# Patient Record
Sex: Female | Born: 1965 | Race: Black or African American | Hispanic: No | Marital: Married | Smoking: Current every day smoker
Health system: Southern US, Community
[De-identification: ages and names within clinical notes are randomized; demographics above are authoritative.]

## PROBLEM LIST (undated history)

## (undated) DIAGNOSIS — D259 Leiomyoma of uterus, unspecified: Secondary | ICD-10-CM

## (undated) DIAGNOSIS — G4733 Obstructive sleep apnea (adult) (pediatric): Secondary | ICD-10-CM

## (undated) DIAGNOSIS — I5030 Unspecified diastolic (congestive) heart failure: Secondary | ICD-10-CM

## (undated) DIAGNOSIS — I10 Essential (primary) hypertension: Secondary | ICD-10-CM

## (undated) DIAGNOSIS — K861 Other chronic pancreatitis: Secondary | ICD-10-CM

## (undated) DIAGNOSIS — R634 Abnormal weight loss: Secondary | ICD-10-CM

## (undated) DIAGNOSIS — R51 Headache: Secondary | ICD-10-CM

## (undated) DIAGNOSIS — D869 Sarcoidosis, unspecified: Secondary | ICD-10-CM

## (undated) DIAGNOSIS — E669 Obesity, unspecified: Secondary | ICD-10-CM

## (undated) DIAGNOSIS — D860 Sarcoidosis of lung: Secondary | ICD-10-CM

## (undated) DIAGNOSIS — E7800 Pure hypercholesterolemia, unspecified: Secondary | ICD-10-CM

## (undated) DIAGNOSIS — I2510 Atherosclerotic heart disease of native coronary artery without angina pectoris: Secondary | ICD-10-CM

## (undated) DIAGNOSIS — R0602 Shortness of breath: Secondary | ICD-10-CM

## (undated) DIAGNOSIS — Z800 Family history of malignant neoplasm of digestive organs: Secondary | ICD-10-CM

## (undated) DIAGNOSIS — E785 Hyperlipidemia, unspecified: Secondary | ICD-10-CM

## (undated) DIAGNOSIS — E119 Type 2 diabetes mellitus without complications: Secondary | ICD-10-CM

## (undated) DIAGNOSIS — I509 Heart failure, unspecified: Secondary | ICD-10-CM

## (undated) HISTORY — DX: Other chronic pancreatitis: K86.1

## (undated) HISTORY — DX: Pure hypercholesterolemia, unspecified: E78.00

## (undated) HISTORY — PX: CORONARY ARTERY BYPASS GRAFT: SHX141

## (undated) HISTORY — DX: Family history of malignant neoplasm of digestive organs: Z80.0

## (undated) HISTORY — DX: Sarcoidosis, unspecified: D86.9

## (undated) HISTORY — DX: Hyperlipidemia, unspecified: E78.5

## (undated) HISTORY — DX: Obesity, unspecified: E66.9

## (undated) HISTORY — PX: BREAST EXCISIONAL BIOPSY: SUR124

## (undated) HISTORY — DX: Abnormal weight loss: R63.4

## (undated) HISTORY — DX: Atherosclerotic heart disease of native coronary artery without angina pectoris: I25.10

## (undated) HISTORY — DX: Leiomyoma of uterus, unspecified: D25.9

## (undated) HISTORY — DX: Sarcoidosis of lung: D86.0

## (undated) HISTORY — PX: BREAST CYST ASPIRATION: SHX578

## (undated) HISTORY — DX: Unspecified diastolic (congestive) heart failure: I50.30

---

## 1995-03-27 HISTORY — PX: TUBAL LIGATION: SHX77

## 1998-09-25 DIAGNOSIS — I2510 Atherosclerotic heart disease of native coronary artery without angina pectoris: Secondary | ICD-10-CM | POA: Insufficient documentation

## 2003-02-09 ENCOUNTER — Other Ambulatory Visit: Payer: Self-pay

## 2003-02-10 ENCOUNTER — Other Ambulatory Visit: Payer: Self-pay

## 2003-03-27 DIAGNOSIS — E118 Type 2 diabetes mellitus with unspecified complications: Secondary | ICD-10-CM | POA: Insufficient documentation

## 2003-08-10 ENCOUNTER — Other Ambulatory Visit: Payer: Self-pay

## 2004-09-29 ENCOUNTER — Inpatient Hospital Stay: Payer: Self-pay | Admitting: Internal Medicine

## 2004-09-29 ENCOUNTER — Other Ambulatory Visit: Payer: Self-pay

## 2004-11-10 ENCOUNTER — Ambulatory Visit: Payer: Self-pay | Admitting: Cardiovascular Disease

## 2005-08-31 ENCOUNTER — Ambulatory Visit: Payer: Self-pay | Admitting: Oncology

## 2005-09-10 LAB — COMPREHENSIVE METABOLIC PANEL
ALT: 38 U/L (ref 0–40)
AST: 35 U/L (ref 0–37)
Albumin: 4 g/dL (ref 3.5–5.2)
BUN: 7 mg/dL (ref 6–23)
CO2: 25 mEq/L (ref 19–32)
Calcium: 10 mg/dL (ref 8.4–10.5)
Chloride: 100 mEq/L (ref 96–112)
Potassium: 2.8 mEq/L — ABNORMAL LOW (ref 3.5–5.3)

## 2005-09-10 LAB — LACTATE DEHYDROGENASE: LDH: 159 U/L (ref 94–250)

## 2005-09-10 LAB — CBC WITH DIFFERENTIAL (CANCER CENTER ONLY)
Eosinophils Absolute: 0.1 10*3/uL (ref 0.0–0.5)
LYMPH#: 2.1 10*3/uL (ref 0.9–3.3)
MONO#: 0.7 10*3/uL (ref 0.1–0.9)
NEUT#: 4.4 10*3/uL (ref 1.5–6.5)
Platelets: 220 10*3/uL (ref 145–400)
RBC: 5.58 10*6/uL — ABNORMAL HIGH (ref 3.70–5.32)
WBC: 7.7 10*3/uL (ref 3.9–10.0)

## 2005-09-20 ENCOUNTER — Ambulatory Visit: Payer: Self-pay | Admitting: Internal Medicine

## 2005-10-11 ENCOUNTER — Ambulatory Visit (HOSPITAL_COMMUNITY): Admission: RE | Admit: 2005-10-11 | Discharge: 2005-10-11 | Payer: Self-pay | Admitting: Thoracic Surgery

## 2005-10-11 ENCOUNTER — Encounter (INDEPENDENT_AMBULATORY_CARE_PROVIDER_SITE_OTHER): Payer: Self-pay | Admitting: *Deleted

## 2005-10-11 ENCOUNTER — Encounter (INDEPENDENT_AMBULATORY_CARE_PROVIDER_SITE_OTHER): Payer: Self-pay | Admitting: Specialist

## 2005-10-19 ENCOUNTER — Ambulatory Visit: Payer: Self-pay | Admitting: Oncology

## 2005-10-22 LAB — CBC WITH DIFFERENTIAL (CANCER CENTER ONLY)
BASO#: 0.1 10e3/uL (ref 0.0–0.2)
BASO%: 0.8 % (ref 0.0–2.0)
EOS%: 1.7 % (ref 0.0–7.0)
Eosinophils Absolute: 0.1 10e3/uL (ref 0.0–0.5)
HCT: 47 % — ABNORMAL HIGH (ref 34.8–46.6)
HGB: 15.6 g/dL (ref 11.6–15.9)
LYMPH#: 2 10e3/uL (ref 0.9–3.3)
LYMPH%: 33.6 % (ref 14.0–48.0)
MCH: 29.4 pg (ref 26.0–34.0)
MCHC: 33.3 g/dL (ref 32.0–36.0)
MCV: 88 fL (ref 81–101)
MONO#: 0.5 10e3/uL (ref 0.1–0.9)
MONO%: 8.2 % (ref 0.0–13.0)
NEUT#: 3.3 10e3/uL (ref 1.5–6.5)
NEUT%: 55.7 % (ref 39.6–80.0)
Platelets: 219 10e3/uL (ref 145–400)
RBC: 5.31 10e6/uL (ref 3.70–5.32)
RDW: 13.2 % (ref 10.5–14.6)
WBC: 5.9 10e3/uL (ref 3.9–10.0)

## 2005-10-22 LAB — COMPREHENSIVE METABOLIC PANEL WITH GFR
ALT: 45 U/L — ABNORMAL HIGH (ref 0–40)
AST: 36 U/L (ref 0–37)
Albumin: 4.3 g/dL (ref 3.5–5.2)
Alkaline Phosphatase: 58 U/L (ref 39–117)
BUN: 12 mg/dL (ref 6–23)
CO2: 24 meq/L (ref 19–32)
Calcium: 10.1 mg/dL (ref 8.4–10.5)
Chloride: 101 meq/L (ref 96–112)
Creatinine, Ser: 0.94 mg/dL (ref 0.40–1.20)
Glucose, Bld: 182 mg/dL — ABNORMAL HIGH (ref 70–99)
Potassium: 3.3 meq/L — ABNORMAL LOW (ref 3.5–5.3)
Sodium: 137 meq/L (ref 135–145)
Total Bilirubin: 0.6 mg/dL (ref 0.3–1.2)
Total Protein: 7.3 g/dL (ref 6.0–8.3)

## 2005-12-20 ENCOUNTER — Other Ambulatory Visit: Payer: Self-pay

## 2005-12-20 ENCOUNTER — Inpatient Hospital Stay: Payer: Self-pay | Admitting: Internal Medicine

## 2005-12-21 ENCOUNTER — Other Ambulatory Visit: Payer: Self-pay

## 2006-03-22 ENCOUNTER — Other Ambulatory Visit: Payer: Self-pay

## 2006-03-22 ENCOUNTER — Inpatient Hospital Stay: Payer: Self-pay | Admitting: Internal Medicine

## 2006-04-14 ENCOUNTER — Inpatient Hospital Stay: Payer: Self-pay | Admitting: Internal Medicine

## 2006-04-14 ENCOUNTER — Other Ambulatory Visit: Payer: Self-pay

## 2006-04-18 ENCOUNTER — Ambulatory Visit: Payer: Self-pay | Admitting: Oncology

## 2006-11-20 ENCOUNTER — Ambulatory Visit: Payer: Self-pay | Admitting: Internal Medicine

## 2006-12-07 ENCOUNTER — Emergency Department: Payer: Self-pay | Admitting: Emergency Medicine

## 2008-11-18 ENCOUNTER — Encounter (INDEPENDENT_AMBULATORY_CARE_PROVIDER_SITE_OTHER): Payer: Self-pay | Admitting: Nurse Practitioner

## 2008-12-02 ENCOUNTER — Encounter (INDEPENDENT_AMBULATORY_CARE_PROVIDER_SITE_OTHER): Payer: Self-pay | Admitting: Nurse Practitioner

## 2008-12-02 DIAGNOSIS — G4733 Obstructive sleep apnea (adult) (pediatric): Secondary | ICD-10-CM | POA: Insufficient documentation

## 2008-12-02 DIAGNOSIS — I519 Heart disease, unspecified: Secondary | ICD-10-CM | POA: Insufficient documentation

## 2008-12-10 ENCOUNTER — Ambulatory Visit: Payer: Self-pay | Admitting: Nurse Practitioner

## 2008-12-10 DIAGNOSIS — F17200 Nicotine dependence, unspecified, uncomplicated: Secondary | ICD-10-CM | POA: Insufficient documentation

## 2008-12-10 DIAGNOSIS — Z720 Tobacco use: Secondary | ICD-10-CM | POA: Insufficient documentation

## 2008-12-10 DIAGNOSIS — E669 Obesity, unspecified: Secondary | ICD-10-CM | POA: Insufficient documentation

## 2008-12-10 DIAGNOSIS — K861 Other chronic pancreatitis: Secondary | ICD-10-CM | POA: Insufficient documentation

## 2008-12-14 ENCOUNTER — Encounter (INDEPENDENT_AMBULATORY_CARE_PROVIDER_SITE_OTHER): Payer: Self-pay | Admitting: Nurse Practitioner

## 2009-01-11 ENCOUNTER — Telehealth (INDEPENDENT_AMBULATORY_CARE_PROVIDER_SITE_OTHER): Payer: Self-pay | Admitting: Nurse Practitioner

## 2009-01-19 ENCOUNTER — Ambulatory Visit: Payer: Self-pay | Admitting: Nurse Practitioner

## 2009-01-19 LAB — CONVERTED CEMR LAB: Hgb A1c MFr Bld: 7.3 %

## 2009-01-27 ENCOUNTER — Ambulatory Visit (HOSPITAL_COMMUNITY): Admission: RE | Admit: 2009-01-27 | Discharge: 2009-01-27 | Payer: Self-pay | Admitting: Internal Medicine

## 2009-01-28 ENCOUNTER — Telehealth (INDEPENDENT_AMBULATORY_CARE_PROVIDER_SITE_OTHER): Payer: Self-pay | Admitting: Nurse Practitioner

## 2009-02-11 ENCOUNTER — Ambulatory Visit: Payer: Self-pay | Admitting: Nurse Practitioner

## 2009-02-11 LAB — CONVERTED CEMR LAB

## 2009-02-12 ENCOUNTER — Encounter (INDEPENDENT_AMBULATORY_CARE_PROVIDER_SITE_OTHER): Payer: Self-pay | Admitting: Nurse Practitioner

## 2009-02-15 ENCOUNTER — Encounter (INDEPENDENT_AMBULATORY_CARE_PROVIDER_SITE_OTHER): Payer: Self-pay | Admitting: Nurse Practitioner

## 2009-02-21 ENCOUNTER — Ambulatory Visit: Payer: Self-pay | Admitting: Nurse Practitioner

## 2009-02-22 ENCOUNTER — Telehealth (INDEPENDENT_AMBULATORY_CARE_PROVIDER_SITE_OTHER): Payer: Self-pay | Admitting: Nurse Practitioner

## 2009-02-22 ENCOUNTER — Encounter (INDEPENDENT_AMBULATORY_CARE_PROVIDER_SITE_OTHER): Payer: Self-pay | Admitting: Nurse Practitioner

## 2009-02-22 DIAGNOSIS — E7800 Pure hypercholesterolemia, unspecified: Secondary | ICD-10-CM | POA: Insufficient documentation

## 2009-02-22 LAB — CONVERTED CEMR LAB
ALT: 29 units/L (ref 0–35)
Alkaline Phosphatase: 49 units/L (ref 39–117)
Basophils Absolute: 0 10*3/uL (ref 0.0–0.1)
Basophils Relative: 0 % (ref 0–1)
CO2: 28 meq/L (ref 19–32)
Cholesterol: 226 mg/dL — ABNORMAL HIGH (ref 0–200)
Creatinine, Ser: 0.93 mg/dL (ref 0.40–1.20)
Eosinophils Absolute: 0.1 10*3/uL (ref 0.0–0.7)
MCHC: 32.6 g/dL (ref 30.0–36.0)
MCV: 88.6 fL (ref 78.0–100.0)
Microalb, Ur: 0.84 mg/dL (ref 0.00–1.89)
Monocytes Relative: 12 % (ref 3–12)
Neutrophils Relative %: 60 % (ref 43–77)
Platelets: 260 10*3/uL (ref 150–400)
RBC: 5.46 M/uL — ABNORMAL HIGH (ref 3.87–5.11)
RDW: 13.7 % (ref 11.5–15.5)
Total Bilirubin: 0.8 mg/dL (ref 0.3–1.2)
Total CHOL/HDL Ratio: 5.8
Triglycerides: 174 mg/dL — ABNORMAL HIGH (ref <150)
VLDL: 35 mg/dL (ref 0–40)

## 2009-04-22 ENCOUNTER — Ambulatory Visit: Payer: Self-pay | Admitting: Nurse Practitioner

## 2009-04-22 LAB — CONVERTED CEMR LAB
Blood in Urine, dipstick: NEGATIVE
Glucose, Urine, Semiquant: NEGATIVE
HDL goal, serum: 40 mg/dL
KOH Prep: NEGATIVE
Ketones, urine, test strip: NEGATIVE

## 2009-04-23 ENCOUNTER — Encounter (INDEPENDENT_AMBULATORY_CARE_PROVIDER_SITE_OTHER): Payer: Self-pay | Admitting: Nurse Practitioner

## 2009-08-02 ENCOUNTER — Telehealth (INDEPENDENT_AMBULATORY_CARE_PROVIDER_SITE_OTHER): Payer: Self-pay | Admitting: Nurse Practitioner

## 2009-08-03 ENCOUNTER — Ambulatory Visit: Payer: Self-pay | Admitting: Nurse Practitioner

## 2009-08-03 ENCOUNTER — Telehealth (INDEPENDENT_AMBULATORY_CARE_PROVIDER_SITE_OTHER): Payer: Self-pay | Admitting: Nurse Practitioner

## 2009-08-03 DIAGNOSIS — K219 Gastro-esophageal reflux disease without esophagitis: Secondary | ICD-10-CM | POA: Insufficient documentation

## 2009-08-12 ENCOUNTER — Ambulatory Visit: Payer: Self-pay | Admitting: Nurse Practitioner

## 2009-08-12 LAB — CONVERTED CEMR LAB
Blood Glucose, Fingerstick: 161
Hgb A1c MFr Bld: 6.5 %

## 2009-08-15 ENCOUNTER — Encounter (INDEPENDENT_AMBULATORY_CARE_PROVIDER_SITE_OTHER): Payer: Self-pay | Admitting: Nurse Practitioner

## 2009-08-16 ENCOUNTER — Ambulatory Visit (HOSPITAL_COMMUNITY): Admission: RE | Admit: 2009-08-16 | Discharge: 2009-08-16 | Payer: Self-pay | Admitting: Internal Medicine

## 2009-08-18 LAB — CONVERTED CEMR LAB
AST: 22 units/L (ref 0–37)
Amylase: 45 units/L (ref 0–105)
BUN: 12 mg/dL (ref 6–23)
Basophils Absolute: 0 10*3/uL (ref 0.0–0.1)
Calcium: 10.3 mg/dL (ref 8.4–10.5)
Chloride: 98 meq/L (ref 96–112)
Creatinine, Ser: 0.8 mg/dL (ref 0.40–1.20)
Eosinophils Absolute: 0.1 10*3/uL (ref 0.0–0.7)
Eosinophils Relative: 1 % (ref 0–5)
HCT: 46.9 % — ABNORMAL HIGH (ref 36.0–46.0)
HDL: 33 mg/dL — ABNORMAL LOW (ref >39)
Hep B Core Total Ab: NEGATIVE
Lymphs Abs: 2.2 10*3/uL (ref 0.7–4.0)
MCV: 87 fL (ref 78.0–100.0)
Platelets: 271 10*3/uL (ref 150–400)
RDW: 13.5 % (ref 11.5–15.5)
Total CHOL/HDL Ratio: 6.6

## 2009-08-19 ENCOUNTER — Telehealth (INDEPENDENT_AMBULATORY_CARE_PROVIDER_SITE_OTHER): Payer: Self-pay | Admitting: Nurse Practitioner

## 2009-08-24 DIAGNOSIS — D259 Leiomyoma of uterus, unspecified: Secondary | ICD-10-CM | POA: Insufficient documentation

## 2009-08-26 ENCOUNTER — Inpatient Hospital Stay (HOSPITAL_COMMUNITY): Admission: RE | Admit: 2009-08-26 | Discharge: 2009-08-27 | Payer: Self-pay | Admitting: Cardiology

## 2009-09-06 ENCOUNTER — Ambulatory Visit: Payer: Self-pay | Admitting: Nurse Practitioner

## 2009-09-06 LAB — CONVERTED CEMR LAB: Blood Glucose, Fingerstick: 131

## 2009-09-07 ENCOUNTER — Encounter (INDEPENDENT_AMBULATORY_CARE_PROVIDER_SITE_OTHER): Payer: Self-pay | Admitting: Nurse Practitioner

## 2009-09-12 ENCOUNTER — Ambulatory Visit (HOSPITAL_COMMUNITY): Admission: RE | Admit: 2009-09-12 | Discharge: 2009-09-12 | Payer: Self-pay | Admitting: Internal Medicine

## 2009-09-13 ENCOUNTER — Telehealth (INDEPENDENT_AMBULATORY_CARE_PROVIDER_SITE_OTHER): Payer: Self-pay | Admitting: Nurse Practitioner

## 2009-09-14 ENCOUNTER — Telehealth (INDEPENDENT_AMBULATORY_CARE_PROVIDER_SITE_OTHER): Payer: Self-pay | Admitting: Nurse Practitioner

## 2009-09-22 ENCOUNTER — Encounter (HOSPITAL_COMMUNITY): Admission: RE | Admit: 2009-09-22 | Discharge: 2009-12-21 | Payer: Self-pay | Admitting: Cardiology

## 2009-09-23 HISTORY — PX: CORONARY ANGIOPLASTY WITH STENT PLACEMENT: SHX49

## 2009-10-04 ENCOUNTER — Telehealth (INDEPENDENT_AMBULATORY_CARE_PROVIDER_SITE_OTHER): Payer: Self-pay | Admitting: Nurse Practitioner

## 2009-10-05 ENCOUNTER — Encounter (INDEPENDENT_AMBULATORY_CARE_PROVIDER_SITE_OTHER): Payer: Self-pay | Admitting: Nurse Practitioner

## 2009-11-11 ENCOUNTER — Ambulatory Visit: Payer: Self-pay | Admitting: Obstetrics & Gynecology

## 2009-12-01 ENCOUNTER — Ambulatory Visit: Payer: Self-pay | Admitting: Obstetrics & Gynecology

## 2009-12-01 ENCOUNTER — Other Ambulatory Visit: Admission: RE | Admit: 2009-12-01 | Discharge: 2009-12-01 | Payer: Self-pay | Admitting: Obstetrics & Gynecology

## 2009-12-19 ENCOUNTER — Telehealth (INDEPENDENT_AMBULATORY_CARE_PROVIDER_SITE_OTHER): Payer: Self-pay | Admitting: Nurse Practitioner

## 2010-01-12 ENCOUNTER — Ambulatory Visit: Payer: Self-pay | Admitting: Nurse Practitioner

## 2010-01-12 LAB — CONVERTED CEMR LAB
Blood Glucose, Fingerstick: 121
Hgb A1c MFr Bld: 6 %

## 2010-01-31 ENCOUNTER — Encounter (INDEPENDENT_AMBULATORY_CARE_PROVIDER_SITE_OTHER): Payer: Self-pay | Admitting: Nurse Practitioner

## 2010-01-31 ENCOUNTER — Ambulatory Visit (HOSPITAL_COMMUNITY)
Admission: RE | Admit: 2010-01-31 | Discharge: 2010-01-31 | Payer: Self-pay | Source: Home / Self Care | Admitting: Internal Medicine

## 2010-03-16 ENCOUNTER — Encounter (INDEPENDENT_AMBULATORY_CARE_PROVIDER_SITE_OTHER): Payer: Self-pay | Admitting: Nurse Practitioner

## 2010-03-16 ENCOUNTER — Ambulatory Visit: Payer: Self-pay | Admitting: Nurse Practitioner

## 2010-03-16 LAB — CONVERTED CEMR LAB
Blood Glucose, Fingerstick: 114
Ketones, urine, test strip: NEGATIVE
Nitrite: NEGATIVE
Specific Gravity, Urine: >=1.03
WBC Urine, dipstick: NEGATIVE

## 2010-03-17 ENCOUNTER — Encounter (INDEPENDENT_AMBULATORY_CARE_PROVIDER_SITE_OTHER): Payer: Self-pay | Admitting: Nurse Practitioner

## 2010-03-17 LAB — CONVERTED CEMR LAB
BUN: 12 mg/dL (ref 6–23)
CO2: 28 meq/L (ref 19–32)
Calcium: 9.9 mg/dL (ref 8.4–10.5)
Chloride: 98 meq/L (ref 96–112)
Creatinine, Ser: 0.72 mg/dL (ref 0.40–1.20)
Glucose, Bld: 87 mg/dL (ref 70–99)
Potassium: 3.4 meq/L — ABNORMAL LOW (ref 3.5–5.3)
Sodium: 140 meq/L (ref 135–145)

## 2010-03-23 ENCOUNTER — Telehealth (INDEPENDENT_AMBULATORY_CARE_PROVIDER_SITE_OTHER): Payer: Self-pay | Admitting: Nurse Practitioner

## 2010-03-23 ENCOUNTER — Ambulatory Visit (HOSPITAL_COMMUNITY)
Admission: RE | Admit: 2010-03-23 | Discharge: 2010-03-23 | Payer: Self-pay | Source: Home / Self Care | Attending: Internal Medicine | Admitting: Internal Medicine

## 2010-03-24 ENCOUNTER — Encounter (INDEPENDENT_AMBULATORY_CARE_PROVIDER_SITE_OTHER): Payer: Self-pay | Admitting: Nurse Practitioner

## 2010-03-24 ENCOUNTER — Ambulatory Visit
Admission: RE | Admit: 2010-03-24 | Discharge: 2010-03-24 | Payer: Self-pay | Source: Home / Self Care | Attending: Nurse Practitioner | Admitting: Nurse Practitioner

## 2010-03-24 ENCOUNTER — Telehealth (INDEPENDENT_AMBULATORY_CARE_PROVIDER_SITE_OTHER): Payer: Self-pay | Admitting: Nurse Practitioner

## 2010-03-24 DIAGNOSIS — R634 Abnormal weight loss: Secondary | ICD-10-CM | POA: Insufficient documentation

## 2010-03-24 DIAGNOSIS — D869 Sarcoidosis, unspecified: Secondary | ICD-10-CM

## 2010-03-24 DIAGNOSIS — D860 Sarcoidosis of lung: Secondary | ICD-10-CM | POA: Insufficient documentation

## 2010-03-24 LAB — CONVERTED CEMR LAB: Blood Glucose, Fingerstick: 236

## 2010-03-31 ENCOUNTER — Telehealth (INDEPENDENT_AMBULATORY_CARE_PROVIDER_SITE_OTHER): Payer: Self-pay | Admitting: Nurse Practitioner

## 2010-04-14 ENCOUNTER — Encounter: Payer: Self-pay | Admitting: Critical Care Medicine

## 2010-04-14 ENCOUNTER — Ambulatory Visit
Admission: RE | Admit: 2010-04-14 | Discharge: 2010-04-14 | Payer: Self-pay | Source: Home / Self Care | Attending: Critical Care Medicine | Admitting: Critical Care Medicine

## 2010-04-14 DIAGNOSIS — J449 Chronic obstructive pulmonary disease, unspecified: Secondary | ICD-10-CM | POA: Insufficient documentation

## 2010-04-15 ENCOUNTER — Encounter: Payer: Self-pay | Admitting: Thoracic Surgery

## 2010-04-16 ENCOUNTER — Encounter: Payer: Self-pay | Admitting: Oncology

## 2010-04-23 LAB — CONVERTED CEMR LAB
Blood Glucose, Fingerstick: 111
Chlamydia, DNA Probe: NEGATIVE
Glucose, Urine, Semiquant: NEGATIVE
KOH Prep: NEGATIVE
Ketones, urine, test strip: NEGATIVE
OCCULT 1: NEGATIVE
Specific Gravity, Urine: >=1.03
pH: 5

## 2010-04-25 NOTE — Progress Notes (Signed)
Summary: Ultrasound results  Phone Note Outgoing Call   Summary of Call: notify pt that ultrasound does show that she has at least 3 fibroids they are moderate in size which means she may be able to manage the cramping medically however would be willing to refer to GYN for evaluation as I know pt was interested in surgical intervention I can't say whether or not she would be a candidate Initial call taken by: Lehman Prom FNP,  September 13, 2009 3:09 PM  Follow-up for Phone Call        spoke with pt she is interested in being referred to GYN for evaluation because she said if she can get the fibroids removed she would like to do that but if they recommend managing medically then she will do that, but would like to be referred. Follow-up by: Levon Hedger,  September 13, 2009 5:04 PM  Additional Follow-up for Phone Call Additional follow up Details #1::        will order referral and send to [facility]. [facility] Additional Follow-up by: Lehman Prom FNP,  September 14, 2009 8:13 AM

## 2010-04-25 NOTE — Letter (Signed)
Summary: [facility]   Imported By: Arta Bruce 09/09/2009 12:15:13  _____________________________________________________________________  External Attachment:    Type:   Image     Comment:   External Document

## 2010-04-25 NOTE — Assessment & Plan Note (Signed)
Summary: Acute - Bacterial Vaginosis   Vital Signs:  Patient profile:   45 year old female Menstrual status:  regular LMP:     04/10/2009 Weight:      180.4 pounds BMI:     32.07 BSA:     1.85 Temp:     96.9 degrees F oral Pulse rate:   80 / minute Pulse rhythm:   regular Resp:     20 per minute BP sitting:   130 / 78  (left arm) Cuff size:   large  Vitals Entered By: Levon Hedger (April 22, 2009 8:52 AM) CC: vaginal discharge with bad odor....cold x 4 days bodyache, constant cough, phlem, irritated throat...needs refill on trutrack strips, Lipid Management Is Patient Diabetic? Yes Pain Assessment Patient in pain? yes     Location: bodyache CBG Result 102 CBG Device ID [id number]  Does patient need assistance? Functional Status Self care Ambulation Normal LMP (date): 04/10/2009 LMP - Character: normal     Menstrual Status regular Enter LMP: 04/10/2009 Last PAP Result  Specimen Adequacy: Satisfactory for evaluation.   Interpretation/Result:Negative for intraepithelial Lesion or Malignancy.      CC:  vaginal discharge with bad odor....cold x 4 days bodyache, constant cough, phlem, irritated throat...needs refill on trutrack strips, and Lipid Management.  History of Present Illness:  Pt into the office with complaints of discharge Started abougt 2 weeks ago +discharge +odorous +yellowish in color Mainly smell is her main concern Admits that she has been drinking plenty of water Menses monthly and discharge started after her last cycle  Cough - started 4 days ago. Cough present on last visit and she was given promethizine/codiene for which she has been taking during this episode and it is not improving. +sore throat +hoarseness -fever -headache still with tobacco use     Lipid Management History:      Positive NCEP/ATP III risk factors include diabetes, HDL cholesterol less than 40, current tobacco user, and ASHD (either angina/prior MI/prior CABG).   Negative NCEP/ATP III risk factors include female age less than 45 years old, no prior stroke/TIA, no peripheral vascular disease, and no history of aortic aneurysm.        The patient states that she does not know about the "Therapeutic Lifestyle Change" diet.  The patient does not know about adjunctive measures for cholesterol lowering.  Comments include: Pt did start the cholesterol meds but reports she would wake up in the morning with headache.  .     Habits & Providers  Alcohol-Tobacco-Diet     Alcohol drinks/day: 0     Alcohol Counseling: not indicated; use of alcohol is not excessive or problematic     Alcohol type: quit 02/2005     Tobacco Status: current     Tobacco Counseling: to quit use of tobacco products     Cigarette Packs/Day: 0.5     Year Started: age 61  Exercise-Depression-Behavior     Does Patient Exercise: no     Have you felt down or hopeless? no     Have you felt little pleasure in things? no     Depression Counseling: not indicated; screening negative for depression     Drug Use: never  Allergies (verified): No Known Drug Allergies  Review of Systems General:  Denies fever. ENT:  Complains of sore throat. CV:  Complains of chest pain or discomfort and fatigue. Resp:  Complains of cough. GU:  Complains of discharge.  Physical Exam  General:  alert.  Head:  normocephalic.   Lungs:  decreased bases but no wheezes Heart:  normal rate and regular rhythm.   Abdomen:  normal bowel sounds.   Genitalia:  self wet prep Msk:  normal ROM.   Neurologic:  alert & oriented X3.   Skin:  color normal.   Psych:  Oriented X3.     Impression & Recommendations:  Problem # 1:  BACTERIAL VAGINITIS (ICD-616.10) handout given advised pt of the dx The following medications were removed from the medication list:    Amoxicillin 500 Mg Caps (Amoxicillin) ..... One capsule by mouth three times a day for infection Her updated medication list for this problem  includes:    Metronidazole 500 Mg Tabs (Metronidazole) .Marland Kitchen... 1 tablet by mouth two times a day for infection  Orders: Wet Prep (14782NF) UA Dipstick w/o Micro (manual) (62130)  Problem # 2:  ACUTE BRONCHITIS (ICD-466.0) advsied pt that she needs to quit smoking advair diskus given advised pt to continue cough meds mucinex otc  The following medications were removed from the medication list:    Amoxicillin 500 Mg Caps (Amoxicillin) ..... One capsule by mouth three times a day for infection Her updated medication list for this problem includes:    Promethazine-codeine 6.25-10 Mg/8ml Syrp (Promethazine-codeine) ..... One teaspoon by mouth every 6 hours as needed for cough    Advair Diskus 250-50 Mcg/dose Aepb (Fluticasone-salmeterol) .Marland Kitchen... 1 inhalation two times a day    Metronidazole 500 Mg Tabs (Metronidazole) .Marland Kitchen... 1 tablet by mouth two times a day for infection  Problem # 3:  TOBACCO ABUSE (ICD-305.1) advised cessation  Problem # 4:  ACUTE CYSTITIS (ICD-595.0) will send urine for culture The following medications were removed from the medication list:    Amoxicillin 500 Mg Caps (Amoxicillin) ..... One capsule by mouth three times a day for infection Her updated medication list for this problem includes:    Metronidazole 500 Mg Tabs (Metronidazole) .Marland Kitchen... 1 tablet by mouth two times a day for infection  Orders: UA Dipstick w/o Micro (manual) (86578) T-Culture, Urine ([id number])  Complete Medication List: 1)  Aspirin 325 Mg Tabs (Aspirin) .... One tablet by mouth daily 2)  Metoprolol Tartrate 50 Mg Tabs (Metoprolol tartrate) .... One tablet by mouth two times a day 3)  Hydrochlorothiazide 25 Mg Tabs (Hydrochlorothiazide) .... One tablet by mouth daily 4)  Metformin Hcl 500 Mg Xr24h-tab (Metformin hcl) .... One tablet by mouth daily for blood sugar 5)  Tramadol Hcl 50 Mg Tabs (Tramadol hcl) .... One tablet by mouth daily as needed for pain 6)  Promethazine-codeine 6.25-10 Mg/55ml  Syrp (Promethazine-codeine) .... One teaspoon by mouth every 6 hours as needed for cough 7)  Pravastatin Sodium 10 Mg Tabs (Pravastatin sodium) .... One tablet by mouth nightly for cholesterol 8)  Advair Diskus 250-50 Mcg/dose Aepb (Fluticasone-salmeterol) .Marland Kitchen.. 1 inhalation two times a day 9)  Metronidazole 500 Mg Tabs (Metronidazole) .Marland Kitchen.. 1 tablet by mouth two times a day for infection 10)  Fluconazole 150 Mg Tabs (Fluconazole) .... One tablet by mouth x 1 dose 11)  Truetrack Test Strp (Glucose blood) .... Use to check blood sugar once daily  Other Orders: Capillary Blood Glucose/CBG (13244)  Lipid Assessment/Plan:      Based on NCEP/ATP III, the patient's risk factor category is "history of coronary disease, peripheral vascular disease, cerebrovascular disease, or aortic aneurysm along with either diabetes, current smoker, or LDL > 130 plus HDL < 40 plus triglycerides > 200".  The patient's lipid goals are  as follows: Total cholesterol goal is 200; LDL cholesterol goal is 70; HDL cholesterol goal is 40; Triglyceride goal is 150.    Patient Instructions: 1)  You do not have the flu. 2)  You have bronchitis due to chronic smoking.  3)  Please Please Please try to stop smoking. 4)  You are coughing because your bronchioles are irritated due to chronic irritation. 5)  Take tylenol as needed for the aches 6)  You can continue to take the cough meds - use 2 teaspoons if it does not make you sleepy. 7)  Mucinex (plain) NOT DM - twice daily would help with the cough. 8)  Use advair 250/50 - 1 inhalation two times a day (rinse mouth after use) 9)  Keep next scheduled appointment for diabetes. Prescriptions: TRUETRACK TEST  STRP (GLUCOSE BLOOD) Use to check blood sugar once daily  #50 x 5   Entered and Authorized by:   Lehman Prom FNP   Signed by:   Lehman Prom FNP on 04/22/2009   Method used:   Print then Give to Patient   RxID:   [id number] FLUCONAZOLE 150 MG TABS (FLUCONAZOLE)  One tablet by mouth x 1 dose  #1 x 0   Entered and Authorized by:   Lehman Prom FNP   Signed by:   Lehman Prom FNP on 04/22/2009   Method used:   Print then Give to Patient   RxID:   [id number] METRONIDAZOLE 500 MG TABS (METRONIDAZOLE) 1 tablet by mouth two times a day for infection  #14 x 0   Entered and Authorized by:   Lehman Prom FNP   Signed by:   Lehman Prom FNP on 04/22/2009   Method used:   Print then Give to Patient   RxID:   [id number] ADVAIR DISKUS 250-50 MCG/DOSE AEPB (FLUTICASONE-SALMETEROL) 1 inhalation two times a day  #1 x 0   Entered and Authorized by:   Lehman Prom FNP   Signed by:   Lehman Prom FNP on 04/22/2009   Method used:   Samples Given   RxID:   [id number]   Laboratory Results   Urine Tests  Date/Time Received: April 22, 2009 9:29 AM   Routine Urinalysis   Color: lt. yellow Glucose: negative   (Normal Range: Negative) Bilirubin: negative   (Normal Range: Negative) Ketone: negative   (Normal Range: Negative) Spec. Gravity: 1.010   (Normal Range: 1.003-1.035) Blood: negative   (Normal Range: Negative) pH: 5.0   (Normal Range: 5.0-8.0) Protein: negative   (Normal Range: Negative) Urobilinogen: 0.2   (Normal Range: 0-1) Nitrite: positive   (Normal Range: Negative) Leukocyte Esterace: trace   (Normal Range: Negative)     Blood Tests     CBG Random:: 102mg /dL  Date/Time Received: April 22, 2009 9:39 AM   [employer] [place] Source: vaginal WBC/hpf: 1-5 Bacteria/hpf: 1+ Clue cells/hpf: moderate Yeast/hpf: none Wet [place] KOH: Negative Trichomonas/hpf: none

## 2010-04-25 NOTE — Letter (Signed)
Summary: Handout Printed  Printed Handout:  - Bronchitis 

## 2010-04-25 NOTE — Progress Notes (Signed)
Summary: Cardiac referral  Phone Note Outgoing Call   Summary of Call: refer to cardiology for possible stress test Hx: CAD with bypass surgery 12 years ago, diabetic currently with chest pain  no changes on EKG   Initial call taken by: Lehman Prom FNP,  Aug 03, 2009 5:04 PM

## 2010-04-25 NOTE — Letter (Signed)
Summary: Handout Printed  Printed Handout:  - Pancreatitis, Acute

## 2010-04-25 NOTE — Progress Notes (Signed)
Summary: Tramadol refill  Phone Note Refill Request Message from:  Patient on October 04, 2009 3:25 PM  Refills Requested: Medication #1:  TRAMADOL HCL 50 MG TABS Two tablets by mouth two times a day as needed for pain   Supply Requested: 1 month   Last Refilled: 08/12/2009 Needs refill. Meds ran out 10/03/09.Marland [facility] [address] [phone])   Method Requested: Electronic Initial call taken by: Janace Aris,  October 04, 2009 3:26 PM  Follow-up for Phone Call        rx printed shiela to fax back to pharmacy notify pt Follow-up by: Lehman Prom FNP,  October 04, 2009 3:54 PM  Additional Follow-up for Phone Call Additional follow up Details #1::        pt notified. Additional Follow-up by: Levon Hedger,  October 04, 2009 5:14 PM    Prescriptions: TRAMADOL HCL 50 MG TABS (TRAMADOL HCL) Two tablets by mouth two times a day as needed for pain  #50 x 0   Entered and Authorized by:   Lehman Prom FNP   Signed by:   Lehman Prom FNP on 10/04/2009   Method used:   Printed then faxed to ...       [facility] [address] (retail)       [phone]       [place], [place]  [address]       Ph: [phone]       Fax: [phone]   RxID:   [id number]

## 2010-04-25 NOTE — Letter (Signed)
Summary: TEST ORDER FORM//CT ??APPT DATE & TIME  TEST ORDER FORM//CT ??APPT DATE & TIME   Imported By: Arta Bruce 08/15/2009 08:35:58  _____________________________________________________________________  External Attachment:    Type:   Image     Comment:   External Document

## 2010-04-25 NOTE — Progress Notes (Signed)
Summary: med refills  Phone Note Call from Patient   Reason for Call: Refill Medication Summary of Call: med refills on Tramadol.  Pt uses [facility] on [address] Initial call taken by: Oscar La,  December 19, 2009 4:39 PM  Follow-up for Phone Call        forward to N. Daphine Deutscher, fnp Follow-up by: Levon Hedger,  December 19, 2009 5:24 PM  Additional Follow-up for Phone Call Additional follow up Details #1::        Rx printed in your basket  fax to [facility] Additional Follow-up by: Lehman Prom FNP,  December 19, 2009 5:47 PM    Additional Follow-up for Phone Call Additional follow up Details #2::    Fax completed.  Dutch Quint RN  December 20, 2009 9:38 AM   Prescriptions: TRAMADOL HCL 50 MG TABS (TRAMADOL HCL) Two tablets by mouth two times a day as needed for pain  #50 x 0   Entered and Authorized by:   Lehman Prom FNP   Signed by:   Lehman Prom FNP on 12/19/2009   Method used:   Printed then faxed to ...       [facility] [address] (retail)       [phone]       [place], [place]  [address]       Ph: [phone]       Fax: [phone]   RxID:   [id number]

## 2010-04-25 NOTE — Letter (Signed)
Summary: [facility]  [facility]   Imported By: Arta Bruce 10/05/2009 10:30:09  _____________________________________________________________________  External Attachment:    Type:   Image     Comment:   External Document

## 2010-04-25 NOTE — Letter (Signed)
Summary: [facility]  [facility]   Imported By: Arta Bruce 08/17/2009 15:33:15  _____________________________________________________________________  External Attachment:    Type:   Image     Comment:   External Document

## 2010-04-25 NOTE — Assessment & Plan Note (Signed)
Summary: Abd Pain   Vital Signs:  Patient profile:   45 year old female Menstrual status:  regular Weight:      178.1 pounds BMI:     31.66 BSA:     1.84 Temp:     97.8 degrees F oral Pulse rate:   76 / minute Pulse rhythm:   regular BP sitting:   113 / 79  (left arm) Cuff size:   large  Vitals Entered By: Levon Hedger (Aug 12, 2009 11:21 AM) CC: follow-up visit 1 week for stomach...stomach medication did not work Is Patient Diabetic? Yes Pain Assessment Patient in pain? yes     Location: stomach CBG Result 161 CBG Device ID A  Does patient need assistance? Functional Status Self care Ambulation Normal   CC:  follow-up visit 1 week for stomach...stomach medication did not work.  History of Present Illness:  Pt into the office for follow up - Seen on last week for chest pain. Chest pain has continued.   Even this week - Pt had an episode this week when she was coming out of [employer].  She was able to make it her car and rested then pain resolved. No sweats No pain radiation "in the past 3 weeks, I have spent more time on my butt then off my butt" Even her day to day routine of walking to the mailbox and going to the car is making her tired. Pt has an appointment with cardiology on May 23rd, 2011 at Dr. Amil Amen.   Diarrhea - started 3 days ago.   She has been taking OTC peptobismol  unable to recall any foods or triggers that started the diarrhea Pt is on metformin and has been taking once daily even with diarrhea.  Reports very few missed doses. Blood sugars have actually been some elevated since recent diarrha bout started  Hx chronic pancreatitis - Right upper and lower quadrant pain, pt has been taking ibuprofen for the pain, multiple OTC meds.           Medications Prior to Update: 1)  Aspirin 325 Mg Tabs (Aspirin) .... One Tablet By Mouth Daily 2)  Metoprolol Tartrate 50 Mg Tabs (Metoprolol Tartrate) .... One Tablet By Mouth Two Times A Day 3)   Hydrochlorothiazide 25 Mg Tabs (Hydrochlorothiazide) .... One Tablet By Mouth Daily 4)  Metformin Hcl 500 Mg Xr24h-Tab (Metformin Hcl) .... One Tablet By Mouth Daily For Blood Sugar 5)  Tramadol Hcl 50 Mg Tabs (Tramadol Hcl) .... One Tablet By Mouth Daily As Needed For Pain 6)  Pravastatin Sodium 10 Mg Tabs (Pravastatin Sodium) .... One Tablet By Mouth Nightly For Cholesterol 7)  Advair Diskus 250-50 Mcg/dose Aepb (Fluticasone-Salmeterol) .Marland Kitchen.. 1 Inhalation Two Times A Day 8)  Truetrack Test  Strp (Glucose Blood) .... Use To Check Blood Sugar Once Daily 9)  Omeprazole 20 Mg Tbec (Omeprazole) .... One Tablet By Mouth Before Breakfast and Before Dinner  Allergies (verified): No Known Drug Allergies  Review of Systems CV:  Complains of chest pain or discomfort and fatigue. Resp:  Denies cough. GI:  Complains of abdominal pain and diarrhea; denies nausea and vomiting; ate oatmeal this morning and was able to keep down without diarrhea.  Physical Exam  General:  alert.   Head:  normocephalic.   Chest Wall:  midsternal healed incision Lungs:  normal breath sounds.   Heart:  normal rate and regular rhythm.   Abdomen:  tenderness with palpation of right upper and lower quadrant   Impression &  Recommendations:  Problem # 1:  CHEST PAIN (ICD-786.50) pt has an appt with cardiology next week  Problem # 2:  ABDOMINAL PAIN, GENERALIZED (ICD-789.07) will order Chest CT  Orders: CT with & without Contrast (CT w&w/o Contrast) T- * Misc. Laboratory test [id number])  Problem # 3:  DIABETES MELLITUS, TYPE II (ICD-250.00) controlled  will have pt hold metformin until after CT Her updated medication list for this problem includes:    Aspirin 325 Mg Tabs (Aspirin) ..... One tablet by mouth daily    Metformin Hcl 500 Mg Xr24h-tab (Metformin hcl) ..... One tablet by mouth daily for blood sugar  Orders: Capillary Blood Glucose/CBG (95621) Hemoglobin A1C (30865) T-Comprehensive Metabolic Panel  ([id number]) T-CBC w/Diff ([id number]) T-TSH (27253-66440)  Problem # 4:  PANCREATITIS, CHRONIC (ICD-577.1) will order chest CT if chronic then amylase and lipase most likely will not be elevated Orders: CT with & without Contrast (CT w&w/o Contrast) T-Amylase ([id number]) T-Lipase ([id number])  Complete Medication List: 1)  Aspirin 325 Mg Tabs (Aspirin) .... One tablet by mouth daily 2)  Metoprolol Tartrate 50 Mg Tabs (Metoprolol tartrate) .... One tablet by mouth two times a day 3)  Hydrochlorothiazide 25 Mg Tabs (Hydrochlorothiazide) .... One tablet by mouth daily 4)  Metformin Hcl 500 Mg Xr24h-tab (Metformin hcl) .... One tablet by mouth daily for blood sugar 5)  Tramadol Hcl 50 Mg Tabs (Tramadol hcl) .... Two tablets by mouth two times a day as needed for pain 6)  Pravastatin Sodium 10 Mg Tabs (Pravastatin sodium) .... One tablet by mouth nightly for cholesterol 7)  Advair Diskus 250-50 Mcg/dose Aepb (Fluticasone-salmeterol) .Marland Kitchen.. 1 inhalation two times a day 8)  Truetrack Test Strp (Glucose blood) .... Use to check blood sugar once daily 9)  Omeprazole 20 Mg Tbec (Omeprazole) .... One tablet by mouth before breakfast and before dinner  Patient Instructions: 1)  Keep CT appointment scheduled for Monday 2)  Go pick up contrast today 3)  This will check to see what your pancreas and liver is doing and if this is the source of discomfort. 4)  Will not give any medications for pain that would aggravate the problem until results are reviewed 5)  Take tramadol 50mg  - 2 tablets by mouth two times a day as needed for pain 6)  Also keep cardiology appointment for Monday 7)  Stop taking ibuprofen 8)  Stop Metformin - Start back on Wednesday May 25th 9)  Diarrhea - Avoid fried fatty foods over the weekend 10)  Eat soft bland foods - soup, baked chicken, rice, bananas 11)  Follow up with n.martin,fnp in 2 weeks for CT review Prescriptions: TRAMADOL HCL 50 MG TABS (TRAMADOL HCL) Two  tablets by mouth two times a day as needed for pain  #50 x 0   Entered and Authorized by:   Lehman Prom FNP   Signed by:   Lehman Prom FNP on 08/12/2009   Method used:   Print then Give to Patient   RxID:   [id number]   Laboratory Results   Blood Tests     HGBA1C: 6.5%   (Normal Range: Non-Diabetic - 3-6%   Control Diabetic - 6-8%) CBG Random:: 161mg /dL

## 2010-04-25 NOTE — Letter (Signed)
Summary: ULTRASOUND//APPT DATE & TIME  ULTRASOUND//APPT DATE & TIME   Imported By: Arta Bruce 09/06/2009 14:31:06  _____________________________________________________________________  External Attachment:    Type:   Image     Comment:   External Document

## 2010-04-25 NOTE — Assessment & Plan Note (Signed)
Summary: Diabetes/Weight loss   Vital Signs:  Patient profile:   45 year old female Menstrual status:  regular Weight:      168.1 pounds Temp:     97.1 degrees F oral Pulse rate:   72 / minute Pulse rhythm:   regular Resp:     16 per minute BP sitting:   125 / 80  (left arm) Cuff size:   regular  Vitals Entered By: Levon Hedger (January 12, 2010 10:57 AM) CC: concerned about weight loss and she is not changed any eating habits., Abdominal Pain Is Patient Diabetic? Yes Pain Assessment Patient in pain? no      CBG Result 121  Does patient need assistance? Functional Status Self care Ambulation Normal   CC:  concerned about weight loss and she is not changed any eating habits. and Abdominal Pain.  History of Present Illness:  Pt into the office for f/u.  IUD placed by [facility] in September.  She reports that she has cramping and discharge is frequent.  Admits that flow has lessened but is mostly daily.  Obesity - pt is concerned with weight loss.  She has lost 10 pounds since her visit here in June. Pt reports that eating habits have NOT changed. No meaningful exercise however since her Cardiac cath she is able to do more walking   Diabetes Management History:      The patient is a 45 years old female who comes in for evaluation of DM Type 2.  She is (or has been) enrolled in the "Diabetic Education Program".  She states lack of understanding of dietary principles and is not following her diet appropriately.  No sensory loss is reported.  Self foot exams are not being performed.  She is checking home blood sugars.  She says that she is not exercising regularly.        Hypoglycemic symptoms are not occurring.  No hyperglycemic symptoms are reported.  Other comments include: Pt is checking blood sugar at home and states that is usually less than 130.        There are no symptoms to suggest diabetic complications.  No changes have been made to her treatment plan since last  visit.    Dyspepsia History:      She has no alarm features of dyspepsia including no history of melena, hematochezia, dysphagia, persistent vomiting, or involuntary weight loss > 5%.  There is a prior history of GERD.  The patient does not have a prior history of documented ulcer disease.  The dominant symptom is not heartburn or acid reflux.  An H-2 blocker medication is not currently being taken.  She has no history of a positive H. Pylori serology.  No previous upper endoscopy has been done.     Habits & Providers  Alcohol-Tobacco-Diet     Alcohol drinks/day: 0     Alcohol Counseling: not indicated; use of alcohol is not excessive or problematic     Alcohol type: quit 02/2005     Tobacco Status: current     Tobacco Counseling: to quit use of tobacco products     Cigarette Packs/Day: 0.5     Year Started: age 36  Exercise-Depression-Behavior     Does Patient Exercise: no     Have you felt down or hopeless? no     Have you felt little pleasure in things? no     Depression Counseling: not indicated; screening negative for depression     Drug Use: never  Allergies (verified): No Known Drug Allergies  Review of Systems General:  Denies fever. CV:  Denies chest pain or discomfort. Resp:  Denies cough. GI:  Denies abdominal pain, nausea, and vomiting. MS:  Denies low back pain.  Physical Exam  General:  alert.   Head:  normocephalic.   Lungs:  normal breath sounds.   Heart:  normal rate and regular rhythm.   Abdomen:  normal bowel sounds.   Neurologic:  alert & oriented X3.     Impression & Recommendations:  Problem # 1:  DIABETES MELLITUS, TYPE II (ICD-250.00)   Her updated medication list for this problem includes:    Aspirin 325 Mg Tabs (Aspirin) ..... One tablet by mouth daily    Metformin Hcl 500 Mg Xr24h-tab (Metformin hcl) ..... One tablet by mouth daily for blood sugar  Orders: Capillary Blood Glucose/CBG (82948) Hemoglobin A1C (83036)  Problem # 2:  NEED  PROPHYLACTIC VACCINATION&INOCULATION FLU (ICD-V04.81) given in office today  Problem # 3:  HYPERCHOLESTEROLEMIA (ICD-272.0) advsied to continue meds Her updated medication list for this problem includes:    Pravastatin Sodium 20 Mg Tabs (Pravastatin sodium) ..... One tablet by mouth nightly for cholesterol  Problem # 4:  OBESITY (ICD-278.00) weight loss of 10 pounds in past 4 months advised pt that she needed to lose weight.  Complete Medication List: 1)  Aspirin 325 Mg Tabs (Aspirin) .... One tablet by mouth daily 2)  Metoprolol Tartrate 50 Mg Tabs (Metoprolol tartrate) .... One tablet by mouth two times a day 3)  Hydrochlorothiazide 25 Mg Tabs (Hydrochlorothiazide) .... One tablet by mouth daily 4)  Metformin Hcl 500 Mg Xr24h-tab (Metformin hcl) .... One tablet by mouth daily for blood sugar 5)  Tramadol Hcl 50 Mg Tabs (Tramadol hcl) .... Two tablets by mouth two times a day as needed for pain 6)  Pravastatin Sodium 20 Mg Tabs (Pravastatin sodium) .... One tablet by mouth nightly for cholesterol 7)  Advair Diskus 250-50 Mcg/dose Aepb (Fluticasone-salmeterol) .Marland Kitchen.. 1 inhalation two times a day 8)  Truetrack Test Strp (Glucose blood) .... Use to check blood sugar once daily 9)  Omeprazole 20 Mg Tbec (Omeprazole) .... One tablet by mouth before breakfast and before dinner 10)  Plavix 75 Mg Tabs (Clopidogrel bisulfate) .... One tablet by mouth daily  Other Orders: Flu Vaccine 63yrs + (16109) Admin 1st Vaccine (60454)  Diabetes Management Assessment/Plan:      The following lipid goals have been established for the patient: Total cholesterol goal of 200; LDL cholesterol goal of 70; HDL cholesterol goal of 40; Triglyceride goal of 150.  Her blood pressure goal is < 130/80.    Patient Instructions: 1)  You have received the flu vaccine today. 2)  You have lost 10 pounds since June 2010. 3)  Your ideal weight should be between 150-155. 4)  The concern should be if you lose 10 more pounds  between now and January. 5)  Remember your mammogram is due in November.  This office can schedule that appointment if you so desire. 6)  Follow up as needed   Orders Added: 1)  Flu Vaccine 61yrs + [90658] 2)  Admin 1st Vaccine [90471] 3)  Est. Patient Level III [09811] 4)  Capillary Blood Glucose/CBG [82948] 5)  Hemoglobin A1C [83036]   Immunizations Administered:  Influenza Vaccine # 1:    Vaccine Type: Fluvax 3+    Site: left deltoid    Mfr: GlaxoSmithKline    Dose: 0.5 ml    Route: IM  Given by: Levon Hedger    Exp. Date: 09/23/2010    Lot #: [id number]    VIS given: 10/18/09 version given January 12, 2010.  Flu Vaccine Consent Questions:    Do you have a history of severe allergic reactions to this vaccine? no    Any prior history of allergic reactions to egg and/or gelatin? no    Do you have a sensitivity to the preservative Thimersol? no    Do you have a past history of Guillan-Barre Syndrome? no    Do you currently have an acute febrile illness? no    Have you ever had a severe reaction to latex? no    Vaccine information given and explained to patient? yes    Are you currently pregnant? no    ndc [phone]  Immunizations Administered:  Influenza Vaccine # 1:    Vaccine Type: Fluvax 3+    Site: left deltoid    Mfr: GlaxoSmithKline    Dose: 0.5 ml    Route: IM    Given by: Levon Hedger    Exp. Date: 09/23/2010    Lot #: [id number]    VIS given: 10/18/09 version given January 12, 2010.   Prevention & Chronic Care Immunizations   Influenza vaccine: Fluvax 3+  (01/12/2010)    Tetanus booster: 03/26/2005: historical per pt    Pneumococcal vaccine: Pneumovax  (12/10/2008)  Other Screening   Pap smear:  Specimen Adequacy: Satisfactory for evaluation.   Interpretation/Result:Negative for intraepithelial Lesion or Malignancy.     (02/11/2009)   Pap smear action/deferral: Ordered  (02/11/2009)    Mammogram: No specific mammographic evidence of  malignancy.  Assessment: BIRADS 1.   (01/27/2009)   Mammogram action/deferral: Deferred  (02/11/2009)   Smoking status: current  (01/12/2010)   Smoking cessation counseling: yes  (02/11/2009)  Diabetes Mellitus   HgbA1C: 6.0  (01/12/2010)   HgbA1C action/deferral: Deferred  (02/11/2009)   Hemoglobin A1C due: 04/21/2009    Eye exam: Not documented   Diabetic eye exam action/deferral: Deferred  (02/11/2009)    Foot exam: yes  (02/11/2009)   Foot exam action/deferral: Do today   High risk foot: Not documented   Foot care education: Done  (12/10/2008)    Urine microalbumin/creatinine ratio: Not documented   Urine microalbumin action/deferral: Ordered  Lipids   Total Cholesterol: 217  (08/12/2009)   Lipid panel action/deferral: Lipid Panel ordered   LDL: 122  (08/12/2009)   LDL Direct: Not documented   HDL: 33  (08/12/2009)   Triglycerides: 312  (08/12/2009)    SGOT (AST): 22  (08/12/2009)   SGPT (ALT): 25  (08/12/2009)   Alkaline phosphatase: 47  (08/12/2009)   Total bilirubin: 0.5  (08/12/2009)  Self-Management Support :    Diabetes self-management support: Not documented    Lipid self-management support: Not documented    Nursing Instructions: Give Flu vaccine today    Laboratory Results   Blood Tests   Date/Time Received: January 12, 2010 11:50 AM   HGBA1C: 6.0%   (Normal Range: Non-Diabetic - 3-6%   Control Diabetic - 6-8%) CBG Random:: 121mg /dL      Appended Document: Mammo order Orders for Mammo faxed to [facility] per there request. Gaylyn Cheers RN  February 01, 2010 10:22 AM    Clinical Lists Changes  Orders: Added new Referral order of Radiology Referral (Radiology) - Signed

## 2010-04-25 NOTE — Letter (Signed)
Summary: REFERRAL//MAMMOGRAM  REFERRAL//MAMMOGRAM   Imported By: Arta Bruce 02/01/2010 11:08:59  _____________________________________________________________________  External Attachment:    Type:   Image     Comment:   External Document

## 2010-04-25 NOTE — Letter (Signed)
Summary: Handout Printed  Printed Handout:  - Bacterial Vaginosis (BV) 

## 2010-04-25 NOTE — Progress Notes (Signed)
Summary: (ACUTE) CHEST PAINS  Phone Note Call from Patient Call back at Home Phone [phone]   Reason for Call: Acute Illness Summary of Call: [facility] PT. MS TUCKER STOPPED BY TO MAKE APPT. BECAUSE SHE HAS BEEN HAVING CHEST PAINS, AND THEY STOP HER IN HER TRACKS. SHE SAYS THAT SHE HAD TRIPLE BYPASS 10 YEARS AND SHE IS THINKING SHE WILL NEED ANOTHER STRESS TEST AGAIN. SHE IS REALLY CONCERNED. Initial call taken by: Leodis Rains,  Aug 02, 2009 12:43 PM  Follow-up for Phone Call        offer pt an appt on tomorrow during afternoon acute slots Follow-up by: Lehman Prom FNP,  Aug 02, 2009 2:19 PM  Additional Follow-up for Phone Call Additional follow up Details #1::        PATIENT SCHEDULED FOR THIS [facility] 05/11. Additional Follow-up by: Leodis Rains,  Aug 02, 2009 3:58 PM

## 2010-04-25 NOTE — Progress Notes (Signed)
Summary: GYN referral  Phone Note Outgoing Call   Summary of Call: GYN referral - Abominal pain, heavy menses recent ultrasound shows fibroids (full report in EMR) Intramural uterine leiomyomas are seen measuring 2.6 x 2.1 x 2.5 cm, 1.3 x 1.0 x 1.3 cm, and 1.6 x 1.4 x 1.3 cm respectively. Initial call taken by: Lehman Prom FNP,  September 14, 2009 8:14 AM

## 2010-04-25 NOTE — Progress Notes (Signed)
Summary: CT results/ test strip refill  Phone Note Call from Patient   Summary of Call: pt needs refills on her true track test strips.  She says that she is out of her pain medication.  She has an appt to f/u on Friday 6/3 but will not be able to keep that appt because she will have her catherization done on that day and she wants to know her CT results Initial call taken by: Levon Hedger,  Aug 19, 2009 4:56 PM  Follow-up for Phone Call        spoke with pt via phone about the CT results she will have cath done on this week  Selena Batten, reschedule pt appointment scheduled on this Friday until next week. [email or web address]  August 24, 2009  12:24 PM   Follow-up by: Lehman Prom FNP,  August 24, 2009 12:19 PM  Additional Follow-up for Phone Call Additional follow up Details #1::        PATIENT IS AWARE OF TEST STRIPS BEING SENT TO PHARMACY AND SHE IS ALSO RESCHEDULED TO 06/14. Additional Follow-up by: Leodis Rains,  August 25, 2009 11:13 AM  New Problems: FIBROIDS, UTERUS (ICD-218.9)   New Problems: FIBROIDS, UTERUS (ICD-218.9) Prescriptions: TRUETRACK TEST  STRP (GLUCOSE BLOOD) Use to check blood sugar once daily  #50 x 5   Entered and Authorized by:   Lehman Prom FNP   Signed by:   Lehman Prom FNP on 08/24/2009   Method used:   Faxed to ...       [facility] (retail)       [address] [place], [place]  [address]       Ph: [phone]       Fax: [phone]   RxID:   [id number]

## 2010-04-25 NOTE — Assessment & Plan Note (Signed)
Summary: Chest pain   Vital Signs:  Patient profile:   45 year old female Menstrual status:  regular Weight:      178.11 pounds BMI:     31.66 BSA:     1.84 Temp:     98.1 degrees F oral Pulse rate:   73 / minute Pulse rhythm:   regular Resp:     20 per minute BP sitting:   114 / 78  (left arm) Cuff size:   large  Vitals Entered By: Levon Hedger (Aug 03, 2009 4:02 PM) CC: sharp chest pains 4-5 times a day x 1 week pt thinks she needs to see a cardiologist....pain in the middle of lower abdomen that moves from side to side x 1 month Is Patient Diabetic? Yes Pain Assessment Patient in pain? no       Does patient need assistance? Functional Status Self care Ambulation Normal   CC:  sharp chest pains 4-5 times a day x 1 week pt thinks she needs to see a cardiologist....pain in the middle of lower abdomen that moves from side to side x 1 month.  History of Present Illness:  Pt into the office with complaints of chest pain started 2 weeks ago increasing in frequency over the past 2 weeks sharp in nature may occur at any time, with exertion or with sitting. midsternal  once pain starts she starts panting (taking short burst of breaths) and it usually goes away in 3-4 minutes +fatigue all the time +SOB with exertion during daily routine Last stress test was 3 years ago with cardiology in [place] PMH: CAD with bypass surgery 12 years ago  Some C/o GI problems that started mid-abdomen and then radiates to right abdomen fried foods about once per week pizza last week  Tobacco abuse - still ongoing.  Habits & Providers  Alcohol-Tobacco-Diet     Alcohol drinks/day: 0     Alcohol Counseling: not indicated; use of alcohol is not excessive or problematic     Alcohol type: quit 02/2005     Tobacco Status: current     Tobacco Counseling: to quit use of tobacco products     Cigarette Packs/Day: 0.5     Year Started: age 89  Exercise-Depression-Behavior     Does  Patient Exercise: no     Depression Counseling: not indicated; screening negative for depression     Drug Use: never  Allergies (verified): No Known Drug Allergies  Review of Systems CV:  Complains of chest pain or discomfort, fatigue, and swelling of hands; denies swelling of feet. Resp:  Complains of shortness of breath. GI:  Complains of indigestion; denies abdominal pain, nausea, and vomiting.  Physical Exam  General:  alert.   Head:  normocephalic.   Lungs:  normal breath sounds.   Heart:  normal rate and regular rhythm.   Abdomen:  normal bowel sounds.   Msk:  up to the exam table Neurologic:  alert & oriented X3.   Skin:  midsternal  healed incision Psych:  Oriented X3.     Impression & Recommendations:  Problem # 1:  CHEST PAIN (ICD-786.50)  will refer to cardiology based on current symptoms and PMH  Orders: Cardiology Referral (Cardiology)  Problem # 2:  ACID REFLUX DISEASE (ICD-530.81) advised pt of foods to avoid  will start omeprazole Her updated medication list for this problem includes:    Omeprazole 20 Mg Tbec (Omeprazole) ..... One tablet by mouth before breakfast and before dinner  Complete Medication List:  1)  Aspirin 325 Mg Tabs (Aspirin) .... One tablet by mouth daily 2)  Metoprolol Tartrate 50 Mg Tabs (Metoprolol tartrate) .... One tablet by mouth two times a day 3)  Hydrochlorothiazide 25 Mg Tabs (Hydrochlorothiazide) .... One tablet by mouth daily 4)  Metformin Hcl 500 Mg Xr24h-tab (Metformin hcl) .... One tablet by mouth daily for blood sugar 5)  Tramadol Hcl 50 Mg Tabs (Tramadol hcl) .... One tablet by mouth daily as needed for pain 6)  Pravastatin Sodium 10 Mg Tabs (Pravastatin sodium) .... One tablet by mouth nightly for cholesterol 7)  Advair Diskus 250-50 Mcg/dose Aepb (Fluticasone-salmeterol) .Marland Kitchen.. 1 inhalation two times a day 8)  Truetrack Test Strp (Glucose blood) .... Use to check blood sugar once daily 9)  Omeprazole 20 Mg Tbec  (Omeprazole) .... One tablet by mouth before breakfast and before dinner  Patient Instructions: 1)  Acid Reflux - start omeprazole by mouth daily before breakfast and dinner for one week then take only before dinner. 2)  You will need referral to cardiology given your recent symptoms and based on your history. 3)  Follow up in one week for chest/abdominal pain Prescriptions: OMEPRAZOLE 20 MG TBEC (OMEPRAZOLE) One tablet by mouth before breakfast and before dinner  #60 x 0   Entered and Authorized by:   Lehman Prom FNP   Signed by:   Lehman Prom FNP on 08/03/2009   Method used:   Print then Give to Patient   RxID:   [id number]

## 2010-04-25 NOTE — Assessment & Plan Note (Signed)
Summary: F/u CT results   Vital Signs:  Patient profile:   45 year old female Menstrual status:  regular Weight:      178.5 pounds BMI:     31.73 Temp:     97.5 degrees F oral Pulse rate:   74 / minute Pulse rhythm:   regular Resp:     20 per minute BP sitting:   104 / 73  (left arm) Cuff size:   regular  Vitals Entered By: Levon Hedger (September 06, 2009 12:10 PM) CC: follow-up visit for test results...Marland Kitchenfibroid pain and cramps Is Patient Diabetic? Yes Pain Assessment Patient in pain? no      CBG Result 131 CBG Device ID B  Does patient need assistance? Functional Status Self care Ambulation Normal   CC:  follow-up visit for test results...Marland Kitchenfibroid pain and cramps.  History of Present Illness:  Pt into the office for follow up. Pt was referred to cardiology by this provider.  She was c/o shortness and breath and fatigue. Pt went as ordered and had a cardiac cath. percutaneous coronary intervention of the third marginal branch - Stent placed pt has a f/u appt with cardiology on tomorrow She has started plavix as ordered (obtained from [facility])   Ct of abd/pelvis done 08/16/2009: Pt was called by this office with the results.  ? Abd pain, cramping, bleeding ? probable uterine fibroid - pt is requesting additional follow up to see the size of fibriods.   She would like surgerical intervention if possible  1 child, age 51 Divorced, currently in relationship     Habits & Providers  Alcohol-Tobacco-Diet     Alcohol drinks/day: 0     Alcohol Counseling: not indicated; use of alcohol is not excessive or problematic     Alcohol type: quit 02/2005     Tobacco Status: current     Tobacco Counseling: to quit use of tobacco products     Cigarette Packs/Day: 0.5     Year Started: age 44  Exercise-Depression-Behavior     Does Patient Exercise: no     Depression Counseling: not indicated; screening negative for depression     Drug Use: never  Allergies  (verified): No Known Drug Allergies  Review of Systems General:  Denies fever. CV:  Denies chest pain or discomfort. Resp:  Denies cough. GI:  Complains of abdominal pain; denies vomiting and vomiting blood.  Physical Exam  General:  alert.   Head:  normocephalic.   Lungs:  normal breath sounds.   Heart:  normal rate and regular rhythm.     Impression & Recommendations:  Problem # 1:  FIBROIDS, UTERUS (ICD-218.9) will order vaginal u/s then decide if surgery is indicated or medical management Orders: Ultrasound (Ultrasound)  Problem # 2:  TOBACCO ABUSE (ICD-305.1) advised pt to quit smoking  Problem # 3:  CAD (ICD-414.00) f/u with cardiology as ordered Her updated medication list for this problem includes:    Aspirin 325 Mg Tabs (Aspirin) ..... One tablet by mouth daily    Metoprolol Tartrate 50 Mg Tabs (Metoprolol tartrate) ..... One tablet by mouth two times a day    Hydrochlorothiazide 25 Mg Tabs (Hydrochlorothiazide) ..... One tablet by mouth daily  Complete Medication List: 1)  Aspirin 325 Mg Tabs (Aspirin) .... One tablet by mouth daily 2)  Metoprolol Tartrate 50 Mg Tabs (Metoprolol tartrate) .... One tablet by mouth two times a day 3)  Hydrochlorothiazide 25 Mg Tabs (Hydrochlorothiazide) .... One tablet by mouth daily 4)  Metformin Hcl 500 Mg Xr24h-tab (Metformin hcl) .... One tablet by mouth daily for blood sugar 5)  Tramadol Hcl 50 Mg Tabs (Tramadol hcl) .... Two tablets by mouth two times a day as needed for pain 6)  Pravastatin Sodium 20 Mg Tabs (Pravastatin sodium) .... One tablet by mouth nightly for cholesterol 7)  Advair Diskus 250-50 Mcg/dose Aepb (Fluticasone-salmeterol) .Marland Kitchen.. 1 inhalation two times a day 8)  Truetrack Test Strp (Glucose blood) .... Use to check blood sugar once daily 9)  Omeprazole 20 Mg Tbec (Omeprazole) .... One tablet by mouth before breakfast and before dinner  Other Orders: Capillary Blood Glucose/CBG (16109)  Patient  Instructions: 1)  Keep your appointment for vaginal ultrasound 2)  Then we will decide if you need to go to GYN. 3)  Keep your appointment with cardiology as ordered

## 2010-04-27 ENCOUNTER — Ambulatory Visit: Admit: 2010-04-27 | Payer: Self-pay | Admitting: Critical Care Medicine

## 2010-04-27 NOTE — Progress Notes (Signed)
Summary: Need Office visit  Phone Note Outgoing Call   Summary of Call: I need to see this pt tomorrow regarding CT results Initial call taken by: Lehman Prom FNP,  March 23, 2010 6:10 PM  Follow-up for Phone Call        Appt. 03/24/10.  Dutch Quint RN  March 23, 2010 6:14 PM

## 2010-04-27 NOTE — Letter (Signed)
Summary: Handout Printed  Printed Handout:  - Sarcoidosis, Schaumann's Disease, Sarcoid of [place] 

## 2010-04-27 NOTE — Letter (Signed)
Summary: MED/SOLUTIONS//APPROVED/CT CHEST  MED/SOLUTIONS//APPROVED/CT CHEST   Imported By: Arta Bruce 03/23/2010 08:47:20  _____________________________________________________________________  External Attachment:    Type:   Image     Comment:   External Document

## 2010-04-27 NOTE — Letter (Signed)
Summary: MED/SOLUTIONS//DENIED  MED/SOLUTIONS//DENIED   Imported By: Arta Bruce 03/21/2010 10:07:36  _____________________________________________________________________  External Attachment:    Type:   Image     Comment:   External Document

## 2010-04-27 NOTE — Letter (Signed)
Summary: TEST ORDER FORM CT  TEST ORDER FORM CT   Imported By: Arta Bruce 03/28/2010 15:10:21  _____________________________________________________________________  External Attachment:    Type:   Image     Comment:   External Document

## 2010-04-27 NOTE — Assessment & Plan Note (Addendum)
Summary: Pulmonary Consultation   Copy to:  Lehman Prom, FNP Primary Provider/Referring Provider:  Shirlean Schlein, FNP  CC:  Pulmonary Consult - sarcoidosis.Marland Kitchen  History of Present Illness: Pulmonary Consultation 46 yo AAF ? sarcoidosis       The patient comes in today for an initial general pulmonary consultation.  The patient complains of cough and coughing up sputum, but denies coughing up blood, shortness of breath, shortness of breath with exertion, wheezing, chest congestion, chest pain at rest, chest pain with exertion, chest pain with inspiration/coughing, PND, orthopnea, pedal edema, sinus pressure, headache, nasal congestion, excessive phlegm production, sore throat, heartburn, hoarseness, trouble swallowing, fever, fatigue, excessive snoring, daytime hypersomnolence, gasping, choking, non-restorative sleep, and insomnia.  The patient describes their cough as non-productive and with clear sputum.  Evaluation to date has included CT scan of chest.    There is a ?sarcoidosis.   Pt notes a few years ago had abn CXR. Pt with mediastinal LAN but mediastinoscopy bx  12yrs ago.  Bx neg for sarcoid. since that time serial CXRs done:  abn.   Pt now on CT  reveals  mediastinal and hilar LAN and upper lobe scar  Pt now notes symptoms:  she is ot dyspneic.  min cough in am to get up phlegm and is clear and white  no chest pain.  no joint issues notes weight loss 2# per month since june/2011 no pfts yet done  pt is active smoker   Preventive Screening-Counseling & Management  Alcohol-Tobacco     Smoking Status: current     Smoking Cessation Counseling: yes     Smoke Cessation Stage: precontemplative     Packs/Day: 1.5  Current Medications (verified): 1)  Aspirin 325 Mg Tabs (Aspirin) .... One Tablet By Mouth Daily 2)  Metoprolol Tartrate 50 Mg Tabs (Metoprolol Tartrate) .... One Tablet By Mouth Two Times A Day 3)  Hydrochlorothiazide 25 Mg Tabs (Hydrochlorothiazide) .... One Tablet  By Mouth Daily 4)  Metformin Hcl 500 Mg Xr24h-Tab (Metformin Hcl) .... One Tablet By Mouth Daily For Blood Sugar 5)  Tramadol Hcl 50 Mg Tabs (Tramadol Hcl) .... Two Tablets By Mouth Two Times A Day As Needed For Pain 6)  Pravastatin Sodium 20 Mg Tabs (Pravastatin Sodium) .... One Tablet By Mouth Nightly For Cholesterol 7)  Advair Diskus 250-50 Mcg/dose Aepb (Fluticasone-Salmeterol) .Marland Kitchen.. 1 Inhalation Two Times A Day As Needed 8)  Truetrack Test  Strp (Glucose Blood) .... Use To Check Blood Sugar Once Daily 9)  Omeprazole 20 Mg Tbec (Omeprazole) .... One Tablet By Mouth Before Breakfast and Before Dinner 10)  Plavix 75 Mg Tabs (Clopidogrel Bisulfate) .... One Tablet By Mouth Daily  Allergies (verified): No Known Drug Allergies  Past History:  Past medical, surgical, family and social histories (including risk factors) reviewed, and no changes noted (except as noted below).  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 CAD - mother diabetes - mother Father - unsure  Social History: Reviewed history from 12/10/2008 and no changes required. Social - 3 children Tobacco - 1/2 ppd.  started at age 34 ETOH - quit in 02/2005 Drug - no AccountantPacks/Day:  1.5  Review  of Systems       The patient complains of weight change.  The patient denies shortness of breath with activity, shortness of breath at rest, productive cough, non-productive cough, coughing up blood, chest pain, irregular heartbeats,  acid heartburn, indigestion, loss of appetite, abdominal pain, difficulty swallowing, sore throat, tooth/dental problems, headaches, nasal congestion/difficulty breathing through nose, sneezing, itching, ear ache, anxiety, depression, hand/feet swelling, joint stiffness or pain, rash, change in color of mucus, and fever.    Vital Signs:  Patient profile:   45 year old female Menstrual status:  regular Height:      62 inches Weight:      163.50 pounds BMI:     30.01 O2 Sat:      92 % on Room air Temp:     98.7 degrees F oral Pulse rate:   69 / minute BP sitting:   112 / 84  (left arm) Cuff size:   regular  Vitals Entered By: Gweneth Dimitri RN (April 14, 2010 2:35 PM)  O2 Flow:  Room air CC: Pulmonary Consult - sarcoidosis. Comments Medications reviewed with patient Daytime contact number verified with patient. Gweneth Dimitri RN  April 14, 2010 2:35 PM    Physical Exam  Additional Exam:  Gen: Pleasant, well-nourished, in no distress,  normal affect ENT: No lesions,  mouth clear,  oropharynx clear, no postnasal drip Neck: No JVD, no TMG, no carotid bruits Lungs: No use of accessory muscles, no dullness to percussion, distant bs  Cardiovascular: RRR, heart sounds normal, no murmur or gallops, no peripheral edema Abdomen: soft and NT, no HSM,  BS normal Musculoskeletal: No deformities, no cyanosis or clubbing Neuro: alert, non focal Skin: Warm, no lesions or rashes   Angiotensin 1 CE          39 U/L                      8-52  CT of Chest  Procedure date:  03/23/2010  Findings:      bilateral mediastinal and hilar lymphadenopathy, mild reticular pattern in upper lobes.  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  neg bx in the past smoking induced airway disease is likely dx here plan smoking cessation cont  advair obtain full pfts note ACE level normal today Orders: New Patient Level V (78469) Pulmonary Referral (Pulmonary)  Medications Added to Medication List This Visit: 1)  Advair Diskus 250-50 Mcg/dose Aepb (Fluticasone-salmeterol) .Marland Kitchen.. 1 inhalation two times a day  Complete Medication List: 1)  Aspirin 325 Mg Tabs (Aspirin) .... One tablet by mouth daily 2)  Metoprolol Tartrate 50 Mg Tabs (Metoprolol tartrate) .... One tablet by mouth two times a day 3)  Hydrochlorothiazide 25 Mg Tabs (Hydrochlorothiazide) .... One tablet by mouth daily 4)  Metformin Hcl 500 Mg Xr24h-tab (Metformin hcl) .... One tablet by mouth daily for blood sugar 5)  Tramadol Hcl 50 Mg Tabs (Tramadol hcl) .... Two tablets by mouth two times a day as needed for pain 6)  Pravastatin Sodium 20 Mg Tabs (Pravastatin sodium) .... One tablet by mouth nightly for cholesterol 7)  Advair Diskus 250-50 Mcg/dose Aepb (Fluticasone-salmeterol) .Marland Kitchen.. 1 inhalation two times a day 8)  Truetrack Test Strp (Glucose blood) .... Use to check blood sugar once daily 9)  Omeprazole 20 Mg Tbec (Omeprazole) .... One tablet by mouth before breakfast and before dinner 10)  Plavix 75 Mg Tabs (Clopidogrel bisulfate) .... One tablet by mouth daily  Other Orders: T-Angiotensin i-Converting Enzyme ([id number])  Patient Instructions: 1)  Focus on smoking cessation 2)  A pulmonary function study will be obtained 3)  Lab work today 4)  Stay on advair one puff twice daily 5)  I will call with results 6)  Return 6 weeks Prescriptions: ADVAIR DISKUS 250-50 MCG/DOSE AEPB (FLUTICASONE-SALMETEROL) 1 inhalation two times a day  #1 x 6   Entered and Authorized by:   Storm Frisk MD   Signed by:   Storm Frisk MD on 04/14/2010   Method used:   Electronically to        [facility] [address] (retail)       [phone]       [place], [place]  [address]       Ph: [phone]       Fax: [phone]   RxID:    [id number]

## 2010-04-27 NOTE — Progress Notes (Signed)
Summary: Pulmonary Referral  Phone Note Outgoing Call   Summary of Call: Refer pt to Dr. Edwyna Shell (pulmonary) [phone] [facility] She has been there in the past. Recent Chest CT concerning for sarcodosis   Initial call taken by: Lehman Prom FNP,  March 24, 2010 6:11 PM  Follow-up for Phone Call        I SEND THE REFERRAL WITH OV WAITING FOR AN APPT .Marland KitchenCheryll Dessert  March 30, 2010 2:48 PM

## 2010-04-27 NOTE — Assessment & Plan Note (Signed)
Summary: Diabetes   Vital Signs:  Patient profile:   45 year old female Menstrual status:  regular Height:      63. inches Weight:      162.3 pounds Temp:     97.0 degrees F oral Pulse rate:   80 / minute Pulse rhythm:   regular Resp:     20 per minute BP sitting:   130 / 84  (left arm) Cuff size:   regular  Vitals Entered By: Levon Hedger (March 16, 2010 11:31 AM) CC: follow-up visit DM, Lipid Management, Abdominal Pain Is Patient Diabetic? Yes Pain Assessment Patient in pain? no      CBG Result 114 CBG Device ID A  Does patient need assistance? Functional Status Self care Ambulation Normal   CC:  follow-up visit DM, Lipid Management, and Abdominal Pain.  History of Present Illness: Pt into the office for f/u on diabetes.  Diabetes Management History:      The patient is a 45 years old female who comes in for evaluation of DM Type 2.  She is (or has been) enrolled in the "Diabetic Education Program".  She states understanding of dietary principles and is following her diet appropriately.  Sensory loss is noted.  Self foot exams are not being performed.  She is checking home blood sugars.  She says that she is not exercising regularly.        Hypoglycemic symptoms are not occurring.  No hyperglycemic symptoms are reported.    Dyspepsia History:      She has no alarm features of dyspepsia including no history of melena, hematochezia, dysphagia, persistent vomiting, or involuntary weight loss > 5%.  There is a prior history of GERD.  The patient does not have a prior history of documented ulcer disease.  The dominant symptom is not heartburn or acid reflux.  An H-2 blocker medication is not currently being taken.  She has no history of a positive H. Pylori serology.  No previous upper endoscopy has been done.    Lipid Management History:      Positive NCEP/ATP III risk factors include diabetes, HDL cholesterol less than 40, current tobacco user, and ASHD (either  angina/prior MI/prior CABG).  Negative NCEP/ATP III risk factors include female age less than 5 years old, no prior stroke/TIA, no peripheral vascular disease, and no history of aortic aneurysm.        The patient states that she does not know about the "Therapeutic Lifestyle Change" diet.  The patient does not know about adjunctive measures for cholesterol lowering.  She expresses no side effects from her lipid-lowering medication.  The patient denies any symptoms to suggest myopathy or liver disease.      Habits & Providers  Alcohol-Tobacco-Diet     Alcohol drinks/day: 0     Alcohol Counseling: not indicated; use of alcohol is not excessive or problematic     Alcohol type: quit 02/2005     Tobacco Status: current     Tobacco Counseling: to quit use of tobacco products     Cigarette Packs/Day: 0.5     Year Started: age 67  Exercise-Depression-Behavior     Does Patient Exercise: no     Depression Counseling: not indicated; screening negative for depression     Drug Use: never  Allergies (verified): No Known Drug Allergies  Review of Systems CV:  Denies chest pain or discomfort. Resp:  Denies cough. GI:  Denies abdominal pain, nausea, and vomiting.  Physical Exam  General:  alert.   Head:  normocephalic.   Lungs:  normal breath sounds.   Heart:  normal rate and regular rhythm.   Abdomen:  normal bowel sounds.   Neurologic:  alert & oriented X3.    Diabetes Management Exam:    Foot Exam (with socks and/or shoes not present):       Sensory-Monofilament:          Left foot: normal          Right foot: normal   Impression & Recommendations:  Problem # 1:  DIABETES MELLITUS, TYPE II (ICD-250.00)  Her updated medication list for this problem includes:    Aspirin 325 Mg Tabs (Aspirin) ..... One tablet by mouth daily    Metformin Hcl 500 Mg Xr24h-tab (Metformin hcl) ..... One tablet by mouth daily for blood sugar  Orders: Capillary Blood Glucose/CBG (16109)  Problem #  2:  OBESITY (ICD-278.00) down 6 pounds since the last visit  Problem # 3:  HYPERCHOLESTEROLEMIA (ICD-272.0)  Her updated medication list for this problem includes:    Pravastatin Sodium 20 Mg Tabs (Pravastatin sodium) ..... One tablet by mouth nightly for cholesterol  Problem # 4:  TOBACCO ABUSE (ICD-305.1) advise cessation  Problem # 5:  PULMONARY NODULE (ICD-518.89) will f/u with CT pt is a long time smoker Orders: CT with Contrast (CT w/ contrast) T-Basic Metabolic Panel ([id number])  Complete Medication List: 1)  Aspirin 325 Mg Tabs (Aspirin) .... One tablet by mouth daily 2)  Metoprolol Tartrate 50 Mg Tabs (Metoprolol tartrate) .... One tablet by mouth two times a day 3)  Hydrochlorothiazide 25 Mg Tabs (Hydrochlorothiazide) .... One tablet by mouth daily 4)  Metformin Hcl 500 Mg Xr24h-tab (Metformin hcl) .... One tablet by mouth daily for blood sugar 5)  Tramadol Hcl 50 Mg Tabs (Tramadol hcl) .... Two tablets by mouth two times a day as needed for pain 6)  Pravastatin Sodium 20 Mg Tabs (Pravastatin sodium) .... One tablet by mouth nightly for cholesterol 7)  Advair Diskus 250-50 Mcg/dose Aepb (Fluticasone-salmeterol) .Marland Kitchen.. 1 inhalation two times a day 8)  Truetrack Test Strp (Glucose blood) .... Use to check blood sugar once daily 9)  Omeprazole 20 Mg Tbec (Omeprazole) .... One tablet by mouth before breakfast and before dinner 10)  Plavix 75 Mg Tabs (Clopidogrel bisulfate) .... One tablet by mouth daily  Diabetes Management Assessment/Plan:      The following lipid goals have been established for the patient: Total cholesterol goal of 200; LDL cholesterol goal of 70; HDL cholesterol goal of 40; Triglyceride goal of 150.  Her blood pressure goal is < 130/80.    Lipid Assessment/Plan:      Based on NCEP/ATP III, the patient's risk factor category is "history of coronary disease, peripheral vascular disease, cerebrovascular disease, or aortic aneurysm along with either diabetes,  current smoker, or LDL > 130 plus HDL < 40 plus triglycerides > 200".  The patient's lipid goals are as follows: Total cholesterol goal is 200; LDL cholesterol goal is 70; HDL cholesterol goal is 40; Triglyceride goal is 150.    Patient Instructions: 1)  Follow up in 3 months for diabetes. 2)  Come fasting for labs 3)  Keep appointment for CT of the chest.  You will be notified of the results.   Orders Added: 1)  Est. Patient Level IV [91478] 2)  Capillary Blood Glucose/CBG [82948] 3)  CT with Contrast [CT w/ contrast] 4)  T-Basic Metabolic Panel [[id number]]    Last LDL:  122 (08/12/2009 9:40:00 PM)        Diabetic Foot Exam Last Podiatry Exam Date: 12/10/2008    10-g (5.07) Semmes-Weinstein Monofilament Test Performed by: Levon Hedger          Right Foot          Left Foot Visual Inspection               Test Control      normal         normal Site 1         normal         normal Site 2         normal         normal Site 3         normal         normal Site 4         normal         normal Site 5         normal         normal Site 6         normal         normal Site 7         normal         normal Site 8         normal         normal Site 9         normal         normal Site 10         normal         normal  Impression      normal         normal  Laboratory Results   Urine Tests  Date/Time Received: March 16, 2010 1:54 PM   Routine Urinalysis   Color: lt. yellow Appearance: Clear Glucose: negative   (Normal Range: Negative) Bilirubin: negative   (Normal Range: Negative) Ketone: negative   (Normal Range: Negative) Spec. Gravity: >=1.030   (Normal Range: 1.003-1.035) Blood: negative   (Normal Range: Negative) pH: 5.0   (Normal Range: 5.0-8.0) Protein: negative   (Normal Range: Negative) Urobilinogen: 0.2   (Normal Range: 0-1) Nitrite: negative   (Normal Range: Negative) Leukocyte Esterace: negative    (Normal Range: Negative)     Blood Tests     CBG Random:: 114mg /dL

## 2010-04-27 NOTE — Assessment & Plan Note (Signed)
Summary: F/u CT Results   Vital Signs:  Patient profile:   45 year old female Menstrual status:  regular Weight:      164.4 pounds Temp:     97.6 degrees F Pulse rate:   80 / minute Pulse rhythm:   regular Resp:     20 per minute BP sitting:   140 / 90  (left arm) Cuff size:   regular  Vitals Entered By: Levon Hedger (March 24, 2010 9:18 AM) CC: Discuss CT results Is Patient Diabetic? Yes Pain Assessment Patient in pain? no      CBG Result 236 CBG Device ID B  Does patient need assistance? Functional Status Self care Ambulation Normal   CC:  Discuss CT results.  History of Present Illness:  Pt into the office for f/u on CT. Done on yesterday - ? for Sarcoidosis  CT of the Chest Bilateral mediastinal and hilar lymphadenopathy suggests granulomatous disease such as sarcoidosis.  A lymphoproliferative disorder or mediastinal metastasis are considered much less likely.   2.  Mild reticular pattern within the upper lobes as well as several subpleural nodules is suggestive of pulmonary sarcoidosis. 3.  Stable nodular pleural parenchymal thickening at the left lung base is likely related to the same process.  Allergies (verified): No Known Drug Allergies  Review of Systems General:  Complains of weight loss; denies fever. CV:  Denies chest pain or discomfort. Resp:  Denies cough. GI:  Denies abdominal pain, nausea, and vomiting.  Physical Exam  General:  alert.   Head:  normocephalic.   Msk:  up to the exam table Neurologic:  alert & oriented X3.   Skin:  color normal.   Psych:  Oriented X3.     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 One tablet by mouth  daily 2)  Metoprolol Tartrate 50 Mg Tabs (Metoprolol tartrate) .... One tablet by mouth two times a day 3)  Hydrochlorothiazide 25 Mg Tabs (Hydrochlorothiazide) .... One tablet by mouth daily 4)  Metformin Hcl 500 Mg Xr24h-tab (Metformin hcl) .... One tablet by mouth daily for blood sugar 5)  Tramadol Hcl 50 Mg Tabs (Tramadol hcl) .... Two tablets by mouth two times a day as needed for pain 6)  Pravastatin Sodium 20 Mg Tabs (Pravastatin sodium) .... One tablet by mouth nightly for cholesterol 7)  Advair Diskus 250-50 Mcg/dose Aepb (Fluticasone-salmeterol) .Marland Kitchen.. 1 inhalation two times a day 8)  Truetrack Test Strp (Glucose blood) .... Use to check blood sugar once daily 9)  Omeprazole 20 Mg Tbec (Omeprazole) .... One tablet by mouth before breakfast and before dinner 10)  Plavix 75 Mg Tabs (Clopidogrel bisulfate) .... One tablet by mouth daily  Other Orders: Capillary Blood Glucose/CBG (60454)  Patient Instructions: 1)  You will be scheduled for referral to pulmonary for follow up on sarcoidosis. 2)  Call back to this office with the name of the provider you went before. 3)  You will need to go in the next 2-3 months.   Orders Added: 1)  Capillary Blood Glucose/CBG [82948] 2)  Est. Patient Level III [09811]

## 2010-04-27 NOTE — Progress Notes (Signed)
Summary: PULMONARY REFERRAL  Phone Note Outgoing Call   Summary of Call: THEY CALL ME FROM DR Edwyna Shell BECAUSE THEY DON'T TREAT PULMONARY SARCOIDOSIS THAT THE PT WAS REFER FROM THEM TO A [place] [address] .THEY CAN'T SEE HER . Initial call taken by: Cheryll Dessert,  March 31, 2010 12:46 PM  Follow-up for Phone Call        [place]. Call pt and let her know that he can't be seen by that provider based on what they previously stated. refer her to [facility] instead Follow-up by: Lehman Prom FNP,  March 31, 2010 1:10 PM  Additional Follow-up for Phone Call Additional follow up Details #1::        I call [facility] and pt has an appt  04-14-09 @ 2:30pm lvm to pt to call me back  Additional Follow-up by: Cheryll Dessert,  April 03, 2010 8:59 AM

## 2010-05-27 ENCOUNTER — Encounter (INDEPENDENT_AMBULATORY_CARE_PROVIDER_SITE_OTHER): Payer: Self-pay | Admitting: Nurse Practitioner

## 2010-05-28 ENCOUNTER — Inpatient Hospital Stay (INDEPENDENT_AMBULATORY_CARE_PROVIDER_SITE_OTHER)
Admission: RE | Admit: 2010-05-28 | Discharge: 2010-05-28 | Disposition: A | Discharge status: Home / Self Care | Payer: Medicaid Other | Source: Ambulatory Visit | Attending: Family Medicine | Admitting: Family Medicine

## 2010-05-28 DIAGNOSIS — J111 Influenza due to unidentified influenza virus with other respiratory manifestations: Secondary | ICD-10-CM

## 2010-05-28 LAB — POCT RAPID STREP A (OFFICE): Streptococcus, Group A Screen (Direct): NEGATIVE

## 2010-05-29 ENCOUNTER — Telehealth (INDEPENDENT_AMBULATORY_CARE_PROVIDER_SITE_OTHER): Payer: Self-pay | Admitting: Nurse Practitioner

## 2010-06-06 ENCOUNTER — Encounter: Payer: Self-pay | Admitting: Critical Care Medicine

## 2010-06-06 ENCOUNTER — Encounter (INDEPENDENT_AMBULATORY_CARE_PROVIDER_SITE_OTHER): Payer: Medicaid Other

## 2010-06-06 DIAGNOSIS — D869 Sarcoidosis, unspecified: Secondary | ICD-10-CM

## 2010-06-06 DIAGNOSIS — J449 Chronic obstructive pulmonary disease, unspecified: Secondary | ICD-10-CM

## 2010-06-06 LAB — PULMONARY FUNCTION TEST

## 2010-06-06 NOTE — Progress Notes (Signed)
Summary: Call A Nurse F/U  Phone Note Outgoing Call   Summary of Call: Received note from Call an Nures that pt requested cough syrup with codiene. Call to get more details pt should be seeing a pulmonologist now.  has she reviewed current problem with that provider? Initial call taken by: Lehman Prom FNP,  May 29, 2010 4:53 PM  Follow-up for Phone Call        Call could not connect.  Dutch Quint RN  May 30, 2010 5:24 PM   Additional Follow-up for Phone Call Additional follow up Details #1::        Pt. was seen @ Urgent Care was dx with flu given several different meds. Now doing well. Has appt. with Pulmonologist on Monday. Additional Follow-up by: Gaylyn Cheers RN,  June 02, 2010 4:33 PM

## 2010-06-06 NOTE — Letter (Signed)
Summary: CALL A NURSE  CALL A NURSE   Imported By: Arta Bruce 05/30/2010 10:53:07  _____________________________________________________________________  External Attachment:    Type:   Image     Comment:   External Document

## 2010-06-08 ENCOUNTER — Encounter: Payer: Self-pay | Admitting: Critical Care Medicine

## 2010-06-08 ENCOUNTER — Telehealth: Payer: Self-pay | Admitting: Critical Care Medicine

## 2010-06-11 LAB — GLUCOSE, CAPILLARY
Glucose-Capillary: 125 mg/dL — ABNORMAL HIGH (ref 70–99)
Glucose-Capillary: 313 mg/dL — ABNORMAL HIGH (ref 70–99)

## 2010-06-12 LAB — BASIC METABOLIC PANEL
BUN: 8 mg/dL (ref 6–23)
BUN: 8 mg/dL (ref 6–23)
BUN: 9 mg/dL (ref 6–23)
CO2: 28 mEq/L (ref 19–32)
Calcium: 9.3 mg/dL (ref 8.4–10.5)
Creatinine, Ser: 0.81 mg/dL (ref 0.4–1.2)
Creatinine, Ser: 0.88 mg/dL (ref 0.4–1.2)
Creatinine, Ser: 1.02 mg/dL (ref 0.4–1.2)
GFR calc non Af Amer: 59 mL/min — ABNORMAL LOW (ref >60)
GFR calc non Af Amer: >60 mL/min (ref >60)
GFR calc non Af Amer: >60 mL/min (ref >60)
Glucose, Bld: 120 mg/dL — ABNORMAL HIGH (ref 70–99)
Glucose, Bld: 126 mg/dL — ABNORMAL HIGH (ref 70–99)
Glucose, Bld: 166 mg/dL — ABNORMAL HIGH (ref 70–99)
Potassium: 3 mEq/L — ABNORMAL LOW (ref 3.5–5.1)
Potassium: 3.2 mEq/L — ABNORMAL LOW (ref 3.5–5.1)
Sodium: 138 mEq/L (ref 135–145)

## 2010-06-12 LAB — CBC
Hemoglobin: 14.6 g/dL (ref 12.0–15.0)
MCHC: 34.2 g/dL (ref 30.0–36.0)
Platelets: 193 10*3/uL (ref 150–400)
RDW: 12.9 % (ref 11.5–15.5)

## 2010-06-12 LAB — GLUCOSE, CAPILLARY
Glucose-Capillary: 109 mg/dL — ABNORMAL HIGH (ref 70–99)
Glucose-Capillary: 126 mg/dL — ABNORMAL HIGH (ref 70–99)
Glucose-Capillary: 128 mg/dL — ABNORMAL HIGH (ref 70–99)
Glucose-Capillary: 180 mg/dL — ABNORMAL HIGH (ref 70–99)
Glucose-Capillary: 99 mg/dL (ref 70–99)

## 2010-06-13 NOTE — Miscellaneous (Signed)
Summary: Orders Update pft charges   Clinical Lists Changes  Orders: Added new Service order of Carbon Monoxide diffusing w/capacity (94729) - Signed Added new Service order of Lung Volumes/Gas dilution or washout (94727) - Signed Added new Service order of Spirometry (Pre & Post) (94060) - Signed 

## 2010-06-13 NOTE — Miscellaneous (Signed)
Summary: PFTs   Pulmonary Function Test Date: 06/06/2010 Height (in.): 62 Gender: Female  Pre-Spirometry FVC    Value: 2.50 L/min   Pred: 3.22 L/min     % Pred: 78 % FEV1    Value: 1.65 L     Pred: 2.49 L     % Pred: 66 % FEV1/FVC  Value: 66 %     Pred: 77 %    FEF 25-75  Value: 0.88 L/min   Pred: 2.95 L/min     % Pred: 30 %  Post-Spirometry FVC    Value: 2.50 L/min   Pred: 3.22 L/min     % Pred: 78 % FEV1    Value: 1.71 L     Pred: 2.49 L     % Pred: 69 % FEV1/FVC  Value: 68 %     Pred: 77 %    FEF 25-75  Value: 1.04 L/min   Pred: 2.95 L/min     % Pred: 35 %  Lung Volumes TLC    Value: 3.93 L   % Pred: 85 % RV    Value: 1.36 L   % Pred: 88 % DLCO    Value: 9.3 %   % Pred: 42 % DLCO/VA  Value: 2.86 %   % Pred: 69 %  Comments: severe peripheral airflow obstruction,  moderate reduction in DLCO Clinical Lists Changes  Observations: Added new observation of PFT COMMENTS: severe peripheral airflow obstruction,  moderate reduction in DLCO (06/08/2010 15:48) Added new observation of DLCO/VA%EXP: 69 % (06/08/2010 15:48) Added new observation of DLCO/VA: 2.86 % (06/08/2010 15:48) Added new observation of DLCO % EXPEC: 42 % (06/08/2010 15:48) Added new observation of DLCO: 9.3 % (06/08/2010 15:48) Added new observation of RV % EXPECT: 88 % (06/08/2010 15:48) Added new observation of RV: 1.36 L (06/08/2010 15:48) Added new observation of TLC % EXPECT: 85 % (06/08/2010 15:48) Added new observation of TLC: 3.93 L (06/08/2010 15:48) Added new observation of FEF2575%EXPS: 35 % (06/08/2010 15:48) Added new observation of PSTFEF25/75P: 2.95  (06/08/2010 15:48) Added new observation of PSTFEF25/75%: 1.04 L/min (06/08/2010 15:48) Added new observation of FEV1FVCPRDPS: 77 % (06/08/2010 15:48) Added new observation of PSTFEV1/FVC: 68 % (06/08/2010 15:48) Added new observation of POSTFEV1%PRD: 69 % (06/08/2010 15:48) Added new observation of FEV1PRDPST: 2.49 L (06/08/2010 15:48) Added new  observation of POST FEV1: 1.71 L/min (06/08/2010 15:48) Added new observation of POST FVC%EXP: 78 % (06/08/2010 15:48) Added new observation of FVCPRDPST: 3.22 L/min (06/08/2010 15:48) Added new observation of POST FVC: 2.50 L (06/08/2010 15:48) Added new observation of FEF % EXPEC: 30 % (06/08/2010 15:48) Added new observation of FEF25-75%PRE: 2.95 L/min (06/08/2010 15:48) Added new observation of FEF 25-75%: 0.88 L/min (06/08/2010 15:48) Added new observation of FEV1/FVC PRE: 77 % (06/08/2010 15:48) Added new observation of FEV1/FVC: 66 % (06/08/2010 15:48) Added new observation of FEV1 % EXP: 66 % (06/08/2010 15:48) Added new observation of FEV1 PREDICT: 2.49 L (06/08/2010 15:48) Added new observation of FEV1: 1.65 L (06/08/2010 15:48) Added new observation of FVC % EXPECT: 78 % (06/08/2010 15:48) Added new observation of FVC PREDICT: 3.22 L (06/08/2010 15:48) Added new observation of FVC: 2.50 L (06/08/2010 15:48) Added new observation of PFT HEIGHT: 62  (06/08/2010 15:48) Added new observation of PFT DATE: 06/06/2010  (06/08/2010 15:48)

## 2010-06-13 NOTE — Progress Notes (Signed)
Summary: PFTresults,  pt phone numbers incorrect  Phone Note Outgoing Call   Reason for Call: Discuss lab or test results Summary of Call: tried to call pt on phone but all numbers in chart incorrect. pls try to find pt and tell her PFTs showed moderate obstruction and reduction in dlco.  she is to cont inhalers for now Initial call taken by: Storm Frisk MD,  June 08, 2010 2:15 PM  Follow-up for Phone Call        Called, spoke with pt.  She was informed of above PFT results and recs per PW.  She verbalized understanding of this.  Verified pt's home # and added her cell # in reg notes.  Also, scheduled pt for f/u on April 13.  Pt aware. Follow-up by: Gweneth Dimitri RN,  June 09, 2010 10:42 AM

## 2010-06-21 ENCOUNTER — Encounter: Payer: Self-pay | Admitting: Critical Care Medicine

## 2010-07-06 ENCOUNTER — Encounter: Payer: Self-pay | Admitting: Critical Care Medicine

## 2010-07-07 ENCOUNTER — Ambulatory Visit: Payer: Medicaid Other | Admitting: Critical Care Medicine

## 2010-07-26 ENCOUNTER — Encounter: Payer: Self-pay | Admitting: Critical Care Medicine

## 2010-07-28 ENCOUNTER — Ambulatory Visit: Payer: Medicaid Other | Admitting: Critical Care Medicine

## 2010-08-11 NOTE — Op Note (Signed)
Marie [name], Marie [name]               ACCOUNT NO.:  [id number]   MEDICAL RECORD NO.:  [id number]          PATIENT TYPE:  AMB   LOCATION:  SDS                          FACILITY:  [facility]   PHYSICIAN:  Ines Bloomer, M.D. DATE OF BIRTH:  05-Dec-1965   DATE OF PROCEDURE:  10/11/2005  DATE OF DISCHARGE:  10/11/2005                                 OPERATIVE REPORT   PREOPERATIVE DIAGNOSIS:  Left lower lobe nodule, mediastinal adenopathy.   POSTOPERATIVE DIAGNOSIS:  Left lower lobe nodule, mediastinal adenopathy.  Possible sarcoidosis.   PROCEDURE PERFORMED:  Video fiberoptic bronchoscopy and video  mediastinoscopy.   SURGEON:  Ines Bloomer, M.D.   DESCRIPTION OF PROCEDURE:  After general anesthesia, the bronchoscope was  passed through the endotracheal tube, carina was in the midline.  The right  upper lobe and right lower lobe and right lower lobe orifices were normal.  The left upper lobe and lower lobe orifices were normal.  I could not see  any endobronchial lesions, particularly in the left lower lobe.  Washings  were taken from this area.  The video bronchoscopy was removed.   The anterior neck was prepped and draped in the usual sterile manner.  A  transverse incision was made and carried down with electrocautery to the  subcutaneous tissue.  The pre tracheal fascia was entered and biopsies of 4R  and 7 node were done.  Frozen section revealed benign granulomas consistent  with sarcoidosis.  The strap muscles were closed with 2-0 Vicryl  subcutaneous tissue; and with 3-0 Vicryl and Dermabond for the skin.  The  patient was returned to the recovery room in stable condition.           ______________________________  Ines Bloomer, M.D.     DPB/MEDQ  D:  10/11/2005  T:  10/12/2005  Job:  [id number]   cc:   Kendall Flack, M.D.

## 2010-08-31 ENCOUNTER — Ambulatory Visit: Payer: Medicaid Other | Admitting: Physician Assistant

## 2010-09-22 ENCOUNTER — Ambulatory Visit: Payer: Medicaid Other | Admitting: Physician Assistant

## 2010-09-29 ENCOUNTER — Inpatient Hospital Stay (INDEPENDENT_AMBULATORY_CARE_PROVIDER_SITE_OTHER)
Admission: RE | Admit: 2010-09-29 | Discharge: 2010-09-29 | Disposition: A | Discharge status: Home / Self Care | Payer: Self-pay | Source: Ambulatory Visit | Attending: Family Medicine | Admitting: Family Medicine

## 2010-09-29 ENCOUNTER — Emergency Department (HOSPITAL_COMMUNITY)
Admission: EM | Admit: 2010-09-29 | Discharge: 2010-09-29 | Disposition: A | Discharge status: Home / Self Care | Payer: Self-pay | Attending: Emergency Medicine | Admitting: Emergency Medicine

## 2010-09-29 DIAGNOSIS — Z951 Presence of aortocoronary bypass graft: Secondary | ICD-10-CM | POA: Insufficient documentation

## 2010-09-29 DIAGNOSIS — I2510 Atherosclerotic heart disease of native coronary artery without angina pectoris: Secondary | ICD-10-CM | POA: Insufficient documentation

## 2010-09-29 DIAGNOSIS — Z79899 Other long term (current) drug therapy: Secondary | ICD-10-CM | POA: Insufficient documentation

## 2010-09-29 DIAGNOSIS — I10 Essential (primary) hypertension: Secondary | ICD-10-CM | POA: Insufficient documentation

## 2010-09-29 DIAGNOSIS — E119 Type 2 diabetes mellitus without complications: Secondary | ICD-10-CM | POA: Insufficient documentation

## 2010-09-29 DIAGNOSIS — R110 Nausea: Secondary | ICD-10-CM | POA: Insufficient documentation

## 2010-09-29 DIAGNOSIS — K859 Acute pancreatitis without necrosis or infection, unspecified: Secondary | ICD-10-CM

## 2010-09-29 DIAGNOSIS — R1013 Epigastric pain: Secondary | ICD-10-CM | POA: Insufficient documentation

## 2010-09-29 LAB — DIFFERENTIAL
Basophils Relative: 0 % (ref 0–1)
Eosinophils Absolute: 0.1 10*3/uL (ref 0.0–0.7)
Lymphs Abs: 1.6 10*3/uL (ref 0.7–4.0)
Neutro Abs: 5.5 10*3/uL (ref 1.7–7.7)
Neutrophils Relative %: 68 % (ref 43–77)

## 2010-09-29 LAB — COMPREHENSIVE METABOLIC PANEL
ALT: 62 U/L — ABNORMAL HIGH (ref 0–35)
AST: 62 U/L — ABNORMAL HIGH (ref 0–37)
Alkaline Phosphatase: 60 U/L (ref 39–117)
CO2: 27 mEq/L (ref 19–32)
Chloride: 101 mEq/L (ref 96–112)
GFR calc non Af Amer: >60 mL/min (ref >60)
Glucose, Bld: 84 mg/dL (ref 70–99)
Sodium: 139 mEq/L (ref 135–145)
Total Bilirubin: 0.8 mg/dL (ref 0.3–1.2)

## 2010-09-29 LAB — CBC
Hemoglobin: 17.8 g/dL — ABNORMAL HIGH (ref 12.0–15.0)
Platelets: 197 10*3/uL (ref 150–400)
RBC: 5.49 MIL/uL — ABNORMAL HIGH (ref 3.87–5.11)
WBC: 8.1 10*3/uL (ref 4.0–10.5)

## 2010-11-09 ENCOUNTER — Other Ambulatory Visit: Payer: Self-pay | Admitting: Family Medicine

## 2010-11-09 ENCOUNTER — Ambulatory Visit (INDEPENDENT_AMBULATORY_CARE_PROVIDER_SITE_OTHER): Payer: Self-pay | Admitting: Family Medicine

## 2010-11-09 ENCOUNTER — Encounter: Payer: Self-pay | Admitting: Physician Assistant

## 2010-11-09 VITALS — BP 134/95 | HR 78 | Ht 62.0 in | Wt 156.7 lb

## 2010-11-09 DIAGNOSIS — A499 Bacterial infection, unspecified: Secondary | ICD-10-CM

## 2010-11-09 DIAGNOSIS — B9689 Other specified bacterial agents as the cause of diseases classified elsewhere: Secondary | ICD-10-CM

## 2010-11-09 DIAGNOSIS — N760 Acute vaginitis: Secondary | ICD-10-CM

## 2010-11-09 MED ORDER — METRONIDAZOLE 500 MG PO TABS: 500.0000 mg | ORAL_TABLET | Freq: Two times a day (BID) | ORAL | Status: AC

## 2010-11-09 NOTE — Progress Notes (Signed)
  Subjective:    Patient ID: Marie [name], female    DOB: [birth year], 45 y.o.   MRN: [id number]  Vaginal Discharge The patient's primary symptoms include a genital odor and a vaginal discharge. The patient's pertinent negatives include no genital itching, genital lesions, genital rash, missed menses, pelvic pain or vaginal bleeding. This is a chronic problem. Episode onset: about 1-2 months after placement of IUD. The problem occurs constantly. The problem has been unchanged. The patient is experiencing no pain. She is not pregnant. Pertinent negatives include no abdominal pain, anorexia, back pain, chills, constipation, diarrhea, discolored urine, dysuria, fever, flank pain, frequency, headaches, hematuria, joint pain, joint swelling, nausea, painful intercourse, rash, sore throat, urgency or vomiting. The vaginal discharge was copious, malodorous and white. There has been no bleeding. She has not been passing clots. She has not been passing tissue. Exacerbated by: menses. She has tried nothing for the symptoms. She is not sexually active. No, her partner does not have an STD. She uses an IUD for contraception. Her menstrual history has been regular.      Review of Systems  Constitutional: Negative for fever and chills.  HENT: Negative for sore throat.   Gastrointestinal: Negative for nausea, vomiting, abdominal pain, diarrhea, constipation and anorexia.  Genitourinary: Positive for vaginal discharge. Negative for dysuria, urgency, frequency, hematuria, flank pain, pelvic pain and missed menses.  Musculoskeletal: Negative for back pain and joint pain.  Skin: Negative for rash.  Neurological: Negative for headaches.       Objective:   Physical Exam  Constitutional: She appears well-developed and well-nourished.  HENT:  Head: Normocephalic and atraumatic.  Eyes: Pupils are equal, round, and reactive to light.  Neck: Normal range of motion. Neck supple.  Abdominal: Soft. She exhibits no  distension and no mass. There is no tenderness. There is no rebound and no guarding. Hernia confirmed negative in the right inguinal area and confirmed negative in the left inguinal area.  Genitourinary: There is no rash, tenderness, lesion or injury on the right labia. There is no rash, tenderness, lesion or injury on the left labia. No erythema, tenderness or bleeding around the vagina. No foreign body around the vagina. No signs of injury around the vagina. Vaginal discharge found.  Lymphadenopathy:       Right: No inguinal adenopathy present.       Left: No inguinal adenopathy present.          Assessment & Plan:  1.  Bacterial vaginosis Wet prep done - + whiff, + clue cells.  Vaginal swabsGC/Chl sent.  Will treat with Flagyl 500mg  BID x 7 days.  RTC PRN.

## 2010-11-10 LAB — WET PREP, GENITAL
Trich, Wet Prep: NONE SEEN
Yeast Wet Prep HPF POC: NONE SEEN

## 2010-11-11 LAB — GC/CHLAMYDIA PROBE AMP, GENITAL
Chlamydia, DNA Probe: NEGATIVE
GC Probe Amp, Genital: NEGATIVE

## 2011-03-22 ENCOUNTER — Inpatient Hospital Stay (HOSPITAL_COMMUNITY)
Admission: EM | Admit: 2011-03-22 | Discharge: 2011-03-25 | DRG: 440 | Disposition: A | Discharge status: Home / Self Care | Payer: Medicaid Other | Source: Ambulatory Visit | Attending: Internal Medicine | Admitting: Internal Medicine

## 2011-03-22 ENCOUNTER — Encounter (HOSPITAL_COMMUNITY): Payer: Self-pay | Admitting: *Deleted

## 2011-03-22 ENCOUNTER — Emergency Department (HOSPITAL_COMMUNITY): Payer: Medicaid Other

## 2011-03-22 DIAGNOSIS — E669 Obesity, unspecified: Secondary | ICD-10-CM | POA: Diagnosis present

## 2011-03-22 DIAGNOSIS — D860 Sarcoidosis of lung: Secondary | ICD-10-CM | POA: Diagnosis present

## 2011-03-22 DIAGNOSIS — J99 Respiratory disorders in diseases classified elsewhere: Secondary | ICD-10-CM | POA: Diagnosis present

## 2011-03-22 DIAGNOSIS — Z6830 Body mass index (BMI) 30.0-30.9, adult: Secondary | ICD-10-CM

## 2011-03-22 DIAGNOSIS — I2510 Atherosclerotic heart disease of native coronary artery without angina pectoris: Secondary | ICD-10-CM | POA: Diagnosis present

## 2011-03-22 DIAGNOSIS — Z7902 Long term (current) use of antithrombotics/antiplatelets: Secondary | ICD-10-CM

## 2011-03-22 DIAGNOSIS — E118 Type 2 diabetes mellitus with unspecified complications: Secondary | ICD-10-CM | POA: Diagnosis present

## 2011-03-22 DIAGNOSIS — D259 Leiomyoma of uterus, unspecified: Secondary | ICD-10-CM | POA: Diagnosis present

## 2011-03-22 DIAGNOSIS — N390 Urinary tract infection, site not specified: Secondary | ICD-10-CM

## 2011-03-22 DIAGNOSIS — G4733 Obstructive sleep apnea (adult) (pediatric): Secondary | ICD-10-CM | POA: Diagnosis present

## 2011-03-22 DIAGNOSIS — E860 Dehydration: Secondary | ICD-10-CM

## 2011-03-22 DIAGNOSIS — E7800 Pure hypercholesterolemia, unspecified: Secondary | ICD-10-CM | POA: Diagnosis present

## 2011-03-22 DIAGNOSIS — E875 Hyperkalemia: Secondary | ICD-10-CM | POA: Diagnosis present

## 2011-03-22 DIAGNOSIS — K219 Gastro-esophageal reflux disease without esophagitis: Secondary | ICD-10-CM | POA: Insufficient documentation

## 2011-03-22 DIAGNOSIS — K859 Acute pancreatitis without necrosis or infection, unspecified: Principal | ICD-10-CM | POA: Diagnosis present

## 2011-03-22 DIAGNOSIS — F17200 Nicotine dependence, unspecified, uncomplicated: Secondary | ICD-10-CM | POA: Diagnosis present

## 2011-03-22 DIAGNOSIS — E876 Hypokalemia: Secondary | ICD-10-CM | POA: Diagnosis not present

## 2011-03-22 DIAGNOSIS — G4730 Sleep apnea, unspecified: Secondary | ICD-10-CM | POA: Diagnosis present

## 2011-03-22 DIAGNOSIS — Z720 Tobacco use: Secondary | ICD-10-CM | POA: Diagnosis present

## 2011-03-22 DIAGNOSIS — D869 Sarcoidosis, unspecified: Secondary | ICD-10-CM | POA: Diagnosis present

## 2011-03-22 DIAGNOSIS — E119 Type 2 diabetes mellitus without complications: Secondary | ICD-10-CM

## 2011-03-22 DIAGNOSIS — Z951 Presence of aortocoronary bypass graft: Secondary | ICD-10-CM

## 2011-03-22 DIAGNOSIS — K861 Other chronic pancreatitis: Secondary | ICD-10-CM | POA: Diagnosis present

## 2011-03-22 DIAGNOSIS — E785 Hyperlipidemia, unspecified: Secondary | ICD-10-CM | POA: Diagnosis present

## 2011-03-22 LAB — URINALYSIS, ROUTINE W REFLEX MICROSCOPIC
Bilirubin Urine: NEGATIVE
Glucose, UA: NEGATIVE mg/dL
Ketones, ur: 15 mg/dL — AB
Leukocytes, UA: NEGATIVE
Nitrite: POSITIVE — AB
Protein, ur: NEGATIVE mg/dL
Specific Gravity, Urine: 1.019 (ref 1.005–1.030)
Urobilinogen, UA: 0.2 mg/dL (ref 0.0–1.0)
pH: 5 (ref 5.0–8.0)

## 2011-03-22 LAB — CBC
Hemoglobin: 19.4 g/dL — ABNORMAL HIGH (ref 12.0–15.0)
Hemoglobin: 17 g/dL — ABNORMAL HIGH (ref 12.0–15.0)
MCH: 34 pg (ref 26.0–34.0)
MCV: 95.1 fL (ref 78.0–100.0)
Platelets: 195 10*3/uL (ref 150–400)
Platelets: 174 10*3/uL (ref 150–400)
RBC: 5.7 MIL/uL — ABNORMAL HIGH (ref 3.87–5.11)
RBC: 5.09 MIL/uL (ref 3.87–5.11)

## 2011-03-22 LAB — LIPID PANEL
Cholesterol: 210 mg/dL — ABNORMAL HIGH (ref 0–200)
HDL: 52 mg/dL
LDL Cholesterol: 135 mg/dL — ABNORMAL HIGH (ref 0–99)
Total CHOL/HDL Ratio: 4 ratio
Triglycerides: 116 mg/dL
VLDL: 23 mg/dL (ref 0–40)

## 2011-03-22 LAB — GLUCOSE, CAPILLARY
Glucose-Capillary: 171 mg/dL — ABNORMAL HIGH (ref 70–99)
Glucose-Capillary: 99 mg/dL (ref 70–99)

## 2011-03-22 LAB — COMPREHENSIVE METABOLIC PANEL
ALT: 70 U/L — ABNORMAL HIGH (ref 0–35)
Alkaline Phosphatase: 67 U/L (ref 39–117)
CO2: 24 mEq/L (ref 19–32)
GFR calc Af Amer: >90 mL/min (ref >90)
GFR calc non Af Amer: 82 mL/min — ABNORMAL LOW (ref >90)
Glucose, Bld: 111 mg/dL — ABNORMAL HIGH (ref 70–99)
Potassium: 5.2 mEq/L — ABNORMAL HIGH (ref 3.5–5.1)
Sodium: 136 mEq/L (ref 135–145)
Total Protein: 8.7 g/dL — ABNORMAL HIGH (ref 6.0–8.3)

## 2011-03-22 LAB — CREATININE, SERUM
Creatinine, Ser: 0.78 mg/dL (ref 0.50–1.10)
GFR calc non Af Amer: >90 mL/min (ref >90)

## 2011-03-22 LAB — PROTIME-INR
INR: 1.07 (ref 0.00–1.49)
Prothrombin Time: 14.1 seconds (ref 11.6–15.2)

## 2011-03-22 LAB — URINE MICROSCOPIC-ADD ON

## 2011-03-22 LAB — LIPASE, BLOOD: Lipase: 616 U/L — ABNORMAL HIGH (ref 11–59)

## 2011-03-22 MED ORDER — LEVONORGESTREL 20 MCG/24HR IU IUD: 1.0000 | INTRAUTERINE_SYSTEM | Freq: Once | INTRAUTERINE | Status: DC

## 2011-03-22 MED ORDER — SODIUM CHLORIDE 0.9 % IV SOLN: INTRAVENOUS | Status: AC

## 2011-03-22 MED ORDER — ONDANSETRON HCL 4 MG/2ML IJ SOLN
4.0000 mg | Freq: Once | INTRAMUSCULAR | Status: AC
  Administered 2011-03-22: 4 mg via INTRAVENOUS
  Filled 2011-03-22: qty 2

## 2011-03-22 MED ORDER — FLUTICASONE-SALMETEROL 250-50 MCG/DOSE IN AEPB
1.0000 | INHALATION_SPRAY | Freq: Two times a day (BID) | RESPIRATORY_TRACT | Status: DC
  Administered 2011-03-22 – 2011-03-25 (×5): 1 via RESPIRATORY_TRACT
  Filled 2011-03-22: qty 14

## 2011-03-22 MED ORDER — ONDANSETRON HCL 4 MG PO TABS: 4.0000 mg | ORAL_TABLET | Freq: Four times a day (QID) | ORAL | Status: DC | PRN

## 2011-03-22 MED ORDER — CLOPIDOGREL BISULFATE 75 MG PO TABS
75.0000 mg | ORAL_TABLET | Freq: Every day | ORAL | Status: DC
  Administered 2011-03-22 – 2011-03-25 (×4): 75 mg via ORAL
  Filled 2011-03-22 (×4): qty 1

## 2011-03-22 MED ORDER — SODIUM CHLORIDE 0.9 % IV BOLUS (SEPSIS)
1000.0000 mL | Freq: Once | INTRAVENOUS | Status: AC
  Administered 2011-03-22: 1000 mL via INTRAVENOUS

## 2011-03-22 MED ORDER — SODIUM CHLORIDE 0.9 % IV SOLN
INTRAVENOUS | Status: DC
  Administered 2011-03-22: 15:00:00 via INTRAVENOUS

## 2011-03-22 MED ORDER — HYDROMORPHONE HCL PF 1 MG/ML IJ SOLN
1.0000 mg | Freq: Once | INTRAMUSCULAR | Status: AC
  Administered 2011-03-22: 1 mg via INTRAVENOUS
  Filled 2011-03-22: qty 1

## 2011-03-22 MED ORDER — METOPROLOL SUCCINATE ER 50 MG PO TB24
50.0000 mg | ORAL_TABLET | Freq: Every day | ORAL | Status: DC
  Administered 2011-03-22 – 2011-03-25 (×4): 50 mg via ORAL
  Filled 2011-03-22 (×4): qty 1

## 2011-03-22 MED ORDER — HYDROMORPHONE HCL PF 1 MG/ML IJ SOLN
1.0000 mg | INTRAMUSCULAR | Status: DC | PRN
  Administered 2011-03-22 – 2011-03-23 (×6): 1 mg via INTRAVENOUS
  Filled 2011-03-22 (×6): qty 1

## 2011-03-22 MED ORDER — HYDROCODONE-ACETAMINOPHEN 5-325 MG PO TABS
1.0000 | ORAL_TABLET | ORAL | Status: DC | PRN
  Administered 2011-03-22 – 2011-03-25 (×8): 2 via ORAL
  Filled 2011-03-22 (×8): qty 2
  Filled 2011-03-22: qty 1
  Filled 2011-03-22: qty 2

## 2011-03-22 MED ORDER — MORPHINE SULFATE 4 MG/ML IJ SOLN
4.0000 mg | Freq: Once | INTRAMUSCULAR | Status: AC
  Administered 2011-03-22: 4 mg via INTRAVENOUS
  Filled 2011-03-22: qty 1

## 2011-03-22 MED ORDER — ALBUTEROL SULFATE (5 MG/ML) 0.5% IN NEBU: 2.5000 mg | INHALATION_SOLUTION | RESPIRATORY_TRACT | Status: DC | PRN

## 2011-03-22 MED ORDER — ONDANSETRON HCL 4 MG/2ML IJ SOLN
4.0000 mg | Freq: Four times a day (QID) | INTRAMUSCULAR | Status: DC | PRN
  Administered 2011-03-22: 4 mg via INTRAVENOUS
  Filled 2011-03-22: qty 2

## 2011-03-22 MED ORDER — PANTOPRAZOLE SODIUM 40 MG PO TBEC
40.0000 mg | DELAYED_RELEASE_TABLET | Freq: Every day | ORAL | Status: DC
  Administered 2011-03-22 – 2011-03-25 (×4): 40 mg via ORAL
  Filled 2011-03-22 (×5): qty 1

## 2011-03-22 MED ORDER — SODIUM POLYSTYRENE SULFONATE 15 GM/60ML PO SUSP
30.0000 g | Freq: Once | ORAL | Status: AC
  Administered 2011-03-22: 30 g via ORAL
  Filled 2011-03-22: qty 120

## 2011-03-22 MED ORDER — DEXTROSE 5 % AND 0.45 % NACL IV BOLUS
125.0000 mL | INTRAVENOUS | Status: DC
  Administered 2011-03-22 – 2011-03-23 (×4): 125 mL via INTRAVENOUS

## 2011-03-22 MED ORDER — HEPARIN SODIUM (PORCINE) 5000 UNIT/ML IJ SOLN
5000.0000 [IU] | Freq: Three times a day (TID) | INTRAMUSCULAR | Status: DC
  Administered 2011-03-22 – 2011-03-25 (×8): 5000 [IU] via SUBCUTANEOUS
  Filled 2011-03-22 (×11): qty 1

## 2011-03-22 MED ORDER — ASPIRIN 325 MG PO TABS
325.0000 mg | ORAL_TABLET | Freq: Every day | ORAL | Status: DC
  Administered 2011-03-22 – 2011-03-25 (×4): 325 mg via ORAL
  Filled 2011-03-22 (×4): qty 1

## 2011-03-22 MED ORDER — SODIUM CHLORIDE 0.9 % IV BOLUS (SEPSIS): 2000.0000 mL | Freq: Once | INTRAVENOUS | Status: DC

## 2011-03-22 MED ORDER — GUAIFENESIN-DM 100-10 MG/5ML PO SYRP: 5.0000 mL | ORAL_SOLUTION | ORAL | Status: DC | PRN

## 2011-03-22 MED ORDER — DEXTROSE 5 % IV SOLN
1.0000 g | Freq: Once | INTRAVENOUS | Status: AC
  Administered 2011-03-22: 1 g via INTRAVENOUS
  Filled 2011-03-22: qty 10

## 2011-03-22 NOTE — ED Provider Notes (Signed)
Procedure Note: Procedure: Ultrasound-guided IV placement Left antecubital fossa was prepped with alcohol pads. Still certainly was placed on warm. Ultrasound probe was used to locate the vein in left antecubital fossa. Vein confirmed with compressibility and no pulse with ultrasound. 1.8 inch Angiocath 20-gauge was placed into the vein on first attempt. Return of dark nonpulsatile blood was noted. IV was threaded over the needle into the vein. At the attachment was set up an IV flushed easily after dark nonpulsatile blood was drawn back. IV was secured with Tegaderm and tape. Ultrasound image was not archived.  Elwin Mocha, MD 03/22/11 [phone]

## 2011-03-22 NOTE — ED Provider Notes (Signed)
History     CSN: [id number]  Arrival date & time 03/22/11  7829   First MD Initiated Contact with Patient 03/22/11 1000      Chief Complaint  Patient presents with  . Abdominal Pain    (Consider location/radiation/quality/duration/timing/severity/associated sxs/prior treatment) The history is provided by the patient.   the patient is a 45 year old female with a history of chronic pancreatitis who presents with abdominal pain that began at 11 AM yesterday. It was initially located in the epigastric region and has gradually progressed to radiate to the entire upper abdomen. It is associated with vomiting and diarrhea, both non-bloody. It is sharp and stabbing, rated an 8/10 and is constant. It was initially relieved somewhat with ibuprofen, but this did not help this morning. Nothing else makes the pain better or worse. There has been no other treatment.    Past Medical History  Diagnosis Date  . Loss of weight   . Sarcoidosis   . Leiomyoma of uterus, unspecified   . Pure hypercholesterolemia   . Obesity, unspecified   . Chronic pancreatitis   . Coronary atherosclerosis of unspecified type of vessel, native or graft   . Unspecified sleep apnea   . Type II or unspecified type diabetes mellitus without mention of complication, not stated as uncontrolled     Past Surgical History  Procedure Date  . Coronary artery bypass graft   . Coronary stent placement     Family History  Problem Relation Age of Onset  . Diabetes Mother   . Coronary artery disease Mother     History  Substance Use Topics  . Smoking status: Current Everyday Smoker    Types: Cigarettes  . Smokeless tobacco: Not on file  . Alcohol Use: Yes    OB History    Grav Para Term Preterm Abortions TAB SAB Ect Mult Living   3 3 3       3       Review of Systems  Constitutional: Negative for fever and chills.  HENT: Negative for ear pain, congestion, sore throat, neck pain and neck stiffness.   Eyes:  Negative for pain and visual disturbance.  Respiratory: Negative for cough, chest tightness and shortness of breath.   Cardiovascular: Negative for chest pain, palpitations and leg swelling.  Gastrointestinal: Positive for nausea, vomiting, abdominal pain and diarrhea. Negative for blood in stool.  Genitourinary: Negative for dysuria, hematuria and flank pain.  Musculoskeletal: Negative for back pain and gait problem.  Skin: Negative for rash and wound.  Neurological: Negative for dizziness, syncope, weakness and headaches.  Hematological: Does not bruise/bleed easily.  Psychiatric/Behavioral: Negative for confusion.    Allergies  Review of patient's allergies indicates no known allergies.  Home Medications   Current Outpatient Rx  Name Route Sig Dispense Refill  . ASPIRIN 325 MG PO TABS Oral Take 325 mg by mouth daily.      Marland Kitchen CLOPIDOGREL BISULFATE 75 MG PO TABS Oral Take 75 mg by mouth daily.      Marland Kitchen FLUTICASONE-SALMETEROL 250-50 MCG/DOSE IN AEPB Inhalation Inhale 1 puff into the lungs every 12 (twelve) hours.      Marland Kitchen HYDROCHLOROTHIAZIDE 25 MG PO TABS Oral Take 25 mg by mouth daily.      . IBUPROFEN 200 MG PO TABS Oral Take 400 mg by mouth every 6 (six) hours as needed. For pain     . METFORMIN HCL 500 MG PO TABS Oral Take 500 mg by mouth daily.      Marland Kitchen  METOPROLOL SUCCINATE ER 50 MG PO TB24 Oral Take 50 mg by mouth daily.     Marland Kitchen OMEPRAZOLE 20 MG PO CPDR  One tablet before breakfast and one before dinner     . PRAVASTATIN SODIUM 20 MG PO TABS Oral Take 20 mg by mouth daily.      . TRAMADOL HCL 50 MG PO TABS Oral Take 50 mg by mouth 2 (two) times daily as needed. For pain    . GLUCOSE BLOOD VI STRP Other 1 each by Other route as needed. Use as instructed     . LEVONORGESTREL 20 MCG/24HR IU IUD Intrauterine 1 each by Intrauterine route once.        BP 171/88  Pulse 80  Temp(Src) 98 F (36.7 C) (Oral)  Resp 18  SpO2 98%  Physical Exam  Nursing note and vitals  reviewed. Constitutional: She is oriented to person, place, and time. She appears well-developed and well-nourished. She appears distressed.  HENT:  Head: Normocephalic and atraumatic.  Right Ear: External ear normal.  Left Ear: External ear normal.  Nose: Nose normal.  Mouth/Throat: Oropharynx is clear and moist.  Eyes: EOM are normal. Pupils are equal, round, and reactive to light. No scleral icterus.  Neck: Normal range of motion. Neck supple.  Cardiovascular: Normal rate, regular rhythm, normal heart sounds and intact distal pulses.   No murmur heard. Pulmonary/Chest: Effort normal and breath sounds normal. No respiratory distress. She has no wheezes. She exhibits no tenderness.  Abdominal: Soft. Bowel sounds are normal. She exhibits no distension. There is tenderness in the right upper quadrant, epigastric area and left upper quadrant. There is guarding. There is no rigidity and no rebound.  Musculoskeletal: Normal range of motion. She exhibits no edema and no tenderness.  Lymphadenopathy:    She has no cervical adenopathy.  Neurological: She is alert and oriented to person, place, and time. No cranial nerve deficit. Coordination normal.  Skin: Skin is warm and dry. No rash noted.  Psychiatric: She has a normal mood and affect. Her behavior is normal.    ED Course  Procedures (including critical care time)  Labs Reviewed - No data to display No results found.   1. Acute pancreatitis   2. UTI (urinary tract infection)   3. Dehydration       MDM  Labs reviewed. Acute pancreatitis with pain that has been difficult to control in the emergency department. Patient is to be admitted for pancreatitis, urinary tract infection, dehydration. Plan discussed with patient, who voices understanding. I have spoken with the admitting physician for the [facility] and a bed request has been placed. IV fluids have been ordered, as well as IV antibiotics.        Elwyn Reach  [place], [place] 03/22/11 [phone]

## 2011-03-22 NOTE — ED Notes (Signed)
Report called to 4700, pt will go up when she returns from [place]

## 2011-03-22 NOTE — ED Notes (Signed)
[id number] READY

## 2011-03-22 NOTE — ED Notes (Signed)
Pt reports hx of pancreatitis states has been without pain for years until recently. Pt reports nausea and vomiting.

## 2011-03-22 NOTE — ED Notes (Signed)
Patient transported to Ultrasound 

## 2011-03-22 NOTE — H&P (Signed)
Ronnald Collum [phone]  Outpatient Primary MD for the patient is MARTIN,NYKEDTRA, NP, NP  With History of -  Past Medical History  Diagnosis Date  . Loss of weight   . Sarcoidosis   . Leiomyoma of uterus, unspecified   . Pure hypercholesterolemia   . Obesity, unspecified   . Chronic pancreatitis   . Coronary atherosclerosis of unspecified type of vessel, native or graft   . Unspecified sleep apnea   . Type II or unspecified type diabetes mellitus without mention of complication, not stated as uncontrolled       Past Surgical History  Procedure Date  . Coronary artery bypass graft   . Coronary stent placement     in for   Chief Complaint  Patient presents with  . Abdominal Pain     HPI  Marie [name]  is a 45 y.o. female,  With H/O chronic pancreatitis due to alcohol use in the past attack being 6 years ago,  She has now quit alcohol and only drinks a drink of wine every night, now with 24 hour history of epigastric pain which is sharp radiating to her back associated with nausea vomiting with no aggravating or relieving factors,ER she has labs suggestive of acute pancreatitis, hyperkalemia and I was asked to admit the patient.    Review of Systems    In addition to the HPI above,  No Fever-chills, No Headache, No changes with Vision or hearing, No problems swallowing food or Liquids, No Chest pain, Cough or Shortness of Breath, Bowel movements are regular, No Blood in stool or Urine, No dysuria, No new skin rashes or bruises, No new joints pains-aches,  No new weakness, tingling, numbness in any extremity, No recent weight gain or loss, No polyuria, polydypsia or polyphagia, No significant Mental Stressors.  A full 10 point Review of Systems was done, except as stated above, all other Review of Systems were negative.   Social History History  Substance Use Topics  . Smoking status: Current Everyday Smoker    Types: Cigarettes  .  Smokeless tobacco: Not on file  . Alcohol Use: Yes      Family History Family History  Problem Relation Age of Onset  . Diabetes Mother   . Coronary artery disease Mother       Prior to Admission medications   Medication Sig Start Date End Date Taking? Authorizing Provider  aspirin 325 MG tablet Take 325 mg by mouth daily.     Yes Historical Provider, MD  clopidogrel (PLAVIX) 75 MG tablet Take 75 mg by mouth daily.     Yes Historical Provider, MD  Fluticasone-Salmeterol (ADVAIR DISKUS) 250-50 MCG/DOSE AEPB Inhale 1 puff into the lungs every 12 (twelve) hours.     Yes Historical Provider, MD  hydrochlorothiazide 25 MG tablet Take 25 mg by mouth daily.     Yes Historical Provider, MD  ibuprofen (ADVIL,MOTRIN) 200 MG tablet Take 400 mg by mouth every 6 (six) hours as needed. For pain    Yes Historical Provider, MD  metFORMIN (GLUCOPHAGE) 500 MG tablet Take 500 mg by mouth daily.     Yes Historical Provider, MD  metoprolol (TOPROL-XL) 50 MG 24 hr tablet Take 50 mg by mouth daily.    Yes Historical Provider, MD  omeprazole (PRILOSEC) 20 MG capsule One tablet before breakfast and one before dinner    Yes Historical Provider, MD  pravastatin (PRAVACHOL) 20 MG tablet Take 20 mg by mouth daily.     Yes Historical  Provider, MD  traMADol (ULTRAM) 50 MG tablet Take 50 mg by mouth 2 (two) times daily as needed. For pain   Yes Historical Provider, MD  glucose blood test strip 1 each by Other route as needed. Use as instructed     Historical Provider, MD  levonorgestrel (MIRENA) 20 MCG/24HR IUD 1 each by Intrauterine route once.      Historical Provider, MD    No Known Allergies  Physical Exam No intake or output data in the 24 hours ending 03/22/11 1502 Blood pressure 144/87, pulse 78, temperature 97.7 F (36.5 C), temperature source Oral, resp. rate 18, SpO2 97.00%.   1. General middle age obese African American female lying in bed in NAD,     2. Normal affect and insight, Not Suicidal or  Homicidal, Awake Alert, Oriented *3.  3. No F.N deficits, ALL C.Nerves Intact, Strength 5/5 all 4 extremities, Sensation intact all 4 extremities, Plantars down going.  4. Ears and Eyes appear Normal, Conjunctivae clear, PERRLA. Moist Oral Mucosa.  5. Supple Neck, No JVD, No cervical lymphadenopathy appriciated, No Carotid Bruits.  6. Symmetrical Chest wall movement, Good air movement bilaterally, CTAB.  7. RRR, No Gallops, Rubs or Murmurs, No Parasternal Heave.  8. Positive Bowel Sounds, Abdomen Soft, Non tender, No organomegaly appriciated,       No rebound -guarding or rigidity.  9.  No Cyanosis, Normal Skin Turgor, No Skin Rash or Bruise.  10. Good muscle tone,  joints appear normal , no effusions, Normal ROM.  11. No Palpable Lymph Nodes in Neck or Axillae     Data Review  CBC  Lab 03/22/11 1046  WBC 9.6  HGB 19.4*  HCT 54.2*  PLT 195  MCV 95.1  MCH 34.0  MCHC 35.8  RDW 12.6  LYMPHSABS --  MONOABS --  EOSABS --  BASOSABS --  BANDABS --   ------------------------------------------------------------------------------------------------------------------ Chemistries   Lab 03/22/11 1011  NA 136  K 5.2*  CL 99  CO2 24  GLUCOSE 111*  BUN 11  CREATININE 0.85  CALCIUM 10.4  MG --  AST 56*  ALT 70*  ALKPHOS 67  BILITOT 1.4*   ------------------------------------------------------------------------------------------------------------------ CrCl is unknown because both a height and weight (above a minimum accepted value) are required for this calculation. ------------------------------------------------------------------------------------------------------------------ No results found for this basename: TSH,T4TOTAL,FREET3,T3FREE,THYROIDAB in the last 72 hours  Coagulation profile No results found for this basename: INR:5,PROTIME:5 in the last 168  hours ------------------------------------------------------------------------------------------------------------------- No results found for this basename: DDIMER:2 in the last 72 hours ------------------------------------------------------------------------------------------------------------------- Cardiac Enzymes No results found for this basename: CK:3,CKMB:3,TROPONINI:3,MYOGLOBIN:3 in the last 168 hours ------------------------------------------------------------------------------------------------------------------ No components found with this basename: POCBNP:3 ------------------------------------------------------------------------------------------------------------------   Assessment & Plan  1. Pancreatitis in a patient with history of chronic pancreatitis last attack 6 years ago who now does not abuse alcohol. But the patient keep her n.p.o. Except ice chips, IV fluids, check cholesterol panel and right upper quadrant ultrasound, repeat CMP and lipase in the morning. Will hold Statin, HCTZ.  2. Hyperkalemia -IV fluids for hydration, Kayexalate now, telemetry monitoring, will repeat in the morning.  3. 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  4. 80 status post stent placement in the past no acute issues home medications will be continued except for the medications dictated #1 which are being held.  5. This type II gentle D5 half-normal drip as patient is n.p.o., will hold metformin, will place on every 6 sliding scale.  6. Tobacco abuse -  and counseled.  7. Dyslipidemia. We'll check a cholesterol panel for now statin will be held due to acute pancreatitis.    DVT Prophylaxis Heparin   AM Labs Ordered, also please review Full Orders  Admission, patients condition and plan of care including tests being ordered have been discussed with the patient and her boyfriend  who indicate understanding and agree with the plan and  Code Status.  Code Status Full  Condition GUARDED   Leroy Sea M.D on 03/22/2011 at 3:02 PM  [facility] Office  [phone]

## 2011-03-22 NOTE — Progress Notes (Signed)
Utilization Review Completed.Marie [name] T12/27/2012   

## 2011-03-23 LAB — GLUCOSE, CAPILLARY
Glucose-Capillary: 102 mg/dL — ABNORMAL HIGH (ref 70–99)
Glucose-Capillary: 112 mg/dL — ABNORMAL HIGH (ref 70–99)
Glucose-Capillary: 114 mg/dL — ABNORMAL HIGH (ref 70–99)

## 2011-03-23 LAB — COMPREHENSIVE METABOLIC PANEL
Albumin: 3 g/dL — ABNORMAL LOW (ref 3.5–5.2)
BUN: 7 mg/dL (ref 6–23)
Chloride: 101 mEq/L (ref 96–112)
Creatinine, Ser: 0.75 mg/dL (ref 0.50–1.10)
GFR calc Af Amer: >90 mL/min (ref >90)
Glucose, Bld: 83 mg/dL (ref 70–99)
Total Bilirubin: 1 mg/dL (ref 0.3–1.2)

## 2011-03-23 LAB — CBC
MCV: 95.5 fL (ref 78.0–100.0)
Platelets: 163 10*3/uL (ref 150–400)
RBC: 4.88 MIL/uL (ref 3.87–5.11)
RDW: 12.6 % (ref 11.5–15.5)
WBC: 5.6 10*3/uL (ref 4.0–10.5)

## 2011-03-23 MED ORDER — FOLIC ACID 1 MG PO TABS
1.0000 mg | ORAL_TABLET | Freq: Every day | ORAL | Status: DC
  Administered 2011-03-23 – 2011-03-25 (×3): 1 mg via ORAL
  Filled 2011-03-23 (×3): qty 1

## 2011-03-23 MED ORDER — LORAZEPAM 0.5 MG PO TABS: 1.0000 mg | ORAL_TABLET | Freq: Four times a day (QID) | ORAL | Status: DC | PRN

## 2011-03-23 MED ORDER — NICOTINE 14 MG/24HR TD PT24
14.0000 mg | MEDICATED_PATCH | Freq: Every day | TRANSDERMAL | Status: DC
  Administered 2011-03-23 – 2011-03-25 (×3): 14 mg via TRANSDERMAL
  Filled 2011-03-23 (×3): qty 1

## 2011-03-23 MED ORDER — LORAZEPAM 2 MG/ML IJ SOLN: 0.0000 mg | Freq: Four times a day (QID) | INTRAMUSCULAR | Status: DC

## 2011-03-23 MED ORDER — POTASSIUM CHLORIDE 2 MEQ/ML IV SOLN
Freq: Once | INTRAVENOUS | Status: AC
  Administered 2011-03-23: 18:00:00 via INTRAVENOUS
  Filled 2011-03-23: qty 1000

## 2011-03-23 MED ORDER — INSULIN ASPART 100 UNIT/ML ~~LOC~~ SOLN: 0.0000 [IU] | Freq: Every day | SUBCUTANEOUS | Status: DC

## 2011-03-23 MED ORDER — DIPHENHYDRAMINE HCL 50 MG/ML IJ SOLN: 12.5000 mg | Freq: Three times a day (TID) | INTRAMUSCULAR | Status: DC | PRN

## 2011-03-23 MED ORDER — DIPHENHYDRAMINE HCL 12.5 MG/5ML PO ELIX
12.5000 mg | ORAL_SOLUTION | Freq: Four times a day (QID) | ORAL | Status: DC | PRN
  Administered 2011-03-23 – 2011-03-25 (×5): 12.5 mg via ORAL
  Filled 2011-03-23 (×4): qty 5

## 2011-03-23 MED ORDER — THIAMINE HCL 100 MG/ML IJ SOLN
100.0000 mg | Freq: Every day | INTRAMUSCULAR | Status: DC
  Filled 2011-03-23 (×3): qty 1

## 2011-03-23 MED ORDER — ADULT MULTIVITAMIN W/MINERALS CH
1.0000 | ORAL_TABLET | Freq: Every day | ORAL | Status: DC
  Administered 2011-03-23 – 2011-03-25 (×3): 1 via ORAL
  Filled 2011-03-23 (×3): qty 1

## 2011-03-23 MED ORDER — VITAMIN B-1 100 MG PO TABS
100.0000 mg | ORAL_TABLET | Freq: Every day | ORAL | Status: DC
  Administered 2011-03-23 – 2011-03-25 (×3): 100 mg via ORAL
  Filled 2011-03-23 (×3): qty 1

## 2011-03-23 MED ORDER — DIPHENHYDRAMINE HCL 12.5 MG/5ML PO ELIX
25.0000 mg | ORAL_SOLUTION | Freq: Once | ORAL | Status: AC
  Administered 2011-03-23: 25 mg via ORAL
  Filled 2011-03-23: qty 10

## 2011-03-23 MED ORDER — DIPHENHYDRAMINE HCL 12.5 MG/5ML PO ELIX
12.5000 mg | ORAL_SOLUTION | Freq: Once | ORAL | Status: DC
  Filled 2011-03-23: qty 5

## 2011-03-23 MED ORDER — INSULIN ASPART 100 UNIT/ML ~~LOC~~ SOLN
0.0000 [IU] | Freq: Three times a day (TID) | SUBCUTANEOUS | Status: DC
  Filled 2011-03-23: qty 3

## 2011-03-23 MED ORDER — LORAZEPAM 2 MG/ML IJ SOLN: 1.0000 mg | Freq: Four times a day (QID) | INTRAMUSCULAR | Status: DC | PRN

## 2011-03-23 MED ORDER — LORAZEPAM 2 MG/ML IJ SOLN: 0.0000 mg | Freq: Two times a day (BID) | INTRAMUSCULAR | Status: DC

## 2011-03-23 NOTE — Progress Notes (Addendum)
Subjective: [name] states that she feels better today than she did yesterday. She admits to drinking approximately 3 glasses of wine on habitual nightly basis. She she denies having delirium tremens in the past. She does acknowledge however that she knows that the consumption of wine will worsen her pancreatitis. The patient is requesting food at this time stating that she's hungry. She rates her pain at a 4/10 presently. She states that the hives is minimal there is been about a 6/10 which is decreased from yesterday at 10/10. The patient states that she usually does not have any difficulty with oral intake and has not required any more than Ultram as an outpatient on a chronic basis. The patient's only other complaint is for pruritis which she states has been occurring for several months. Objective: Filed Vitals:   03/23/11 0524 03/23/11 0841 03/23/11 1019 03/23/11 1420  BP: 135/90  127/81 146/81  Pulse: 61  92 79  Temp: 98.3 F (36.8 C)  98.1 F (36.7 C) 98.1 F (36.7 C)  TempSrc: Oral  Oral Oral  Resp: 18  18 18   Height:      Weight: 72.2 kg (159 lb 2.8 oz)     SpO2: 94% 98% 98% 90%   Weight change:   Intake/Output Summary (Last 24 hours) at 03/23/11 2030 Last data filed at 03/23/11 1840  Gross per 24 hour  Intake 4249.59 ml  Output   2800 ml  Net 1449.59 ml    General: Alert, awake, oriented x3, in no acute distress. Patient has a very ruddy appearance overall.  HEENT: [place]/AT [place], EOMI Neck: Trachea midline,  no masses, no thyromegal,y no JVD, no carotid bruit OROPHARYNX:  Moist, No exudate/ erythema/lesions.  Heart: Regular rate and rhythm, without murmurs, rubs, gallops, PMI non-displaced, no heaves or thrills on palpation.  Lungs: Clear to auscultation, no wheezing or rhonchi noted. No increased vocal fremitus resonant to percussion  Abdomen: Soft, mild diffuse tenderness, nondistended, positive bowel sounds, no masses no hepatosplenomegaly noted.  Neuro: No focal  neurological deficits noted cranial nerves II through XII grossly intact. DTRs 2+ bilaterally upper and lower extremities. Strength 5/5 in bilateral upper and lower extremities. Musculoskeletal: No warm swelling or erythema around joints, no spinal tenderness noted. Psychiatric: Patient alert and oriented x3, good insight and cognition, good recent to remote recall. Lymph node survey: No cervical axillary or inguinal lymphadenopathy noted.     Lab Results:  [facility] 03/23/11 0503 03/22/11 2056 03/22/11 1011  NA 135 -- 136  K 3.4* -- 5.2*  CL 101 -- 99  CO2 24 -- 24  GLUCOSE 83 -- 111*  BUN 7 -- 11  CREATININE 0.75 0.78 --  CALCIUM 8.4 -- 10.4  MG -- -- --  PHOS -- -- --    Basename 03/23/11 0503 03/22/11 1011  AST 28 56*  ALT 39* 70*  ALKPHOS 47 67  BILITOT 1.0 1.4*  PROT 6.3 8.7*  ALBUMIN 3.0* 4.2    Basename 03/22/11 1011  LIPASE 616*  AMYLASE --    Basename 03/23/11 0503 03/22/11 2056  WBC 5.6 6.6  NEUTROABS -- --  HGB 16.2* 17.0*  HCT 46.6* 48.5*  MCV 95.5 95.3  PLT 163 174   No results found for this basename: CKTOTAL:3,CKMB:3,CKMBINDEX:3,TROPONINI:3 in the last 72 hours No components found with this basename: POCBNP:3 No results found for this basename: DDIMER:2 in the last 72 hours No results found for this basename: HGBA1C:2 in the last 72 hours  Basename 03/22/11 1449  CHOL 210*  HDL 52  LDLCALC 135*  TRIG 116  CHOLHDL 4.0  LDLDIRECT --    Basename 03/22/11 2056  TSH 3.155  T4TOTAL --  T3FREE --  THYROIDAB --   No results found for this basename: VITAMINB12:2,FOLATE:2,FERRITIN:2,TIBC:2,IRON:2,RETICCTPCT:2 in the last 72 hours  Micro Results: No results found for this or any previous visit (from the past 240 hour(s)).  Studies/Results: US Abdomen Complete  03/22/2011  *RADIOLOGY REPORT*  Clinical Data:  45 year old female with pancreatitis.  Query abnormal CBD.  COMPLETE ABDOMINAL ULTRASOUND  Comparison:  09/12/2009.  CT abdomen and  pelvis 08/16/2009.  Findings:  Gallbladder:  No gallstones, gallbladder wall thickening, or pericholecystic fluid. No sonographic Murphy's sign.  Common bile duct:  Within normal limits at 6 mm diameter.  Liver:  No intrahepatic ductal dilatation.  Echogenicity within normal limits.  No focal liver lesion.  IVC:  Appears normal.  Pancreas:  Preserved pancreatic echogenicity.  Main pancreatic duct is at the upper limits of normal or mildly enlarged at 3-4 mm.  No peripancreatic fluid.  Spleen:  Normal measuring 9.8 cm in length.  Right Kidney:  Normal measuring 10.4 cm in length.  Left Kidney:  Normal measuring 10.7 cm in length.  Abdominal aorta:  Incompletely visualized due to overlying bowel gas, visualized portions within normal limits.  IMPRESSION: 1.  No biliary ductal dilatation. 2.No pancreatic edema or peripancreatic inflammation evident by ultrasound.  Pancreatic duct which is at the upper limits of normal to mildly dilated, significance unclear.  Original Report Authenticated By: Harley Hallmark, M.D.    Medications: I have reviewed the patient's current medications. Scheduled Meds:   . sodium chloride   Intravenous STAT  . aspirin  325 mg Oral Daily  . clopidogrel  75 mg Oral Daily  . dextrose 5 % and 0.45% NaCl 1,000 mL with multivitamins adult (MVI -12) 10 mL, potassium chloride 20 mEq infusion   Intravenous Once  . diphenhydrAMINE  12.5 mg Oral Once  . diphenhydrAMINE  25 mg Oral Once  . Fluticasone-Salmeterol  1 puff Inhalation Q12H  . folic acid  1 mg Oral Daily  . heparin  5,000 Units Subcutaneous Q8H  . LORazepam  0-4 mg Intravenous Q6H   Followed by  . LORazepam  0-4 mg Intravenous Q12H  . metoprolol  50 mg Oral Daily  . mulitivitamin with minerals  1 tablet Oral Daily  . pantoprazole  40 mg Oral Q1200  . thiamine  100 mg Oral Daily   Or  . thiamine  100 mg Intravenous Daily   Continuous Infusions:   . DISCONTD: dextrose 5 % and 0.45% NaCl 125 mL (03/23/11 1433)   PRN  Meds:.albuterol, diphenhydrAMINE, diphenhydrAMINE, guaiFENesin-dextromethorphan, HYDROcodone-acetaminophen, HYDROmorphone, LORazepam, LORazepam, ondansetron (ZOFRAN) IV, ondansetron Assessment/Plan: Patient Active Hospital Problem List: Acute on chronic pancreatitis (03/22/2011)   Assessment: Patient has a history of chronic pancreatitis and is now presenting with symptoms suggestive of an acute process. The patient however has no findings on CT or by laboratory studies of an acute pancreatitis however this may just be a reflection of a burnt out pancreas. The patient reports pain control is asking me. I will start the patient on clear liquids and advance her diet as tolerated to a carb modified low fat diet.     DIABETES MELLITUS, TYPE II (03/27/2003)   Assessment: The patient normally takes Glucophage at home however this was held with the patient is n.p.o. and the patient to decide if insulin until she is tolerating  her diet and adjustments date antiglycemic medications at that time.    HYPERCHOLESTEROLEMIA (02/22/2009)   Assessment: Pravastatin on hold while the patient is n.p.o.    OBESITY (12/10/2008)   Assessment: Noted    TOBACCO ABUSE (12/10/2008)   Assessment: We'll given NicoDerm patch while hospitalized    CAD (09/25/1998)   Assessment: Patient has a history of coronary artery bypass graft and coronary stent placement. She is on Plavix but it is unclear as to whether or not she has a drug-eluting stent.    Plan: Continue Plavix.   ACID REFLUX DISEASE (08/03/2009)   Assessment: Continue PPIs    PANCREATITIS, CHRONIC (12/10/2008)   Assessment: See above    SLEEP APNEA (12/02/2008)   Assessment: Patient does not use a CPAP    SARCOIDOSIS, PULMONARY (03/24/2010)   Assessment: Noted stable    ETOH HABITUAL USE: The patient has ongoing habitual daily alcohol use. She is at risk for alcohol withdrawal. The patient is also at high risk for complications of pancreatitis in the setting of  chronic alcohol use. Once the patient carefully and treat with  Ativan if she shows any signs of withdrawal or DTs.  MILD HYPOKALEMIA: Replete by IV therapy .  DVT prophylaxis with subcutaneous heparin    LOS: 1 day

## 2011-03-23 NOTE — Progress Notes (Signed)
Full assessment in shadow chart. Patient has resources for Medicaid. CSW is signing off. [place], [place] [phone]

## 2011-03-24 DIAGNOSIS — E876 Hypokalemia: Secondary | ICD-10-CM | POA: Diagnosis not present

## 2011-03-24 LAB — GLUCOSE, CAPILLARY
Glucose-Capillary: 93 mg/dL (ref 70–99)
Glucose-Capillary: 88 mg/dL (ref 70–99)
Glucose-Capillary: 105 mg/dL — ABNORMAL HIGH (ref 70–99)

## 2011-03-24 LAB — BASIC METABOLIC PANEL
CO2: 26 mEq/L (ref 19–32)
Chloride: 104 mEq/L (ref 96–112)
Creatinine, Ser: 0.8 mg/dL (ref 0.50–1.10)
Glucose, Bld: 97 mg/dL (ref 70–99)
Sodium: 137 mEq/L (ref 135–145)

## 2011-03-24 LAB — MAGNESIUM: Magnesium: 1.7 mg/dL (ref 1.5–2.5)

## 2011-03-24 MED ORDER — POTASSIUM CHLORIDE CRYS ER 20 MEQ PO TBCR
40.0000 meq | EXTENDED_RELEASE_TABLET | ORAL | Status: AC
  Administered 2011-03-24 (×2): 40 meq via ORAL
  Filled 2011-03-24 (×2): qty 2

## 2011-03-24 NOTE — Progress Notes (Signed)
Subjective: [name] has tolerated her diet well and is now up to low-fat carb modified diet. The patient states that at its worst the pain is about a 4/10 which is equal to about a headache for her. The patient denies any symptoms of withdrawal and states that she feels much better.  Objective: Filed Vitals:   03/24/11 0430 03/24/11 0604 03/24/11 1000 03/24/11 1003  BP: 143/97 127/80 130/88   Pulse: 81 80 54   Temp:  97.5 F (36.4 C)    TempSrc:  Oral    Resp:  18    Height:      Weight:  73.3 kg (161 lb 9.6 oz)    SpO2:  93%  94%   Weight change: 1.904 kg (4 lb 3.2 oz)  Intake/Output Summary (Last 24 hours) at 03/24/11 1437 Last data filed at 03/24/11 1100  Gross per 24 hour  Intake 2062.92 ml  Output    800 ml  Net 1262.92 ml    General: Alert, awake, oriented x3, in no acute distress. Patient has a very ruddy appearance overall.  HEENT: [place]/AT [place], EOMI Neck: Trachea midline,  no masses, no thyromegal,y no JVD, no carotid bruit OROPHARYNX:  Moist, No exudate/ erythema/lesions.  Heart: Regular rate and rhythm, without murmurs, rubs, gallops, PMI non-displaced, no heaves or thrills on palpation.  Lungs: Clear to auscultation, no wheezing or rhonchi noted. No increased vocal fremitus resonant to percussion  Abdomen: Soft, mild epigastric tenderness, nondistended, positive bowel sounds, no masses no hepatosplenomegaly noted.  Neuro: No focal neurological deficits noted cranial nerves II through XII grossly intact. DTRs 2+ bilaterally upper and lower extremities. Strength 5/5 in bilateral upper and lower extremities. Musculoskeletal: No warm swelling or erythema around joints, no spinal tenderness noted. Psychiatric: Patient alert and oriented x3, good insight and cognition, good recent to remote recall. Lymph node survey: No cervical axillary or inguinal lymphadenopathy noted.     Lab Results:  [facility] 03/24/11 1040 03/24/11 0545 03/23/11 0503  NA -- 137 135  K --  3.3* 3.4*  CL -- 104 101  CO2 -- 26 24  GLUCOSE -- 97 83  BUN -- 6 7  CREATININE -- 0.80 0.75  CALCIUM -- 8.8 8.4  MG 1.7 -- --  PHOS -- -- --    Basename 03/23/11 0503 03/22/11 1011  AST 28 56*  ALT 39* 70*  ALKPHOS 47 67  BILITOT 1.0 1.4*  PROT 6.3 8.7*  ALBUMIN 3.0* 4.2    Basename 03/22/11 1011  LIPASE 616*  AMYLASE --    Basename 03/23/11 0503 03/22/11 2056  WBC 5.6 6.6  NEUTROABS -- --  HGB 16.2* 17.0*  HCT 46.6* 48.5*  MCV 95.5 95.3  PLT 163 174   No results found for this basename: CKTOTAL:3,CKMB:3,CKMBINDEX:3,TROPONINI:3 in the last 72 hours No components found with this basename: POCBNP:3 No results found for this basename: DDIMER:2 in the last 72 hours No results found for this basename: HGBA1C:2 in the last 72 hours  Basename 03/22/11 1449  CHOL 210*  HDL 52  LDLCALC 135*  TRIG 116  CHOLHDL 4.0  LDLDIRECT --    Basename 03/22/11 2056  TSH 3.155  T4TOTAL --  T3FREE --  THYROIDAB --   No results found for this basename: VITAMINB12:2,FOLATE:2,FERRITIN:2,TIBC:2,IRON:2,RETICCTPCT:2 in the last 72 hours  Micro Results: No results found for this or any previous visit (from the past 240 hour(s)).  Studies/Results: US Abdomen Complete  03/22/2011  *RADIOLOGY REPORT*  Clinical Data:  45 year old female  with pancreatitis.  Query abnormal CBD.  COMPLETE ABDOMINAL ULTRASOUND  Comparison:  09/12/2009.  CT abdomen and pelvis 08/16/2009.  Findings:  Gallbladder:  No gallstones, gallbladder wall thickening, or pericholecystic fluid. No sonographic Murphy's sign.  Common bile duct:  Within normal limits at 6 mm diameter.  Liver:  No intrahepatic ductal dilatation.  Echogenicity within normal limits.  No focal liver lesion.  IVC:  Appears normal.  Pancreas:  Preserved pancreatic echogenicity.  Main pancreatic duct is at the upper limits of normal or mildly enlarged at 3-4 mm.  No peripancreatic fluid.  Spleen:  Normal measuring 9.8 cm in length.  Right Kidney:   Normal measuring 10.4 cm in length.  Left Kidney:  Normal measuring 10.7 cm in length.  Abdominal aorta:  Incompletely visualized due to overlying bowel gas, visualized portions within normal limits.  IMPRESSION: 1.  No biliary ductal dilatation. 2.No pancreatic edema or peripancreatic inflammation evident by ultrasound.  Pancreatic duct which is at the upper limits of normal to mildly dilated, significance unclear.  Original Report Authenticated By: Harley Hallmark, M.D.    Medications: I have reviewed the patient's current medications. Scheduled Meds:    . sodium chloride   Intravenous STAT  . aspirin  325 mg Oral Daily  . clopidogrel  75 mg Oral Daily  . dextrose 5 % and 0.45% NaCl 1,000 mL with multivitamins adult (MVI -12) 10 mL, potassium chloride 20 mEq infusion   Intravenous Once  . diphenhydrAMINE  12.5 mg Oral Once  . Fluticasone-Salmeterol  1 puff Inhalation Q12H  . folic acid  1 mg Oral Daily  . heparin  5,000 Units Subcutaneous Q8H  . insulin aspart  0-5 Units Subcutaneous QHS  . insulin aspart  0-9 Units Subcutaneous TID WC  . LORazepam  0-4 mg Intravenous Q6H   Followed by  . LORazepam  0-4 mg Intravenous Q12H  . metoprolol  50 mg Oral Daily  . mulitivitamin with minerals  1 tablet Oral Daily  . nicotine  14 mg Transdermal Daily  . pantoprazole  40 mg Oral Q1200  . potassium chloride  40 mEq Oral Q2H  . thiamine  100 mg Oral Daily   Or  . thiamine  100 mg Intravenous Daily   Continuous Infusions:    . DISCONTD: dextrose 5 % and 0.45% NaCl 125 mL (03/23/11 1433)   PRN Meds:.albuterol, diphenhydrAMINE, diphenhydrAMINE, guaiFENesin-dextromethorphan, HYDROcodone-acetaminophen, HYDROmorphone, LORazepam, LORazepam, ondansetron (ZOFRAN) IV, ondansetron Assessment/Plan: Patient Active Hospital Problem List: Acute on chronic pancreatitis (03/22/2011)   Assessment: Patient has a history of chronic pancreatitis and the acute component seems to have resolved. Pt is tolerating  diet well.    DIABETES MELLITUS, TYPE II (03/27/2003)   Assessment: The patient normally takes Glucophage at home however this was held with the patient is n.p.o. and the patient will continue on  Insulin through today and adjust medications after today   HYPERCHOLESTEROLEMIA (02/22/2009)   Assessment: Pravastatin on hold while the patient is n.p.o.    OBESITY (12/10/2008)   Assessment: Noted    TOBACCO ABUSE (12/10/2008)   Assessment: Pt has refused Nicoderm patch   CAD (09/25/1998)   Assessment: Patient has a history of coronary artery bypass graft and coronary stent placement. She is on Plavix but it is unclear as to whether or not she has a drug-eluting stent.    Plan: Continue Plavix.   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 She is at risk for alcohol withdrawal. The patient is also at high risk for complications of pancreatitis in the setting of chronic alcohol use. Once the patient carefully and treat with  Ativan if she shows any signs of withdrawal or DTs.  MILD HYPOKALEMIA: Replete by IV therapy .  DVT prophylaxis with subcutaneous heparin  Anticipate D/C home tomorrow.    LOS: 2 days

## 2011-03-24 NOTE — ED Provider Notes (Signed)
I saw and evaluated the patient, reviewed the resident's note and I agree with the findings and plan.   Suzi Roots, MD 03/24/11 [phone]

## 2011-03-24 NOTE — ED Provider Notes (Signed)
Medical screening examination/treatment/procedure(s) were conducted as a shared visit with non-physician practitioner(s) and myself.  I personally evaluated the patient during the encounter Pt w hx pancreatitis c/o epigastric pain. Tenderness to epigastric area without rebound or guarding. Lipase elevated. Nv. Will admit.   Suzi Roots, MD 03/24/11 [phone]

## 2011-03-25 LAB — BASIC METABOLIC PANEL
BUN: 9 mg/dL (ref 6–23)
CO2: 26 mEq/L (ref 19–32)
Calcium: 9.6 mg/dL (ref 8.4–10.5)
Creatinine, Ser: 0.99 mg/dL (ref 0.50–1.10)
Glucose, Bld: 75 mg/dL (ref 70–99)

## 2011-03-25 LAB — GLUCOSE, CAPILLARY

## 2011-03-25 MED ORDER — TRAMADOL HCL 50 MG PO TABS: 50.0000 mg | ORAL_TABLET | Freq: Two times a day (BID) | ORAL | Status: DC | PRN

## 2011-03-25 MED ORDER — FOLIC ACID 1 MG PO TABS: 1.0000 mg | ORAL_TABLET | Freq: Every day | ORAL | Status: DC

## 2011-03-25 MED ORDER — POTASSIUM CHLORIDE ER 10 MEQ PO TBCR: 20.0000 meq | EXTENDED_RELEASE_TABLET | Freq: Every day | ORAL | Status: DC

## 2011-03-25 MED ORDER — THIAMINE HCL 100 MG PO TABS: 100.0000 mg | ORAL_TABLET | Freq: Every day | ORAL | Status: DC

## 2011-03-25 NOTE — Discharge Summary (Signed)
Marie [name] MRN: [id number] DOB/AGE: [birth year] 45 y.o.  Admit date: 03/22/2011 Discharge date: 03/25/2011  Primary Care Physician:  Marie Prom, NP, NP   Discharge Diagnoses:   Patient Active Problem List  Diagnoses  . FIBROIDS, UTERUS  . DIABETES MELLITUS, TYPE II  . HYPERCHOLESTEROLEMIA  . OBESITY  . TOBACCO ABUSE  . CAD  . HEART DISEASE  . ACID REFLUX DISEASE  . PANCREATITIS, CHRONIC  . SLEEP APNEA  . SARCOIDOSIS, PULMONARY  . BRONCHITIS, OBSTRUCTIVE CHRONIC  . WEIGHT LOSS  . Acute pancreatitis  . Hypokalemia    DISCHARGE MEDICATION: Current Discharge Medication List    START taking these medications   Details  folic acid (FOLVITE) 1 MG tablet Take 1 tablet (1 mg total) by mouth daily. Qty: 30 tablet, Refills: 0    potassium chloride (K-DUR) 10 MEQ tablet Take 2 tablets (20 mEq total) by mouth daily. Qty: 60 tablet, Refills: 0    thiamine 100 MG tablet Take 1 tablet (100 mg total) by mouth daily. Qty: 30 tablet, Refills: 0      CONTINUE these medications which have NOT CHANGED   Details  aspirin 325 MG tablet Take 325 mg by mouth daily.      clopidogrel (PLAVIX) 75 MG tablet Take 75 mg by mouth daily.      Fluticasone-Salmeterol (ADVAIR DISKUS) 250-50 MCG/DOSE AEPB Inhale 1 puff into the lungs every 12 (twelve) hours.      hydrochlorothiazide 25 MG tablet Take 25 mg by mouth daily.      metFORMIN (GLUCOPHAGE) 500 MG tablet Take 500 mg by mouth daily.      metoprolol (TOPROL-XL) 50 MG 24 hr tablet Take 50 mg by mouth daily.     omeprazole (PRILOSEC) 20 MG capsule One tablet before breakfast and one before dinner     pravastatin (PRAVACHOL) 20 MG tablet Take 20 mg by mouth daily.      traMADol (ULTRAM) 50 MG tablet Take 50 mg by mouth 2 (two) times daily as needed. For pain    glucose blood test strip 1 each by Other route as needed. Use as instructed     levonorgestrel (MIRENA) 20 MCG/24HR IUD 1 each by Intrauterine route once.        STOP  taking these medications     ibuprofen (ADVIL,MOTRIN) 200 MG tablet           SIGNIFICANT DIAGNOSTIC STUDIES:  US Abdomen Complete  03/22/2011  *RADIOLOGY REPORT*  Clinical Data:  45 year old female with pancreatitis.  Query abnormal CBD.  COMPLETE ABDOMINAL ULTRASOUND  Comparison:  09/12/2009.  CT abdomen and pelvis 08/16/2009.  Findings:  Gallbladder:  No gallstones, gallbladder wall thickening, or pericholecystic fluid. No sonographic Murphy's sign.  Common bile duct:  Within normal limits at 6 mm diameter.  Liver:  No intrahepatic ductal dilatation.  Echogenicity within normal limits.  No focal liver lesion.  IVC:  Appears normal.  Pancreas:  Preserved pancreatic echogenicity.  Main pancreatic duct is at the upper limits of normal or mildly enlarged at 3-4 mm.  No peripancreatic fluid.  Spleen:  Normal measuring 9.8 cm in length.  Right Kidney:  Normal measuring 10.4 cm in length.  Left Kidney:  Normal measuring 10.7 cm in length.  Abdominal aorta:  Incompletely visualized due to overlying bowel gas, visualized portions within normal limits.  IMPRESSION: 1.  No biliary ductal dilatation. 2.No pancreatic edema or peripancreatic inflammation evident by ultrasound.  Pancreatic duct which is at the upper limits of normal to mildly  dilated, significance unclear.  Original Report Authenticated By: Harley Hallmark, M.D.       No results found for this or any previous visit (from the past 240 hour(s)).  BRIEF ADMITTING H & P: Marie [name] is a 45 y.o. female, With H/O chronic pancreatitis due to alcohol use in the past attack being 6 years ago, She drinks 3 glasses of wine every night, now with 24 hour history of epigastric pain which is sharp radiating to her back associated with nausea vomiting with no aggravating or relieving factors,ER she has labs suggestive of acute pancreatitis, hyperkalemia and I was asked to admit the patient.       Hospital Course:  Present on Admission:  .DIABETES  MELLITUS, TYPE II: The patient normally takes Glucophage at home however this was held while the patient is n.p.o. Glucophage will be resumed at D/C. Marland KitchenHYPERCHOLESTEROLEMIA: Continue Pravastatin. .TOBACCO ABUSE: Pt counseled against further tobacco use. .CAD: continue plavix .SARCOIDOSIS, PULMONARY: stable .Acute on Chronic pancreatitis: Patient was given bowel rest initially. Her diet advance as tolerated. Presently patient is tolerating a carb modified low fat diet without any problems.  HYPOKALEMIA: This is supplemented with IV and orally. The patient is being discharged home on oral potassium.    Disposition and Follow-up:  Patient to followup with her primary physician Dr. Daphine Deutscher within one week   DISCHARGE EXAM:  General: Alert, awake, oriented x3, in no acute distress. Patient has a very ruddy appearance overall.  Blood pressure 150/84, pulse 72, temperature 98 F (36.7 C), temperature source Oral, resp. rate 18, height 5' (1.524 m), weight 72.122 kg (159 lb), last menstrual period 03/21/2011, SpO2 94.00%. HEENT: [place]/AT [place], EOMI  Neck: Trachea midline, no masses, no thyromegal,y no JVD, no carotid bruit  OROPHARYNX: Moist, No exudate/ erythema/lesions.  Heart: Regular rate and rhythm, without murmurs, rubs, gallops, PMI non-displaced, no heaves or thrills on palpation.  Lungs: Clear to auscultation, no wheezing or rhonchi noted. No increased vocal fremitus resonant to percussion  Abdomen: Soft, mild epigastric tenderness, nondistended, positive bowel sounds, no masses no hepatosplenomegaly noted.  Neuro: No focal neurological deficits noted cranial nerves II through XII grossly intact. DTRs 2+ bilaterally upper and lower extremities. Strength 5/5 in bilateral upper and lower extremities.  Musculoskeletal: No warm swelling or erythema around joints, no spinal tenderness noted.  Psychiatric: Patient alert and oriented x3, good insight and cognition, good recent to remote recall.    Lymph node survey: No cervical axillary or inguinal lymphadenopathy noted.        Basename 03/24/11 1040 03/24/11 0545 03/23/11 0503  NA -- 137 135  K -- 3.3* 3.4*  CL -- 104 101  CO2 -- 26 24  GLUCOSE -- 97 83  BUN -- 6 7  CREATININE -- 0.80 0.75  CALCIUM -- 8.8 8.4  MG 1.7 -- --  PHOS -- -- --    Basename 03/23/11 0503  AST 28  ALT 39*  ALKPHOS 47  BILITOT 1.0  PROT 6.3  ALBUMIN 3.0*   No results found for this basename: LIPASE:2,AMYLASE:2 in the last 72 hours  Basename 03/23/11 0503 03/22/11 2056  WBC 5.6 6.6  NEUTROABS -- --  HGB 16.2* 17.0*  HCT 46.6* 48.5*  MCV 95.5 95.3  PLT 163 174   Total time for D/C process including face to face time approximately 40 minutes. Signed: MATTHEWS,MICHELLE A. 03/25/2011, 11:41 AM

## 2012-01-08 ENCOUNTER — Encounter (HOSPITAL_COMMUNITY): Payer: Self-pay | Admitting: *Deleted

## 2012-01-08 ENCOUNTER — Inpatient Hospital Stay (HOSPITAL_COMMUNITY)
Admission: EM | Admit: 2012-01-08 | Discharge: 2012-01-11 | DRG: 440 | Disposition: A | Discharge status: Home / Self Care | Payer: Medicaid Other | Attending: Internal Medicine | Admitting: Internal Medicine

## 2012-01-08 ENCOUNTER — Emergency Department (INDEPENDENT_AMBULATORY_CARE_PROVIDER_SITE_OTHER)
Admission: EM | Admit: 2012-01-08 | Discharge: 2012-01-08 | Disposition: A | Discharge status: Nursing Home (Includes ICF) | Payer: Self-pay | Source: Home / Self Care | Attending: Emergency Medicine | Admitting: Emergency Medicine

## 2012-01-08 ENCOUNTER — Encounter (HOSPITAL_COMMUNITY): Payer: Self-pay | Admitting: Emergency Medicine

## 2012-01-08 DIAGNOSIS — I2510 Atherosclerotic heart disease of native coronary artery without angina pectoris: Secondary | ICD-10-CM

## 2012-01-08 DIAGNOSIS — D869 Sarcoidosis, unspecified: Secondary | ICD-10-CM | POA: Diagnosis present

## 2012-01-08 DIAGNOSIS — Z9861 Coronary angioplasty status: Secondary | ICD-10-CM

## 2012-01-08 DIAGNOSIS — I519 Heart disease, unspecified: Secondary | ICD-10-CM

## 2012-01-08 DIAGNOSIS — Z7982 Long term (current) use of aspirin: Secondary | ICD-10-CM

## 2012-01-08 DIAGNOSIS — I10 Essential (primary) hypertension: Secondary | ICD-10-CM | POA: Diagnosis present

## 2012-01-08 DIAGNOSIS — G4730 Sleep apnea, unspecified: Secondary | ICD-10-CM | POA: Diagnosis present

## 2012-01-08 DIAGNOSIS — J449 Chronic obstructive pulmonary disease, unspecified: Secondary | ICD-10-CM

## 2012-01-08 DIAGNOSIS — Z720 Tobacco use: Secondary | ICD-10-CM | POA: Diagnosis present

## 2012-01-08 DIAGNOSIS — R634 Abnormal weight loss: Secondary | ICD-10-CM

## 2012-01-08 DIAGNOSIS — K861 Other chronic pancreatitis: Secondary | ICD-10-CM | POA: Diagnosis present

## 2012-01-08 DIAGNOSIS — Z6829 Body mass index (BMI) 29.0-29.9, adult: Secondary | ICD-10-CM

## 2012-01-08 DIAGNOSIS — E119 Type 2 diabetes mellitus without complications: Secondary | ICD-10-CM | POA: Diagnosis present

## 2012-01-08 DIAGNOSIS — E669 Obesity, unspecified: Secondary | ICD-10-CM | POA: Diagnosis present

## 2012-01-08 DIAGNOSIS — Z951 Presence of aortocoronary bypass graft: Secondary | ICD-10-CM

## 2012-01-08 DIAGNOSIS — K219 Gastro-esophageal reflux disease without esophagitis: Secondary | ICD-10-CM | POA: Diagnosis present

## 2012-01-08 DIAGNOSIS — D259 Leiomyoma of uterus, unspecified: Secondary | ICD-10-CM

## 2012-01-08 DIAGNOSIS — E118 Type 2 diabetes mellitus with unspecified complications: Secondary | ICD-10-CM | POA: Diagnosis present

## 2012-01-08 DIAGNOSIS — Z7902 Long term (current) use of antithrombotics/antiplatelets: Secondary | ICD-10-CM

## 2012-01-08 DIAGNOSIS — J4489 Other specified chronic obstructive pulmonary disease: Secondary | ICD-10-CM | POA: Diagnosis present

## 2012-01-08 DIAGNOSIS — E876 Hypokalemia: Secondary | ICD-10-CM

## 2012-01-08 DIAGNOSIS — F1010 Alcohol abuse, uncomplicated: Secondary | ICD-10-CM | POA: Diagnosis present

## 2012-01-08 DIAGNOSIS — K859 Acute pancreatitis without necrosis or infection, unspecified: Principal | ICD-10-CM | POA: Diagnosis present

## 2012-01-08 DIAGNOSIS — J99 Respiratory disorders in diseases classified elsewhere: Secondary | ICD-10-CM | POA: Diagnosis present

## 2012-01-08 DIAGNOSIS — D72829 Elevated white blood cell count, unspecified: Secondary | ICD-10-CM | POA: Diagnosis present

## 2012-01-08 DIAGNOSIS — E7800 Pure hypercholesterolemia, unspecified: Secondary | ICD-10-CM | POA: Diagnosis present

## 2012-01-08 DIAGNOSIS — F17200 Nicotine dependence, unspecified, uncomplicated: Secondary | ICD-10-CM

## 2012-01-08 HISTORY — DX: Shortness of breath: R06.02

## 2012-01-08 LAB — CBC WITH DIFFERENTIAL/PLATELET
Eosinophils Absolute: 0.1 10*3/uL (ref 0.0–0.7)
Eosinophils Relative: 1 % (ref 0–5)
HCT: 51.1 % — ABNORMAL HIGH (ref 36.0–46.0)
Hemoglobin: 18.2 g/dL — ABNORMAL HIGH (ref 12.0–15.0)
Lymphocytes Relative: 15 % (ref 12–46)
Lymphs Abs: 1.9 10*3/uL (ref 0.7–4.0)
MCH: 32.9 pg (ref 26.0–34.0)
MCV: 92.2 fL (ref 78.0–100.0)
Monocytes Relative: 7 % (ref 3–12)
RBC: 5.54 MIL/uL — ABNORMAL HIGH (ref 3.87–5.11)
WBC: 12.5 10*3/uL — ABNORMAL HIGH (ref 4.0–10.5)

## 2012-01-08 LAB — COMPREHENSIVE METABOLIC PANEL
ALT: 78 U/L — ABNORMAL HIGH (ref 0–35)
Alkaline Phosphatase: 65 U/L (ref 39–117)
BUN: 11 mg/dL (ref 6–23)
CO2: 29 mEq/L (ref 19–32)
Calcium: 11 mg/dL — ABNORMAL HIGH (ref 8.4–10.5)
GFR calc Af Amer: >90 mL/min (ref >90)
GFR calc non Af Amer: 80 mL/min — ABNORMAL LOW (ref >90)
Glucose, Bld: 108 mg/dL — ABNORMAL HIGH (ref 70–99)
Total Protein: 8.1 g/dL (ref 6.0–8.3)

## 2012-01-08 LAB — LIPASE, BLOOD: Lipase: 745 U/L — ABNORMAL HIGH (ref 11–59)

## 2012-01-08 MED ORDER — ONDANSETRON HCL 4 MG/2ML IJ SOLN
4.0000 mg | Freq: Once | INTRAMUSCULAR | Status: AC
  Administered 2012-01-08: 4 mg via INTRAVENOUS
  Filled 2012-01-08: qty 2

## 2012-01-08 MED ORDER — HYDROMORPHONE HCL PF 1 MG/ML IJ SOLN
INTRAMUSCULAR | Status: AC
  Filled 2012-01-08: qty 2

## 2012-01-08 MED ORDER — HYDROMORPHONE HCL PF 1 MG/ML IJ SOLN
1.0000 mg | Freq: Once | INTRAMUSCULAR | Status: AC
  Administered 2012-01-08: 1 mg via INTRAVENOUS
  Filled 2012-01-08: qty 1

## 2012-01-08 MED ORDER — ONDANSETRON HCL 4 MG/2ML IJ SOLN
INTRAMUSCULAR | Status: AC
  Filled 2012-01-08: qty 2

## 2012-01-08 MED ORDER — ONDANSETRON HCL 4 MG/2ML IJ SOLN
4.0000 mg | Freq: Once | INTRAMUSCULAR | Status: AC
  Administered 2012-01-08: 4 mg via INTRAMUSCULAR

## 2012-01-08 MED ORDER — HYDROMORPHONE HCL PF 1 MG/ML IJ SOLN
2.0000 mg | Freq: Once | INTRAMUSCULAR | Status: AC
  Administered 2012-01-08: 2 mg via INTRAMUSCULAR

## 2012-01-08 NOTE — ED Provider Notes (Signed)
Chief Complaint  Patient presents with  . Abdominal Pain    History of Present Illness:    [name] is a 46 year old female with a prior history of pancreatitis who presents tonight with a history since yesterday of sudden onset, constant upper abdominal pain with radiation to her back. The pain is sharp and rated 10 over 10 in intensity. It's worse when she eats and has not been relieved by antacids, Tylenol, Advil, or Aleve. The patient has been nauseated and vomited several times without hematemesis, coffee-ground emesis, or bilious emesis. She has been somewhat constipated and felt chilled but not had a fever. She denies any urinary or GYN complaints. She has had several episodes of pancreatitis in the past. The first was 13 years ago and the last tablet last December. The first was thought to be alcohol related. After that she stopped drinking but continued to have episodes of pancreatitis. She has a history of diabetes, hypertension, and coronary artery disease.  Review of Systems:  Other than noted above, the patient denies any of the following symptoms: Constitutional:  No fever, chills, fatigue, weight loss or anorexia. Lungs:  No cough or shortness of breath. Heart:  No chest pain, palpitations, syncope or edema.  No cardiac history. Abdomen:  No nausea, vomiting, hematememesis, melena, diarrhea, or hematochezia. GU:  No dysuria, frequency, urgency, or hematuria. Gyn:  No vaginal discharge, itching, abnormal bleeding, dyspareunia, or pelvic pain.  PMFSH:  Past medical history, family history, social history, meds, and allergies were reviewed along with nurse's notes.  No prior abdominal surgeries, past history of GI problems, STDs or GYN problems.  No history of aspirin or NSAID use.  No excessive alcohol intake.  Physical Exam:   Vital signs:  BP 185/106  Pulse 73  Temp 97.8 F (36.6 C) (Oral)  Resp 18  SpO2 95% Gen:  Alert, oriented, in no distress. Lungs:  Breath sounds clear  and equal bilaterally.  No wheezes, rales or rhonchi. Heart:  Regular rhythm.  No gallops or murmers.   Abdomen:  Abdomen is flat, rigid, and boardlike. She has diffuse tenderness to palpation with guarding. No organomegaly or mass. Bowel sounds are normally active. Skin:  Clear, warm and dry.  No rash.  Labs:   Results for orders placed during the hospital encounter of 01/08/12  CBC WITH DIFFERENTIAL      Component Value Range   WBC 12.5 (*) 4.0 - 10.5 K/uL   RBC 5.54 (*) 3.87 - 5.11 MIL/uL   Hemoglobin 18.2 (*) 12.0 - 15.0 g/dL   HCT 16.1 (*) 09.6 - 04.5 %   MCV 92.2  78.0 - 100.0 fL   MCH 32.9  26.0 - 34.0 pg   MCHC 35.6  30.0 - 36.0 g/dL   RDW 40.9  81.1 - 91.4 %   Platelets 235  150 - 400 K/uL   Neutrophils Relative 77  43 - 77 %   Neutro Abs 9.6 (*) 1.7 - 7.7 K/uL   Lymphocytes Relative 15  12 - 46 %   Lymphs Abs 1.9  0.7 - 4.0 K/uL   Monocytes Relative 7  3 - 12 %   Monocytes Absolute 0.9  0.1 - 1.0 K/uL   Eosinophils Relative 1  0 - 5 %   Eosinophils Absolute 0.1  0.0 - 0.7 K/uL   Basophils Relative 0  0 - 1 %   Basophils Absolute 0.0  0.0 - 0.1 K/uL  COMPREHENSIVE METABOLIC PANEL  Component Value Range   Sodium 137  135 - 145 mEq/L   Potassium 3.5  3.5 - 5.1 mEq/L   Chloride 98  96 - 112 mEq/L   CO2 29  19 - 32 mEq/L   Glucose, Bld 108 (*) 70 - 99 mg/dL   BUN 11  6 - 23 mg/dL   Creatinine, Ser 1.91  0.50 - 1.10 mg/dL   Calcium 47.8 (*) 8.4 - 10.5 mg/dL   Total Protein 8.1  6.0 - 8.3 g/dL   Albumin 4.3  3.5 - 5.2 g/dL   AST 64 (*) 0 - 37 U/L   ALT 78 (*) 0 - 35 U/L   Alkaline Phosphatase 65  39 - 117 U/L   Total Bilirubin 1.0  0.3 - 1.2 mg/dL   GFR calc non Af Amer 80 (*) >90 mL/min   GFR calc Af Amer >90  >90 mL/min  LIPASE, BLOOD      Component Value Range   Lipase 745 (*) 11 - 59 U/L    Course in Urgent Care Center:   She was given Zofran 4 mg IM and Dilaudid 2 mg IM and tolerated this well without any immediate side effects. She had minimal relief of  her pain with these medications.  Assessment:  The encounter diagnosis was Acute pancreatitis.  Plan:   1.  The following meds were prescribed:   New Prescriptions   No medications on file   2.  The patient was transported to the emergency department via shuttle.  Reuben Likes, MD 01/08/12 [phone]

## 2012-01-08 NOTE — ED Notes (Signed)
Pt reports " i think that I have pancretitis"

## 2012-01-08 NOTE — ED Notes (Addendum)
Pt from urgent care with increased lipase. Pt states that abdominal pain since yesterday. Pt states hx of pancreatitis.pt given dilaudid and zofran at urgent care  Pt states took edge off but still hurts lipase was 745, pt has other blood work from urgent care as well in paperwork.

## 2012-01-08 NOTE — ED Notes (Signed)
Pt waiting on ride verification prior to administration of pain meds

## 2012-01-08 NOTE — ED Provider Notes (Signed)
History     CSN: [id number]  Arrival date & time 01/08/12  4098   First MD Initiated Contact with Patient 01/08/12 2303      Chief Complaint  Patient presents with  . Abdominal Pain    (Consider location/radiation/quality/duration/timing/severity/associated sxs/prior treatment) HPI 46-year-old female presents to emergency room from urgent care with complaint of upper abdominal pain with nausea and vomiting. Patient reports symptoms feel like her prior history of pancreatitis. She reports prior episodes of pancreatitis happened 20 years ago, and were associated with drinking liquor. She had a recent episode in December prior to admission. She does report drinking some red wine on Thursday. Still symptoms on Friday with bloating, upper abdominal pain. She's been taking TUMS, Pepto-Bismol and eating bland diet without improvement in symptoms. Patient also reports she's had some constipation, but had a bowel movement prior to arrival today. She was seen at urgent care, and had labs done which showed a significantly elevated lipase. She has not had problems with her gallbladder in the past, has never seen a gastroenterologist. She is in the process of establishing a primary care provider, and reports she actually has an appointment this [facility], Dr. Levora Dredge   Past Medical History  Diagnosis Date  . Loss of weight   . Sarcoidosis   . Leiomyoma of uterus, unspecified   . Pure hypercholesterolemia   . Obesity, unspecified   . Chronic pancreatitis   . Coronary atherosclerosis of unspecified type of vessel, native or graft   . Unspecified sleep apnea   . Type II or unspecified type diabetes mellitus without mention of complication, not stated as uncontrolled     Past Surgical History  Procedure Date  . Coronary angioplasty with stent placement 09/2009    "1"  . Coronary artery bypass graft ~ 2000    CABG X3  . Tubal ligation 1997    Family History  Problem  Relation Age of Onset  . Diabetes Mother   . Coronary artery disease Mother     History  Substance Use Topics  . Smoking status: Current Every Day Smoker -- 1.0 packs/day for 25 years    Types: Cigarettes  . Smokeless tobacco: Never Used  . Alcohol Use: 16.8 oz/week    28 Glasses of wine per week    OB History    Grav Para Term Preterm Abortions TAB SAB Ect Mult Living   3 3 3       3       Review of Systems  All other systems reviewed and are negative.    Allergies  Review of patient's allergies indicates no known allergies.  Home Medications   Current Outpatient Rx  Name Route Sig Dispense Refill  . ASPIRIN 325 MG PO TABS Oral Take 325 mg by mouth daily.      Marland Kitchen CLOPIDOGREL BISULFATE 75 MG PO TABS Oral Take 75 mg by mouth daily.      Marland Kitchen FLUTICASONE-SALMETEROL 250-50 MCG/DOSE IN AEPB Inhalation Inhale 1 puff into the lungs every 12 (twelve) hours.      Marland Kitchen HYDROCHLOROTHIAZIDE 25 MG PO TABS Oral Take 25 mg by mouth daily.      Marland Kitchen LEVONORGESTREL 20 MCG/24HR IU IUD Intrauterine 1 each by Intrauterine route once.      . METFORMIN HCL 500 MG PO TABS Oral Take 500 mg by mouth every evening. Take with food    . METOPROLOL TARTRATE 50 MG PO TABS Oral Take 50 mg by mouth 2 (  two) times daily.    Marland Kitchen PRAVASTATIN SODIUM 20 MG PO TABS Oral Take 20 mg by mouth at bedtime.     . TRAMADOL HCL 50 MG PO TABS Oral Take 1 tablet (50 mg total) by mouth 2 (two) times daily as needed. For pain 60 tablet 0    BP 143/93  Pulse 85  Temp 98.1 F (36.7 C) (Oral)  Resp 18  SpO2 96%  Physical Exam  Nursing note and vitals reviewed. Constitutional: She is oriented to person, place, and time. She appears well-developed and well-nourished.  HENT:  Head: Normocephalic and atraumatic.  Nose: Nose normal.  Mouth/Throat: Oropharynx is clear and moist.  Eyes: Conjunctivae normal and EOM are normal. Pupils are equal, round, and reactive to light.  Neck: Normal range of motion. Neck supple. No JVD  present. No tracheal deviation present. No thyromegaly present.  Cardiovascular: Normal rate, regular rhythm, normal heart sounds and intact distal pulses.  Exam reveals no gallop and no friction rub.   No murmur heard. Pulmonary/Chest: Effort normal and breath sounds normal. No stridor. No respiratory distress. She has no wheezes. She has no rales. She exhibits no tenderness.  Abdominal: Soft. Bowel sounds are normal. She exhibits no distension and no mass. There is tenderness (tenderness in epigastrium without murphy sign). There is no rebound and no guarding.  Musculoskeletal: Normal range of motion. She exhibits no edema and no tenderness.  Lymphadenopathy:    She has no cervical adenopathy.  Neurological: She is alert and oriented to person, place, and time. She exhibits normal muscle tone. Coordination normal.  Skin: Skin is warm and dry. No rash noted. No erythema. No pallor.  Psychiatric: She has a normal mood and affect. Her behavior is normal. Judgment and thought content normal.    ED Course  Procedures (including critical care time)    Results for orders placed during the hospital encounter of 01/08/12  CBC WITH DIFFERENTIAL      Component Value Range   WBC 12.5 (*) 4.0 - 10.5 K/uL   RBC 5.54 (*) 3.87 - 5.11 MIL/uL   Hemoglobin 18.2 (*) 12.0 - 15.0 g/dL   HCT 78.2 (*) 95.6 - 21.3 %   MCV 92.2  78.0 - 100.0 fL   MCH 32.9  26.0 - 34.0 pg   MCHC 35.6  30.0 - 36.0 g/dL   RDW 08.6  57.8 - 46.9 %   Platelets 235  150 - 400 K/uL   Neutrophils Relative 77  43 - 77 %   Neutro Abs 9.6 (*) 1.7 - 7.7 K/uL   Lymphocytes Relative 15  12 - 46 %   Lymphs Abs 1.9  0.7 - 4.0 K/uL   Monocytes Relative 7  3 - 12 %   Monocytes Absolute 0.9  0.1 - 1.0 K/uL   Eosinophils Relative 1  0 - 5 %   Eosinophils Absolute 0.1  0.0 - 0.7 K/uL   Basophils Relative 0  0 - 1 %   Basophils Absolute 0.0  0.0 - 0.1 K/uL  COMPREHENSIVE METABOLIC PANEL      Component Value Range   Sodium 137  135 - 145  mEq/L   Potassium 3.5  3.5 - 5.1 mEq/L   Chloride 98  96 - 112 mEq/L   CO2 29  19 - 32 mEq/L   Glucose, Bld 108 (*) 70 - 99 mg/dL   BUN 11  6 - 23 mg/dL   Creatinine, Ser 6.29  0.50 - 1.10 mg/dL  Calcium 11.0 (*) 8.4 - 10.5 mg/dL   Total Protein 8.1  6.0 - 8.3 g/dL   Albumin 4.3  3.5 - 5.2 g/dL   AST 64 (*) 0 - 37 U/L   ALT 78 (*) 0 - 35 U/L   Alkaline Phosphatase 65  39 - 117 U/L   Total Bilirubin 1.0  0.3 - 1.2 mg/dL   GFR calc non Af Amer 80 (*) >90 mL/min   GFR calc Af Amer >90  >90 mL/min  LIPASE, BLOOD      Component Value Range   Lipase 745 (*) 11 - 59 U/L       1. Acute pancreatitis   2. Chronic pancreatitis   3. Esophageal reflux   4. Obesity, unspecified       MDM  A 46 year old female with pancreatitis, n/v.  U/s done 8 months ago without stones.  She has been unable to tolerate po's, and difficult to control pain.  Will d/w hospitalist for admission.        Olivia Mackie, MD 01/09/12 [phone]

## 2012-01-08 NOTE — ED Notes (Addendum)
45 - RN from UC called to give report on pt. Pt given 2 mg of dilaudid and 4 mg of zofran at UC.

## 2012-01-09 ENCOUNTER — Encounter (HOSPITAL_COMMUNITY): Payer: Self-pay | Admitting: Internal Medicine

## 2012-01-09 DIAGNOSIS — K861 Other chronic pancreatitis: Secondary | ICD-10-CM

## 2012-01-09 DIAGNOSIS — K219 Gastro-esophageal reflux disease without esophagitis: Secondary | ICD-10-CM

## 2012-01-09 DIAGNOSIS — E669 Obesity, unspecified: Secondary | ICD-10-CM

## 2012-01-09 DIAGNOSIS — K859 Acute pancreatitis without necrosis or infection, unspecified: Principal | ICD-10-CM

## 2012-01-09 LAB — CREATININE, SERUM
GFR calc Af Amer: >90 mL/min (ref >90)
GFR calc non Af Amer: 85 mL/min — ABNORMAL LOW (ref >90)

## 2012-01-09 LAB — CBC
HCT: 47.4 % — ABNORMAL HIGH (ref 36.0–46.0)
Platelets: 184 10*3/uL (ref 150–400)
RBC: 5.1 MIL/uL (ref 3.87–5.11)
RDW: 12.7 % (ref 11.5–15.5)
WBC: 6.5 10*3/uL (ref 4.0–10.5)

## 2012-01-09 LAB — GLUCOSE, CAPILLARY: Glucose-Capillary: 136 mg/dL — ABNORMAL HIGH (ref 70–99)

## 2012-01-09 LAB — LIPASE, BLOOD: Lipase: 677 U/L — ABNORMAL HIGH (ref 11–59)

## 2012-01-09 LAB — TSH: TSH: 1.751 u[IU]/mL (ref 0.350–4.500)

## 2012-01-09 MED ORDER — LEVONORGESTREL 20 MCG/24HR IU IUD: 1.0000 | INTRAUTERINE_SYSTEM | Freq: Once | INTRAUTERINE | Status: DC

## 2012-01-09 MED ORDER — DIPHENHYDRAMINE HCL 50 MG/ML IJ SOLN
25.0000 mg | Freq: Once | INTRAMUSCULAR | Status: AC
  Administered 2012-01-09: 25 mg via INTRAVENOUS
  Filled 2012-01-09: qty 1

## 2012-01-09 MED ORDER — LORAZEPAM 0.5 MG PO TABS
1.0000 mg | ORAL_TABLET | Freq: Four times a day (QID) | ORAL | Status: DC | PRN
  Administered 2012-01-10 – 2012-01-11 (×4): 1 mg via ORAL
  Filled 2012-01-09 (×4): qty 2

## 2012-01-09 MED ORDER — ADULT MULTIVITAMIN W/MINERALS CH
1.0000 | ORAL_TABLET | Freq: Every day | ORAL | Status: DC
  Administered 2012-01-09 – 2012-01-11 (×3): 1 via ORAL
  Filled 2012-01-09 (×3): qty 1

## 2012-01-09 MED ORDER — INFLUENZA VIRUS VACC SPLIT PF IM SUSP
0.5000 mL | INTRAMUSCULAR | Status: AC
  Administered 2012-01-10: 0.5 mL via INTRAMUSCULAR
  Filled 2012-01-09: qty 0.5

## 2012-01-09 MED ORDER — CLOPIDOGREL BISULFATE 75 MG PO TABS
75.0000 mg | ORAL_TABLET | Freq: Every day | ORAL | Status: DC
  Administered 2012-01-09 – 2012-01-11 (×3): 75 mg via ORAL
  Filled 2012-01-09 (×4): qty 1

## 2012-01-09 MED ORDER — DOCUSATE SODIUM 100 MG PO CAPS
100.0000 mg | ORAL_CAPSULE | Freq: Two times a day (BID) | ORAL | Status: DC
  Administered 2012-01-09 – 2012-01-11 (×5): 100 mg via ORAL
  Filled 2012-01-09 (×6): qty 1

## 2012-01-09 MED ORDER — VITAMIN B-1 100 MG PO TABS
100.0000 mg | ORAL_TABLET | Freq: Every day | ORAL | Status: DC
  Administered 2012-01-09 – 2012-01-11 (×3): 100 mg via ORAL
  Filled 2012-01-09 (×3): qty 1

## 2012-01-09 MED ORDER — METOPROLOL TARTRATE 50 MG PO TABS
50.0000 mg | ORAL_TABLET | Freq: Two times a day (BID) | ORAL | Status: DC
  Administered 2012-01-09 – 2012-01-11 (×4): 50 mg via ORAL
  Filled 2012-01-09 (×6): qty 1

## 2012-01-09 MED ORDER — THIAMINE HCL 100 MG/ML IJ SOLN
Freq: Once | INTRAVENOUS | Status: AC
  Administered 2012-01-09: 03:00:00 via INTRAVENOUS
  Filled 2012-01-09: qty 1000

## 2012-01-09 MED ORDER — ASPIRIN 325 MG PO TABS
325.0000 mg | ORAL_TABLET | Freq: Every day | ORAL | Status: DC
  Administered 2012-01-09 – 2012-01-11 (×3): 325 mg via ORAL
  Filled 2012-01-09 (×3): qty 1

## 2012-01-09 MED ORDER — LORAZEPAM 2 MG/ML IJ SOLN: 1.0000 mg | Freq: Four times a day (QID) | INTRAMUSCULAR | Status: DC | PRN

## 2012-01-09 MED ORDER — INSULIN ASPART 100 UNIT/ML ~~LOC~~ SOLN
0.0000 [IU] | SUBCUTANEOUS | Status: DC
  Administered 2012-01-09: 1 [IU] via SUBCUTANEOUS

## 2012-01-09 MED ORDER — ONDANSETRON HCL 4 MG PO TABS
4.0000 mg | ORAL_TABLET | Freq: Four times a day (QID) | ORAL | Status: DC | PRN
  Administered 2012-01-11: 4 mg via ORAL
  Filled 2012-01-09: qty 1

## 2012-01-09 MED ORDER — FOLIC ACID 1 MG PO TABS
1.0000 mg | ORAL_TABLET | Freq: Every day | ORAL | Status: DC
  Administered 2012-01-09 – 2012-01-11 (×3): 1 mg via ORAL
  Filled 2012-01-09 (×3): qty 1

## 2012-01-09 MED ORDER — ONDANSETRON HCL 4 MG/2ML IJ SOLN
4.0000 mg | Freq: Four times a day (QID) | INTRAMUSCULAR | Status: DC | PRN
  Administered 2012-01-09 – 2012-01-10 (×4): 4 mg via INTRAVENOUS
  Filled 2012-01-09 (×4): qty 2

## 2012-01-09 MED ORDER — THIAMINE HCL 100 MG/ML IJ SOLN
100.0000 mg | Freq: Every day | INTRAMUSCULAR | Status: DC
  Filled 2012-01-09 (×3): qty 1

## 2012-01-09 MED ORDER — SIMVASTATIN 20 MG PO TABS
20.0000 mg | ORAL_TABLET | Freq: Every day | ORAL | Status: DC
  Administered 2012-01-09 – 2012-01-10 (×2): 20 mg via ORAL
  Filled 2012-01-09 (×3): qty 1

## 2012-01-09 MED ORDER — BIOTENE DRY MOUTH MT LIQD
15.0000 mL | Freq: Two times a day (BID) | OROMUCOSAL | Status: DC
  Administered 2012-01-09 – 2012-01-10 (×4): 15 mL via OROMUCOSAL

## 2012-01-09 MED ORDER — DIPHENHYDRAMINE HCL 50 MG/ML IJ SOLN
12.5000 mg | Freq: Four times a day (QID) | INTRAMUSCULAR | Status: DC | PRN
  Administered 2012-01-09 – 2012-01-10 (×3): 12.5 mg via INTRAVENOUS
  Filled 2012-01-09 (×3): qty 1

## 2012-01-09 MED ORDER — HYDROMORPHONE HCL PF 1 MG/ML IJ SOLN
1.0000 mg | INTRAMUSCULAR | Status: DC | PRN
  Administered 2012-01-09 – 2012-01-10 (×12): 1 mg via INTRAVENOUS
  Filled 2012-01-09 (×12): qty 1

## 2012-01-09 MED ORDER — HEPARIN SODIUM (PORCINE) 5000 UNIT/ML IJ SOLN
5000.0000 [IU] | Freq: Three times a day (TID) | INTRAMUSCULAR | Status: DC
  Administered 2012-01-09 – 2012-01-11 (×7): 5000 [IU] via SUBCUTANEOUS
  Filled 2012-01-09 (×11): qty 1

## 2012-01-09 MED ORDER — FLUTICASONE-SALMETEROL 250-50 MCG/DOSE IN AEPB
1.0000 | INHALATION_SPRAY | Freq: Two times a day (BID) | RESPIRATORY_TRACT | Status: DC
  Administered 2012-01-09 – 2012-01-11 (×5): 1 via RESPIRATORY_TRACT
  Filled 2012-01-09: qty 14

## 2012-01-09 NOTE — H&P (Signed)
[facility] History and Physical  Marie [name] VWU:[id number] DOB: 09/06/1965    PCP:   Marie Prom, NP   Chief Complaint: abdominal pain, nausea and vomiting.  HPI: Marie [name] is an 46 y.o. female with hx of recurrent pancreatitis felt to be secondary to alcohol abuse, no hx of gallstone, drank about one to one and a half bottle of wine daily, Hx of CAD, pulmonary sarcoidosis, presents to the ER with epigastric pain, nausea and vomiting with no fever or chills.  She denied any shortness of breath or chest pain.  Evaluation in the ER included a lipase in the 700's with normal unremarkable LFTs. She has a leukocytosis of 12K, and Hb of 18.2g/DL.  She has normal hemodynamics and hospitalist was asked to admit her for acute on chronic alcoholic pancreatitis.  Rewiew of Systems:  Constitutional: Negative for malaise, fever and chills. No significant weight loss or weight gain Eyes: Negative for eye pain, redness and discharge, diplopia, visual changes, or flashes of light. ENMT: Negative for ear pain, hoarseness, nasal congestion, sinus pressure and sore throat. No headaches; tinnitus, drooling, or problem swallowing. Cardiovascular: Negative for chest pain, palpitations, diaphoresis, dyspnea and peripheral edema. ; No orthopnea, PND Respiratory: Negative for cough, hemoptysis, wheezing and stridor. No pleuritic chestpain. Gastrointestinal: Negative for nausea, vomiting, diarrhea, constipation, melena, blood in stool, hematemesis, jaundice and rectal bleeding.    Genitourinary: Negative for frequency, dysuria, incontinence,flank pain and hematuria; Musculoskeletal: Negative for back pain and neck pain. Negative for swelling and trauma.;  Skin: . Negative for pruritus, rash, abrasions, bruising and skin lesion.; ulcerations Neuro: Negative for headache, lightheadedness and neck stiffness. Negative for weakness, altered level of consciousness , altered mental status, extremity weakness,  burning feet, involuntary movement, seizure and syncope.  Psych: negative for anxiety, depression, insomnia, tearfulness, panic attacks, hallucinations, paranoia, suicidal or homicidal ideation    Past Medical History  Diagnosis Date  . Loss of weight   . Sarcoidosis   . Leiomyoma of uterus, unspecified   . Pure hypercholesterolemia   . Obesity, unspecified   . Chronic pancreatitis   . Coronary atherosclerosis of unspecified type of vessel, native or graft   . Unspecified sleep apnea   . Type II or unspecified type diabetes mellitus without mention of complication, not stated as uncontrolled   . Shortness of breath     Past Surgical History  Procedure Date  . Coronary angioplasty with stent placement 09/2009    "1"  . Coronary artery bypass graft ~ 2000    CABG X3  . Tubal ligation 1997    Medications:  HOME MEDS: Prior to Admission medications   Medication Sig Start Date End Date Taking? Authorizing Provider  aspirin 325 MG tablet Take 325 mg by mouth daily.     Yes Historical Provider, MD  clopidogrel (PLAVIX) 75 MG tablet Take 75 mg by mouth daily.     Yes Historical Provider, MD  Fluticasone-Salmeterol (ADVAIR DISKUS) 250-50 MCG/DOSE AEPB Inhale 1 puff into the lungs every 12 (twelve) hours.     Yes Historical Provider, MD  hydrochlorothiazide 25 MG tablet Take 25 mg by mouth daily.     Yes Historical Provider, MD  levonorgestrel (MIRENA) 20 MCG/24HR IUD 1 each by Intrauterine route once.     Yes Historical Provider, MD  metFORMIN (GLUCOPHAGE) 500 MG tablet Take 500 mg by mouth every evening. Take with food   Yes Historical Provider, MD  metoprolol (LOPRESSOR) 50 MG tablet Take 50 mg by mouth 2 (two)  times daily.   Yes Historical Provider, MD  pravastatin (PRAVACHOL) 20 MG tablet Take 20 mg by mouth at bedtime.    Yes Historical Provider, MD  traMADol (ULTRAM) 50 MG tablet Take 1 tablet (50 mg total) by mouth 2 (two) times daily as needed. For pain 03/25/11  Yes Altha Harm, MD     Allergies:  No Known Allergies  Social History:   reports that she has been smoking Cigarettes.  She has a 12.5 pack-year smoking history. She has never used smokeless tobacco. She reports that she drinks about 16.8 ounces of alcohol per week. She reports that she does not use illicit drugs.  Family History: Family History  Problem Relation Age of Onset  . Diabetes Mother   . Coronary artery disease Mother      Physical Exam: Filed Vitals:   01/08/12 2236 01/08/12 2349 01/09/12 0130 01/09/12 0239  BP: 143/93 144/88 126/75 150/89  Pulse: 85 80 79 80  Temp: 98.1 F (36.7 C)   98.4 F (36.9 C)  TempSrc: Oral   Oral  Resp: 18   18  Height:    5\' 2"  (1.575 m)  Weight:    69.536 kg (153 lb 4.8 oz)  SpO2: 96% 97% 95% 99%   Blood pressure 150/89, pulse 80, temperature 98.4 F (36.9 C), temperature source Oral, resp. rate 18, height 5\' 2"  (1.575 m), weight 69.536 kg (153 lb 4.8 oz), last menstrual period 12/25/2011, SpO2 99.00%.  GEN:  Pleasant  patient lying in the stretcher in no acute distress; cooperative with exam. PSYCH:  alert and oriented x4; does not appear anxious or depressed; affect is appropriate. HEENT: Mucous membranes pink and anicteric; PERRLA; EOM intact; no cervical lymphadenopathy nor thyromegaly or carotid bruit; no JVD; There were no stridor. Neck is very supple. Breasts:: Not examined CHEST WALL: No tenderness CHEST: Normal respiration, clear to auscultation bilaterally.  HEART: Regular rate and rhythm.  There are no murmur, rub, or gallops.   BACK: No kyphosis or scoliosis; no CVA tenderness ABDOMEN: soft and slightly tender in the epigastrium; no masses, no organomegaly, normal abdominal bowel sounds; no pannus; no intertriginous candida. There is no rebound and no distention. Rectal Exam: Not done EXTREMITIES: No bone or joint deformity; age-appropriate arthropathy of the hands and knees; no edema; no ulcerations.  There is no calf  tenderness. Genitalia: not examined PULSES: 2+ and symmetric SKIN: Normal hydration no rash or ulceration CNS: Cranial nerves 2-12 grossly intact no focal lateralizing neurologic deficit.  Speech is fluent; uvula elevated with phonation, facial symmetry and tongue midline. DTR are normal bilaterally, cerebella exam is intact, barbinski is negative and strengths are equaled bilaterally.  No sensory loss.   Labs on Admission:  Basic Metabolic Panel:  Lab 01/08/12 2130  NA 137  K 3.5  CL 98  CO2 29  GLUCOSE 108*  BUN 11  CREATININE 0.86  CALCIUM 11.0*  MG --  PHOS --   Liver Function Tests:  Lab 01/08/12 1740  AST 64*  ALT 78*  ALKPHOS 65  BILITOT 1.0  PROT 8.1  ALBUMIN 4.3    Lab 01/08/12 1740  LIPASE 745*  AMYLASE --   No results found for this basename: AMMONIA:5 in the last 168 hours CBC:  Lab 01/08/12 1740  WBC 12.5*  NEUTROABS 9.6*  HGB 18.2*  HCT 51.1*  MCV 92.2  PLT 235   Cardiac Enzymes: No results found for this basename: CKTOTAL:5,CKMB:5,CKMBINDEX:5,TROPONINI:5 in the last 168 hours  CBG:  Lab 01/09/12 0233  GLUCAP 135*     Radiological Exams on Admission: No results found.   Assessment/Plan Present on Admission:  .PANCREATITIS, CHRONIC .Acute pancreatitis .BRONCHITIS, OBSTRUCTIVE CHRONIC .ACID REFLUX DISEASE .DIABETES MELLITUS, TYPE II .HYPERCHOLESTEROLEMIA .OBESITY .TOBACCO ABUSE   PLAN: Acute on chronic alcoholic pancreatitis.  She will be admitted for IV pain meds, IVF, and antiemetics.  She is at risk for major alcohol withdrawal, and will be placed on CIWA protocol.  She never had DT or prior alcohol withdrawal.  For her DM, will give SSI, and D5 NS as she is on clear liquid.  I have continued her home meds.  She is encouraged to quit smoking as well.  She is stable, full code, and will be admitted to telemetry under [facility] service.  Other plans as per orders.  Code Status: FULL Unk Lightning, MD. [facility] Pager  [phone] 7pm to 7am.  01/09/2012, 5:57 AM

## 2012-01-09 NOTE — Progress Notes (Signed)
[facility] PROGRESS NOTE  Marie [name] ZOX:[id number] DOB: 03-07-1966 DOA: 01/08/2012 PCP: Lehman Prom, NP  Assessment/Plan:  1. Acute on chronic pancreatitis. Likely related to ETOH. Lipase 745 on admission. Repeat pending. Pt much improved this am. Will advance to clear liquids. Will continue IV pain meds and antiemetics. Monitor  2. ETOH abuse. Reports drinking 3 glasses of wine daily and has done so for decades. Continue CIWA. No s/sx of withdrawal.   3. DM: fair control. Will check A1c. Continue SSI. Holding metformin for now.  4. HTN: fair control. SBP range 122-150. Holding HCTZ. Continuing BB will monitor  5. CAD: no chest pain. Continue plavix  6. Hypercholesterolemia: continue statin  7. Sarcoidosis, pulmonary: at baseline. Continue home meds. monitor  Code Status: full Family Communication: patient at bedside Disposition Plan: home when stable   Consultants:  none  Procedures:  none Antibiotics:  none  HPI/Subjective: Ambulating in room without problem. Reports improved pain. Minimal nausea  Objective: Filed Vitals:   01/08/12 2349 01/09/12 0130 01/09/12 0239 01/09/12 0648  BP: 144/88 126/75 150/89 125/72  Pulse: 80 79 80 77  Temp:   98.4 F (36.9 C) 97.5 F (36.4 C)  TempSrc:   Oral Oral  Resp:   18 18  Height:   5\' 2"  (1.575 m)   Weight:   69.536 kg (153 lb 4.8 oz)   SpO2: 97% 95% 99% 92%    Intake/Output Summary (Last 24 hours) at 01/09/12 0959 Last data filed at 01/09/12 0958  Gross per 24 hour  Intake  362.5 ml  Output      0 ml  Net  362.5 ml   Filed Weights   01/09/12 0239  Weight: 69.536 kg (153 lb 4.8 oz)    Exam:   General:  Well nourished, NAD  Cardiovascular: RRR no MGR No LEE PPP  Respiratory: Normal effort. BSCTAB no wheeze, rhonchi  Abdomen: obese, soft mild tenderness especially upper left quadrant. Very sluggish BS  Data Reviewed: Basic Metabolic Panel:  Lab 01/09/12 8119 01/08/12 1740  NA -- 137   K -- 3.5  CL -- 98  CO2 -- 29  GLUCOSE -- 108*  BUN -- 11  CREATININE 0.82 0.86  CALCIUM -- 11.0*  MG -- --  PHOS -- --   Liver Function Tests:  Lab 01/08/12 1740  AST 64*  ALT 78*  ALKPHOS 65  BILITOT 1.0  PROT 8.1  ALBUMIN 4.3    Lab 01/08/12 1740  LIPASE 745*  AMYLASE --   No results found for this basename: AMMONIA:5 in the last 168 hours CBC:  Lab 01/09/12 0540 01/08/12 1740  WBC 6.5 12.5*  NEUTROABS -- 9.6*  HGB 16.2* 18.2*  HCT 47.4* 51.1*  MCV 92.9 92.2  PLT 184 235   Cardiac Enzymes: No results found for this basename: CKTOTAL:5,CKMB:5,CKMBINDEX:5,TROPONINI:5 in the last 168 hours BNP (last 3 results) No results found for this basename: PROBNP:3 in the last 8760 hours CBG:  Lab 01/09/12 0449 01/09/12 0233  GLUCAP 136* 135*    No results found for this or any previous visit (from the past 240 hour(s)).   Studies: No results found.  Scheduled Meds:   . antiseptic oral rinse  15 mL Mouth Rinse BID  . aspirin  325 mg Oral Daily  . clopidogrel  75 mg Oral Q breakfast  . general admission iv infusion   Intravenous Once  . diphenhydrAMINE  25 mg Intravenous Once  . diphenhydrAMINE  25 mg Intravenous Once  .  docusate sodium  100 mg Oral BID  . Fluticasone-Salmeterol  1 puff Inhalation Q12H  . folic acid  1 mg Oral Daily  . heparin  5,000 Units Subcutaneous Q8H  .  HYDROmorphone (DILAUDID) injection  1 mg Intravenous Once  . influenza  inactive virus vaccine  0.5 mL Intramuscular Tomorrow-1000  . insulin aspart  0-9 Units Subcutaneous Q4H  . metoprolol  50 mg Oral BID  . multivitamin with minerals  1 tablet Oral Daily  . ondansetron (ZOFRAN) IV  4 mg Intravenous Once  . simvastatin  20 mg Oral q1800  . thiamine  100 mg Oral Daily   Or  . thiamine  100 mg Intravenous Daily  . DISCONTD: levonorgestrel  1 each Intrauterine Once   Continuous Infusions:   Active Problems:  DIABETES MELLITUS, TYPE II  HYPERCHOLESTEROLEMIA  OBESITY  TOBACCO  ABUSE  ACID REFLUX DISEASE  PANCREATITIS, CHRONIC  BRONCHITIS, OBSTRUCTIVE CHRONIC  Acute pancreatitis    Time spent: 30 minutes    [facility] M  [facility]  If 8PM-8AM, please contact night-coverage at [email or web address], password [facility] 01/09/2012, 9:59 AM  LOS: 1 day

## 2012-01-09 NOTE — Progress Notes (Signed)
Utilization Review Completed.  

## 2012-01-09 NOTE — Progress Notes (Signed)
Patient seen and examined by me.  Agree with plan- feeling better- hope for d/c soon  Marlin Canary DO

## 2012-01-10 DIAGNOSIS — F17200 Nicotine dependence, unspecified, uncomplicated: Secondary | ICD-10-CM

## 2012-01-10 LAB — BASIC METABOLIC PANEL
BUN: 9 mg/dL (ref 6–23)
CO2: 30 mEq/L (ref 19–32)
Calcium: 9.4 mg/dL (ref 8.4–10.5)
Chloride: 98 mEq/L (ref 96–112)
Creatinine, Ser: 0.98 mg/dL (ref 0.50–1.10)
GFR calc Af Amer: 80 mL/min — ABNORMAL LOW (ref >90)

## 2012-01-10 LAB — GLUCOSE, CAPILLARY
Glucose-Capillary: 74 mg/dL (ref 70–99)
Glucose-Capillary: 77 mg/dL (ref 70–99)
Glucose-Capillary: 91 mg/dL (ref 70–99)
Glucose-Capillary: 88 mg/dL (ref 70–99)
Glucose-Capillary: 92 mg/dL (ref 70–99)

## 2012-01-10 LAB — CBC
HCT: 46.1 % — ABNORMAL HIGH (ref 36.0–46.0)
MCH: 32 pg (ref 26.0–34.0)
MCV: 95.6 fL (ref 78.0–100.0)
RDW: 13 % (ref 11.5–15.5)
WBC: 5.7 10*3/uL (ref 4.0–10.5)

## 2012-01-10 MED ORDER — INSULIN ASPART 100 UNIT/ML ~~LOC~~ SOLN: 0.0000 [IU] | Freq: Every day | SUBCUTANEOUS | Status: DC

## 2012-01-10 MED ORDER — HYDROMORPHONE HCL PF 1 MG/ML IJ SOLN
1.0000 mg | INTRAMUSCULAR | Status: DC | PRN
  Administered 2012-01-10 (×3): 1 mg via INTRAVENOUS
  Filled 2012-01-10 (×3): qty 1

## 2012-01-10 MED ORDER — INSULIN ASPART 100 UNIT/ML ~~LOC~~ SOLN
0.0000 [IU] | Freq: Three times a day (TID) | SUBCUTANEOUS | Status: DC
Start: 2012-01-11 — End: 2012-01-11

## 2012-01-10 MED ORDER — HYDROMORPHONE HCL PF 1 MG/ML IJ SOLN
0.5000 mg | INTRAMUSCULAR | Status: DC | PRN
  Administered 2012-01-10 – 2012-01-11 (×4): 0.5 mg via INTRAVENOUS
  Filled 2012-01-10 (×4): qty 1

## 2012-01-10 MED ORDER — SODIUM CHLORIDE 0.9 % IV SOLN
INTRAVENOUS | Status: DC
  Administered 2012-01-10 (×2): via INTRAVENOUS

## 2012-01-10 NOTE — Progress Notes (Signed)
[facility] PROGRESS NOTE  Marie [name] ZOX:[id number] DOB: 1966/02/10 DOA: 01/08/2012 PCP: Lehman Prom, NP  Assessment/Plan: 1. Acute on chronic pancreatitis. Likely related to ETOH. Lipase 745 on admission. Repeat 677. Pt continues to improve slowly.  Will advance to full liquids. Will continue IV pain meds but will increase frequency. Continue antiemetics. Monitor  2. ETOH abuse. Reports drinking 3 glasses of wine daily and has done so for decades. Continue CIWA. No s/sx of withdrawal.  3. DM: good control. A1c 6.3.  CBG 94, 74. Continue SSI. Holding metformin for now.  4. HTN:  control. SBP range 95-104. Will increase frequency of IV dilaudid to q3hr prn. Holding HCTZ. Continuing BB will monitor  5. CAD: no chest pain. Continue plavix  6. Hypercholesterolemia: continue statin  7. Sarcoidosis, pulmonary:  Temp 99.1, 100.1. White count within normal range. Will provide incentive spirometry q4hr while awake. Pt is a smoker. Continue home meds. monitor     Code Status: full Family Communication: patient at bedside Disposition Plan: home hopefully tomorrow. If tolerates full liquid this am will advance this evening. Will transition pain med to po as well.    Consultants:  none  Procedures:  none  Antibiotics:  none  HPI/Subjective: Awake sitting up in bed watching TV. States nausea "just a little" and pain better and "can go 3 hours without pain med". Tolerating clear liquids.   Objective: Filed Vitals:   01/10/12 0215 01/10/12 0220 01/10/12 0245 01/10/12 0641  BP: 95/59 100/60  96/57  Pulse: 75   78  Temp: 100.1 F (37.8 C)  99.3 F (37.4 C) 99.5 F (37.5 C)  TempSrc: Oral   Oral  Resp: 14   16  Height:      Weight:    71.124 kg (156 lb 12.8 oz)  SpO2: 94%   92%    Intake/Output Summary (Last 24 hours) at 01/10/12 0736 Last data filed at 01/09/12 1740  Gross per 24 hour  Intake    600 ml  Output     75 ml  Net    525 ml   Filed Weights   01/09/12  0239 01/10/12 0641  Weight: 69.536 kg (153 lb 4.8 oz) 71.124 kg (156 lb 12.8 oz)    Exam:   General:  Well nourished, NAD  Cardiovascular: RRR. No MGR No LEE PPP  Respiratory: normal effort. BSCTAB No wheeze/rhonchi somewhat coarse  Abdomen: obese, soft, mild tenderness to palpation upper left quadrant. +BS  Data Reviewed: Basic Metabolic Panel:  Lab 01/10/12 8119 01/09/12 0540 01/08/12 1740  NA 137 -- 137  K 3.7 -- 3.5  CL 98 -- 98  CO2 30 -- 29  GLUCOSE 95 -- 108*  BUN 9 -- 11  CREATININE 0.98 0.82 0.86  CALCIUM 9.4 -- 11.0*  MG -- -- --  PHOS -- -- --   Liver Function Tests:  Lab 01/08/12 1740  AST 64*  ALT 78*  ALKPHOS 65  BILITOT 1.0  PROT 8.1  ALBUMIN 4.3    Lab 01/09/12 0540 01/08/12 1740  LIPASE 677* 745*  AMYLASE -- --   No results found for this basename: AMMONIA:5 in the last 168 hours CBC:  Lab 01/10/12 0430 01/09/12 0540 01/08/12 1740  WBC 5.7 6.5 12.5*  NEUTROABS -- -- 9.6*  HGB 15.4* 16.2* 18.2*  HCT 46.1* 47.4* 51.1*  MCV 95.6 92.9 92.2  PLT 189 184 235   Cardiac Enzymes: No results found for this basename: CKTOTAL:5,CKMB:5,CKMBINDEX:5,TROPONINI:5 in the last 168 hours BNP (last  3 results) No results found for this basename: PROBNP:3 in the last 8760 hours CBG:  Lab 01/10/12 0045 01/09/12 2017 01/09/12 1625 01/09/12 1110 01/09/12 1014  GLUCAP 94 74 78 91 107*    No results found for this or any previous visit (from the past 240 hour(s)).   Studies: No results found.  Scheduled Meds:   . antiseptic oral rinse  15 mL Mouth Rinse BID  . aspirin  325 mg Oral Daily  . clopidogrel  75 mg Oral Q breakfast  . docusate sodium  100 mg Oral BID  . Fluticasone-Salmeterol  1 puff Inhalation Q12H  . folic acid  1 mg Oral Daily  . heparin  5,000 Units Subcutaneous Q8H  . influenza  inactive virus vaccine  0.5 mL Intramuscular Tomorrow-1000  . insulin aspart  0-9 Units Subcutaneous Q4H  . metoprolol  50 mg Oral BID  . multivitamin  with minerals  1 tablet Oral Daily  . simvastatin  20 mg Oral q1800  . thiamine  100 mg Oral Daily   Or  . thiamine  100 mg Intravenous Daily   Continuous Infusions:   Principal Problem:  *Acute pancreatitis Active Problems:  DIABETES MELLITUS, TYPE II  HYPERCHOLESTEROLEMIA  OBESITY  TOBACCO ABUSE  ACID REFLUX DISEASE  PANCREATITIS, CHRONIC  BRONCHITIS, OBSTRUCTIVE CHRONIC    Time spent: 30 minutes    Gwenyth Bender NP [facility]  If 8PM-8AM, please contact night-coverage at [email or web address], password [facility] 01/10/2012, 7:36 AM  LOS: 2 days

## 2012-01-10 NOTE — Progress Notes (Signed)
Patient feeling better today.  Nausea improved, tolerating clears.  Patient seen and examined by me.  Hope for D/C tomm.  Marlin Canary DO

## 2012-01-11 DIAGNOSIS — E876 Hypokalemia: Secondary | ICD-10-CM

## 2012-01-11 DIAGNOSIS — J449 Chronic obstructive pulmonary disease, unspecified: Secondary | ICD-10-CM

## 2012-01-11 LAB — GLUCOSE, CAPILLARY: Glucose-Capillary: 139 mg/dL — ABNORMAL HIGH (ref 70–99)

## 2012-01-11 LAB — CBC
Hemoglobin: 15.5 g/dL — ABNORMAL HIGH (ref 12.0–15.0)
MCH: 31.8 pg (ref 26.0–34.0)
MCHC: 33.5 g/dL (ref 30.0–36.0)
MCV: 94.7 fL (ref 78.0–100.0)
RBC: 4.88 MIL/uL (ref 3.87–5.11)

## 2012-01-11 LAB — BASIC METABOLIC PANEL
BUN: 5 mg/dL — ABNORMAL LOW (ref 6–23)
CO2: 26 mEq/L (ref 19–32)
Calcium: 8.9 mg/dL (ref 8.4–10.5)
Chloride: 101 mEq/L (ref 96–112)
Creatinine, Ser: 0.88 mg/dL (ref 0.50–1.10)
Glucose, Bld: 99 mg/dL (ref 70–99)

## 2012-01-11 MED ORDER — DSS 100 MG PO CAPS: 100.0000 mg | ORAL_CAPSULE | Freq: Two times a day (BID) | ORAL | Status: DC

## 2012-01-11 MED ORDER — ONDANSETRON HCL 4 MG PO TABS: 4.0000 mg | ORAL_TABLET | Freq: Four times a day (QID) | ORAL | Status: DC | PRN

## 2012-01-11 MED ORDER — LORAZEPAM 0.5 MG PO TABS: 0.5000 mg | ORAL_TABLET | Freq: Four times a day (QID) | ORAL | Status: DC | PRN

## 2012-01-11 MED ORDER — HYDROCODONE-ACETAMINOPHEN 5-325 MG PO TABS: 1.0000 | ORAL_TABLET | Freq: Four times a day (QID) | ORAL | Status: DC | PRN

## 2012-01-11 MED ORDER — ADULT MULTIVITAMIN W/MINERALS CH: 1.0000 | ORAL_TABLET | Freq: Every day | ORAL | Status: DC

## 2012-01-11 NOTE — Progress Notes (Signed)
Patient discharged with all belongings and verbalizes understanding of discharge paperwork. Anastasia Fiedler RN

## 2012-01-11 NOTE — Discharge Summary (Signed)
Physician Discharge Summary  Marie [name]:[id number] DOB: 13-Nov-1965 DOA: 01/08/2012  PCP: Lehman Prom, NP  Admit date: 01/08/2012 Discharge date: 01/11/2012  Recommendations for Outpatient Follow-up:  1. Has appointment with new PCP at [facility] 01/14/12.   Discharge Diagnoses:  Principal Problem:  *Acute pancreatitis Active Problems:  DIABETES MELLITUS, TYPE II  HYPERCHOLESTEROLEMIA  OBESITY  TOBACCO ABUSE  ACID REFLUX DISEASE  PANCREATITIS, CHRONIC  BRONCHITIS, OBSTRUCTIVE CHRONIC   Discharge Condition: medically stable and ready for discharge to home.   Diet recommendation: carb modified  Filed Weights   01/09/12 0239 01/10/12 0641 01/11/12 0547  Weight: 69.536 kg (153 lb 4.8 oz) 71.124 kg (156 lb 12.8 oz) 72.303 kg (159 lb 6.4 oz)    History of present illness:  Marie [name] is an 46 y.o. female with hx of recurrent pancreatitis felt to be secondary to alcohol abuse, no hx of gallstone, drank about one to one and a half bottle of wine daily, Hx of CAD, pulmonary sarcoidosis, presented to the ER 01/09/12 with epigastric pain, nausea and vomiting with no fever or chills. She denied any shortness of breath or chest pain. Evaluation in the ER included a lipase in the 700's with normal unremarkable LFTs. She had a leukocytosis of 12K, and Hb of 18.2g/DL. She had normal hemodynamics and hospitalist were asked to admit her for acute on chronic alcoholic pancreatitis   Hospital Course:   1. Acute on chronic pancreatitis. Likely related to ETOH but pt reports no drinking. Lipase 745 on admission. Repeat 677. On day of discharge lipase 134. Pt admitted to tele and managed with dilaudid,zofran and NPO. 10/17 diet advanced and on day of discharge pt tolerating carb modified diet without nausea and very little pain.  2. ETOH abuse. Reports drinking 3 glasses of wine daily and has done so for decades. Placed on CIWA protocol. No s/sx of withdrawal of withdrawal during this  hospitalization.  3. DM: good control. A1c 6.3. CBG 94, 74. Metformin initially held and SSI used to manage DM. At discharge metformin resumed.   4. HTN: control. SBP range 95-104. Initially held HCTZ but continued BB. At discharge SBP range 126-140. Will resume HCTZ  5. CAD: no chest pain. Continue plavix  6. Hypercholesterolemia: continue statin  7. Sarcoidosis, pulmonary:  On 01/10/12 temp 99.1, 100.1. White count within normal range. Incentive spirometry provided, ambulation in hall. Pt is smoker. At discharge afebrile with normal white count. Continue home meds      Procedures:  none  Consultations:  none Discharge Exam: Filed Vitals:   01/10/12 2153 01/11/12 0010 01/11/12 0354 01/11/12 0547  BP: 124/68   122/76  Pulse: 99 90 86 87  Temp: 100.1 F (37.8 C)   98.6 F (37 C)  TempSrc: Oral   Oral  Resp:    18  Height:      Weight:    72.303 kg (159 lb 6.4 oz)  SpO2: 93%   96%    General: ambulating in room. NAD Cardiovascular: RRR No MGR No LEE Respiratory: normal effort. BSCTAB No wheeze/rhonchi Abdomen: obese soft non-tender +BS  Discharge Instructions  Discharge Orders    Future Orders Please Complete By Expires   Diet - low sodium heart healthy      Increase activity slowly      Call MD for:  persistant nausea and vomiting      Call MD for:  difficulty breathing, headache or visual disturbances      Call MD for:  persistant dizziness or  light-headedness          Medication List     As of 01/11/2012  8:55 AM    TAKE these medications         ADVAIR DISKUS 250-50 MCG/DOSE Aepb   Generic drug: Fluticasone-Salmeterol   Inhale 1 puff into the lungs every 12 (twelve) hours.      aspirin 325 MG tablet   Take 325 mg by mouth daily.      clopidogrel 75 MG tablet   Commonly known as: PLAVIX   Take 75 mg by mouth daily.      DSS 100 MG Caps   Take 100 mg by mouth 2 (two) times daily.      hydrochlorothiazide 25 MG tablet   Commonly known as:  HYDRODIURIL   Take 25 mg by mouth daily.      HYDROcodone-acetaminophen 5-325 MG per tablet   Commonly known as: NORCO/VICODIN   Take 1 tablet by mouth every 6 (six) hours as needed for pain.      levonorgestrel 20 MCG/24HR IUD   Commonly known as: MIRENA   1 each by Intrauterine route once.      metFORMIN 500 MG tablet   Commonly known as: GLUCOPHAGE   Take 500 mg by mouth every evening. Take with food      metoprolol 50 MG tablet   Commonly known as: LOPRESSOR   Take 50 mg by mouth 2 (two) times daily.      multivitamin with minerals Tabs   Take 1 tablet by mouth daily.      ondansetron 4 MG tablet   Commonly known as: ZOFRAN   Take 1 tablet (4 mg total) by mouth every 6 (six) hours as needed for nausea.      pravastatin 20 MG tablet   Commonly known as: PRAVACHOL   Take 20 mg by mouth at bedtime.      traMADol 50 MG tablet   Commonly known as: ULTRAM   Take 1 tablet (50 mg total) by mouth 2 (two) times daily as needed. For pain           Follow-up Information    Follow up with MARTIN,NYKEDTRA, NP. (Has appointment with new PCP Monday 01/14/12 with [facility])    Contact information:   [facility] [address] [place] [address] [phone]           The results of significant diagnostics from this hospitalization (including imaging, microbiology, ancillary and laboratory) are listed below for reference.    Significant Diagnostic Studies: No results found.  Microbiology: No results found for this or any previous visit (from the past 240 hour(s)).   Labs: Basic Metabolic Panel:  Lab 01/11/12 5621 01/10/12 0430 01/09/12 0540 01/08/12 1740  NA 138 137 -- 137  K 3.5 3.7 -- 3.5  CL 101 98 -- 98  CO2 26 30 -- 29  GLUCOSE 99 95 -- 108*  BUN 5* 9 -- 11  CREATININE 0.88 0.98 0.82 0.86  CALCIUM 8.9 9.4 -- 11.0*  MG -- -- -- --  PHOS -- -- -- --   Liver Function Tests:  Lab 01/08/12 1740  AST 64*  ALT 78*  ALKPHOS 65  BILITOT  1.0  PROT 8.1  ALBUMIN 4.3    Lab 01/11/12 0650 01/09/12 0540 01/08/12 1740  LIPASE 134* 677* 745*  AMYLASE -- -- --   No results found for this basename: AMMONIA:5 in the last 168 hours CBC:  Lab 01/11/12 0650  01/10/12 0430 01/09/12 0540 01/08/12 1740  WBC 5.9 5.7 6.5 12.5*  NEUTROABS -- -- -- 9.6*  HGB 15.5* 15.4* 16.2* 18.2*  HCT 46.2* 46.1* 47.4* 51.1*  MCV 94.7 95.6 92.9 92.2  PLT 184 189 184 235   Cardiac Enzymes: No results found for this basename: CKTOTAL:5,CKMB:5,CKMBINDEX:5,TROPONINI:5 in the last 168 hours BNP: BNP (last 3 results) No results found for this basename: PROBNP:3 in the last 8760 hours CBG:  Lab 01/11/12 0601 01/10/12 2158 01/10/12 1616 01/10/12 1059 01/10/12 0812  GLUCAP 105* 110* 92 88 91    Time coordinating discharge: 40 minutes  Signed:  Gwenyth Bender NP [facility] 01/11/2012, 8:55 AM

## 2012-01-11 NOTE — Discharge Summary (Signed)
Patient to be d/c'd today.  Instructed no driving while taking pain medications.  Avoid alcohol.    Marlin Canary DO

## 2012-02-25 ENCOUNTER — Other Ambulatory Visit: Payer: Self-pay | Admitting: Family Medicine

## 2012-02-25 DIAGNOSIS — Z1231 Encounter for screening mammogram for malignant neoplasm of breast: Secondary | ICD-10-CM

## 2012-04-03 ENCOUNTER — Ambulatory Visit
Admission: RE | Admit: 2012-04-03 | Discharge: 2012-04-03 | Disposition: A | Discharge status: Home / Self Care | Payer: 59 | Source: Ambulatory Visit | Attending: Family Medicine | Admitting: Family Medicine

## 2012-04-03 DIAGNOSIS — Z1231 Encounter for screening mammogram for malignant neoplasm of breast: Secondary | ICD-10-CM

## 2012-06-18 ENCOUNTER — Ambulatory Visit: Payer: 59 | Admitting: Advanced Practice Midwife

## 2012-08-04 ENCOUNTER — Encounter: Payer: Self-pay | Admitting: Obstetrics and Gynecology

## 2012-08-04 ENCOUNTER — Other Ambulatory Visit (HOSPITAL_COMMUNITY)
Admission: RE | Admit: 2012-08-04 | Discharge: 2012-08-04 | Disposition: A | Discharge status: Home / Self Care | Payer: 59 | Source: Ambulatory Visit | Attending: Obstetrics and Gynecology | Admitting: Obstetrics and Gynecology

## 2012-08-04 ENCOUNTER — Ambulatory Visit (INDEPENDENT_AMBULATORY_CARE_PROVIDER_SITE_OTHER): Payer: 59 | Admitting: Obstetrics and Gynecology

## 2012-08-04 VITALS — BP 147/96 | HR 89 | Ht 62.0 in | Wt 150.8 lb

## 2012-08-04 DIAGNOSIS — D259 Leiomyoma of uterus, unspecified: Secondary | ICD-10-CM

## 2012-08-04 DIAGNOSIS — N939 Abnormal uterine and vaginal bleeding, unspecified: Secondary | ICD-10-CM

## 2012-08-04 DIAGNOSIS — N926 Irregular menstruation, unspecified: Secondary | ICD-10-CM | POA: Insufficient documentation

## 2012-08-04 DIAGNOSIS — Z01812 Encounter for preprocedural laboratory examination: Secondary | ICD-10-CM

## 2012-08-04 LAB — POCT PREGNANCY, URINE: Preg Test, Ur: NEGATIVE

## 2012-08-04 NOTE — Progress Notes (Signed)
Subjective:    Patient ID: Marie [name], female    DOB: [birth year], 47 y.o.   MRN: [id number]  HPI 47 yo G3P3 presenting today for further management of abnormal uterine bleeding. Patient describes a monthly cycle that is heavy with passage of clots lasting 8-10 days. Patient states that this bleeding pattern has been going on for years and is the reason why she had an IUD inserted in 2012. Patient reports no change in bleeding pattern since IUD has been inserted and is requesting further intervention. Patient is otherwise without complaints  Past Medical History  Diagnosis Date  . Loss of weight   . Sarcoidosis   . Leiomyoma of uterus, unspecified   . Pure hypercholesterolemia   . Obesity, unspecified   . Chronic pancreatitis   . Coronary atherosclerosis of unspecified type of vessel, native or graft   . Unspecified sleep apnea   . Type II or unspecified type diabetes mellitus without mention of complication, not stated as uncontrolled   . Shortness of breath    Past Surgical History  Procedure Laterality Date  . Coronary angioplasty with stent placement  09/2009    "1"  . Coronary artery bypass graft  ~ 2000    CABG X3  . Tubal ligation  1997   History  Substance Use Topics  . Smoking status: Current Every Day Smoker -- 0.50 packs/day for 25 years    Types: Cigarettes  . Smokeless tobacco: Never Used  . Alcohol Use: 16.8 oz/week    28 Glasses of wine per week   Family History  Problem Relation Age of Onset  . Diabetes Mother   . Coronary artery disease Mother       Review of Systems  All other systems reviewed and are negative.       Objective:   Physical Exam  GENERAL: Well-developed, well-nourished female in no acute distress.  ABDOMEN: Soft, nontender, nondistended. No organomegaly. PELVIC: Normal external female genitalia. Vagina is pink and rugated.  Normal discharge. Normal appearing cervix. Uterus is normal in size. No adnexal mass or  tenderness. EXTREMITIES: No cyanosis, clubbing, or edema, 2+ distal pulses.     Assessment & Plan:  47 yo with persistent abnormal uterine bleeding despite medical management with Mirena IUD - Discussed surgical management with Endometrial ablation. Risks, benefits and alternatives were explained including but not limited to risks of bleeding infection, uterine perforation and damage to adjacent organs. Patient verbalized understanding and all questions were answered. Patient desires to move forward with this option - Patient understands that an endometrial biopsy is required prior to this procedure and agrees to have it done today. Last EmBx in 2011 was benign. ENDOMETRIAL BIOPSY     The indications for endometrial biopsy were reviewed.   Risks of the biopsy including cramping, bleeding, infection, uterine perforation, inadequate specimen and need for additional procedures  were discussed. The patient states she understands and agrees to undergo procedure today. Consent was signed. Time out was performed. Urine HCG was negative. A sterile speculum was placed in the patient's vagina and the cervix was prepped with Betadine. A single-toothed tenaculum was placed on the anterior lip of the cervix to stabilize it. The uterine cavity was sounded to a depth of 9 cm using the uterine sound. The 3 mm pipelle was introduced into the endometrial cavity without difficulty, 2 passes were made.  A  moderate amount of tissue was  sent to pathology. The instruments were removed from the patient's vagina. Minimal bleeding  from the cervix was noted. The patient tolerated the procedure well.  Routine post-procedure instructions were given to the patient. The patient will follow up in two weeks to review the results and for further management.   - We will also schedule pelvic ultrasound since her last one was in 2011. - Given patient extensive cardiac history, patient will be scheduled for procedure pending cardiology  clearance

## 2012-08-11 ENCOUNTER — Inpatient Hospital Stay (HOSPITAL_COMMUNITY)
Admission: AD | Admit: 2012-08-11 | Discharge: 2012-08-15 | DRG: 440 | Disposition: A | Discharge status: Home / Self Care | Payer: 59 | Attending: Family Medicine | Admitting: Family Medicine

## 2012-08-11 ENCOUNTER — Encounter (HOSPITAL_COMMUNITY): Payer: Self-pay | Admitting: Emergency Medicine

## 2012-08-11 DIAGNOSIS — I2510 Atherosclerotic heart disease of native coronary artery without angina pectoris: Secondary | ICD-10-CM | POA: Diagnosis present

## 2012-08-11 DIAGNOSIS — F17200 Nicotine dependence, unspecified, uncomplicated: Secondary | ICD-10-CM | POA: Diagnosis present

## 2012-08-11 DIAGNOSIS — K219 Gastro-esophageal reflux disease without esophagitis: Secondary | ICD-10-CM

## 2012-08-11 DIAGNOSIS — I10 Essential (primary) hypertension: Secondary | ICD-10-CM | POA: Diagnosis present

## 2012-08-11 DIAGNOSIS — R634 Abnormal weight loss: Secondary | ICD-10-CM

## 2012-08-11 DIAGNOSIS — R1010 Upper abdominal pain, unspecified: Secondary | ICD-10-CM

## 2012-08-11 DIAGNOSIS — K859 Acute pancreatitis without necrosis or infection, unspecified: Principal | ICD-10-CM | POA: Diagnosis present

## 2012-08-11 DIAGNOSIS — J449 Chronic obstructive pulmonary disease, unspecified: Secondary | ICD-10-CM

## 2012-08-11 DIAGNOSIS — E119 Type 2 diabetes mellitus without complications: Secondary | ICD-10-CM | POA: Diagnosis present

## 2012-08-11 DIAGNOSIS — E876 Hypokalemia: Secondary | ICD-10-CM | POA: Diagnosis present

## 2012-08-11 DIAGNOSIS — K861 Other chronic pancreatitis: Secondary | ICD-10-CM | POA: Diagnosis present

## 2012-08-11 DIAGNOSIS — N939 Abnormal uterine and vaginal bleeding, unspecified: Secondary | ICD-10-CM

## 2012-08-11 DIAGNOSIS — E118 Type 2 diabetes mellitus with unspecified complications: Secondary | ICD-10-CM | POA: Diagnosis present

## 2012-08-11 DIAGNOSIS — Z951 Presence of aortocoronary bypass graft: Secondary | ICD-10-CM

## 2012-08-11 DIAGNOSIS — F1010 Alcohol abuse, uncomplicated: Secondary | ICD-10-CM

## 2012-08-11 DIAGNOSIS — R109 Unspecified abdominal pain: Secondary | ICD-10-CM | POA: Diagnosis present

## 2012-08-11 DIAGNOSIS — E669 Obesity, unspecified: Secondary | ICD-10-CM | POA: Diagnosis present

## 2012-08-11 DIAGNOSIS — E860 Dehydration: Secondary | ICD-10-CM | POA: Diagnosis present

## 2012-08-11 DIAGNOSIS — R112 Nausea with vomiting, unspecified: Secondary | ICD-10-CM | POA: Diagnosis present

## 2012-08-11 DIAGNOSIS — I519 Heart disease, unspecified: Secondary | ICD-10-CM

## 2012-08-11 DIAGNOSIS — D869 Sarcoidosis, unspecified: Secondary | ICD-10-CM | POA: Diagnosis present

## 2012-08-11 DIAGNOSIS — E7800 Pure hypercholesterolemia, unspecified: Secondary | ICD-10-CM

## 2012-08-11 DIAGNOSIS — D259 Leiomyoma of uterus, unspecified: Secondary | ICD-10-CM | POA: Diagnosis present

## 2012-08-11 DIAGNOSIS — G4730 Sleep apnea, unspecified: Secondary | ICD-10-CM | POA: Diagnosis present

## 2012-08-11 HISTORY — DX: Essential (primary) hypertension: I10

## 2012-08-11 LAB — CBC WITH DIFFERENTIAL/PLATELET
Basophils Absolute: 0 10*3/uL (ref 0.0–0.1)
Basophils Relative: 0 % (ref 0–1)
Eosinophils Absolute: 0.1 10*3/uL (ref 0.0–0.7)
MCH: 31.2 pg (ref 26.0–34.0)
MCHC: 33.2 g/dL (ref 30.0–36.0)
Monocytes Absolute: 1.3 10*3/uL — ABNORMAL HIGH (ref 0.1–1.0)
Neutro Abs: 6.5 10*3/uL (ref 1.7–7.7)
Neutrophils Relative %: 63 % (ref 43–77)
RDW: 12.8 % (ref 11.5–15.5)

## 2012-08-11 LAB — LIPASE, BLOOD: Lipase: >3000 U/L — ABNORMAL HIGH (ref 11–59)

## 2012-08-11 LAB — COMPREHENSIVE METABOLIC PANEL
AST: 29 U/L (ref 0–37)
Albumin: 3.7 g/dL (ref 3.5–5.2)
Chloride: 99 mEq/L (ref 96–112)
Creatinine, Ser: 0.75 mg/dL (ref 0.50–1.10)
Potassium: 3.3 mEq/L — ABNORMAL LOW (ref 3.5–5.1)
Total Bilirubin: 0.4 mg/dL (ref 0.3–1.2)
Total Protein: 7.4 g/dL (ref 6.0–8.3)

## 2012-08-11 LAB — ETHANOL: Alcohol, Ethyl (B): 244 mg/dL — ABNORMAL HIGH (ref 0–11)

## 2012-08-11 LAB — URINALYSIS, ROUTINE W REFLEX MICROSCOPIC
Ketones, ur: NEGATIVE mg/dL
Leukocytes, UA: NEGATIVE
Nitrite: NEGATIVE
Protein, ur: NEGATIVE mg/dL
Urobilinogen, UA: 0.2 mg/dL (ref 0.0–1.0)

## 2012-08-11 MED ORDER — HYDROMORPHONE HCL PF 1 MG/ML IJ SOLN
1.0000 mg | Freq: Once | INTRAMUSCULAR | Status: AC
  Administered 2012-08-11: 1 mg via INTRAVENOUS
  Filled 2012-08-11: qty 1

## 2012-08-11 MED ORDER — PANTOPRAZOLE SODIUM 40 MG IV SOLR
40.0000 mg | Freq: Once | INTRAVENOUS | Status: AC
  Administered 2012-08-11: 40 mg via INTRAVENOUS
  Filled 2012-08-11: qty 40

## 2012-08-11 MED ORDER — ONDANSETRON HCL 4 MG/2ML IJ SOLN
4.0000 mg | Freq: Once | INTRAMUSCULAR | Status: AC
  Administered 2012-08-11: 4 mg via INTRAVENOUS
  Filled 2012-08-11: qty 2

## 2012-08-11 MED ORDER — SODIUM CHLORIDE 0.9 % IV BOLUS (SEPSIS)
1000.0000 mL | Freq: Once | INTRAVENOUS | Status: AC
  Administered 2012-08-11: 1000 mL via INTRAVENOUS

## 2012-08-11 NOTE — ED Provider Notes (Signed)
History     CSN: [id number]  Arrival date & time 08/11/12  2106   First MD Initiated Contact with Patient 08/11/12 2111      Chief Complaint  Patient presents with  . Abdominal Pain    (Consider location/radiation/quality/duration/timing/severity/associated sxs/prior treatment) HPI Comments: Patient history of pancreatitis presents with upper abdominal pain. She states it started earlier today and is been constant since then. She states worsening today. It is associated with nausea and vomiting. She denies any hematemesis or blood in her stool. She's had some diarrhea as well today. She denies he fevers or chills. Her pancreatitis is thought to be related to chronic alcohol use. She doesn't like to drinking 3 or 4 glasses of wine today.  Patient is a 47 y.o. female presenting with abdominal pain.  Abdominal Pain Associated symptoms: diarrhea, nausea and vomiting   Associated symptoms: no chest pain, no chills, no cough, no fatigue, no fever, no hematuria and no shortness of breath     Past Medical History  Diagnosis Date  . Loss of weight   . Sarcoidosis   . Leiomyoma of uterus, unspecified   . Pure hypercholesterolemia   . Obesity, unspecified   . Chronic pancreatitis   . Coronary atherosclerosis of unspecified type of vessel, native or graft   . Unspecified sleep apnea   . Type II or unspecified type diabetes mellitus without mention of complication, not stated as uncontrolled   . Shortness of breath   . Hypertension     Past Surgical History  Procedure Laterality Date  . Coronary angioplasty with stent placement  09/2009    "1"  . Coronary artery bypass graft  ~ 2000    CABG X3  . Tubal ligation  1997    Family History  Problem Relation Age of Onset  . Diabetes Mother   . Coronary artery disease Mother     History  Substance Use Topics  . Smoking status: Current Every Day Smoker -- 0.50 packs/day for 25 years    Types: Cigarettes  . Smokeless tobacco: Never  Used  . Alcohol Use: 16.8 oz/week    28 Glasses of wine per week    OB History   Grav Para Term Preterm Abortions TAB SAB Ect Mult Living   3 3 3       3       Review of Systems  Constitutional: Negative for fever, chills, diaphoresis and fatigue.  HENT: Negative for congestion, rhinorrhea and sneezing.   Eyes: Negative.   Respiratory: Negative for cough, chest tightness and shortness of breath.   Cardiovascular: Negative for chest pain and leg swelling.  Gastrointestinal: Positive for nausea, vomiting, abdominal pain and diarrhea. Negative for blood in stool.  Genitourinary: Negative for frequency, hematuria, flank pain and difficulty urinating.  Musculoskeletal: Negative for back pain and arthralgias.  Skin: Negative for rash.  Neurological: Negative for dizziness, speech difficulty, weakness, numbness and headaches.    Allergies  Review of patient's allergies indicates no known allergies.  Home Medications   Current Outpatient Rx  Name  Route  Sig  Dispense  Refill  . aspirin 325 MG tablet   Oral   Take 325 mg by mouth daily with lunch.          . clopidogrel (PLAVIX) 75 MG tablet   Oral   Take 75 mg by mouth daily with lunch.          . Fluticasone-Salmeterol (ADVAIR DISKUS) 250-50 MCG/DOSE AEPB   Inhalation  Inhale 1 puff into the lungs every 12 (twelve) hours.           . hydrochlorothiazide 25 MG tablet   Oral   Take 25 mg by mouth every morning.          Marland Kitchen lisinopril (PRINIVIL,ZESTRIL) 10 MG tablet   Oral   Take 10 mg by mouth every morning.         . metFORMIN (GLUCOPHAGE) 500 MG tablet   Oral   Take 500 mg by mouth daily with lunch. Take with food         . metoprolol (LOPRESSOR) 50 MG tablet   Oral   Take 50 mg by mouth 2 (two) times daily.         . Multiple Vitamin (MULTIVITAMIN WITH MINERALS) TABS   Oral   Take 1 tablet by mouth daily with lunch.         . pravastatin (PRAVACHOL) 20 MG tablet   Oral   Take 20 mg by mouth  daily with lunch.            BP 152/134  Pulse 78  Temp(Src) 98.6 F (37 C) (Oral)  Resp 18  SpO2 95%  Physical Exam  Constitutional: She is oriented to person, place, and time. She appears well-developed and well-nourished.  HENT:  Head: Normocephalic and atraumatic.  Mouth/Throat: Oropharynx is clear and moist.  Eyes: Pupils are equal, round, and reactive to light.  Neck: Normal range of motion. Neck supple.  Cardiovascular: Normal rate, regular rhythm and normal heart sounds.   Pulmonary/Chest: Effort normal and breath sounds normal. No respiratory distress. She has no wheezes. She has no rales. She exhibits no tenderness.  Abdominal: Soft. Bowel sounds are normal. There is tenderness (Moderate tenderness around epigastrium). There is no rebound and no guarding.  Musculoskeletal: Normal range of motion. She exhibits no edema.  Lymphadenopathy:    She has no cervical adenopathy.  Neurological: She is alert and oriented to person, place, and time.  Skin: Skin is warm and dry. No rash noted.  Psychiatric: She has a normal mood and affect.    ED Course  Procedures (including critical care time)  Results for orders placed during the hospital encounter of 08/11/12  URINALYSIS, ROUTINE W REFLEX MICROSCOPIC      Result Value Range   Color, Urine YELLOW  YELLOW   APPearance CLEAR  CLEAR   Specific Gravity, Urine 1.010  1.005 - 1.030   pH 5.0  5.0 - 8.0   Glucose, UA NEGATIVE  NEGATIVE mg/dL   Hgb urine dipstick NEGATIVE  NEGATIVE   Bilirubin Urine NEGATIVE  NEGATIVE   Ketones, ur NEGATIVE  NEGATIVE mg/dL   Protein, ur NEGATIVE  NEGATIVE mg/dL   Urobilinogen, UA 0.2  0.0 - 1.0 mg/dL   Nitrite NEGATIVE  NEGATIVE   Leukocytes, UA NEGATIVE  NEGATIVE  CBC WITH DIFFERENTIAL      Result Value Range   WBC 10.4  4.0 - 10.5 K/uL   RBC 5.04  3.87 - 5.11 MIL/uL   Hemoglobin 15.7 (*) 12.0 - 15.0 g/dL   HCT 45.4 (*) 09.8 - 11.9 %   MCV 93.8  78.0 - 100.0 fL   MCH 31.2  26.0 - 34.0  pg   MCHC 33.2  30.0 - 36.0 g/dL   RDW 14.7  82.9 - 56.2 %   Platelets 285  150 - 400 K/uL   Neutrophils Relative % 63  43 - 77 %   Neutro  Abs 6.5  1.7 - 7.7 K/uL   Lymphocytes Relative 24  12 - 46 %   Lymphs Abs 2.5  0.7 - 4.0 K/uL   Monocytes Relative 12  3 - 12 %   Monocytes Absolute 1.3 (*) 0.1 - 1.0 K/uL   Eosinophils Relative 1  0 - 5 %   Eosinophils Absolute 0.1  0.0 - 0.7 K/uL   Basophils Relative 0  0 - 1 %   Basophils Absolute 0.0  0.0 - 0.1 K/uL  COMPREHENSIVE METABOLIC PANEL      Result Value Range   Sodium 135  135 - 145 mEq/L   Potassium 3.3 (*) 3.5 - 5.1 mEq/L   Chloride 99  96 - 112 mEq/L   CO2 21  19 - 32 mEq/L   Glucose, Bld 130 (*) 70 - 99 mg/dL   BUN 15  6 - 23 mg/dL   Creatinine, Ser 4.09  0.50 - 1.10 mg/dL   Calcium 9.5  8.4 - 81.1 mg/dL   Total Protein 7.4  6.0 - 8.3 g/dL   Albumin 3.7  3.5 - 5.2 g/dL   AST 29  0 - 37 U/L   ALT 30  0 - 35 U/L   Alkaline Phosphatase 49  39 - 117 U/L   Total Bilirubin 0.4  0.3 - 1.2 mg/dL   GFR calc non Af Amer >90  >90 mL/min   GFR calc Af Amer >90  >90 mL/min  LIPASE, BLOOD      Result Value Range   Lipase >3000 (*) 11 - 59 U/L  ETHANOL      Result Value Range   Alcohol, Ethyl (B) 244 (*) 0 - 11 mg/dL  POCT PREGNANCY, URINE      Result Value Range   Preg Test, Ur NEGATIVE  NEGATIVE   No results found.    1. Pancreatitis       MDM  Pt with markedly elevated lipase.  No hypotension or signs of sepsis.  Will admit to [facility].        Rolan Bucco, MD 08/11/12 [phone]

## 2012-08-11 NOTE — ED Notes (Signed)
ZOX:[id number]<AV> Expected date:08/11/12<BR> Expected time: 8:42 PM<BR> Means of arrival:Ambulance<BR> Comments:<BR> abd pain

## 2012-08-11 NOTE — ED Notes (Signed)
Admitting MD at bedside.

## 2012-08-11 NOTE — ED Notes (Signed)
Patient with upper abdominal pain which started today.  Patient had wine with dinner with aggravated it.  Vomited x 1 at lunch.  History of pancreatitis, loose stools for several weeks.

## 2012-08-12 ENCOUNTER — Inpatient Hospital Stay (HOSPITAL_COMMUNITY)
Admit: 2012-08-12 | Discharge: 2012-08-12 | Disposition: A | Discharge status: Home / Self Care | Payer: 59 | Attending: Obstetrics and Gynecology | Admitting: Obstetrics and Gynecology

## 2012-08-12 ENCOUNTER — Inpatient Hospital Stay (HOSPITAL_COMMUNITY): Payer: 59

## 2012-08-12 ENCOUNTER — Encounter (HOSPITAL_COMMUNITY): Payer: Self-pay | Admitting: General Practice

## 2012-08-12 DIAGNOSIS — D259 Leiomyoma of uterus, unspecified: Secondary | ICD-10-CM

## 2012-08-12 DIAGNOSIS — D869 Sarcoidosis, unspecified: Secondary | ICD-10-CM

## 2012-08-12 DIAGNOSIS — N939 Abnormal uterine and vaginal bleeding, unspecified: Secondary | ICD-10-CM

## 2012-08-12 LAB — BASIC METABOLIC PANEL
Calcium: 8.3 mg/dL — ABNORMAL LOW (ref 8.4–10.5)
Creatinine, Ser: 0.77 mg/dL (ref 0.50–1.10)
GFR calc non Af Amer: >90 mL/min (ref >90)
Glucose, Bld: 90 mg/dL (ref 70–99)
Sodium: 138 mEq/L (ref 135–145)

## 2012-08-12 LAB — CBC
MCH: 31.6 pg (ref 26.0–34.0)
MCHC: 32.9 g/dL (ref 30.0–36.0)
MCV: 96.2 fL (ref 78.0–100.0)
Platelets: 232 10*3/uL (ref 150–400)

## 2012-08-12 LAB — HEMOGLOBIN A1C: Hgb A1c MFr Bld: 5.9 % — ABNORMAL HIGH (ref <5.7)

## 2012-08-12 LAB — GLUCOSE, CAPILLARY
Glucose-Capillary: 100 mg/dL — ABNORMAL HIGH (ref 70–99)
Glucose-Capillary: 105 mg/dL — ABNORMAL HIGH (ref 70–99)
Glucose-Capillary: 70 mg/dL (ref 70–99)

## 2012-08-12 LAB — TSH: TSH: 2.498 u[IU]/mL (ref 0.350–4.500)

## 2012-08-12 LAB — LIPASE, BLOOD: Lipase: 2003 U/L — ABNORMAL HIGH (ref 11–59)

## 2012-08-12 MED ORDER — THIAMINE HCL 100 MG/ML IJ SOLN
100.0000 mg | Freq: Every day | INTRAMUSCULAR | Status: DC
  Filled 2012-08-12 (×4): qty 1

## 2012-08-12 MED ORDER — PANCRELIPASE (LIP-PROT-AMYL) 12000-38000 UNITS PO CPEP
2.0000 | ORAL_CAPSULE | Freq: Three times a day (TID) | ORAL | Status: DC
  Administered 2012-08-12 – 2012-08-15 (×9): 2 via ORAL
  Filled 2012-08-12 (×11): qty 2

## 2012-08-12 MED ORDER — DIPHENHYDRAMINE HCL 50 MG/ML IJ SOLN
12.5000 mg | Freq: Four times a day (QID) | INTRAMUSCULAR | Status: DC | PRN
  Administered 2012-08-12 – 2012-08-14 (×8): 12.5 mg via INTRAVENOUS
  Filled 2012-08-12 (×8): qty 1

## 2012-08-12 MED ORDER — ENOXAPARIN SODIUM 40 MG/0.4ML ~~LOC~~ SOLN
40.0000 mg | SUBCUTANEOUS | Status: DC
Start: 2012-08-12 — End: 2012-08-15
  Administered 2012-08-12 – 2012-08-15 (×4): 40 mg via SUBCUTANEOUS
  Filled 2012-08-12 (×4): qty 0.4

## 2012-08-12 MED ORDER — ONDANSETRON HCL 4 MG/2ML IJ SOLN
4.0000 mg | Freq: Four times a day (QID) | INTRAMUSCULAR | Status: DC | PRN
  Administered 2012-08-12 (×2): 4 mg via INTRAVENOUS
  Filled 2012-08-12: qty 2

## 2012-08-12 MED ORDER — ADULT MULTIVITAMIN W/MINERALS CH
1.0000 | ORAL_TABLET | Freq: Every day | ORAL | Status: DC
  Administered 2012-08-12 – 2012-08-15 (×4): 1 via ORAL
  Filled 2012-08-12 (×4): qty 1

## 2012-08-12 MED ORDER — ONDANSETRON HCL 4 MG PO TABS: 4.0000 mg | ORAL_TABLET | Freq: Four times a day (QID) | ORAL | Status: DC | PRN

## 2012-08-12 MED ORDER — FOLIC ACID 1 MG PO TABS
1.0000 mg | ORAL_TABLET | Freq: Every day | ORAL | Status: DC
  Administered 2012-08-12 – 2012-08-15 (×4): 1 mg via ORAL
  Filled 2012-08-12 (×4): qty 1

## 2012-08-12 MED ORDER — MOMETASONE FURO-FORMOTEROL FUM 100-5 MCG/ACT IN AERO
2.0000 | INHALATION_SPRAY | Freq: Two times a day (BID) | RESPIRATORY_TRACT | Status: DC
  Administered 2012-08-12 – 2012-08-15 (×7): 2 via RESPIRATORY_TRACT
  Filled 2012-08-12: qty 8.8

## 2012-08-12 MED ORDER — VITAMIN B-1 100 MG PO TABS
100.0000 mg | ORAL_TABLET | Freq: Every day | ORAL | Status: DC
Start: 2012-08-12 — End: 2012-08-15
  Administered 2012-08-12 – 2012-08-15 (×4): 100 mg via ORAL
  Filled 2012-08-12 (×4): qty 1

## 2012-08-12 MED ORDER — METOPROLOL TARTRATE 50 MG PO TABS
50.0000 mg | ORAL_TABLET | Freq: Two times a day (BID) | ORAL | Status: DC
  Administered 2012-08-12 – 2012-08-13 (×2): 50 mg via ORAL
  Filled 2012-08-12 (×4): qty 1

## 2012-08-12 MED ORDER — CLOPIDOGREL BISULFATE 75 MG PO TABS
75.0000 mg | ORAL_TABLET | Freq: Every day | ORAL | Status: DC
  Administered 2012-08-12 – 2012-08-15 (×4): 75 mg via ORAL
  Filled 2012-08-12 (×4): qty 1

## 2012-08-12 MED ORDER — SODIUM CHLORIDE 0.9 % IV SOLN
INTRAVENOUS | Status: DC
  Administered 2012-08-12 – 2012-08-15 (×7): via INTRAVENOUS

## 2012-08-12 MED ORDER — HYDROMORPHONE HCL PF 1 MG/ML IJ SOLN
1.0000 mg | INTRAMUSCULAR | Status: DC | PRN
  Administered 2012-08-12 (×2): 1 mg via INTRAVENOUS
  Filled 2012-08-12 (×3): qty 1

## 2012-08-12 MED ORDER — ASPIRIN 325 MG PO TABS
325.0000 mg | ORAL_TABLET | Freq: Every day | ORAL | Status: DC
  Administered 2012-08-13 – 2012-08-15 (×3): 325 mg via ORAL
  Filled 2012-08-12 (×3): qty 1

## 2012-08-12 MED ORDER — SODIUM CHLORIDE 0.9 % IV SOLN: INTRAVENOUS | Status: DC

## 2012-08-12 MED ORDER — LISINOPRIL 10 MG PO TABS
10.0000 mg | ORAL_TABLET | Freq: Every day | ORAL | Status: DC
  Administered 2012-08-13: 10 mg via ORAL
  Filled 2012-08-12: qty 1

## 2012-08-12 MED ORDER — POTASSIUM CHLORIDE IN NACL 20-0.9 MEQ/L-% IV SOLN
INTRAVENOUS | Status: DC
  Administered 2012-08-12: 03:00:00 via INTRAVENOUS
  Filled 2012-08-12 (×2): qty 1000

## 2012-08-12 MED ORDER — INSULIN ASPART 100 UNIT/ML ~~LOC~~ SOLN: 0.0000 [IU] | Freq: Three times a day (TID) | SUBCUTANEOUS | Status: DC

## 2012-08-12 MED ORDER — HYDROCODONE-ACETAMINOPHEN 5-325 MG PO TABS
1.0000 | ORAL_TABLET | ORAL | Status: DC | PRN
  Administered 2012-08-14 – 2012-08-15 (×2): 2 via ORAL
  Filled 2012-08-12 (×3): qty 2

## 2012-08-12 MED ORDER — HYDROMORPHONE HCL PF 1 MG/ML IJ SOLN
1.0000 mg | INTRAMUSCULAR | Status: DC | PRN
  Administered 2012-08-12 – 2012-08-13 (×6): 2 mg via INTRAVENOUS
  Administered 2012-08-13: 1 mg via INTRAVENOUS
  Administered 2012-08-13 (×4): 2 mg via INTRAVENOUS
  Administered 2012-08-14 (×2): 1 mg via INTRAVENOUS
  Administered 2012-08-14: 2 mg via INTRAVENOUS
  Administered 2012-08-14 (×2): 1 mg via INTRAVENOUS
  Administered 2012-08-14 – 2012-08-15 (×3): 2 mg via INTRAVENOUS
  Filled 2012-08-12: qty 2
  Filled 2012-08-12: qty 1
  Filled 2012-08-12 (×2): qty 2
  Filled 2012-08-12 (×3): qty 1
  Filled 2012-08-12 (×12): qty 2

## 2012-08-12 MED ORDER — MORPHINE SULFATE 2 MG/ML IJ SOLN: 1.0000 mg | INTRAMUSCULAR | Status: DC | PRN

## 2012-08-12 MED ORDER — LORAZEPAM 1 MG PO TABS
1.0000 mg | ORAL_TABLET | Freq: Four times a day (QID) | ORAL | Status: AC | PRN
  Administered 2012-08-12 – 2012-08-14 (×3): 1 mg via ORAL
  Filled 2012-08-12 (×3): qty 1

## 2012-08-12 MED ORDER — INSULIN ASPART 100 UNIT/ML ~~LOC~~ SOLN
0.0000 [IU] | SUBCUTANEOUS | Status: DC
  Administered 2012-08-12 (×2): 2 [IU] via SUBCUTANEOUS
  Administered 2012-08-14: 3 [IU] via SUBCUTANEOUS
  Administered 2012-08-15: 2 [IU] via SUBCUTANEOUS

## 2012-08-12 MED ORDER — ONDANSETRON HCL 4 MG/2ML IJ SOLN
4.0000 mg | Freq: Three times a day (TID) | INTRAMUSCULAR | Status: AC | PRN
  Filled 2012-08-12: qty 2

## 2012-08-12 MED ORDER — LORAZEPAM 2 MG/ML IJ SOLN
1.0000 mg | Freq: Four times a day (QID) | INTRAMUSCULAR | Status: AC | PRN
  Administered 2012-08-13: 1 mg via INTRAVENOUS
  Filled 2012-08-12: qty 1

## 2012-08-12 NOTE — H&P (Signed)
[facility] History and Physical  [name] JYN:[id number] DOB: April 27, 1965 DOA: 08/11/2012  Referring physician: er PCP: Lehman Prom, NP  Specialists:   Chief Complaint: nausea/abd pain  HPI: Marie [name] is a 47 y.o. 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  She continues to drink alcohol despite the risks.  She comes in tonight with c/o upper abd pain.  Started earlier today with N/V.  And has continually gotten worse.  +diarrhea.  No fever, no chills.  Had wine at dinner tonight and vomited  Last hospitalization was 10/13 for a similar episode   Review of Systems: all systems reviewed, negative unless stated above    Past Medical History  Diagnosis Date  . Loss of weight   . Sarcoidosis   . Leiomyoma of uterus, unspecified   . Pure hypercholesterolemia   . Obesity, unspecified   . Chronic pancreatitis   . Coronary atherosclerosis of unspecified type of vessel, native or graft   . Unspecified sleep apnea   . Type II or unspecified type diabetes mellitus without mention of complication, not stated as uncontrolled   . Shortness of breath   . Hypertension    Past Surgical History  Procedure Laterality Date  . Coronary angioplasty with stent placement  09/2009    "1"  . Coronary artery bypass graft  ~ 2000    CABG X3  . Tubal ligation  1997   Social History:  reports that she has been smoking Cigarettes.  She has a 12.5 pack-year smoking history. She has never used smokeless tobacco. She reports that she drinks about 16.8 ounces of alcohol per week. She reports that she does not use illicit drugs.  No Known Allergies  Family History  Problem Relation Age of Onset  . Diabetes Mother   . Coronary artery disease Mother      Prior to Admission medications   Medication Sig Start Date End Date Taking? Authorizing Provider  aspirin 325 MG tablet Take 325 mg by mouth daily with lunch.    Yes Historical Provider, MD  clopidogrel (PLAVIX) 75 MG  tablet Take 75 mg by mouth daily with lunch.    Yes Historical Provider, MD  Fluticasone-Salmeterol (ADVAIR DISKUS) 250-50 MCG/DOSE AEPB Inhale 1 puff into the lungs every 12 (twelve) hours.     Yes Historical Provider, MD  hydrochlorothiazide 25 MG tablet Take 25 mg by mouth every morning.    Yes Historical Provider, MD  lisinopril (PRINIVIL,ZESTRIL) 10 MG tablet Take 10 mg by mouth every morning.   Yes Historical Provider, MD  metFORMIN (GLUCOPHAGE) 500 MG tablet Take 500 mg by mouth daily with lunch. Take with food   Yes Historical Provider, MD  metoprolol (LOPRESSOR) 50 MG tablet Take 50 mg by mouth 2 (two) times daily.   Yes Historical Provider, MD  Multiple Vitamin (MULTIVITAMIN WITH MINERALS) TABS Take 1 tablet by mouth daily with lunch. 01/11/12  Yes Lesle Chris Black, NP  pravastatin (PRAVACHOL) 20 MG tablet Take 20 mg by mouth daily with lunch.    Yes Historical Provider, MD   Physical Exam: Filed Vitals:   08/11/12 2104 08/11/12 2345 08/11/12 2350  BP: 152/134  80/50  Pulse: 78 70 69  Temp: 98.6 F (37 C)    TempSrc: Oral    Resp: 18 18   SpO2: 95% 97%      General:  Glassy eyes and slurred speech- appears intoxicated  Eyes: glassy  ENT: wnl  Neck: supple  Cardiovascular: rrr  Respiratory: clear  anterior  Abdomen: +BS, soft, tender in epigastric region  Skin: no rashes or lesions  Musculoskeletal: moves all 4 ext  Psychiatric: normal mood/affect  Neurologic: CN 2-12 intact  Labs on Admission:  Basic Metabolic Panel:  Recent Labs Lab 08/11/12 2130  NA 135  K 3.3*  CL 99  CO2 21  GLUCOSE 130*  BUN 15  CREATININE 0.75  CALCIUM 9.5   Liver Function Tests:  Recent Labs Lab 08/11/12 2130  AST 29  ALT 30  ALKPHOS 49  BILITOT 0.4  PROT 7.4  ALBUMIN 3.7    Recent Labs Lab 08/11/12 2130  LIPASE >3000*   No results found for this basename: AMMONIA,  in the last 168 hours CBC:  Recent Labs Lab 08/11/12 2130  WBC 10.4  NEUTROABS 6.5  HGB  15.7*  HCT 47.3*  MCV 93.8  PLT 285   Cardiac Enzymes: No results found for this basename: CKTOTAL, CKMB, CKMBINDEX, TROPONINI,  in the last 168 hours  BNP (last 3 results) No results found for this basename: PROBNP,  in the last 8760 hours CBG: No results found for this basename: GLUCAP,  in the last 168 hours  Radiological Exams on Admission: No results found.    Assessment/Plan Principal Problem:   Acute pancreatitis Active Problems:   DIABETES MELLITUS, TYPE II   Hypokalemia   Alcohol abuse, continuous   Abdominal pain   Acute on chronic alcoholic pancreatitis. She will be admitted for IV pain meds, IVF, and antiemetics. She is at risk for major alcohol withdrawal, and will be placed on CIWA protocol. She has not  had DT or prior alcohol withdrawal.  will give SSI q 4 hours -check lipase in AM     Code Status: full Family Communication: husband at bedside Disposition Plan: home 2-3 days  Time spent: 70 min  Benjamine Mola Sabriyah Wilcher [facility] Pager [phone]  If 7PM-7AM, please contact night-coverage [email or web address] Password [email or web address] 08/12/2012, 12:11 AM

## 2012-08-12 NOTE — Progress Notes (Addendum)
[facility] PROGRESS NOTE  Marie [name] WUJ:[id number] DOB: 07-11-1965 DOA: 08/11/2012 PCP: Lehman Prom, NP    Brief HPI:  Marie [name] is a 47 y.o. 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 She continues to drink alcohol despite the risks. She comes in tonight with c/o upper abd pain associated with nausea and vomiting. She was found to have lipase level of 3000 on admission. She was admitted for acute pancreatitis.   Assessment/Plan:   1. Acute on chronic Pancreatitis: admitted to med surg, IV pain medications, anti emetics, and IV fluids. Repeat lipase levels improving. Will order am triglyceride levels. She is currently NPO, will start her on clear liquid diet. US abdomen confirmed calcific pancreas, with minimally dilated pancreatic duct. Liver function test are normal. Will start her on creon.  2. Hypokalemia: will be repleted as needed.   3. Alcohol abuse; CIWA protocol on board. CSW consult will be ordered. Thiamine and folic acid ordered.  4. Dehydration: on IV fluids.   5. Sarcoidosis;on dulera.  6. Diabetes mellitus: hgba1c ordered and pending. Oral agents on hold and will start her on SSI.   7. CADs/p CABG: currently chest pain free, on aspirin and plavix,b blockers and lisinopril and pravachol . Resume all  Home  Medications.  5. DVT prophylaxis.   Code Status: full code Family Communication: none at bedside Disposition Plan: pending   Consultants:  none  Procedures:  none  Antibiotics:  none  HPI/Subjective: Reports thepain medication works only for 2 hours. No vomiting, slightly nauseated.   Objective: Filed Vitals:   08/12/12 0129 08/12/12 0216 08/12/12 0625 08/12/12 0744  BP: 85/61 87/57 101/67   Pulse: 66 65 68   Temp:  97.4 F (36.3 C) 98.1 F (36.7 C)   TempSrc:  Oral Oral   Resp: 18 18 17    Height: 5\' 2"  (1.575 m)     Weight: 68.04 kg (150 lb)     SpO2: 96% 92% 93% 96%    Intake/Output Summary (Last 24 hours) at  08/12/12 1054 Last data filed at 08/12/12 1000  Gross per 24 hour  Intake 653.33 ml  Output    600 ml  Net  53.33 ml   Filed Weights   08/12/12 0129  Weight: 68.04 kg (150 lb)    Exam:   General:  Alert afebrile comfortable  Cardiovascular: s1s2 RRR   Respiratory: CTAB no wheezing or rhonchi  Abdomen: soft mild epigastric tenderness ND  BS+  Musculoskeletal: no pedal edema.   Data Reviewed: Basic Metabolic Panel:  Recent Labs Lab 08/11/12 2130  NA 135  K 3.3*  CL 99  CO2 21  GLUCOSE 130*  BUN 15  CREATININE 0.75  CALCIUM 9.5   Liver Function Tests:  Recent Labs Lab 08/11/12 2130  AST 29  ALT 30  ALKPHOS 49  BILITOT 0.4  PROT 7.4  ALBUMIN 3.7    Recent Labs Lab 08/11/12 2130 08/12/12 0445  LIPASE >3000* 2003*   No results found for this basename: AMMONIA,  in the last 168 hours CBC:  Recent Labs Lab 08/11/12 2130  WBC 10.4  NEUTROABS 6.5  HGB 15.7*  HCT 47.3*  MCV 93.8  PLT 285   Cardiac Enzymes: No results found for this basename: CKTOTAL, CKMB, CKMBINDEX, TROPONINI,  in the last 168 hours BNP (last 3 results) No results found for this basename: PROBNP,  in the last 8760 hours CBG:  Recent Labs Lab 08/12/12 0346 08/12/12 0731  GLUCAP 100* 102*  No results found for this or any previous visit (from the past 240 hour(s)).   Studies: No results found.  Scheduled Meds: . sodium chloride   Intravenous STAT  . clopidogrel  75 mg Oral Q lunch  . enoxaparin (LOVENOX) injection  40 mg Subcutaneous Q24H  . folic acid  1 mg Oral Daily  . insulin aspart  0-15 Units Subcutaneous Q4H  . mometasone-formoterol  2 puff Inhalation BID  . multivitamin with minerals  1 tablet Oral Daily  . thiamine  100 mg Oral Daily   Or  . thiamine  100 mg Intravenous Daily   Continuous Infusions:   Principal Problem:   Acute pancreatitis Active Problems:   DIABETES MELLITUS, TYPE II   Hypokalemia   Alcohol abuse, continuous   Abdominal  pain    Time spent: 35 minutes    Marie [name]  [facility] Pager [phone]. If 7PM-7AM, please contact night-coverage at [email or web address], password [facility] 08/12/2012, 10:54 AM  LOS: 1 day

## 2012-08-13 DIAGNOSIS — I519 Heart disease, unspecified: Secondary | ICD-10-CM

## 2012-08-13 DIAGNOSIS — E119 Type 2 diabetes mellitus without complications: Secondary | ICD-10-CM

## 2012-08-13 LAB — GLUCOSE, CAPILLARY
Glucose-Capillary: 67 mg/dL — ABNORMAL LOW (ref 70–99)
Glucose-Capillary: 85 mg/dL (ref 70–99)

## 2012-08-13 LAB — LIPASE, BLOOD: Lipase: 218 U/L — ABNORMAL HIGH (ref 11–59)

## 2012-08-13 NOTE — Progress Notes (Signed)
[facility] PROGRESS NOTE  Marie [name] ZOX:[id number] DOB: 01/10/1966 DOA: 08/11/2012 PCP: Lehman Prom, NP    Brief HPI:  Marie [name] is a 47 y.o. 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 She continues to drink alcohol despite the risks. She comes in tonight with c/o upper abd pain associated with nausea and vomiting. She was found to have lipase level of 3000 on admission. She was admitted for acute pancreatitis.   Assessment/Plan:   1. Acute on chronic Pancreatitis: admitted to med surg, IV pain medications, anti emetics, and IV fluids. Repeat lipase levels is 218. She is currently NPO, will start her on clear liquid diet. US abdomen confirmed calcific pancreas, with minimally dilated pancreatic duct. Liver function test are normal. Will start her on creon.  2. Hypokalemia: repleted.  3. Alcohol abuse; CIWA protocol on board. CSW consult will be ordered. Thiamine and folic acid ordered.  4. Dehydration: on IV fluids.   5. Sarcoidosis;on dulera.  6. Diabetes mellitus: hbaic is 5.9, continue with SSI  7. CADs/p CABG: currently chest pain free, on aspirin and plavix,b blockers and lisinopril and pravachol . Resume all  Home  Medications.  5. DVT prophylaxis.   Code Status: full code Family Communication: none at bedside Disposition Plan: pending   Consultants:  none  Procedures:  none  Antibiotics:  none  HPI/Subjective: Feels better, no pain , no nausea, vomiting.   Objective: Filed Vitals:   08/12/12 2221 08/13/12 0500 08/13/12 0920 08/13/12 1404  BP: 120/84 111/62  87/59  Pulse: 95 88  81  Temp: 99 F (37.2 C) 99.2 F (37.3 C)  98.8 F (37.1 C)  TempSrc: Oral Oral  Oral  Resp: 18 18  20   Height:      Weight:      SpO2: 94% 96% 95% 97%    Intake/Output Summary (Last 24 hours) at 08/13/12 1427 Last data filed at 08/13/12 1405  Gross per 24 hour  Intake 3096.67 ml  Output   1200 ml  Net 1896.67 ml   Filed Weights   08/12/12  0129  Weight: 68.04 kg (150 lb)    Exam:   General:  Alert afebrile comfortable  Cardiovascular: s1s2 RRR   Respiratory: CTAB no wheezing or rhonchi  Abdomen: soft, nontender, no organimegaly  Musculoskeletal: no edema in lower extremities  Data Reviewed: Basic Metabolic Panel:  Recent Labs Lab 08/11/12 2130 08/12/12 1125  NA 135 138  K 3.3* 3.8  CL 99 105  CO2 21 24  GLUCOSE 130* 90  BUN 15 12  CREATININE 0.75 0.77  CALCIUM 9.5 8.3*   Liver Function Tests:  Recent Labs Lab 08/11/12 2130  AST 29  ALT 30  ALKPHOS 49  BILITOT 0.4  PROT 7.4  ALBUMIN 3.7    Recent Labs Lab 08/11/12 2130 08/12/12 0445 08/13/12 0910  LIPASE >3000* 2003* 218*   No results found for this basename: AMMONIA,  in the last 168 hours CBC:  Recent Labs Lab 08/11/12 2130 08/12/12 1125  WBC 10.4 9.9  NEUTROABS 6.5  --   HGB 15.7* 14.8  HCT 47.3* 45.0  MCV 93.8 96.2  PLT 285 232   Cardiac Enzymes: No results found for this basename: CKTOTAL, CKMB, CKMBINDEX, TROPONINI,  in the last 168 hours BNP (last 3 results) No results found for this basename: PROBNP,  in the last 8760 hours CBG:  Recent Labs Lab 08/12/12 2000 08/13/12 08/13/12 0405 08/13/12 0717 08/13/12 1142  GLUCAP 70 72 84 95 85  No results found for this or any previous visit (from the past 240 hour(s)).   Studies: US Abdomen Complete  08/12/2012   *RADIOLOGY REPORT*  Clinical Data:  Pancreatitis, history diabetes, sarcoidosis, hypertension  ULTRASOUND ABDOMEN:  Technique:  Sonography of upper abdominal structures was performed.  Comparison:  03/22/2011  Gallbladder:  Normally distended without stones or wall thickening. No pericholecystic fluid or sonographic Murphy sign.  Common bile duct:  7 mm greatest diameter, minimally prominent for age.  No intrahepatic biliary dilatation.  Liver:  Normal appearance  IVC:  Normal appearance  Pancreas:  Heterogeneous echogenicity.  Shadowing echogenic foci at  pancreatic head/uncinate corresponding to pancreatic calcifications on prior CT. Pancreatic ductal dilatation up to 7 mm diameter.  No definite focal mass.  Spleen:  Normal appearance, 8.2 cm length.  Right kidney:  10.3 cm length. Normal morphology without mass or hydronephrosis.  Left kidney:  10.1 cm length. Normal morphology without mass or hydronephrosis.  Aorta:  Distally obscured by bowel gas, proximal and midportions normal in caliber.  Other:  No free fluid  IMPRESSION: Calcific pancreatitis with pancreatic ductal dilatation of 7 mm diameter. Minimally prominent CBD 7 mm diameter without intrahepatic dilatation; recommend correlation with LFTs. Remainder of exam unremarkable.   Original Report Authenticated By: Ulyses Southward, M.D.   US Transvaginal Non-ob  08/12/2012   *RADIOLOGY REPORT*  Clinical Data: Abnormal uterine bleeding.  TRANSABDOMINAL AND TRANSVAGINAL ULTRASOUND OF PELVIS Technique:  Both transabdominal and transvaginal ultrasound examinations of the pelvis were performed. Transabdominal technique was performed for global imaging of the pelvis including uterus, ovaries, adnexal regions, and pelvic cul-de-sac.  It was necessary to proceed with endovaginal exam following the transabdominal exam to visualize the endometrium, and adnexa, and ovaries as well as uterine body detail.  Comparison:  Ultrasound dated 09/12/2009  Findings:  Uterus: 8.8 x 6.0 x 6.2 cm, essentially unchanged.  3.8 x 3.2 x 3.6 cm anterior uterine fibroid.  1.8 x 1.5 x 2.0 cm posterior uterine fibroid.  3.1 x 3.1 x 3.4 cm lower uterine segment fibroid to the left.  These have all increased in size since the prior exam.  Endometrium: 5 mm.  Right ovary:  3.2 x 1.9 x 1.8 cm.  1.1 cm cyst on the right ovary.  Left ovary: Not visualized.  Other findings: No free fluid  IMPRESSION: Increased size of the multiple uterine fibroids.  No thickening of the endometrium.  Follicular cyst on the right ovary.  Nonvisualization of the left  ovary.   Original Report Authenticated By: Francene Boyers, M.D.   US Pelvis Complete  08/12/2012   *RADIOLOGY REPORT*  Clinical Data: Abnormal uterine bleeding.  TRANSABDOMINAL AND TRANSVAGINAL ULTRASOUND OF PELVIS Technique:  Both transabdominal and transvaginal ultrasound examinations of the pelvis were performed. Transabdominal technique was performed for global imaging of the pelvis including uterus, ovaries, adnexal regions, and pelvic cul-de-sac.  It was necessary to proceed with endovaginal exam following the transabdominal exam to visualize the endometrium, and adnexa, and ovaries as well as uterine body detail.  Comparison:  Ultrasound dated 09/12/2009  Findings:  Uterus: 8.8 x 6.0 x 6.2 cm, essentially unchanged.  3.8 x 3.2 x 3.6 cm anterior uterine fibroid.  1.8 x 1.5 x 2.0 cm posterior uterine fibroid.  3.1 x 3.1 x 3.4 cm lower uterine segment fibroid to the left.  These have all increased in size since the prior exam.  Endometrium: 5 mm.  Right ovary:  3.2 x 1.9 x 1.8 cm.  1.1 cm  cyst on the right ovary.  Left ovary: Not visualized.  Other findings: No free fluid  IMPRESSION: Increased size of the multiple uterine fibroids.  No thickening of the endometrium.  Follicular cyst on the right ovary.  Nonvisualization of the left ovary.   Original Report Authenticated By: Francene Boyers, M.D.    Scheduled Meds: . aspirin  325 mg Oral Daily  . clopidogrel  75 mg Oral Q lunch  . enoxaparin (LOVENOX) injection  40 mg Subcutaneous Q24H  . folic acid  1 mg Oral Daily  . insulin aspart  0-15 Units Subcutaneous Q4H  . lipase/protease/amylase  2 capsule Oral TID WC  . mometasone-formoterol  2 puff Inhalation BID  . multivitamin with minerals  1 tablet Oral Daily  . thiamine  100 mg Oral Daily   Or  . thiamine  100 mg Intravenous Daily   Continuous Infusions: . sodium chloride 125 mL/hr at 08/13/12 4098    Principal Problem:   Acute pancreatitis Active Problems:   DIABETES MELLITUS, TYPE II    Hypokalemia   Alcohol abuse, continuous   Abdominal pain    Time spent: 35 minutes    [facility]  [facility] Pager [phone]. If 7PM-7AM, please contact night-coverage at [email or web address], password [facility] 08/13/2012, 2:27 PM  LOS: 2 days

## 2012-08-14 ENCOUNTER — Other Ambulatory Visit (HOSPITAL_COMMUNITY): Payer: 59

## 2012-08-14 ENCOUNTER — Ambulatory Visit (HOSPITAL_COMMUNITY): Payer: 59

## 2012-08-14 DIAGNOSIS — K859 Acute pancreatitis without necrosis or infection, unspecified: Secondary | ICD-10-CM

## 2012-08-14 DIAGNOSIS — F1010 Alcohol abuse, uncomplicated: Secondary | ICD-10-CM

## 2012-08-14 DIAGNOSIS — E876 Hypokalemia: Secondary | ICD-10-CM

## 2012-08-14 LAB — GLUCOSE, CAPILLARY
Glucose-Capillary: 113 mg/dL — ABNORMAL HIGH (ref 70–99)
Glucose-Capillary: 117 mg/dL — ABNORMAL HIGH (ref 70–99)
Glucose-Capillary: 156 mg/dL — ABNORMAL HIGH (ref 70–99)
Glucose-Capillary: 61 mg/dL — ABNORMAL LOW (ref 70–99)
Glucose-Capillary: 66 mg/dL — ABNORMAL LOW (ref 70–99)
Glucose-Capillary: 72 mg/dL (ref 70–99)
Glucose-Capillary: 86 mg/dL (ref 70–99)

## 2012-08-14 NOTE — Progress Notes (Signed)
Hypoglycemic Event  CBG: 61     Treatment: 15 GM carbohydrate snack  Symptoms: Nervous/irritable  Follow-up CBG: Time:1739 and 1759 CBG Result:64 and 84   Possible Reasons for Event: Inadequate meal intake  Comments/MD notified: Text paged MD, no new orders at this time    Jodi Geralds D  Remember to initiate Hypoglycemia Order Set & complete

## 2012-08-14 NOTE — Progress Notes (Signed)
Treatment apple juice with graham crackers and peanut butter  Symptoms: None  Follow-up CBG: Time:0018 CBG 113  Possible Reasons for Event: Inadequate meal intake  Comments/MD notified K. Devonne Doughty  Remember to initiate Hypoglycemia Order Set & complete

## 2012-08-14 NOTE — Progress Notes (Signed)
[facility] PROGRESS NOTE  Marie [name] RUE:[id number] DOB: [birth year] DOA: 08/11/2012 PCP: Lehman Prom, NP    Brief HPI:  Marie [name] is a 47 y.o. 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 She continues to drink alcohol despite the risks. She comes in tonight with c/o upper abd pain associated with nausea and vomiting. She was found to have lipase level of 3000 on admission. She was admitted for acute pancreatitis.   Assessment/Plan:   1. Acute on chronic Pancreatitis: admitted to med surg, IV pain medications, anti emetics, and IV fluids. Repeat lipase levels is 218. She is currently NPO, will start her on clear liquid diet. US abdomen confirmed calcific pancreas, with minimally dilated pancreatic duct. Liver function test are normal. Will start her on creon. Lipase is down to 80, will start her on soft diet  2. Hypokalemia: repleted.  3. Alcohol abuse; CIWA protocol on board. CSW consult will be ordered. Thiamine and folic acid ordered.  4. Dehydration: on IV fluids.   5. Sarcoidosis;on dulera.  6. Diabetes mellitus: hbaic is 5.9, continue with SSI  7. CADs/p CABG: currently chest pain free, on aspirin and plavix,b blockers and lisinopril and pravachol . Resume all  Home  Medications.  5. DVT prophylaxis. SCD   Code Status: full code Family Communication: none at bedside Disposition Plan: pending   Consultants:  none  Procedures:  none  Antibiotics:  none  HPI/Subjective: Feels better, no pain , no nausea, vomiting. Wants to eat food.  Objective: Filed Vitals:   08/13/12 1500 08/13/12 2103 08/14/12 0616 08/14/12 0817  BP: 93/68 122/69 124/80   Pulse: 87 87 83   Temp:  99 F (37.2 C) 98.1 F (36.7 C)   TempSrc:  Oral Oral   Resp:  16 18   Height:      Weight:      SpO2:  83% 97% 90%    Intake/Output Summary (Last 24 hours) at 08/14/12 1344 Last data filed at 08/14/12 1238  Gross per 24 hour  Intake 4116.66 ml  Output   2050 ml   Net 2066.66 ml   Filed Weights   08/12/12 0129  Weight: 68.04 kg (150 lb)    Exam:   General:  Alert afebrile comfortable  Cardiovascular: s1s2 RRR   Respiratory: CTAB no wheezing or rhonchi  Abdomen: soft, nontender, no organimegaly  Musculoskeletal: no edema in lower extremities  Data Reviewed: Basic Metabolic Panel:  Recent Labs Lab 08/11/12 2130 08/12/12 1125  NA 135 138  K 3.3* 3.8  CL 99 105  CO2 21 24  GLUCOSE 130* 90  BUN 15 12  CREATININE 0.75 0.77  CALCIUM 9.5 8.3*   Liver Function Tests:  Recent Labs Lab 08/11/12 2130  AST 29  ALT 30  ALKPHOS 49  BILITOT 0.4  PROT 7.4  ALBUMIN 3.7    Recent Labs Lab 08/11/12 2130 08/12/12 0445 08/13/12 0910 08/14/12 1030  LIPASE >3000* 2003* 218* 80*   No results found for this basename: AMMONIA,  in the last 168 hours CBC:  Recent Labs Lab 08/11/12 2130 08/12/12 1125  WBC 10.4 9.9  NEUTROABS 6.5  --   HGB 15.7* 14.8  HCT 47.3* 45.0  MCV 93.8 96.2  PLT 285 232   Cardiac Enzymes: No results found for this basename: CKTOTAL, CKMB, CKMBINDEX, TROPONINI,  in the last 168 hours BNP (last 3 results) No results found for this basename: PROBNP,  in the last 8760 hours CBG:  Recent Labs Lab 08/14/12 0018  08/14/12 0045 08/14/12 0328 08/14/12 0710 08/14/12 1218  GLUCAP 66* 113* 72 86 90    No results found for this or any previous visit (from the past 240 hour(s)).   Studies: US Abdomen Complete  08/12/2012   *RADIOLOGY REPORT*  Clinical Data:  Pancreatitis, history diabetes, sarcoidosis, hypertension  ULTRASOUND ABDOMEN:  Technique:  Sonography of upper abdominal structures was performed.  Comparison:  03/22/2011  Gallbladder:  Normally distended without stones or wall thickening. No pericholecystic fluid or sonographic Murphy sign.  Common bile duct:  7 mm greatest diameter, minimally prominent for age.  No intrahepatic biliary dilatation.  Liver:  Normal appearance  IVC:  Normal  appearance  Pancreas:  Heterogeneous echogenicity.  Shadowing echogenic foci at pancreatic head/uncinate corresponding to pancreatic calcifications on prior CT. Pancreatic ductal dilatation up to 7 mm diameter.  No definite focal mass.  Spleen:  Normal appearance, 8.2 cm length.  Right kidney:  10.3 cm length. Normal morphology without mass or hydronephrosis.  Left kidney:  10.1 cm length. Normal morphology without mass or hydronephrosis.  Aorta:  Distally obscured by bowel gas, proximal and midportions normal in caliber.  Other:  No free fluid  IMPRESSION: Calcific pancreatitis with pancreatic ductal dilatation of 7 mm diameter. Minimally prominent CBD 7 mm diameter without intrahepatic dilatation; recommend correlation with LFTs. Remainder of exam unremarkable.   Original Report Authenticated By: Ulyses Southward, M.D.   US Transvaginal Non-ob  08/12/2012   *RADIOLOGY REPORT*  Clinical Data: Abnormal uterine bleeding.  TRANSABDOMINAL AND TRANSVAGINAL ULTRASOUND OF PELVIS Technique:  Both transabdominal and transvaginal ultrasound examinations of the pelvis were performed. Transabdominal technique was performed for global imaging of the pelvis including uterus, ovaries, adnexal regions, and pelvic cul-de-sac.  It was necessary to proceed with endovaginal exam following the transabdominal exam to visualize the endometrium, and adnexa, and ovaries as well as uterine body detail.  Comparison:  Ultrasound dated 09/12/2009  Findings:  Uterus: 8.8 x 6.0 x 6.2 cm, essentially unchanged.  3.8 x 3.2 x 3.6 cm anterior uterine fibroid.  1.8 x 1.5 x 2.0 cm posterior uterine fibroid.  3.1 x 3.1 x 3.4 cm lower uterine segment fibroid to the left.  These have all increased in size since the prior exam.  Endometrium: 5 mm.  Right ovary:  3.2 x 1.9 x 1.8 cm.  1.1 cm cyst on the right ovary.  Left ovary: Not visualized.  Other findings: No free fluid  IMPRESSION: Increased size of the multiple uterine fibroids.  No thickening of the  endometrium.  Follicular cyst on the right ovary.  Nonvisualization of the left ovary.   Original Report Authenticated By: Francene Boyers, M.D.   US Pelvis Complete  08/12/2012   *RADIOLOGY REPORT*  Clinical Data: Abnormal uterine bleeding.  TRANSABDOMINAL AND TRANSVAGINAL ULTRASOUND OF PELVIS Technique:  Both transabdominal and transvaginal ultrasound examinations of the pelvis were performed. Transabdominal technique was performed for global imaging of the pelvis including uterus, ovaries, adnexal regions, and pelvic cul-de-sac.  It was necessary to proceed with endovaginal exam following the transabdominal exam to visualize the endometrium, and adnexa, and ovaries as well as uterine body detail.  Comparison:  Ultrasound dated 09/12/2009  Findings:  Uterus: 8.8 x 6.0 x 6.2 cm, essentially unchanged.  3.8 x 3.2 x 3.6 cm anterior uterine fibroid.  1.8 x 1.5 x 2.0 cm posterior uterine fibroid.  3.1 x 3.1 x 3.4 cm lower uterine segment fibroid to the left.  These have all increased in size since the prior  exam.  Endometrium: 5 mm.  Right ovary:  3.2 x 1.9 x 1.8 cm.  1.1 cm cyst on the right ovary.  Left ovary: Not visualized.  Other findings: No free fluid  IMPRESSION: Increased size of the multiple uterine fibroids.  No thickening of the endometrium.  Follicular cyst on the right ovary.  Nonvisualization of the left ovary.   Original Report Authenticated By: Francene Boyers, M.D.    Scheduled Meds: . aspirin  325 mg Oral Daily  . clopidogrel  75 mg Oral Q lunch  . enoxaparin (LOVENOX) injection  40 mg Subcutaneous Q24H  . folic acid  1 mg Oral Daily  . insulin aspart  0-15 Units Subcutaneous Q4H  . lipase/protease/amylase  2 capsule Oral TID WC  . mometasone-formoterol  2 puff Inhalation BID  . multivitamin with minerals  1 tablet Oral Daily  . thiamine  100 mg Oral Daily   Or  . thiamine  100 mg Intravenous Daily   Continuous Infusions: . sodium chloride 125 mL/hr at 08/14/12 0609    Principal  Problem:   Acute pancreatitis Active Problems:   DIABETES MELLITUS, TYPE II   Hypokalemia   Alcohol abuse, continuous   Abdominal pain    Time spent: 35 minutes    [facility]  [facility] Pager [phone]. If 7PM-7AM, please contact night-coverage at [email or web address], password [facility] 08/14/2012, 1:44 PM  LOS: 3 days

## 2012-08-15 DIAGNOSIS — N939 Abnormal uterine and vaginal bleeding, unspecified: Secondary | ICD-10-CM

## 2012-08-15 LAB — GLUCOSE, CAPILLARY: Glucose-Capillary: 80 mg/dL (ref 70–99)

## 2012-08-15 MED ORDER — PANCRELIPASE (LIP-PROT-AMYL) 12000-38000 UNITS PO CPEP: 2.0000 | ORAL_CAPSULE | Freq: Three times a day (TID) | ORAL | Status: DC

## 2012-08-15 MED ORDER — HYDROCHLOROTHIAZIDE 25 MG PO TABS: 12.5000 mg | ORAL_TABLET | Freq: Every morning | ORAL | Status: DC

## 2012-08-15 MED ORDER — HYDROCODONE-ACETAMINOPHEN 5-325 MG PO TABS: 1.0000 | ORAL_TABLET | ORAL | Status: DC | PRN

## 2012-08-15 NOTE — Discharge Summary (Addendum)
Physician Discharge Summary  Marie Marie [name] OZH:[id number] DOB: 1966-01-02 DOA: 08/11/2012  PCP: Lehman Prom, NP  Admit date: 08/11/2012 Discharge date: 08/15/2012  Time spent: 50* minutes  Recommendations for Outpatient Follow-up:  1. Follow up PCP in one week 2. Follow up cardiology, patient already has an appointment  Discharge Diagnoses:  Principal Problem:   Acute pancreatitis Active Problems:   DIABETES MELLITUS, TYPE II   Hypokalemia   Alcohol abuse, continuous   Abdominal pain   Discharge Condition: Stable  Diet recommendation: low salt diet  Filed Weights   08/12/12 0129  Weight: 68.04 kg (150 lb)    History of present illness:  47 y.o. 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 She continues to drink alcohol despite the risks. She comes in tonight with c/o upper abd pain. Started earlier today with N/V. And has continually gotten worse. +diarrhea. No fever, no chills. Had wine at dinner tonight and vomited  Last hospitalization was 10/13 for a similar episode   Hospital Course:  1. Acute on chronic Pancreatitis: admitted to med surg, IV pain medications, anti emetics, and IV fluids. Repeat lipase levels came down to 80. She was started on clear liquid diet which was advanced,  And now she is tolerating regular diet. US abdomen confirmed calcific pancreas, with minimally dilated pancreatic duct. Liver function test are normal. Continue with creon as outpatient. 2. Hypokalemia: replete 3. Alcohol abuse: CIWA protocol was ordered, no signs of DT's. Sjhe is counseled to quit drinking alcohol.   4. Dehydration: Resolved with IV fluids.  5. Sarcoidosis; Stable 6. Diabetes mellitus: hbaic is 5.9, continue with metformin as outpatient. 7. CADs/p CABG: currently chest pain free, on aspirin and plavix,b blockers and lisinopril and pravachol . Resume all Home Medications. Will cut down HCTZ to 12.5 mg po daily as she came with severe dehydration. Patient to see  [facility] cardiology on 5/25, have asked her to discuss the rationale of using both aspirin and plavix as she has been taking these medications for past 13 yrs since her CABG 8. Uterine fibroids: Will follow with GYN as outpatient.    Procedures:  Abdominal ultrasound  Transvaginal ultrasound; multiple fibroids  Consultations:  None  Discharge Exam: Filed Vitals:   08/14/12 1434 08/14/12 2007 08/14/12 2130 08/15/12 0530  BP: 104/69  107/71 113/77  Pulse: 81 82 85 79  Temp: 97.8 F (36.6 C)  97.4 F (36.3 C) 98.5 F (36.9 C)  TempSrc: Oral  Oral Oral  Resp: 18 18 16 18   Height:      Weight:      SpO2: 96% 94% 96% 90%    General: Appear in no acute distress* Cardiovascular: S1s2 RRR Respiratory: Clear bilaterally Ext : No edema  Discharge Instructions  Discharge Orders   Future Orders Complete By Expires     Diet - low sodium heart healthy  As directed     Increase activity slowly  As directed         Medication List    TAKE these medications       ADVAIR DISKUS 250-50 MCG/DOSE Aepb  Generic drug:  Fluticasone-Salmeterol  Inhale 1 puff into the lungs every 12 (twelve) hours.     aspirin 325 MG tablet  Take 325 mg by mouth daily with lunch.     clopidogrel 75 MG tablet  Commonly known as:  PLAVIX  Take 75 mg by mouth daily with lunch.     hydrochlorothiazide 25 MG tablet  Commonly known as:  HYDRODIURIL  Take 0.5 tablets (12.5 mg total) by mouth every morning.     HYDROcodone-acetaminophen 5-325 MG per tablet  Commonly known as:  NORCO/VICODIN  Take 1-2 tablets by mouth every 4 (four) hours as needed.     lipase/protease/amylase 16109 UNITS Cpep  Commonly known as:  CREON-12/PANCREASE  Take 2 capsules by mouth 3 (three) times daily with meals.     lisinopril 10 MG tablet  Commonly known as:  PRINIVIL,ZESTRIL  Take 10 mg by mouth every morning.     metFORMIN 500 MG tablet  Commonly known as:  GLUCOPHAGE  Take 500 mg by mouth daily with lunch. Take  with food     metoprolol 50 MG tablet  Commonly known as:  LOPRESSOR  Take 50 mg by mouth 2 (two) times daily.     multivitamin with minerals Tabs  Take 1 tablet by mouth daily with lunch.     pravastatin 20 MG tablet  Commonly known as:  PRAVACHOL  Take 20 mg by mouth daily with lunch.       No Known Allergies    The results of significant diagnostics from this hospitalization (including imaging, microbiology, ancillary and laboratory) are listed below for reference.    Significant Diagnostic Studies: US Abdomen Complete  08/12/2012   *RADIOLOGY REPORT*  Clinical Data:  Pancreatitis, history diabetes, sarcoidosis, hypertension  ULTRASOUND ABDOMEN:  Technique:  Sonography of upper abdominal structures was performed.  Comparison:  03/22/2011  Gallbladder:  Normally distended without stones or wall thickening. No pericholecystic fluid or sonographic Murphy sign.  Common bile duct:  7 mm greatest diameter, minimally prominent for age.  No intrahepatic biliary dilatation.  Liver:  Normal appearance  IVC:  Normal appearance  Pancreas:  Heterogeneous echogenicity.  Shadowing echogenic foci at pancreatic head/uncinate corresponding to pancreatic calcifications on prior CT. Pancreatic ductal dilatation up to 7 mm diameter.  No definite focal mass.  Spleen:  Normal appearance, 8.2 cm length.  Right kidney:  10.3 cm length. Normal morphology without mass or hydronephrosis.  Left kidney:  10.1 cm length. Normal morphology without mass or hydronephrosis.  Aorta:  Distally obscured by bowel gas, proximal and midportions normal in caliber.  Other:  No free fluid  IMPRESSION: Calcific pancreatitis with pancreatic ductal dilatation of 7 mm diameter. Minimally prominent CBD 7 mm diameter without intrahepatic dilatation; recommend correlation with LFTs. Remainder of exam unremarkable.   Original Report Authenticated By: Ulyses Southward, M.D.   US Transvaginal Non-ob  08/12/2012   *RADIOLOGY REPORT*  Clinical  Data: Abnormal uterine bleeding.  TRANSABDOMINAL AND TRANSVAGINAL ULTRASOUND OF PELVIS Technique:  Both transabdominal and transvaginal ultrasound examinations of the pelvis were performed. Transabdominal technique was performed for global imaging of the pelvis including uterus, ovaries, adnexal regions, and pelvic cul-de-sac.  It was necessary to proceed with endovaginal exam following the transabdominal exam to visualize the endometrium, and adnexa, and ovaries as well as uterine body detail.  Comparison:  Ultrasound dated 09/12/2009  Findings:  Uterus: 8.8 x 6.0 x 6.2 cm, essentially unchanged.  3.8 x 3.2 x 3.6 cm anterior uterine fibroid.  1.8 x 1.5 x 2.0 cm posterior uterine fibroid.  3.1 x 3.1 x 3.4 cm lower uterine segment fibroid to the left.  These have all increased in size since the prior exam.  Endometrium: 5 mm.  Right ovary:  3.2 x 1.9 x 1.8 cm.  1.1 cm cyst on the right ovary.  Left ovary: Not visualized.  Other findings: No free fluid  IMPRESSION:  Increased size of the multiple uterine fibroids.  No thickening of the endometrium.  Follicular cyst on the right ovary.  Nonvisualization of the left ovary.   Original Report Authenticated By: Francene Boyers, M.D.   US Pelvis Complete  08/12/2012   *RADIOLOGY REPORT*  Clinical Data: Abnormal uterine bleeding.  TRANSABDOMINAL AND TRANSVAGINAL ULTRASOUND OF PELVIS Technique:  Both transabdominal and transvaginal ultrasound examinations of the pelvis were performed. Transabdominal technique was performed for global imaging of the pelvis including uterus, ovaries, adnexal regions, and pelvic cul-de-sac.  It was necessary to proceed with endovaginal exam following the transabdominal exam to visualize the endometrium, and adnexa, and ovaries as well as uterine body detail.  Comparison:  Ultrasound dated 09/12/2009  Findings:  Uterus: 8.8 x 6.0 x 6.2 cm, essentially unchanged.  3.8 x 3.2 x 3.6 cm anterior uterine fibroid.  1.8 x 1.5 x 2.0 cm posterior uterine  fibroid.  3.1 x 3.1 x 3.4 cm lower uterine segment fibroid to the left.  These have all increased in size since the prior exam.  Endometrium: 5 mm.  Right ovary:  3.2 x 1.9 x 1.8 cm.  1.1 cm cyst on the right ovary.  Left ovary: Not visualized.  Other findings: No free fluid  IMPRESSION: Increased size of the multiple uterine fibroids.  No thickening of the endometrium.  Follicular cyst on the right ovary.  Nonvisualization of the left ovary.   Original Report Authenticated By: Francene Boyers, M.D.    Microbiology: No results found for this or any previous visit (from the past 240 hour(s)).   Labs: Basic Metabolic Panel:  Recent Labs Lab 08/11/12 2130 08/12/12 1125  NA 135 138  K 3.3* 3.8  CL 99 105  CO2 21 24  GLUCOSE 130* 90  BUN 15 12  CREATININE 0.75 0.77  CALCIUM 9.5 8.3*   Liver Function Tests:  Recent Labs Lab 08/11/12 2130  AST 29  ALT 30  ALKPHOS 49  BILITOT 0.4  PROT 7.4  ALBUMIN 3.7    Recent Labs Lab 08/11/12 2130 08/12/12 0445 08/13/12 0910 08/14/12 1030  LIPASE >3000* 2003* 218* 80*   No results found for this basename: AMMONIA,  in the last 168 hours CBC:  Recent Labs Lab 08/11/12 2130 08/12/12 1125  WBC 10.4 9.9  NEUTROABS 6.5  --   HGB 15.7* 14.8  HCT 47.3* 45.0  MCV 93.8 96.2  PLT 285 232   Cardiac Enzymes: No results found for this basename: CKTOTAL, CKMB, CKMBINDEX, TROPONINI,  in the last 168 hours BNP: BNP (last 3 results) No results found for this basename: PROBNP,  in the last 8760 hours CBG:  Recent Labs Lab 08/14/12 1759 08/14/12 1954 08/14/12 2328 08/15/12 0422 08/15/12 0725  GLUCAP 84 156* 117* 91 80       Signed:  Sean Macwilliams S  [facility] 08/15/2012, 9:37 AM

## 2012-08-19 ENCOUNTER — Encounter (HOSPITAL_COMMUNITY): Payer: Self-pay | Admitting: Pharmacy Technician

## 2012-08-20 ENCOUNTER — Encounter: Payer: Self-pay | Admitting: *Deleted

## 2012-08-21 LAB — GLUCOSE, CAPILLARY: Glucose-Capillary: 123 mg/dL — ABNORMAL HIGH (ref 70–99)

## 2012-08-28 ENCOUNTER — Encounter (HOSPITAL_COMMUNITY)
Admission: RE | Admit: 2012-08-28 | Discharge: 2012-08-28 | Disposition: A | Discharge status: Home / Self Care | Payer: 59 | Source: Ambulatory Visit | Attending: Obstetrics and Gynecology | Admitting: Obstetrics and Gynecology

## 2012-08-28 ENCOUNTER — Encounter (HOSPITAL_COMMUNITY): Payer: Self-pay

## 2012-08-28 DIAGNOSIS — Z01818 Encounter for other preprocedural examination: Secondary | ICD-10-CM | POA: Insufficient documentation

## 2012-08-28 DIAGNOSIS — Z01812 Encounter for preprocedural laboratory examination: Secondary | ICD-10-CM | POA: Insufficient documentation

## 2012-08-28 LAB — CBC
HCT: 48.2 % — ABNORMAL HIGH (ref 36.0–46.0)
MCHC: 34.6 g/dL (ref 30.0–36.0)
RDW: 12.2 % (ref 11.5–15.5)

## 2012-08-28 LAB — BASIC METABOLIC PANEL
BUN: 16 mg/dL (ref 6–23)
GFR calc Af Amer: 88 mL/min — ABNORMAL LOW (ref >90)
GFR calc non Af Amer: 76 mL/min — ABNORMAL LOW (ref >90)
Potassium: 4.5 mEq/L (ref 3.5–5.1)

## 2012-08-28 NOTE — Patient Instructions (Signed)
Your procedure is scheduled on:09/04/13  Enter through the Main Entrance at :1200 noon Pick up desk phone and dial [phone] and inform us of your arrival.  Please call [phone] if you have any problems the morning of surgery.  Remember: Do not eat after midnight:WED----clear liquids ok until 9am on Thurs.  You may brush your teeth the morning of surgery.  Take these meds the morning of surgery with a sip of water:Lisinopril, Metoprolol HOLD METFORMIN FOR 24 HOURS PRIOR TO SURGERY  DO NOT wear jewelry, eye make-up, lipstick,body lotion, or dark fingernail polish.  (Polished toes are ok)   If you are to be admitted after surgery, leave suitcase in car until your room has been assigned. Patients discharged on the day of surgery will not be allowed to drive home. Wear loose fitting, comfortable clothes for your ride home.

## 2012-09-03 NOTE — H&P (Signed)
Marie [name] is an 47 y.o. female G3P3 with dysfunctional uterine bleeding and failed medical management with Mirena IUD who is here for surgical management with endometrial ablation.  Pertinent Gynecological History: Menses: flow is excessive with use of 6 pads or tampons on heaviest days Bleeding: dysfunctional uterine bleeding Contraception: tubal ligation DES exposure: denies Blood transfusions: none Sexually transmitted diseases: no past history Previous GYN Procedures: n/a  Last mammogram: normal Date: 03/2012 OB History: G3, P3   Menstrual History:  No LMP recorded. Patient is not currently having periods (Reason: IUD).    Past Medical History  Diagnosis Date  . Loss of weight   . Sarcoidosis   . Leiomyoma of uterus, unspecified   . Pure hypercholesterolemia   . Obesity, unspecified   . Chronic pancreatitis   . Coronary atherosclerosis of unspecified type of vessel, native or graft   . Type II or unspecified type diabetes mellitus without mention of complication, not stated as uncontrolled   . Hypertension   . Shortness of breath     none since stent 2 yrs ago  . Unspecified sleep apnea     no CPAP    Past Surgical History  Procedure Laterality Date  . Coronary angioplasty with stent placement  09/2009    "1"  . Coronary artery bypass graft  ~ 2000    CABG X3  . Tubal ligation  1997    Family History  Problem Relation Age of Onset  . Diabetes Mother   . Coronary artery disease Mother     Social History:  reports that she has been smoking Cigarettes.  She has a 12.5 pack-year smoking history. She has never used smokeless tobacco. She reports that  drinks alcohol. She reports that she does not use illicit drugs.  Allergies: No Known Allergies  Prescriptions prior to admission  Medication Sig Dispense Refill  . clopidogrel (PLAVIX) 75 MG tablet Take 75 mg by mouth daily with lunch.       . hydrochlorothiazide (HYDRODIURIL) 25 MG tablet Take 12.5 mg by  mouth every morning.      . lipase/protease/amylase (CREON-12/PANCREASE) 12000 UNITS CPEP Take 2 capsules by mouth 3 (three) times daily with meals.      Marland Kitchen lisinopril (PRINIVIL,ZESTRIL) 10 MG tablet Take 10 mg by mouth every morning.      . metFORMIN (GLUCOPHAGE) 500 MG tablet Take 500 mg by mouth daily with lunch. Take with food      . metoprolol (LOPRESSOR) 50 MG tablet Take 50 mg by mouth daily. Verified Metoprolol Tartrate, pt only takes once daily      . pravastatin (PRAVACHOL) 20 MG tablet Take 20 mg by mouth daily with lunch.       Marland Kitchen aspirin 325 MG tablet Take 325 mg by mouth daily with lunch.       Marland Kitchen HYDROcodone-acetaminophen (NORCO/VICODIN) 5-325 MG per tablet Take 1-2 tablets by mouth every 4 (four) hours as needed for pain.      . Multiple Vitamin (MULTIVITAMIN WITH MINERALS) TABS Take 1 tablet by mouth daily with lunch.        Review of Systems  All other systems reviewed and are negative.    Blood pressure 98/68, pulse 69, temperature 97.9 F (36.6 C), temperature source Oral, resp. rate 18, SpO2 98.00%. Physical Exam GENERAL: Well-developed, well-nourished female in no acute distress.  HEENT: Normocephalic, atraumatic. Sclerae anicteric.  NECK: Supple. Normal thyroid.  LUNGS: Clear to auscultation bilaterally.  HEART: Regular rate and rhythm. BREASTS: Symmetric  in size. No palpable masses or lymphadenopathy, skin changes, or nipple drainage. ABDOMEN: Soft, nontender, nondistended. No organomegaly. PELVIC: Normal external female genitalia. Vagina is pink and rugated.  Normal discharge. Normal appearing cervix. Uterus is normal in size. No adnexal mass or tenderness. EXTREMITIES: No cyanosis, clubbing, or edema, 2+ distal pulses.  Results for orders placed during the hospital encounter of 09/04/12 (from the past 24 hour(s))  GLUCOSE, CAPILLARY     Status: Abnormal   Collection Time    09/04/12 12:24 PM      Result Value Range   Glucose-Capillary 104 (*) 70 - 99 mg/dL     No results found. 07/2012 Pelvic ultrasound Findings:  Uterus: 8.8 x 6.0 x 6.2 cm, essentially unchanged. 3.8 x 3.2 x 3.6  cm anterior uterine fibroid. 1.8 x 1.5 x 2.0 cm posterior uterine  fibroid. 3.1 x 3.1 x 3.4 cm lower uterine segment fibroid to the  left. These have all increased in size since the prior exam.  Endometrium: 5 mm.  Right ovary: 3.2 x 1.9 x 1.8 cm. 1.1 cm cyst on the right ovary.  Left ovary: Not visualized.  Other findings: No free fluid  IMPRESSION:  Increased size of the multiple uterine fibroids. No thickening of  the endometrium.  07/2012 EmBx: negative for hyperplasia or carcinoma  Assessment/Plan: 47 yo with fibroid uterus and DUB here for endometrial ablation. - Risks, benefits and alternatives were explained including but not limited to risks of bleeding, infection, uterine perforation and damage to adjacent organs. Patient verbalized understanding and all questions were answered. - Will remove IUD in OR [name] 09/04/2012, 12:35 PM

## 2012-09-04 ENCOUNTER — Ambulatory Visit (HOSPITAL_COMMUNITY)
Admission: RE | Admit: 2012-09-04 | Discharge: 2012-09-04 | Disposition: A | Discharge status: Home / Self Care | Payer: 59 | Source: Ambulatory Visit | Attending: Obstetrics and Gynecology | Admitting: Obstetrics and Gynecology

## 2012-09-04 ENCOUNTER — Encounter (HOSPITAL_COMMUNITY)
Admission: RE | Disposition: A | Discharge status: Home / Self Care | Payer: Self-pay | Source: Ambulatory Visit | Attending: Obstetrics and Gynecology

## 2012-09-04 ENCOUNTER — Encounter (HOSPITAL_COMMUNITY): Payer: Self-pay | Admitting: Anesthesiology

## 2012-09-04 ENCOUNTER — Ambulatory Visit (HOSPITAL_COMMUNITY): Payer: 59 | Admitting: Anesthesiology

## 2012-09-04 DIAGNOSIS — N938 Other specified abnormal uterine and vaginal bleeding: Secondary | ICD-10-CM | POA: Insufficient documentation

## 2012-09-04 DIAGNOSIS — N854 Malposition of uterus: Secondary | ICD-10-CM | POA: Insufficient documentation

## 2012-09-04 DIAGNOSIS — N949 Unspecified condition associated with female genital organs and menstrual cycle: Secondary | ICD-10-CM | POA: Insufficient documentation

## 2012-09-04 DIAGNOSIS — N939 Abnormal uterine and vaginal bleeding, unspecified: Secondary | ICD-10-CM

## 2012-09-04 DIAGNOSIS — I10 Essential (primary) hypertension: Secondary | ICD-10-CM | POA: Insufficient documentation

## 2012-09-04 DIAGNOSIS — N926 Irregular menstruation, unspecified: Secondary | ICD-10-CM

## 2012-09-04 DIAGNOSIS — N83209 Unspecified ovarian cyst, unspecified side: Secondary | ICD-10-CM | POA: Insufficient documentation

## 2012-09-04 DIAGNOSIS — E119 Type 2 diabetes mellitus without complications: Secondary | ICD-10-CM | POA: Insufficient documentation

## 2012-09-04 HISTORY — PX: HYSTEROSCOPY WITH NOVASURE: SHX5574

## 2012-09-04 LAB — PREGNANCY, URINE: Preg Test, Ur: NEGATIVE

## 2012-09-04 SURGERY — HYSTEROSCOPY WITH NOVASURE
Anesthesia: General | Site: Vagina | Wound class: Clean Contaminated

## 2012-09-04 MED ORDER — FENTANYL CITRATE 0.05 MG/ML IJ SOLN
INTRAMUSCULAR | Status: DC | PRN
  Administered 2012-09-04 (×2): 50 ug via INTRAVENOUS

## 2012-09-04 MED ORDER — FENTANYL CITRATE 0.05 MG/ML IJ SOLN
25.0000 ug | INTRAMUSCULAR | Status: DC | PRN
  Administered 2012-09-04: 25 ug via INTRAVENOUS
  Administered 2012-09-04: 50 ug via INTRAVENOUS

## 2012-09-04 MED ORDER — ONDANSETRON HCL 4 MG/2ML IJ SOLN
INTRAMUSCULAR | Status: DC | PRN
  Administered 2012-09-04: 4 mg via INTRAVENOUS

## 2012-09-04 MED ORDER — CHLOROPROCAINE HCL 1 % IJ SOLN
INTRAMUSCULAR | Status: AC
  Filled 2012-09-04: qty 30

## 2012-09-04 MED ORDER — PROPOFOL 10 MG/ML IV EMUL
INTRAVENOUS | Status: AC
  Filled 2012-09-04: qty 20

## 2012-09-04 MED ORDER — PHENYLEPHRINE HCL 10 MG/ML IJ SOLN
INTRAMUSCULAR | Status: DC | PRN
  Administered 2012-09-04: 40 ug via INTRAVENOUS
  Administered 2012-09-04 (×2): 80 ug via INTRAVENOUS

## 2012-09-04 MED ORDER — ONDANSETRON HCL 4 MG/2ML IJ SOLN
4.0000 mg | Freq: Once | INTRAMUSCULAR | Status: AC | PRN
  Administered 2012-09-04: 4 mg via INTRAVENOUS

## 2012-09-04 MED ORDER — FENTANYL CITRATE 0.05 MG/ML IJ SOLN
INTRAMUSCULAR | Status: AC
  Filled 2012-09-04: qty 2

## 2012-09-04 MED ORDER — OXYCODONE-ACETAMINOPHEN 5-325 MG PO TABS: 1.0000 | ORAL_TABLET | ORAL | Status: DC | PRN

## 2012-09-04 MED ORDER — LIDOCAINE HCL (CARDIAC) 20 MG/ML IV SOLN
INTRAVENOUS | Status: AC
  Filled 2012-09-04: qty 5

## 2012-09-04 MED ORDER — ONDANSETRON HCL 4 MG/2ML IJ SOLN
INTRAMUSCULAR | Status: AC
  Filled 2012-09-04: qty 2

## 2012-09-04 MED ORDER — PHENYLEPHRINE 40 MCG/ML (10ML) SYRINGE FOR IV PUSH (FOR BLOOD PRESSURE SUPPORT)
PREFILLED_SYRINGE | INTRAVENOUS | Status: AC
  Filled 2012-09-04: qty 5

## 2012-09-04 MED ORDER — LACTATED RINGERS IR SOLN
Status: DC | PRN
  Administered 2012-09-04: 3000 mL

## 2012-09-04 MED ORDER — KETOROLAC TROMETHAMINE 30 MG/ML IJ SOLN
INTRAMUSCULAR | Status: AC
  Administered 2012-09-04: 15 mg via INTRAVENOUS
  Filled 2012-09-04: qty 1

## 2012-09-04 MED ORDER — FENTANYL CITRATE 0.05 MG/ML IJ SOLN
INTRAMUSCULAR | Status: AC
  Administered 2012-09-04: 25 ug via INTRAVENOUS
  Filled 2012-09-04: qty 2

## 2012-09-04 MED ORDER — MIDAZOLAM HCL 2 MG/2ML IJ SOLN
INTRAMUSCULAR | Status: AC
  Filled 2012-09-04: qty 2

## 2012-09-04 MED ORDER — IBUPROFEN 600 MG PO TABS: 600.0000 mg | ORAL_TABLET | Freq: Four times a day (QID) | ORAL | Status: DC | PRN

## 2012-09-04 MED ORDER — MIDAZOLAM HCL 5 MG/5ML IJ SOLN
INTRAMUSCULAR | Status: DC | PRN
  Administered 2012-09-04 (×2): 1 mg via INTRAVENOUS

## 2012-09-04 MED ORDER — MEPERIDINE HCL 25 MG/ML IJ SOLN: 6.2500 mg | INTRAMUSCULAR | Status: DC | PRN

## 2012-09-04 MED ORDER — LACTATED RINGERS IV SOLN
INTRAVENOUS | Status: DC
  Administered 2012-09-04: 13:00:00 via INTRAVENOUS

## 2012-09-04 MED ORDER — FENTANYL CITRATE 0.05 MG/ML IJ SOLN
INTRAMUSCULAR | Status: AC
  Administered 2012-09-04: 50 ug via INTRAVENOUS
  Filled 2012-09-04: qty 2

## 2012-09-04 MED ORDER — CHLOROPROCAINE HCL 1 % IJ SOLN
INTRAMUSCULAR | Status: DC | PRN
  Administered 2012-09-04: 10 mL

## 2012-09-04 MED ORDER — KETOROLAC TROMETHAMINE 30 MG/ML IJ SOLN: 15.0000 mg | Freq: Once | INTRAMUSCULAR | Status: AC | PRN

## 2012-09-04 SURGICAL SUPPLY — 11 items
ABLATOR ENDOMETRIAL BIPOLAR (ABLATOR) ×3 IMPLANT
CATH ROBINSON RED A/P 16FR (CATHETERS) ×3 IMPLANT
CLOTH BEACON ORANGE TIMEOUT ST (SAFETY) ×3 IMPLANT
CONTAINER PREFILL 10% NBF 60ML (FORM) IMPLANT
GLOVE BIOGEL PI IND STRL 6.5 (GLOVE) ×2 IMPLANT
GLOVE BIOGEL PI INDICATOR 6.5 (GLOVE) ×1
GLOVE SURG SS PI 6.0 STRL IVOR (GLOVE) ×3 IMPLANT
GOWN PREVENTION PLUS LG XLONG (DISPOSABLE) ×6 IMPLANT
PACK HYSTEROSCOPY LF (CUSTOM PROCEDURE TRAY) ×3 IMPLANT
TOWEL OR 17X24 6PK STRL BLUE (TOWEL DISPOSABLE) ×6 IMPLANT
WATER STERILE IRR 1000ML POUR (IV SOLUTION) ×3 IMPLANT

## 2012-09-04 NOTE — Op Note (Signed)
PREOPERATIVE DIAGNOSIS:  Irregular uterine bleeding. POSTOPERATIVE DIAGNOSIS: The same PROCEDURE: Endometrial ablation with hysteroscopy- NovaSure SURGEON:  Dr. Catalina Antigua   INDICATIONS: 47 y.o. yo [phone]  with DUB s/p failed medical management with Mirena IUD here for endometrial ablation. Risks of surgery were discussed with the patient including but not limited to: bleeding which may require transfusion; infection which may require antibiotics; injury to uterus or surrounding organs; need for additional procedures including laparotomy or laparoscopy; and other postoperative/anesthesia complications. Written informed consent was obtained.    FINDINGS:  A 8 week size anteverted uterus.  Diffuse polypoid endometrium.  Normal ostia bilaterally.  ANESTHESIA:   General, paracervical block. INTRAVENOUS FLUIDS:  600 ml of LR FLUID DEFICITS:  150 ml of Lactated Ringers with a large unmeasured amount of fluid on the floor ESTIMATED BLOOD LOSS:  Less than 20 ml. SPECIMENS: None COMPLICATIONS:  None immediate.  PROCEDURE DETAILS:  The patient was taken to the operating room where general anesthesia was administered and was found to be adequate.  After an adequate timeout was performed, she was placed in the dorsal lithotomy position and examined; then prepped and draped in the sterile manner.   Her bladder was catheterized for an unmeasured amount of clear, yellow urine. A speculum was then placed in the patient's vagina and a single tooth tenaculum was applied to the anterior lip of the cervix.  A paracervical block using 1% Nesicaine was administered.  The Mirena IUD was removed using a ring forceps and grasping the strings visualized at external os. The uterine cavity was sounded to 8 cm and the cervix was dilated manually with hagar dilators to accommodate the 2.7 mm hysteroscope.  Using the hysteroscope to determine the level of the internal os, a uterine cavity of 5 cm was determined. Once the  cervix was dilated, the hysteroscope was inserted under direct visualization.  The uterine cavity was carefully examined, both ostia were recognized, and diffusely proliferative endometrium with polypoid fragments was noted. The cervix was further dilated to 8 mm, the NovaSure device was inserted and a cavity width of 3.6 mm was determined. Using a power of 99, for 1 min 11 sec, the endometrial ablation was performed. The hysteroscope was then re-introduced into the uterine cavity, confirming complete ablation of the endometrium. The tenaculum was removed from the anterior lip of the cervix and the vaginal speculum was removed after noting good hemostasis.  The patient tolerated the procedure well and was taken to the recovery area awake, extubated and in stable condition.

## 2012-09-04 NOTE — Anesthesia Preprocedure Evaluation (Addendum)
Anesthesia Evaluation  Patient identified by MRN, date of birth, ID band Patient awake    Reviewed: Allergy & Precautions, H&P , NPO status , Patient's Chart, lab work & pertinent test results, reviewed documented beta blocker date and time   History of Anesthesia Complications Negative for: history of anesthetic complications  Airway Mallampati: II TM Distance: >3 FB Neck ROM: full    Dental no notable dental hx.    Pulmonary shortness of breath, sleep apnea , COPD breath sounds clear to auscultation  Pulmonary exam normal       Cardiovascular Exercise Tolerance: Good hypertension, + CAD negative cardio ROS  Rhythm:regular Rate:Normal     Neuro/Psych negative neurological ROS  negative psych ROS   GI/Hepatic negative GI ROS, Neg liver ROS, GERD-  ,  Endo/Other  diabetes  Renal/GU negative Renal ROS     Musculoskeletal   Abdominal Normal abdominal exam  (+)   Peds negative pediatric ROS (+)  Hematology negative hematology ROS (+)   Anesthesia Other Findings Loss of weight     Sarcoidosis        Leiomyoma of uterus, unspecified     Pure hypercholesterolemia        Obesity, unspecified     Chronic pancreatitis        Coronary atherosclerosis of unspecified type of vessel, native or graft     Type II or unspecified type diabetes mellitus without mention of complication, not stated as uncontrolled        Hypertension     Shortness of breath   none since stent 2 yrs ago    Unspecified sleep apnea   no CPAP    Reproductive/Obstetrics negative OB ROS                          Anesthesia Physical Anesthesia Plan  ASA: III  Anesthesia Plan: General LMA   Post-op Pain Management:    Induction:   Airway Management Planned:   Additional Equipment:   Intra-op Plan:   Post-operative Plan:   Informed Consent: I have reviewed the patients History and Physical, chart, labs and discussed the  procedure including the risks, benefits and alternatives for the proposed anesthesia with the patient or authorized representative who has indicated his/her understanding and acceptance.   Dental Advisory Given  Plan Discussed with: CRNA, Surgeon and Anesthesiologist  Anesthesia Plan Comments:         Anesthesia Quick Evaluation

## 2012-09-04 NOTE — Transfer of Care (Signed)
Immediate Anesthesia Transfer of Care Note  Patient: Marie [name]  Procedure(s) Performed: Procedure(s) with comments: HYSTEROSCOPY WITH NOVASURE (N/A) - with removal intrauterine device  Patient Location: PACU  Anesthesia Type:General  Level of Consciousness: awake, alert  and oriented  Airway & Oxygen Therapy: Patient Spontanous Breathing and Patient connected to nasal cannula oxygen  Post-op Assessment: Report given to PACU RN and Post -op Vital signs reviewed and stable  Post vital signs: Reviewed and stable  Complications: No apparent anesthesia complications

## 2012-09-04 NOTE — Anesthesia Postprocedure Evaluation (Signed)
Anesthesia Post Note  Patient: Marie [name]  Procedure(s) Performed: Procedure(s) (LRB): HYSTEROSCOPY WITH NOVASURE (N/A)  Anesthesia type: General  Patient location: PACU  Post pain: Pain level controlled  Post assessment: Post-op Vital signs reviewed  Last Vitals:  Filed Vitals:   09/04/12 1415  BP: 91/59  Pulse: 63  Temp:   Resp: 18    Post vital signs: Reviewed  Level of consciousness: sedated  Complications: No apparent anesthesia complications

## 2012-09-05 ENCOUNTER — Encounter (HOSPITAL_COMMUNITY): Payer: Self-pay | Admitting: Obstetrics and Gynecology

## 2012-09-18 ENCOUNTER — Ambulatory Visit: Payer: 59 | Admitting: Obstetrics and Gynecology

## 2012-10-17 ENCOUNTER — Telehealth: Payer: Self-pay | Admitting: *Deleted

## 2012-10-17 NOTE — Telephone Encounter (Signed)
Marie [name] called and left a message stating Dr. Jolayne Panther had called in a pain medicine for the ablation she had done about 1.5 months ago. States she is having excrutiating pain and is taking ibuprofen 600 mg and has already taken 3 today without relief. Request a call.  Per chart review had ablation 09/04/12.  Called [name]. She c/o cramping " like my cycle is coming on"=9, states she has not had any bleeding .States she has taken 7 ibuprofen 600mg  since yesterday, has used a heating pad without relief.  Informed her she should not take more than one ibuprofen 600mg  in 6 hours as that can cause organ damage to kidneys/liver. Informed her I would need to talk with a doctor and call her back..We also discussed she does have a follow up appt scheduled 10/29/12.

## 2012-10-17 NOTE — Telephone Encounter (Signed)
Discussed with Dr. Debroah Loop and per his instruction called [facility] and instructed her to come to [facility] for evaluation. She agrees with plan and states she will come for evaluation.

## 2012-10-29 ENCOUNTER — Ambulatory Visit: Payer: 59 | Admitting: Obstetrics and Gynecology

## 2012-10-30 ENCOUNTER — Ambulatory Visit: Payer: 59 | Admitting: Obstetrics and Gynecology

## 2013-01-12 ENCOUNTER — Encounter (HOSPITAL_COMMUNITY): Payer: Self-pay | Admitting: Emergency Medicine

## 2013-01-12 ENCOUNTER — Emergency Department (HOSPITAL_COMMUNITY)
Admission: EM | Admit: 2013-01-12 | Discharge: 2013-01-12 | Disposition: A | Discharge status: Home / Self Care | Payer: 59 | Source: Home / Self Care

## 2013-01-12 DIAGNOSIS — L509 Urticaria, unspecified: Secondary | ICD-10-CM

## 2013-01-12 DIAGNOSIS — T7840XA Allergy, unspecified, initial encounter: Secondary | ICD-10-CM

## 2013-01-12 MED ORDER — PREDNISONE 20 MG PO TABS: ORAL_TABLET | ORAL | Status: DC

## 2013-01-12 MED ORDER — LORATADINE 10 MG PO TABS: 10.0000 mg | ORAL_TABLET | Freq: Every day | ORAL | Status: DC

## 2013-01-12 NOTE — ED Notes (Signed)
C/o allergic reaction to not able to say to what.

## 2013-01-12 NOTE — ED Provider Notes (Signed)
CSN: [id number]     Arrival date & time 01/12/13  0909 History   First MD Initiated Contact with Patient 01/12/13 [phone]     Chief Complaint  Patient presents with  . Allergic Reaction   (Consider location/radiation/quality/duration/timing/severity/associated sxs/prior Treatment) HPI Comments: 47 year old female complaining of a rash for 2 weeks. They have been getting worse or developed larger bumps with much pruritus yesterday. She took a picture of her right eye this morning where there were large wheals covering most of the antero-medial aspect of the thigh. These have since dissipated. The face has developed swelling as well. She states that she had a sensation of swollen time and throat earlier this morning but that has also abated. As of now she is in no distress I do not see evidence of wheals or papules but she does have mild facial swelling.  Patient is a 47 y.o. female presenting with allergic reaction.  Allergic Reaction Presenting symptoms: no difficulty swallowing and no wheezing     Past Medical History  Diagnosis Date  . Loss of weight   . Sarcoidosis   . Leiomyoma of uterus, unspecified   . Pure hypercholesterolemia   . Obesity, unspecified   . Chronic pancreatitis   . Coronary atherosclerosis of unspecified type of vessel, native or graft   . Type II or unspecified type diabetes mellitus without mention of complication, not stated as uncontrolled   . Hypertension   . Shortness of breath     none since stent 2 yrs ago  . Unspecified sleep apnea     no CPAP   Past Surgical History  Procedure Laterality Date  . Coronary angioplasty with stent placement  09/2009    "1"  . Coronary artery bypass graft  ~ 2000    CABG X3  . Tubal ligation  1997  . Hysteroscopy with novasure N/A 09/04/2012    Procedure: HYSTEROSCOPY WITH NOVASURE;  Surgeon: Catalina Antigua, MD;  Location: [facility];  Service: Gynecology;  Laterality: N/A;  with removal intrauterine device   Family  History  Problem Relation Age of Onset  . Diabetes Mother   . Coronary artery disease Mother    History  Substance Use Topics  . Smoking status: Current Every Day Smoker -- 0.50 packs/day for 25 years    Types: Cigarettes  . Smokeless tobacco: Never Used  . Alcohol Use: 0.0 oz/week     Comment: wine every other day   OB History   Grav Para Term Preterm Abortions TAB SAB Ect Mult Living   3 3 3       3      Review of Systems  Constitutional: Negative for fever, diaphoresis, activity change and fatigue.  HENT: Negative for congestion, postnasal drip, rhinorrhea, sinus pressure, sore throat, trouble swallowing and voice change.   Eyes: Negative.   Respiratory: Negative for cough, choking, shortness of breath, wheezing and stridor.   Cardiovascular: Negative.   Gastrointestinal: Negative.   Genitourinary: Negative.   Neurological: Negative for dizziness, tremors, syncope and light-headedness.    Allergies  Review of patient's allergies indicates no known allergies.  Home Medications   Current Outpatient Rx  Name  Route  Sig  Dispense  Refill  . aspirin 325 MG tablet   Oral   Take 325 mg by mouth daily with lunch.          . clopidogrel (PLAVIX) 75 MG tablet   Oral   Take 75 mg by mouth daily with lunch.          Marland Kitchen  hydrochlorothiazide (HYDRODIURIL) 25 MG tablet   Oral   Take 12.5 mg by mouth every morning.         Marland Kitchen ibuprofen (ADVIL,MOTRIN) 600 MG tablet   Oral   Take 1 tablet (600 mg total) by mouth every 6 (six) hours as needed for pain.   30 tablet   1   . lipase/protease/amylase (CREON-12/PANCREASE) 12000 UNITS CPEP   Oral   Take 2 capsules by mouth 3 (three) times daily with meals.         Marland Kitchen lisinopril (PRINIVIL,ZESTRIL) 10 MG tablet   Oral   Take 10 mg by mouth every morning.         . loratadine (CLARITIN) 10 MG tablet   Oral   Take 1 tablet (10 mg total) by mouth daily.   20 tablet   0   . metFORMIN (GLUCOPHAGE) 500 MG tablet   Oral    Take 500 mg by mouth daily with lunch. Take with food         . metoprolol (LOPRESSOR) 50 MG tablet   Oral   Take 50 mg by mouth daily. Verified Metoprolol Tartrate, pt only takes once daily         . Multiple Vitamin (MULTIVITAMIN WITH MINERALS) TABS   Oral   Take 1 tablet by mouth daily with lunch.         . oxyCODONE-acetaminophen (PERCOCET/ROXICET) 5-325 MG per tablet   Oral   Take 1 tablet by mouth every 4 (four) hours as needed for pain.   20 tablet   0   . pravastatin (PRAVACHOL) 20 MG tablet   Oral   Take 20 mg by mouth daily with lunch.          . predniSONE (DELTASONE) 20 MG tablet      3 tabs po today, then 2 tabs q d for 5 days, then 1 tab for 3 days   16 tablet   0    BP 164/87  Pulse 66  Temp(Src) 97.2 F (36.2 C) (Oral)  Resp 16  SpO2 97%  LMP 08/15/2012 Physical Exam  Nursing note and vitals reviewed. Constitutional: She is oriented to person, place, and time. She appears well-developed and well-nourished. No distress.  HENT:  Right Ear: External ear normal.  Left Ear: External ear normal.  Mouth/Throat: Oropharynx is clear and moist. No oropharyngeal exudate.  No intraoral swelling. No edema of the tongue. Oropharynx is clear and airway is widely patent. The lips have minor swelling. There is minor swelling involving the cheeks. No swelling or discoloration of the throat.  Eyes: Conjunctivae and EOM are normal.  Neck: Normal range of motion. Neck supple.  Cardiovascular: Normal rate, regular rhythm and normal heart sounds.   Pulmonary/Chest: Breath sounds normal. No respiratory distress. She has no wheezes.  Musculoskeletal: She exhibits no edema and no tenderness.  Lymphadenopathy:    She has no cervical adenopathy.  Neurological: She is alert and oriented to person, place, and time. She exhibits normal muscle tone.  Skin: Skin is warm and dry. No rash noted. She is not diaphoretic.  Psychiatric: She has a normal mood and affect.    ED  Course  Procedures (including critical care time) Labs Review Labs Reviewed - No data to display Imaging Review No results found.    MDM   1. Allergic reaction, initial encounter   2. Urticaria      Stop the lisinopril for now; call your physician today  about what happened  and have him think about a possible change in your blood pressure medicine. Not certain what the etiology for this reaction is. Claritin 10 mg daily may have Benadryl for worsening the hives as needed. Prednisone as directed. Realize that this may elevate her blood sugar. Followup your PCP this week.  Hayden Rasmussen, NP 01/12/13 1008

## 2013-01-14 NOTE — ED Provider Notes (Signed)
Medical screening examination/treatment/procedure(s) were performed by non-physician practitioner and as supervising physician I was immediately available for consultation/collaboration.  Leslee Home, M.D.  Reuben Likes, MD 01/14/13 [phone]

## 2013-05-02 ENCOUNTER — Emergency Department (HOSPITAL_COMMUNITY)
Admission: EM | Admit: 2013-05-02 | Discharge: 2013-05-03 | Disposition: A | Discharge status: Home / Self Care | Payer: 59 | Attending: Emergency Medicine | Admitting: Emergency Medicine

## 2013-05-02 DIAGNOSIS — Z9889 Other specified postprocedural states: Secondary | ICD-10-CM | POA: Insufficient documentation

## 2013-05-02 DIAGNOSIS — K861 Other chronic pancreatitis: Secondary | ICD-10-CM | POA: Insufficient documentation

## 2013-05-02 DIAGNOSIS — I10 Essential (primary) hypertension: Secondary | ICD-10-CM | POA: Insufficient documentation

## 2013-05-02 DIAGNOSIS — I2510 Atherosclerotic heart disease of native coronary artery without angina pectoris: Secondary | ICD-10-CM | POA: Insufficient documentation

## 2013-05-02 DIAGNOSIS — IMO0002 Reserved for concepts with insufficient information to code with codable children: Secondary | ICD-10-CM | POA: Insufficient documentation

## 2013-05-02 DIAGNOSIS — Z8742 Personal history of other diseases of the female genital tract: Secondary | ICD-10-CM | POA: Insufficient documentation

## 2013-05-02 DIAGNOSIS — E119 Type 2 diabetes mellitus without complications: Secondary | ICD-10-CM | POA: Insufficient documentation

## 2013-05-02 DIAGNOSIS — Z8619 Personal history of other infectious and parasitic diseases: Secondary | ICD-10-CM | POA: Insufficient documentation

## 2013-05-02 DIAGNOSIS — Z9861 Coronary angioplasty status: Secondary | ICD-10-CM | POA: Insufficient documentation

## 2013-05-02 DIAGNOSIS — Z79899 Other long term (current) drug therapy: Secondary | ICD-10-CM | POA: Insufficient documentation

## 2013-05-02 DIAGNOSIS — F17200 Nicotine dependence, unspecified, uncomplicated: Secondary | ICD-10-CM | POA: Insufficient documentation

## 2013-05-02 DIAGNOSIS — K859 Acute pancreatitis without necrosis or infection, unspecified: Secondary | ICD-10-CM | POA: Insufficient documentation

## 2013-05-02 DIAGNOSIS — Z9851 Tubal ligation status: Secondary | ICD-10-CM | POA: Insufficient documentation

## 2013-05-02 DIAGNOSIS — Z7982 Long term (current) use of aspirin: Secondary | ICD-10-CM | POA: Insufficient documentation

## 2013-05-02 DIAGNOSIS — Z3202 Encounter for pregnancy test, result negative: Secondary | ICD-10-CM | POA: Insufficient documentation

## 2013-05-02 DIAGNOSIS — E669 Obesity, unspecified: Secondary | ICD-10-CM | POA: Insufficient documentation

## 2013-05-02 DIAGNOSIS — Z951 Presence of aortocoronary bypass graft: Secondary | ICD-10-CM | POA: Insufficient documentation

## 2013-05-02 DIAGNOSIS — Z7902 Long term (current) use of antithrombotics/antiplatelets: Secondary | ICD-10-CM | POA: Insufficient documentation

## 2013-05-02 LAB — URINALYSIS, ROUTINE W REFLEX MICROSCOPIC
Bilirubin Urine: NEGATIVE
Glucose, UA: NEGATIVE mg/dL
Hgb urine dipstick: NEGATIVE
KETONES UR: NEGATIVE mg/dL
LEUKOCYTES UA: NEGATIVE
NITRITE: NEGATIVE
PH: 5 (ref 5.0–8.0)
Protein, ur: NEGATIVE mg/dL
SPECIFIC GRAVITY, URINE: 1.006 (ref 1.005–1.030)
Urobilinogen, UA: 0.2 mg/dL (ref 0.0–1.0)

## 2013-05-02 LAB — GLUCOSE, CAPILLARY: GLUCOSE-CAPILLARY: 95 mg/dL (ref 70–99)

## 2013-05-02 LAB — PREGNANCY, URINE: Preg Test, Ur: NEGATIVE

## 2013-05-02 MED ORDER — SODIUM CHLORIDE 0.9 % IV BOLUS (SEPSIS)
1000.0000 mL | Freq: Once | INTRAVENOUS | Status: AC
  Administered 2013-05-03: 1000 mL via INTRAVENOUS

## 2013-05-02 MED ORDER — FAMOTIDINE IN NACL 20-0.9 MG/50ML-% IV SOLN
20.0000 mg | Freq: Once | INTRAVENOUS | Status: AC
  Administered 2013-05-03: 20 mg via INTRAVENOUS
  Filled 2013-05-02: qty 50

## 2013-05-02 MED ORDER — ONDANSETRON HCL 4 MG/2ML IJ SOLN
4.0000 mg | Freq: Once | INTRAMUSCULAR | Status: AC
  Administered 2013-05-03: 4 mg via INTRAVENOUS
  Filled 2013-05-02: qty 2

## 2013-05-02 MED ORDER — HYDROMORPHONE HCL PF 1 MG/ML IJ SOLN
1.0000 mg | Freq: Once | INTRAMUSCULAR | Status: AC
  Administered 2013-05-03: 1 mg via INTRAVENOUS
  Filled 2013-05-02: qty 1

## 2013-05-02 NOTE — ED Notes (Signed)
Pt here from home via ems for evaluation of abdominal pain, pt has hx of pancreatitis and went to the [place] recently where she drank alcohol. Pt thinks she may have had a "flare up" states 10/10 pain with some relief on it's on sometimes. Also c/o dysuria,  Denies any fever. Alert and oriented, nad noted.

## 2013-05-02 NOTE — ED Provider Notes (Signed)
CSN: [id number]     Arrival date & time 05/02/13  2253 History   First MD Initiated Contact with Patient 05/02/13 2319     Chief Complaint  Patient presents with  . Abdominal Pain   (Consider location/radiation/quality/duration/timing/severity/associated sxs/prior Treatment) HPI 48 yo woman with history of alcoholic pancreatitis presents with 4d of epigastric pain with nausea. Pain is aching, burning and nonradiating. 10/10. NOthing makes pain worse or better. Feels like multiple previous episodes of pancreaitis flare. Patient notes that she returned a few days ago from a trip to the [place] where she drank heavily. She continues to drink daily and had a "a couple" of drinks today PTA.   She denies vomiting, melena, diarrhea, hematochezia. Denies history of abdominal surgeries.    Past Medical History  Diagnosis Date  . Loss of weight   . Sarcoidosis   . Leiomyoma of uterus, unspecified   . Pure hypercholesterolemia   . Obesity, unspecified   . Chronic pancreatitis   . Coronary atherosclerosis of unspecified type of vessel, native or graft   . Type II or unspecified type diabetes mellitus without mention of complication, not stated as uncontrolled   . Hypertension   . Shortness of breath     none since stent 2 yrs ago  . Unspecified sleep apnea     no CPAP   Past Surgical History  Procedure Laterality Date  . Coronary angioplasty with stent placement  09/2009    "1"  . Coronary artery bypass graft  ~ 2000    CABG X3  . Tubal ligation  1997  . Hysteroscopy with novasure N/A 09/04/2012    Procedure: HYSTEROSCOPY WITH NOVASURE;  Surgeon: Mora Bellman, MD;  Location: [facility];  Service: Gynecology;  Laterality: N/A;  with removal intrauterine device   Family History  Problem Relation Age of Onset  . Diabetes Mother   . Coronary artery disease Mother    History  Substance Use Topics  . Smoking status: Current Every Day Smoker -- 0.50 packs/day for 25 years    Types: Cigarettes   . Smokeless tobacco: Never Used  . Alcohol Use: 0.0 oz/week     Comment: wine every other day   OB History   Grav Para Term Preterm Abortions TAB SAB Ect Mult Living   3 3 3       3      Review of Systems Ten point review of symptoms performed and is negative with the exception of symptoms noted above.   Allergies  Review of patient's allergies indicates no known allergies.  Home Medications   Current Outpatient Rx  Name  Route  Sig  Dispense  Refill  . aspirin 325 MG tablet   Oral   Take 325 mg by mouth daily with lunch.          . clopidogrel (PLAVIX) 75 MG tablet   Oral   Take 75 mg by mouth daily with lunch.          . hydrochlorothiazide (HYDRODIURIL) 25 MG tablet   Oral   Take 12.5 mg by mouth every morning.         Marland Kitchen ibuprofen (ADVIL,MOTRIN) 600 MG tablet   Oral   Take 1 tablet (600 mg total) by mouth every 6 (six) hours as needed for pain.   30 tablet   1   . lipase/protease/amylase (CREON-12/PANCREASE) 12000 UNITS CPEP   Oral   Take 2 capsules by mouth 3 (three) times daily with meals.         Marland Kitchen  lisinopril (PRINIVIL,ZESTRIL) 10 MG tablet   Oral   Take 10 mg by mouth every morning.         . loratadine (CLARITIN) 10 MG tablet   Oral   Take 1 tablet (10 mg total) by mouth daily.   20 tablet   0   . metFORMIN (GLUCOPHAGE) 500 MG tablet   Oral   Take 500 mg by mouth daily with lunch. Take with food         . metoprolol (LOPRESSOR) 50 MG tablet   Oral   Take 50 mg by mouth daily. Verified Metoprolol Tartrate, pt only takes once daily         . Multiple Vitamin (MULTIVITAMIN WITH MINERALS) TABS   Oral   Take 1 tablet by mouth daily with lunch.         . oxyCODONE-acetaminophen (PERCOCET/ROXICET) 5-325 MG per tablet   Oral   Take 1 tablet by mouth every 4 (four) hours as needed for pain.   20 tablet   0   . pravastatin (PRAVACHOL) 20 MG tablet   Oral   Take 20 mg by mouth daily with lunch.          . predniSONE (DELTASONE)  20 MG tablet      3 tabs po today, then 2 tabs q d for 5 days, then 1 tab for 3 days   16 tablet   0    BP 110/73  Pulse 80  Temp(Src) 97.2 F (36.2 C) (Oral)  Resp 20  SpO2 94%  LMP 08/15/2012 Physical Exam Gen: well developed and well nourished appearing Head: NCAT Eyes: PERL, EOMI Nose: no epistaixis or rhinorrhea Mouth/throat: mucosa is moist and pink Neck: supple, no stridor Lungs: CTA B, no wheezing, rhonchi or rales CV: RRR, no murmur, extremities appear well perfused.  Abd: mildly tender midline epigastrium, no peritoneal signs, nondistended Back: no ttp, no cva ttp Skin: warm and dry Ext: normal to inspection, no dependent edema Neuro: CN ii-xii grossly intact, no focal deficits Psyche; normal affect,  calm and cooperative.   ED Course  Procedures (including critical care time) Labs Review  Results for orders placed during the hospital encounter of 05/02/13 (from the past 24 hour(s))  URINALYSIS, ROUTINE W REFLEX MICROSCOPIC     Status: Abnormal   Collection Time    05/02/13 11:23 PM      Result Value Range   Color, Urine STRAW (*) YELLOW   APPearance CLEAR  CLEAR   Specific Gravity, Urine 1.006  1.005 - 1.030   pH 5.0  5.0 - 8.0   Glucose, UA NEGATIVE  NEGATIVE mg/dL   Hgb urine dipstick NEGATIVE  NEGATIVE   Bilirubin Urine NEGATIVE  NEGATIVE   Ketones, ur NEGATIVE  NEGATIVE mg/dL   Protein, ur NEGATIVE  NEGATIVE mg/dL   Urobilinogen, UA 0.2  0.0 - 1.0 mg/dL   Nitrite NEGATIVE  NEGATIVE   Leukocytes, UA NEGATIVE  NEGATIVE  PREGNANCY, URINE     Status: None   Collection Time    05/02/13 11:23 PM      Result Value Range   Preg Test, Ur NEGATIVE  NEGATIVE  GLUCOSE, CAPILLARY     Status: None   Collection Time    05/02/13 11:32 PM      Result Value Range   Glucose-Capillary 95  70 - 99 mg/dL  CBC WITH DIFFERENTIAL     Status: Abnormal   Collection Time    05/02/13 11:50 PM  Result Value Range   WBC 6.2  4.0 - 10.5 K/uL   RBC 4.91  3.87 -  5.11 MIL/uL   Hemoglobin 16.6 (*) 12.0 - 15.0 g/dL   HCT 46.0  36.0 - 46.0 %   MCV 93.7  78.0 - 100.0 fL   MCH 33.8  26.0 - 34.0 pg   MCHC 36.1 (*) 30.0 - 36.0 g/dL   RDW 12.4  11.5 - 15.5 %   Platelets 254  150 - 400 K/uL   Neutrophils Relative % 58  43 - 77 %   Neutro Abs 3.6  1.7 - 7.7 K/uL   Lymphocytes Relative 33  12 - 46 %   Lymphs Abs 2.1  0.7 - 4.0 K/uL   Monocytes Relative 8  3 - 12 %   Monocytes Absolute 0.5  0.1 - 1.0 K/uL   Eosinophils Relative 1  0 - 5 %   Eosinophils Absolute 0.1  0.0 - 0.7 K/uL   Basophils Relative 0  0 - 1 %   Basophils Absolute 0.0  0.0 - 0.1 K/uL  COMPREHENSIVE METABOLIC PANEL     Status: Abnormal   Collection Time    05/02/13 11:50 PM      Result Value Range   Sodium 143  137 - 147 mEq/L   Potassium 3.5 (*) 3.7 - 5.3 mEq/L   Chloride 103  96 - 112 mEq/L   CO2 22  19 - 32 mEq/L   Glucose, Bld 92  70 - 99 mg/dL   BUN 9  6 - 23 mg/dL   Creatinine, Ser 0.65  0.50 - 1.10 mg/dL   Calcium 9.6  8.4 - 10.5 mg/dL   Total Protein 7.9  6.0 - 8.3 g/dL   Albumin 3.9  3.5 - 5.2 g/dL   AST 76 (*) 0 - 37 U/L   ALT 64 (*) 0 - 35 U/L   Alkaline Phosphatase 66  39 - 117 U/L   Total Bilirubin 0.5  0.3 - 1.2 mg/dL   GFR calc non Af Amer >90  >90 mL/min   GFR calc Af Amer >90  >90 mL/min  LACTIC ACID, PLASMA     Status: Abnormal   Collection Time    05/02/13 11:50 PM      Result Value Range   Lactic Acid, Venous 2.3 (*) 0.5 - 2.2 mmol/L  LIPASE, BLOOD     Status: Abnormal   Collection Time    05/02/13 11:50 PM      Result Value Range   Lipase 230 (*) 11 - 59 U/L   EKG: nsr, no acute ischemic changes, normal intervals, normal axis, normal qrs complex  MDM  Patient with acute excacerbation of chronic alcoholic pancreatitis following alcohol intake. She is nontoxic and well hydrated appearing. FEeling better. Asks for water. Will po challenge in anticipation of d/c home. Patient counseled re: need for abstinence from alcohol.     Elyn Peers,  MD 05/03/13 [phone]

## 2013-05-02 NOTE — ED Notes (Signed)
Dr Manly at bedside 

## 2013-05-03 ENCOUNTER — Encounter (HOSPITAL_COMMUNITY): Payer: Self-pay | Admitting: Emergency Medicine

## 2013-05-03 LAB — COMPREHENSIVE METABOLIC PANEL
ALBUMIN: 3.9 g/dL (ref 3.5–5.2)
ALT: 64 U/L — ABNORMAL HIGH (ref 0–35)
AST: 76 U/L — AB (ref 0–37)
Alkaline Phosphatase: 66 U/L (ref 39–117)
BUN: 9 mg/dL (ref 6–23)
CALCIUM: 9.6 mg/dL (ref 8.4–10.5)
CO2: 22 mEq/L (ref 19–32)
CREATININE: 0.65 mg/dL (ref 0.50–1.10)
Chloride: 103 mEq/L (ref 96–112)
GFR calc Af Amer: >90 mL/min (ref >90)
GFR calc non Af Amer: >90 mL/min (ref >90)
Glucose, Bld: 92 mg/dL (ref 70–99)
Potassium: 3.5 mEq/L — ABNORMAL LOW (ref 3.7–5.3)
Sodium: 143 mEq/L (ref 137–147)
TOTAL PROTEIN: 7.9 g/dL (ref 6.0–8.3)
Total Bilirubin: 0.5 mg/dL (ref 0.3–1.2)

## 2013-05-03 LAB — CBC WITH DIFFERENTIAL/PLATELET
BASOS ABS: 0 10*3/uL (ref 0.0–0.1)
BASOS PCT: 0 % (ref 0–1)
EOS PCT: 1 % (ref 0–5)
Eosinophils Absolute: 0.1 10*3/uL (ref 0.0–0.7)
HEMATOCRIT: 46 % (ref 36.0–46.0)
Hemoglobin: 16.6 g/dL — ABNORMAL HIGH (ref 12.0–15.0)
Lymphocytes Relative: 33 % (ref 12–46)
Lymphs Abs: 2.1 10*3/uL (ref 0.7–4.0)
MCH: 33.8 pg (ref 26.0–34.0)
MCHC: 36.1 g/dL — AB (ref 30.0–36.0)
MCV: 93.7 fL (ref 78.0–100.0)
MONO ABS: 0.5 10*3/uL (ref 0.1–1.0)
Monocytes Relative: 8 % (ref 3–12)
Neutro Abs: 3.6 10*3/uL (ref 1.7–7.7)
Neutrophils Relative %: 58 % (ref 43–77)
PLATELETS: 254 10*3/uL (ref 150–400)
RBC: 4.91 MIL/uL (ref 3.87–5.11)
RDW: 12.4 % (ref 11.5–15.5)
WBC: 6.2 10*3/uL (ref 4.0–10.5)

## 2013-05-03 LAB — LACTIC ACID, PLASMA: Lactic Acid, Venous: 2.3 mmol/L — ABNORMAL HIGH (ref 0.5–2.2)

## 2013-05-03 LAB — LIPASE, BLOOD: LIPASE: 230 U/L — AB (ref 11–59)

## 2013-05-03 MED ORDER — HYDROMORPHONE HCL PF 1 MG/ML IJ SOLN
1.0000 mg | Freq: Once | INTRAMUSCULAR | Status: AC
Start: 2013-05-03 — End: 2013-05-03
  Administered 2013-05-03: 1 mg via INTRAVENOUS
  Filled 2013-05-03: qty 1

## 2013-05-03 MED ORDER — SODIUM CHLORIDE 0.9 % IV BOLUS (SEPSIS)
1000.0000 mL | Freq: Once | INTRAVENOUS | Status: AC
  Administered 2013-05-03: 1000 mL via INTRAVENOUS

## 2013-05-03 MED ORDER — HYDROCODONE-ACETAMINOPHEN 5-325 MG PO TABS
2.0000 | ORAL_TABLET | ORAL | Status: DC | PRN
Start: 2013-05-03 — End: 2013-07-29

## 2013-05-03 NOTE — Discharge Instructions (Signed)
Acute Pancreatitis Acute pancreatitis is a disease in which the pancreas becomes suddenly inflamed. The pancreas is a large gland located behind your stomach. The pancreas produces enzymes that help digest food. The pancreas also releases the hormones glucagon and insulin that help regulate blood sugar. Damage to the pancreas occurs when the digestive enzymes from the pancreas are activated and begin attacking the pancreas before being released into the intestine. Most acute attacks last a couple of days and can cause serious complications. Some people become dehydrated and develop low blood pressure. In severe cases, bleeding into the pancreas can lead to shock and can be life-threatening. The lungs, heart, and kidneys may fail. CAUSES  Pancreatitis can happen to anyone. In some cases, the cause is unknown. Most cases are caused by:  Alcohol abuse.  Gallstones. Other less common causes are:  Certain medicines.  Exposure to certain chemicals.  Infection.  Damage caused by an accident (trauma).  Abdominal surgery. SYMPTOMS   Pain in the upper abdomen that may radiate to the back.  Tenderness and swelling of the abdomen.  Nausea and vomiting. DIAGNOSIS  Your caregiver will perform a physical exam. Blood and stool tests may be done to confirm the diagnosis. Imaging tests may also be done, such as X-rays, CT scans, or an ultrasound of the abdomen. TREATMENT  Treatment usually requires a stay in the hospital. Treatment may include:  Pain medicine.  Fluid replacement through an intravenous line (IV).  Placing a tube in the stomach to remove stomach contents and control vomiting.  Not eating for 3 or 4 days. This gives your pancreas a rest, because enzymes are not being produced that can cause further damage.  Antibiotic medicines if your condition is caused by an infection.  Surgery of the pancreas or gallbladder. HOME CARE INSTRUCTIONS   Follow the diet advised by your  caregiver. This may involve avoiding alcohol and decreasing the amount of fat in your diet.  Eat smaller, more frequent meals. This reduces the amount of digestive juices the pancreas produces.  Drink enough fluids to keep your urine clear or pale yellow.  Only take over-the-counter or prescription medicines as directed by your caregiver.  Avoid drinking alcohol if it caused your condition.  Do not smoke.  Get plenty of rest.  Check your blood sugar at home as directed by your caregiver.  Keep all follow-up appointments as directed by your caregiver. SEEK MEDICAL CARE IF:   You do not recover as quickly as expected.  You develop new or worsening symptoms.  You have persistent pain, weakness, or nausea.  You recover and then have another episode of pain. SEEK IMMEDIATE MEDICAL CARE IF:   You are unable to eat or keep fluids down.  Your pain becomes severe.  You have a fever or persistent symptoms for more than 2 to 3 days.  You have a fever and your symptoms suddenly get worse.  Your skin or the white part of your eyes turn yellow (jaundice).  You develop vomiting.  You feel dizzy, or you faint.  Your blood sugar is high (over 300 mg/dL). MAKE SURE YOU:   Understand these instructions.  Will watch your condition.  Will get help right away if you are not doing well or get worse. Document Released: 03/12/2005 Document Revised: 09/11/2011 Document Reviewed: 06/21/2011 [facility] Patient Information 2014 [place].   PLEASE ABSTAIN FROM ALL ALCOHOL INTAKE TO PREVENT FURTHER EPISODES OF ACUTE PANCREATITIS.

## 2013-07-27 ENCOUNTER — Other Ambulatory Visit: Payer: Self-pay | Admitting: Family Medicine

## 2013-07-27 ENCOUNTER — Ambulatory Visit
Admission: RE | Admit: 2013-07-27 | Discharge: 2013-07-27 | Disposition: A | Discharge status: Home / Self Care | Payer: 59 | Source: Ambulatory Visit | Attending: Family Medicine | Admitting: Family Medicine

## 2013-07-27 DIAGNOSIS — R05 Cough: Secondary | ICD-10-CM

## 2013-07-27 DIAGNOSIS — R059 Cough, unspecified: Secondary | ICD-10-CM

## 2013-07-28 ENCOUNTER — Other Ambulatory Visit: Payer: Self-pay | Admitting: Family Medicine

## 2013-07-28 ENCOUNTER — Encounter (HOSPITAL_COMMUNITY): Payer: Self-pay | Admitting: Emergency Medicine

## 2013-07-28 ENCOUNTER — Other Ambulatory Visit (HOSPITAL_COMMUNITY): Payer: Self-pay | Admitting: Family Medicine

## 2013-07-28 DIAGNOSIS — R748 Abnormal levels of other serum enzymes: Secondary | ICD-10-CM

## 2013-07-28 DIAGNOSIS — K859 Acute pancreatitis without necrosis or infection, unspecified: Principal | ICD-10-CM | POA: Diagnosis present

## 2013-07-28 DIAGNOSIS — E860 Dehydration: Secondary | ICD-10-CM | POA: Diagnosis present

## 2013-07-28 DIAGNOSIS — I10 Essential (primary) hypertension: Secondary | ICD-10-CM | POA: Diagnosis present

## 2013-07-28 DIAGNOSIS — F17200 Nicotine dependence, unspecified, uncomplicated: Secondary | ICD-10-CM | POA: Diagnosis present

## 2013-07-28 DIAGNOSIS — I959 Hypotension, unspecified: Secondary | ICD-10-CM | POA: Diagnosis present

## 2013-07-28 DIAGNOSIS — F1020 Alcohol dependence, uncomplicated: Secondary | ICD-10-CM | POA: Diagnosis present

## 2013-07-28 DIAGNOSIS — I2510 Atherosclerotic heart disease of native coronary artery without angina pectoris: Secondary | ICD-10-CM | POA: Diagnosis present

## 2013-07-28 DIAGNOSIS — Z79899 Other long term (current) drug therapy: Secondary | ICD-10-CM

## 2013-07-28 DIAGNOSIS — Z7982 Long term (current) use of aspirin: Secondary | ICD-10-CM

## 2013-07-28 DIAGNOSIS — E7800 Pure hypercholesterolemia, unspecified: Secondary | ICD-10-CM | POA: Diagnosis present

## 2013-07-28 DIAGNOSIS — R55 Syncope and collapse: Secondary | ICD-10-CM | POA: Diagnosis present

## 2013-07-28 DIAGNOSIS — E119 Type 2 diabetes mellitus without complications: Secondary | ICD-10-CM | POA: Diagnosis present

## 2013-07-28 DIAGNOSIS — K861 Other chronic pancreatitis: Secondary | ICD-10-CM | POA: Diagnosis present

## 2013-07-28 DIAGNOSIS — Z951 Presence of aortocoronary bypass graft: Secondary | ICD-10-CM

## 2013-07-28 DIAGNOSIS — R040 Epistaxis: Secondary | ICD-10-CM | POA: Diagnosis present

## 2013-07-28 DIAGNOSIS — G8929 Other chronic pain: Secondary | ICD-10-CM | POA: Diagnosis present

## 2013-07-28 DIAGNOSIS — Z9861 Coronary angioplasty status: Secondary | ICD-10-CM

## 2013-07-28 DIAGNOSIS — Z8249 Family history of ischemic heart disease and other diseases of the circulatory system: Secondary | ICD-10-CM

## 2013-07-28 LAB — CBC WITH DIFFERENTIAL/PLATELET
Basophils Absolute: 0 10*3/uL (ref 0.0–0.1)
Basophils Relative: 0 % (ref 0–1)
Eosinophils Absolute: 0.1 10*3/uL (ref 0.0–0.7)
Eosinophils Relative: 1 % (ref 0–5)
HEMATOCRIT: 47.3 % — AB (ref 36.0–46.0)
HEMOGLOBIN: 16.1 g/dL — AB (ref 12.0–15.0)
LYMPHS ABS: 2.5 10*3/uL (ref 0.7–4.0)
Lymphocytes Relative: 33 % (ref 12–46)
MCH: 32.7 pg (ref 26.0–34.0)
MCHC: 34 g/dL (ref 30.0–36.0)
MCV: 96.1 fL (ref 78.0–100.0)
MONOS PCT: 10 % (ref 3–12)
Monocytes Absolute: 0.7 10*3/uL (ref 0.1–1.0)
NEUTROS ABS: 4.3 10*3/uL (ref 1.7–7.7)
Neutrophils Relative %: 56 % (ref 43–77)
Platelets: 231 10*3/uL (ref 150–400)
RBC: 4.92 MIL/uL (ref 3.87–5.11)
RDW: 12.5 % (ref 11.5–15.5)
WBC: 7.6 10*3/uL (ref 4.0–10.5)

## 2013-07-28 NOTE — ED Notes (Signed)
Having abd. Lower left quad.  Onset ~ 7 pm. Sharp and constant. Lac. laceration to  bridge of nose from a fall. Fell due to abd. Pain. Lac. Controlled bleeding.

## 2013-07-29 ENCOUNTER — Inpatient Hospital Stay (HOSPITAL_COMMUNITY)
Admission: EM | Admit: 2013-07-29 | Discharge: 2013-07-31 | DRG: 440 | Disposition: A | Discharge status: Home / Self Care | Payer: 59 | Attending: Internal Medicine | Admitting: Internal Medicine

## 2013-07-29 ENCOUNTER — Encounter (HOSPITAL_COMMUNITY): Payer: Self-pay | Admitting: General Practice

## 2013-07-29 DIAGNOSIS — R740 Nonspecific elevation of levels of transaminase and lactic acid dehydrogenase [LDH]: Secondary | ICD-10-CM

## 2013-07-29 DIAGNOSIS — I519 Heart disease, unspecified: Secondary | ICD-10-CM

## 2013-07-29 DIAGNOSIS — R7401 Elevation of levels of liver transaminase levels: Secondary | ICD-10-CM

## 2013-07-29 DIAGNOSIS — E876 Hypokalemia: Secondary | ICD-10-CM

## 2013-07-29 DIAGNOSIS — R109 Unspecified abdominal pain: Secondary | ICD-10-CM

## 2013-07-29 DIAGNOSIS — G4730 Sleep apnea, unspecified: Secondary | ICD-10-CM

## 2013-07-29 DIAGNOSIS — D259 Leiomyoma of uterus, unspecified: Secondary | ICD-10-CM

## 2013-07-29 DIAGNOSIS — R55 Syncope and collapse: Secondary | ICD-10-CM | POA: Diagnosis present

## 2013-07-29 DIAGNOSIS — E669 Obesity, unspecified: Secondary | ICD-10-CM

## 2013-07-29 DIAGNOSIS — F17200 Nicotine dependence, unspecified, uncomplicated: Secondary | ICD-10-CM

## 2013-07-29 DIAGNOSIS — I2510 Atherosclerotic heart disease of native coronary artery without angina pectoris: Secondary | ICD-10-CM | POA: Diagnosis present

## 2013-07-29 DIAGNOSIS — D869 Sarcoidosis, unspecified: Secondary | ICD-10-CM

## 2013-07-29 DIAGNOSIS — N939 Abnormal uterine and vaginal bleeding, unspecified: Secondary | ICD-10-CM

## 2013-07-29 DIAGNOSIS — K861 Other chronic pancreatitis: Secondary | ICD-10-CM | POA: Diagnosis present

## 2013-07-29 DIAGNOSIS — F1010 Alcohol abuse, uncomplicated: Secondary | ICD-10-CM | POA: Diagnosis present

## 2013-07-29 DIAGNOSIS — R040 Epistaxis: Secondary | ICD-10-CM

## 2013-07-29 DIAGNOSIS — I959 Hypotension, unspecified: Secondary | ICD-10-CM

## 2013-07-29 DIAGNOSIS — E119 Type 2 diabetes mellitus without complications: Secondary | ICD-10-CM

## 2013-07-29 DIAGNOSIS — E7800 Pure hypercholesterolemia, unspecified: Secondary | ICD-10-CM

## 2013-07-29 DIAGNOSIS — S0121XA Laceration without foreign body of nose, initial encounter: Secondary | ICD-10-CM

## 2013-07-29 DIAGNOSIS — R634 Abnormal weight loss: Secondary | ICD-10-CM

## 2013-07-29 DIAGNOSIS — J449 Chronic obstructive pulmonary disease, unspecified: Secondary | ICD-10-CM

## 2013-07-29 DIAGNOSIS — K219 Gastro-esophageal reflux disease without esophagitis: Secondary | ICD-10-CM

## 2013-07-29 DIAGNOSIS — R1010 Upper abdominal pain, unspecified: Secondary | ICD-10-CM | POA: Diagnosis present

## 2013-07-29 DIAGNOSIS — E860 Dehydration: Secondary | ICD-10-CM | POA: Diagnosis present

## 2013-07-29 DIAGNOSIS — K859 Acute pancreatitis without necrosis or infection, unspecified: Principal | ICD-10-CM | POA: Diagnosis present

## 2013-07-29 HISTORY — DX: Obstructive sleep apnea (adult) (pediatric): G47.33

## 2013-07-29 HISTORY — DX: Headache: R51

## 2013-07-29 HISTORY — DX: Type 2 diabetes mellitus without complications: E11.9

## 2013-07-29 HISTORY — DX: Heart failure, unspecified: I50.9

## 2013-07-29 LAB — COMPREHENSIVE METABOLIC PANEL
ALK PHOS: 61 U/L (ref 39–117)
ALK PHOS: 48 U/L (ref 39–117)
ALT: 54 U/L — ABNORMAL HIGH (ref 0–35)
ALT: 43 U/L — AB (ref 0–35)
AST: 61 U/L — ABNORMAL HIGH (ref 0–37)
AST: 44 U/L — ABNORMAL HIGH (ref 0–37)
Albumin: 3.7 g/dL (ref 3.5–5.2)
Albumin: 3.2 g/dL — ABNORMAL LOW (ref 3.5–5.2)
BILIRUBIN TOTAL: 0.5 mg/dL (ref 0.3–1.2)
BUN: 14 mg/dL (ref 6–23)
BUN: 10 mg/dL (ref 6–23)
CHLORIDE: 100 meq/L (ref 96–112)
CHLORIDE: 107 meq/L (ref 96–112)
CO2: 23 mEq/L (ref 19–32)
CO2: 20 mEq/L (ref 19–32)
Calcium: 9.6 mg/dL (ref 8.4–10.5)
Calcium: 8.4 mg/dL (ref 8.4–10.5)
Creatinine, Ser: 0.76 mg/dL (ref 0.50–1.10)
Creatinine, Ser: 0.61 mg/dL (ref 0.50–1.10)
GFR calc non Af Amer: >90 mL/min (ref >90)
GLUCOSE: 105 mg/dL — AB (ref 70–99)
GLUCOSE: 85 mg/dL (ref 70–99)
POTASSIUM: 4.1 meq/L (ref 3.7–5.3)
POTASSIUM: 3.9 meq/L (ref 3.7–5.3)
Sodium: 138 mEq/L (ref 137–147)
Sodium: 143 mEq/L (ref 137–147)
Total Bilirubin: 0.4 mg/dL (ref 0.3–1.2)
Total Protein: 7.5 g/dL (ref 6.0–8.3)
Total Protein: 6.1 g/dL (ref 6.0–8.3)

## 2013-07-29 LAB — URINALYSIS, ROUTINE W REFLEX MICROSCOPIC
BILIRUBIN URINE: NEGATIVE
GLUCOSE, UA: NEGATIVE mg/dL
Hgb urine dipstick: NEGATIVE
KETONES UR: NEGATIVE mg/dL
Nitrite: NEGATIVE
PROTEIN: NEGATIVE mg/dL
Specific Gravity, Urine: 1.008 (ref 1.005–1.030)
UROBILINOGEN UA: 0.2 mg/dL (ref 0.0–1.0)
pH: 5 (ref 5.0–8.0)

## 2013-07-29 LAB — TROPONIN I
Troponin I: <0.3 ng/mL (ref <0.30)
Troponin I: <0.3 ng/mL (ref <0.30)

## 2013-07-29 LAB — CBC
HCT: 42.1 % (ref 36.0–46.0)
HEMOGLOBIN: 14.2 g/dL (ref 12.0–15.0)
MCH: 32.6 pg (ref 26.0–34.0)
MCHC: 33.7 g/dL (ref 30.0–36.0)
MCV: 96.6 fL (ref 78.0–100.0)
Platelets: 188 10*3/uL (ref 150–400)
RBC: 4.36 MIL/uL (ref 3.87–5.11)
RDW: 12.5 % (ref 11.5–15.5)
WBC: 5 10*3/uL (ref 4.0–10.5)

## 2013-07-29 LAB — I-STAT TROPONIN, ED: TROPONIN I, POC: 0.01 ng/mL (ref 0.00–0.08)

## 2013-07-29 LAB — URINE MICROSCOPIC-ADD ON

## 2013-07-29 LAB — LIPASE, BLOOD: Lipase: 143 U/L — ABNORMAL HIGH (ref 11–59)

## 2013-07-29 LAB — I-STAT CG4 LACTIC ACID, ED: Lactic Acid, Venous: 2.2 mmol/L (ref 0.5–2.2)

## 2013-07-29 LAB — GLUCOSE, CAPILLARY
Glucose-Capillary: 143 mg/dL — ABNORMAL HIGH (ref 70–99)
Glucose-Capillary: 76 mg/dL (ref 70–99)

## 2013-07-29 MED ORDER — LORAZEPAM 1 MG PO TABS: 1.0000 mg | ORAL_TABLET | Freq: Four times a day (QID) | ORAL | Status: DC | PRN

## 2013-07-29 MED ORDER — LORAZEPAM 2 MG/ML IJ SOLN
0.0000 mg | Freq: Four times a day (QID) | INTRAMUSCULAR | Status: AC
  Filled 2013-07-29: qty 1

## 2013-07-29 MED ORDER — HYDROMORPHONE HCL PF 1 MG/ML IJ SOLN
1.0000 mg | INTRAMUSCULAR | Status: AC | PRN
  Administered 2013-07-29: 1 mg via INTRAVENOUS
  Filled 2013-07-29 (×3): qty 1

## 2013-07-29 MED ORDER — ENOXAPARIN SODIUM 40 MG/0.4ML ~~LOC~~ SOLN
40.0000 mg | SUBCUTANEOUS | Status: DC
  Administered 2013-07-29 – 2013-07-31 (×3): 40 mg via SUBCUTANEOUS
  Filled 2013-07-29 (×5): qty 0.4

## 2013-07-29 MED ORDER — THIAMINE HCL 100 MG/ML IJ SOLN
100.0000 mg | Freq: Every day | INTRAMUSCULAR | Status: DC
  Filled 2013-07-29: qty 2
  Filled 2013-07-29: qty 1

## 2013-07-29 MED ORDER — SODIUM CHLORIDE 0.9 % IV SOLN
INTRAVENOUS | Status: AC
  Administered 2013-07-29: 06:00:00 via INTRAVENOUS

## 2013-07-29 MED ORDER — ONDANSETRON HCL 4 MG PO TABS
4.0000 mg | ORAL_TABLET | Freq: Four times a day (QID) | ORAL | Status: DC | PRN
  Administered 2013-07-29: 4 mg via ORAL
  Filled 2013-07-29: qty 1

## 2013-07-29 MED ORDER — SODIUM CHLORIDE 0.9 % IV SOLN
1000.0000 mL | Freq: Once | INTRAVENOUS | Status: AC
  Administered 2013-07-29: 1000 mL via INTRAVENOUS

## 2013-07-29 MED ORDER — ONDANSETRON HCL 4 MG/2ML IJ SOLN: 4.0000 mg | Freq: Four times a day (QID) | INTRAMUSCULAR | Status: DC | PRN

## 2013-07-29 MED ORDER — LORAZEPAM 2 MG/ML IJ SOLN
1.0000 mg | Freq: Four times a day (QID) | INTRAMUSCULAR | Status: DC | PRN
  Administered 2013-07-29: 1 mg via INTRAVENOUS

## 2013-07-29 MED ORDER — SODIUM CHLORIDE 0.9 % IV BOLUS (SEPSIS): 1000.0000 mL | INTRAVENOUS | Status: DC | PRN

## 2013-07-29 MED ORDER — SODIUM CHLORIDE 0.9 % IV SOLN
Freq: Once | INTRAVENOUS | Status: AC
  Administered 2013-07-29: 05:00:00 via INTRAVENOUS

## 2013-07-29 MED ORDER — ONDANSETRON HCL 4 MG/2ML IJ SOLN
4.0000 mg | Freq: Once | INTRAMUSCULAR | Status: AC
  Administered 2013-07-29: 4 mg via INTRAVENOUS
  Filled 2013-07-29: qty 2

## 2013-07-29 MED ORDER — HYDROMORPHONE HCL PF 1 MG/ML IJ SOLN
1.0000 mg | INTRAMUSCULAR | Status: DC | PRN
  Administered 2013-07-29 – 2013-07-31 (×10): 1 mg via INTRAVENOUS
  Filled 2013-07-29 (×8): qty 1

## 2013-07-29 MED ORDER — HYDROMORPHONE HCL PF 1 MG/ML IJ SOLN
1.0000 mg | Freq: Once | INTRAMUSCULAR | Status: AC
  Administered 2013-07-29: 1 mg via INTRAVENOUS
  Filled 2013-07-29: qty 1

## 2013-07-29 MED ORDER — HYDROMORPHONE HCL PF 1 MG/ML IJ SOLN
INTRAMUSCULAR | Status: AC
  Administered 2013-07-29: 1 mg via INTRAVENOUS
  Filled 2013-07-29: qty 1

## 2013-07-29 MED ORDER — SODIUM CHLORIDE 0.9 % IV SOLN
INTRAVENOUS | Status: DC
Start: 2013-07-29 — End: 2013-07-31
  Administered 2013-07-29 – 2013-07-31 (×5): via INTRAVENOUS

## 2013-07-29 MED ORDER — FOLIC ACID 1 MG PO TABS
1.0000 mg | ORAL_TABLET | Freq: Every day | ORAL | Status: DC
  Administered 2013-07-29 – 2013-07-31 (×3): 1 mg via ORAL
  Filled 2013-07-29 (×3): qty 1

## 2013-07-29 MED ORDER — ONDANSETRON HCL 4 MG/2ML IJ SOLN: 4.0000 mg | Freq: Three times a day (TID) | INTRAMUSCULAR | Status: AC | PRN

## 2013-07-29 MED ORDER — VITAMIN B-1 100 MG PO TABS
100.0000 mg | ORAL_TABLET | Freq: Every day | ORAL | Status: DC
  Administered 2013-07-29 – 2013-07-31 (×3): 100 mg via ORAL
  Filled 2013-07-29 (×3): qty 1

## 2013-07-29 MED ORDER — ASPIRIN EC 81 MG PO TBEC
81.0000 mg | DELAYED_RELEASE_TABLET | Freq: Every day | ORAL | Status: DC
  Filled 2013-07-29: qty 1

## 2013-07-29 MED ORDER — LORAZEPAM 2 MG/ML IJ SOLN: 0.0000 mg | Freq: Two times a day (BID) | INTRAMUSCULAR | Status: DC

## 2013-07-29 MED ORDER — CLOPIDOGREL BISULFATE 75 MG PO TABS
75.0000 mg | ORAL_TABLET | Freq: Every day | ORAL | Status: DC
  Administered 2013-07-29 – 2013-07-31 (×3): 75 mg via ORAL
  Filled 2013-07-29 (×4): qty 1

## 2013-07-29 MED ORDER — SODIUM CHLORIDE 0.9 % IV SOLN: 1000.0000 mL | INTRAVENOUS | Status: DC

## 2013-07-29 MED ORDER — ASPIRIN 325 MG PO TABS
325.0000 mg | ORAL_TABLET | Freq: Every day | ORAL | Status: DC
  Administered 2013-07-29 – 2013-07-31 (×3): 325 mg via ORAL
  Filled 2013-07-29 (×3): qty 1

## 2013-07-29 MED ORDER — SODIUM CHLORIDE 0.9 % IV SOLN: INTRAVENOUS | Status: DC

## 2013-07-29 MED ORDER — ADULT MULTIVITAMIN W/MINERALS CH
1.0000 | ORAL_TABLET | Freq: Every day | ORAL | Status: DC
  Administered 2013-07-29 – 2013-07-31 (×3): 1 via ORAL
  Filled 2013-07-29 (×3): qty 1

## 2013-07-29 NOTE — ED Notes (Signed)
Pt does not have to use the bathroom at this time. (NT requested urine sample)

## 2013-07-29 NOTE — ED Notes (Signed)
Pt sitting up in bed, talking on the phone at this time.

## 2013-07-29 NOTE — ED Notes (Signed)
Patient complaining of nausea and pain at this time.  MD aware.  New orders for nausea medication and a second bolus of NS.

## 2013-07-29 NOTE — ED Notes (Signed)
Care transferred, report received Marya Amsler, RN.

## 2013-07-29 NOTE — ED Provider Notes (Signed)
CSN: [id number]     Arrival date & time 07/28/13  2252 History   First MD Initiated Contact with Patient 07/29/13 0325     Chief Complaint  Patient presents with  . Abdominal Pain     (Consider location/radiation/quality/duration/timing/severity/associated sxs/prior Treatment) Patient is a 48 y.o. female presenting with abdominal pain. The history is provided by the patient.  Abdominal Pain She states that she started having upper abdominal pain this afternoon. Pain is sharp and severe and she rates it 10/10. There is radiation to the back. She denies nausea or vomiting. She did have several episodes of loose stools and had a syncopal episode and hit the bridge of her nose when she fell. Syncope was preceded by lightheadedness. She also did have a nosebleed. She has a history of pancreatitis and admits to having consumed significant amount of alcohol in several days ago. She has not taken anything for pain. Nothing makes better nothing makes it worse. She denies any chest pain, heaviness, tightness, pressure.  Past Medical History  Diagnosis Date  . Loss of weight   . Sarcoidosis   . Leiomyoma of uterus, unspecified   . Pure hypercholesterolemia   . Obesity, unspecified   . Chronic pancreatitis   . Coronary atherosclerosis of unspecified type of vessel, native or graft   . Type II or unspecified type diabetes mellitus without mention of complication, not stated as uncontrolled   . Hypertension   . Shortness of breath     none since stent 2 yrs ago  . Unspecified sleep apnea     no CPAP   Past Surgical History  Procedure Laterality Date  . Coronary angioplasty with stent placement  09/2009    "1"  . Coronary artery bypass graft  ~ 2000    CABG X3  . Tubal ligation  1997  . Hysteroscopy with novasure N/A 09/04/2012    Procedure: HYSTEROSCOPY WITH NOVASURE;  Surgeon: Mora Bellman, MD;  Location: [facility];  Service: Gynecology;  Laterality: N/A;  with removal intrauterine device    Family History  Problem Relation Age of Onset  . Diabetes Mother   . Coronary artery disease Mother    History  Substance Use Topics  . Smoking status: Current Every Day Smoker -- 0.50 packs/day for 25 years    Types: Cigarettes  . Smokeless tobacco: Never Used  . Alcohol Use: 0.0 oz/week     Comment: wine every other day   OB History   Grav Para Term Preterm Abortions TAB SAB Ect Mult Living   3 3 3       3      Review of Systems  Gastrointestinal: Positive for abdominal pain.  All other systems reviewed and are negative.     Allergies  Review of patient's allergies indicates no known allergies.  Home Medications   Prior to Admission medications   Medication Sig Start Date End Date Taking? Authorizing Provider  aspirin 325 MG tablet Take 325 mg by mouth daily.    Yes Historical Provider, MD  clopidogrel (PLAVIX) 75 MG tablet Take 75 mg by mouth daily with breakfast.    Yes Historical Provider, MD  hydrochlorothiazide (HYDRODIURIL) 25 MG tablet Take 12.5 mg by mouth every morning. 08/15/12  Yes Oswald Hillock, MD  lisinopril (PRINIVIL,ZESTRIL) 10 MG tablet Take 10 mg by mouth daily.    Yes Historical Provider, MD  losartan (COZAAR) 50 MG tablet Take 50 mg by mouth daily.   Yes Historical Provider, MD  metFORMIN (  GLUCOPHAGE) 500 MG tablet Take 500 mg by mouth daily with breakfast. Take with food   Yes Historical Provider, MD  metoprolol (LOPRESSOR) 50 MG tablet Take 50 mg by mouth daily. Verified Metoprolol Tartrate, pt only takes once daily   Yes Historical Provider, MD  pravastatin (PRAVACHOL) 20 MG tablet Take 20 mg by mouth daily.    Yes Historical Provider, MD   BP 83/56  Pulse 57  Temp(Src) 98.2 F (36.8 C) (Oral)  Resp 13  SpO2 100%  LMP 08/15/2012 Physical Exam  Nursing note and vitals reviewed.  48 year old female, resting comfortably and in no acute distress. Vital signs are significant for hypertension with blood pressure 83/56. Oxygen saturation is 100%,  which is normal. Head is normocephalic. PERRLA, EOMI. Oropharynx is clear. There is a superficial laceration of the bridge of the nose which does not require any repair. Examination of the nares shows no obvious recent bleeding site. Neck is nontender and supple without adenopathy or JVD. Back is nontender and there is no CVA tenderness. Lungs are clear without rales, wheezes, or rhonchi. Chest is nontender. Heart has regular rate and rhythm without murmur. Abdomen is soft, flat, with moderate tenderness in the epigastric area without rebound or guarding. There are no masses or hepatosplenomegaly and peristalsis is hypoactive. Extremities have no cyanosis or edema, full range of motion is present. Skin is warm and dry without rash. Neurologic: Mental status is normal, cranial nerves are intact, there are no motor or sensory deficits.  ED Course  Procedures (including critical care time) Labs Review Results for orders placed during the hospital encounter of 07/29/13  CBC WITH DIFFERENTIAL      Result Value Ref Range   WBC 7.6  4.0 - 10.5 K/uL   RBC 4.92  3.87 - 5.11 MIL/uL   Hemoglobin 16.1 (*) 12.0 - 15.0 g/dL   HCT 47.3 (*) 36.0 - 46.0 %   MCV 96.1  78.0 - 100.0 fL   MCH 32.7  26.0 - 34.0 pg   MCHC 34.0  30.0 - 36.0 g/dL   RDW 12.5  11.5 - 15.5 %   Platelets 231  150 - 400 K/uL   Neutrophils Relative % 56  43 - 77 %   Neutro Abs 4.3  1.7 - 7.7 K/uL   Lymphocytes Relative 33  12 - 46 %   Lymphs Abs 2.5  0.7 - 4.0 K/uL   Monocytes Relative 10  3 - 12 %   Monocytes Absolute 0.7  0.1 - 1.0 K/uL   Eosinophils Relative 1  0 - 5 %   Eosinophils Absolute 0.1  0.0 - 0.7 K/uL   Basophils Relative 0  0 - 1 %   Basophils Absolute 0.0  0.0 - 0.1 K/uL  COMPREHENSIVE METABOLIC PANEL      Result Value Ref Range   Sodium 138  137 - 147 mEq/L   Potassium 4.1  3.7 - 5.3 mEq/L   Chloride 100  96 - 112 mEq/L   CO2 23  19 - 32 mEq/L   Glucose, Bld 105 (*) 70 - 99 mg/dL   BUN 14  6 - 23 mg/dL    Creatinine, Ser 0.76  0.50 - 1.10 mg/dL   Calcium 9.6  8.4 - 10.5 mg/dL   Total Protein 7.5  6.0 - 8.3 g/dL   Albumin 3.7  3.5 - 5.2 g/dL   AST 61 (*) 0 - 37 U/L   ALT 54 (*) 0 - 35 U/L  Alkaline Phosphatase 61  39 - 117 U/L   Total Bilirubin 0.5  0.3 - 1.2 mg/dL   GFR calc non Af Amer >90  >90 mL/min   GFR calc Af Amer >90  >90 mL/min  LIPASE, BLOOD      Result Value Ref Range   Lipase 143 (*) 11 - 59 U/L  URINALYSIS, ROUTINE W REFLEX MICROSCOPIC      Result Value Ref Range   Color, Urine YELLOW  YELLOW   APPearance CLEAR  CLEAR   Specific Gravity, Urine 1.008  1.005 - 1.030   pH 5.0  5.0 - 8.0   Glucose, UA NEGATIVE  NEGATIVE mg/dL   Hgb urine dipstick NEGATIVE  NEGATIVE   Bilirubin Urine NEGATIVE  NEGATIVE   Ketones, ur NEGATIVE  NEGATIVE mg/dL   Protein, ur NEGATIVE  NEGATIVE mg/dL   Urobilinogen, UA 0.2  0.0 - 1.0 mg/dL   Nitrite NEGATIVE  NEGATIVE   Leukocytes, UA TRACE (*) NEGATIVE  URINE MICROSCOPIC-ADD ON      Result Value Ref Range   Squamous Epithelial / LPF FEW (*) RARE   WBC, UA 0-2  <3 WBC/hpf   Bacteria, UA RARE  RARE  I-STAT TROPOININ, ED      Result Value Ref Range   Troponin i, poc 0.01  0.00 - 0.08 ng/mL   Comment 3           I-STAT CG4 LACTIC ACID, ED      Result Value Ref Range   Lactic Acid, Venous 2.20  0.5 - 2.2 mmol/L    Date: 07/29/2013 0359  Rate: 53  Rhythm: sinus bradycardia  QRS Axis: normal  Intervals: normal  ST/T Wave abnormalities: nonspecific T wave changes  Conduction Disutrbances:none  Narrative Interpretation: Sinus bradycardia, nonspecific T wave changes. When compared with ECG of 05/04/2011, no significant changes are seen.  Old EKG Reviewed: unchanged    EKG Interpretation   Date/Time:  Wednesday Jul 29 2013 04:05:50 EDT Ventricular Rate:  54 PR Interval:  140 QRS Duration: 95 QT Interval:  469 QTC Calculation: 444 R Axis:   77 Text Interpretation:  Sinus rhythm Nonspecific T abnrm, anterolateral  leads ST  elev, probable normal early repol pattern No significant change  since last tracing Confirmed by [facility]  MD, Hettie Roselli ([id number]) on 07/29/2013  4:12:52 AM     CRITICAL CARE Performed by: Delora Fuel Total critical care time: 40 minutes Critical care time was exclusive of separately billable procedures and treating other patients. Critical care was necessary to treat or prevent imminent or life-threatening deterioration. Critical care was time spent personally by me on the following activities: development of treatment plan with patient and/or surrogate as well as nursing, discussions with consultants, evaluation of patient's response to treatment, examination of patient, obtaining history from patient or surrogate, ordering and performing treatments and interventions, ordering and review of laboratory studies, ordering and review of radiographic studies, pulse oximetry and re-evaluation of patient's condition.  MDM   Final diagnoses:  Acute pancreatitis  Elevated transaminase level  Syncope  Laceration of nose  Epistaxis  Hypotension    Abdominal pain secondary to alcoholic pancreatitis. She is noted to be hypotensive and is getting IV fluids. She does not appear septic at this point. She had received hydromorphone with some relief of pain and additional hydromorphone is being withheld pending improvement in blood pressure. Old records are reviewed and she has prior hospitalizations for pancreatitis. Mild elevation in transaminases is noted and in line with  the previous results. Lipase is not as elevated as in previous episodes and this may be related to also pancreatic tissue from prior episodes of pancreatitis.  Troponin and lactic acid, normal. Case is discussed with Dr. Shanon Brow of [facility] who agrees to admit the patient. Because of hypotension, it is elected to start her care and step down unit.  Delora Fuel, MD [id number]

## 2013-07-29 NOTE — H&P (Signed)
PCP:   Simona Huh, MD   Chief Complaint:  abd pain  HPI: 48 yo female h/o cabg 3v at age 72 yo, dm, etoh abuse, chronic pancreatitis comes in with several days of epigastric abd pain with associated n/v nonbloody.  She has not been able to eat or drink much.  No sob.  No swelling.  No cp.  No fevers.  She went to a wedding this weekend a drank some wine.  She has been vomiting since yesterday and feeling weak.  Today while she was vomiting she got up and got dizzy and passed out forward on her face, busted her nose.  Her husband made her come to ED at that time.  Review of Systems:  Positive and negative as per HPI otherwise all other systems are negative  Past Medical History: Past Medical History  Diagnosis Date  . Loss of weight   . Sarcoidosis   . Leiomyoma of uterus, unspecified   . Pure hypercholesterolemia   . Obesity, unspecified   . Chronic pancreatitis   . Coronary atherosclerosis of unspecified type of vessel, native or graft   . Type II or unspecified type diabetes mellitus without mention of complication, not stated as uncontrolled   . Hypertension   . Shortness of breath     none since stent 2 yrs ago  . Unspecified sleep apnea     no CPAP   Past Surgical History  Procedure Laterality Date  . Coronary angioplasty with stent placement  09/2009    "1"  . Coronary artery bypass graft  ~ 2000    CABG X3  . Tubal ligation  1997  . Hysteroscopy with novasure N/A 09/04/2012    Procedure: HYSTEROSCOPY WITH NOVASURE;  Surgeon: Mora Bellman, MD;  Location: [facility];  Service: Gynecology;  Laterality: N/A;  with removal intrauterine device    Medications: Prior to Admission medications   Medication Sig Start Date End Date Taking? Authorizing Provider  aspirin 325 MG tablet Take 325 mg by mouth daily.    Yes Historical Provider, MD  clopidogrel (PLAVIX) 75 MG tablet Take 75 mg by mouth daily with breakfast.    Yes Historical Provider, MD  hydrochlorothiazide  (HYDRODIURIL) 25 MG tablet Take 12.5 mg by mouth every morning. 08/15/12  Yes Oswald Hillock, MD  lisinopril (PRINIVIL,ZESTRIL) 10 MG tablet Take 10 mg by mouth daily.    Yes Historical Provider, MD  losartan (COZAAR) 50 MG tablet Take 50 mg by mouth daily.   Yes Historical Provider, MD  metFORMIN (GLUCOPHAGE) 500 MG tablet Take 500 mg by mouth daily with breakfast. Take with food   Yes Historical Provider, MD  metoprolol (LOPRESSOR) 50 MG tablet Take 50 mg by mouth daily. Verified Metoprolol Tartrate, pt only takes once daily   Yes Historical Provider, MD  pravastatin (PRAVACHOL) 20 MG tablet Take 20 mg by mouth daily.    Yes Historical Provider, MD    Allergies:  No Known Allergies  Social History:  reports that she has been smoking Cigarettes.  She has a 12.5 pack-year smoking history. She has never used smokeless tobacco. She reports that she drinks alcohol. She reports that she does not use illicit drugs.  Family History: Family History  Problem Relation Age of Onset  . Diabetes Mother   . Coronary artery disease Mother     Physical Exam: Filed Vitals:   07/29/13 0330 07/29/13 0400 07/29/13 0406 07/29/13 0430  BP: 93/65 87/59 87/59  80/66  Pulse: 74 53 54  64  Temp:      TempSrc:      Resp: 16 13 13 19   SpO2: 100% 100% 100% 100%   General appearance: alert, cooperative and no distress Head: Normocephalic, without obvious abnormality, atraumatic Eyes: negative Nose: Nares normal. Septum midline. Mucosa normal. No drainage or sinus tenderness. Neck: no JVD and supple, symmetrical, trachea midline Lungs: clear to auscultation bilaterally Heart: regular rate and rhythm, S1, S2 normal, no murmur, click, rub or gallop Abdomen: soft ttp epigastric area nd, pos bs no r/g nonacute abd Extremities: extremities normal, atraumatic, no cyanosis or edema Pulses: 2+ and symmetric Skin: Skin color, texture, turgor normal. No rashes or lesions Neurologic: Grossly normal    Labs on  Admission:   Recent Labs  07/28/13 2321  NA 138  K 4.1  CL 100  CO2 23  GLUCOSE 105*  BUN 14  CREATININE 0.76  CALCIUM 9.6    Recent Labs  07/28/13 2321  AST 61*  ALT 54*  ALKPHOS 61  BILITOT 0.5  PROT 7.5  ALBUMIN 3.7    Recent Labs  07/28/13 2321  LIPASE 143*    Recent Labs  07/28/13 2321  WBC 7.6  NEUTROABS 4.3  HGB 16.1*  HCT 47.3*  MCV 96.1  PLT 231    Radiological Exams on Admission: Dg Chest 2 View  07/27/2013   CLINICAL DATA:  Cough, smoker  EXAM: CHEST  2 VIEW  COMPARISON:  10/08/2005  FINDINGS: Cardiomediastinal silhouette is stable. The patient is status post CABG. Coronary artery stent is noted. There is streaky reticular nodular interstitial prominence bilateral infrahilar region. Mild reticular interstitial prominence bilateral upper lobe peripheral. Findings suspicious for chronic interstitial lung disease or fibrotic changes. Mild degenerative changes thoracic spine. No segmental infiltrate or pulmonary edema. Linear scarring in left base again noted.  IMPRESSION: No acute infiltrate or pulmonary edema. Mild reticular interstitial prominence bilateral upper lobe periphery. Reticular nodular interstitial prominence bilateral infrahilar region suspicious for chronic interstitial lung disease, or fibrotic changes.   Electronically Signed   By: Lahoma Crocker M.D.   On: 07/27/2013 10:59    Assessment/Plan  48 yo female with acute and chronic abd pain/pancreatitis  Principal Problem:   Acute pancreatitis- npo, conservative treatment.  Ivf.  Has been instructed to stop drinking etoh.  With associated hypotension likely due to dehydration in setting of taking multiple bp meds.  Hold all bp meds.  Admit to stepdown unit.  Active Problems:   CAD serial enzymes, ekg nsr no acute issues.   PANCREATITIS, CHRONIC   Alcohol abuse, continuous ciwa pathway   Abdominal pain   Syncope-  No focal deficits.  Likely due to orthostatics/dehydration.     Dehydration   ivf  Full code.    Rachal A Shanon Brow 07/29/2013, 4:47 AM

## 2013-07-29 NOTE — Progress Notes (Signed)
[facility] Progress Note   Marie [name] OEU:[id number] DOB: 09-30-1965 DOA: 07/29/2013 PCP: Simona Huh, MD  Brief narrative: Surena Welge is a 48 y.o. female  female h/o cabg 3v at age 37 yo, dm, etoh abuse, chronic pancreatitis comes in with several days of epigastric abd pain with associated n/v nonbloody. Found to have acute pancreatitis.   Subjective: Abdominal pain improving but still present along with nausea.   Assessment/Plan: Principal Problem:   Acute pancreatitis on chronic pancreatitis - ice chips only today- cont IVF, Zofran  Active Problems:   CAD - stable -    Alcohol abuse, continuous - watch on CIWA scale    Syncope - likely orthostatic due to Dehydration    Code Status: Full code Family Communication: none Disposition Plan: home  Consultants: none  Procedures: none  Antibiotics: Antibiotics Given (last 72 hours)   None       DVT prophylaxis: Lovenox  Objective: Filed Weights   07/29/13 0817  Weight: 67.6 kg (149 lb 0.5 oz)   Blood pressure 96/70, pulse 67, temperature 98.2 F (36.8 C), temperature source Oral, resp. rate 16, height 5' 1.81" (1.57 m), weight 67.6 kg (149 lb 0.5 oz), last menstrual period 08/15/2012, SpO2 97.00%.  Intake/Output Summary (Last 24 hours) at 07/29/13 1151 Last data filed at 07/29/13 0553  Gross per 24 hour  Intake   2000 ml  Output    225 ml  Net   1775 ml     Exam: General: No acute respiratory distress Lungs: Clear to auscultation bilaterally without wheezes or crackles Cardiovascular: Regular rate and rhythm without murmur gallop or rub normal S1 and S2 Abdomen: mild tenderness in mid and upper abdomen, nondistended, soft, bowel sounds positive, no rebound, no ascites, no appreciable mass Extremities: No significant cyanosis, clubbing, or edema bilateral lower extremities  Data Reviewed: Basic Metabolic Panel:  Recent Labs Lab 07/28/13 2321 07/29/13 0615  NA 138 143  K 4.1  3.9  CL 100 107  CO2 23 20  GLUCOSE 105* 85  BUN 14 10  CREATININE 0.76 0.61  CALCIUM 9.6 8.4   Liver Function Tests:  Recent Labs Lab 07/28/13 2321 07/29/13 0615  AST 61* 44*  ALT 54* 43*  ALKPHOS 61 48  BILITOT 0.5 0.4  PROT 7.5 6.1  ALBUMIN 3.7 3.2*    Recent Labs Lab 07/28/13 2321  LIPASE 143*   No results found for this basename: AMMONIA,  in the last 168 hours CBC:  Recent Labs Lab 07/28/13 2321 07/29/13 0615  WBC 7.6 5.0  NEUTROABS 4.3  --   HGB 16.1* 14.2  HCT 47.3* 42.1  MCV 96.1 96.6  PLT 231 188   Cardiac Enzymes:  Recent Labs Lab 07/29/13 0615  TROPONINI <0.30   BNP (last 3 results) No results found for this basename: PROBNP,  in the last 8760 hours CBG: No results found for this basename: GLUCAP,  in the last 168 hours  No results found for this or any previous visit (from the past 240 hour(s)).   Studies:  Recent x-ray studies have been reviewed in detail by the Attending Physician  Scheduled Meds:  Scheduled Meds: . aspirin  325 mg Oral Daily  . clopidogrel  75 mg Oral Q breakfast  . enoxaparin (LOVENOX) injection  40 mg Subcutaneous Q24H  . folic acid  1 mg Oral Daily  . LORazepam  0-4 mg Intravenous Q6H   Followed by  . [START ON 07/31/2013] LORazepam  0-4 mg Intravenous Q12H  .  multivitamin with minerals  1 tablet Oral Daily  . thiamine  100 mg Oral Daily   Or  . thiamine  100 mg Intravenous Daily   Continuous Infusions: . sodium chloride 150 mL/hr at 07/29/13 0600  . sodium chloride 100 mL/hr at 07/29/13 1039  . sodium chloride      Time spent on care of this patient: 35 min   Debbe Odea, MD 07/29/2013, 11:51 AM  LOS: 0 days   [facility] Office  [phone] Pager - Text Page per Shea Evans   If 7PM-7AM, please contact night-coverage [email or web address]

## 2013-07-29 NOTE — Care Management Note (Signed)
    Page 1 of 1   07/31/2013     12:26:07 PM CARE MANAGEMENT NOTE 07/31/2013  Patient:  Marie [name], Marie [name]   Account Number:  [id number]  Date Initiated:  07/29/2013  Documentation initiated by:  Tomi Bamberger  Subjective/Objective Assessment:   dx acute pancreatitis  admit- lives with spouse.     Action/Plan:   Anticipated DC Date:  07/31/2013   Anticipated DC Plan:  HOME/SELF CARE      DC Planning Services  CM consult      Choice offered to / List presented to:             Status of service:  Completed, signed off Medicare Important Message given?   (If response is "NO", the following Medicare IM given date fields will be blank) Date Medicare IM given:   Date Additional Medicare IM given:    Discharge Disposition:  HOME/SELF CARE  Per UR Regulation:  Reviewed for med. necessity/level of care/duration of stay  If discussed at [place] of Stay Meetings, dates discussed:    Comments:  07/31/13 [place], BSN [phone] patient for dc today, no NCM referral received no needs anticipated.

## 2013-07-29 NOTE — ED Notes (Signed)
Spoke to Dr. Wynelle Cleveland. Pt going to Med Surg unit. Awaiting new orders. Updated family.

## 2013-07-30 DIAGNOSIS — G4730 Sleep apnea, unspecified: Secondary | ICD-10-CM

## 2013-07-30 DIAGNOSIS — E119 Type 2 diabetes mellitus without complications: Secondary | ICD-10-CM

## 2013-07-30 LAB — GLUCOSE, CAPILLARY
GLUCOSE-CAPILLARY: 74 mg/dL (ref 70–99)
GLUCOSE-CAPILLARY: 102 mg/dL — AB (ref 70–99)
Glucose-Capillary: 146 mg/dL — ABNORMAL HIGH (ref 70–99)
Glucose-Capillary: 101 mg/dL — ABNORMAL HIGH (ref 70–99)

## 2013-07-30 LAB — LIPASE, BLOOD: Lipase: 45 U/L (ref 11–59)

## 2013-07-30 MED ORDER — BOOST / RESOURCE BREEZE PO LIQD
1.0000 | ORAL | Status: DC
  Administered 2013-07-31: 1 via ORAL

## 2013-07-30 MED ORDER — ENSURE COMPLETE PO LIQD
237.0000 mL | ORAL | Status: DC
  Administered 2013-07-30: 237 mL via ORAL

## 2013-07-30 NOTE — Progress Notes (Signed)
INITIAL NUTRITION ASSESSMENT  DOCUMENTATION CODES Per approved criteria  -Not Applicable   INTERVENTION: Provide Resource Breeze and Ensure Complete once daily each Encourage adequate PO intake, low fat foods  NUTRITION DIAGNOSIS: Predicted sub optimal energy intake related to poor appetite and acute pancreatitis as evidenced by pt's rpeort.   Goal: Pt to meet >/= 90% of their estimated nutrition needs   Monitor:  PO intake, diet advancement, weight, labs  Reason for Assessment: Malnutrition Screening Tool, score of 2  48 y.o. female  Admitting Dx: Acute pancreatitis  ASSESSMENT: 48 y.o. female female h/o cabg 31v at age 27 yo, dm, etoh abuse, chronic pancreatitis comes in with several days of epigastric abd pain with associated n/v nonbloody. Found to have acute pancreatitis.   Pt states she has had a poor appetite for the past 2 years. She states she still eats 3 to 4 small meals daily. She reports losing from 170 lbs (2 yrs ago) to current weight of 144 lbs. She reports losing an additional 3 lbs since March. Reported weight loss is slow and minimal- not concerning. Pt states she was able to consume soup and jello for lunch and she feels fine.  Encouraged daily intake of protein-rich foods. Discouraged intake of fried and high fat foods.   Nutrition Focused Physical Exam:  Subcutaneous Fat:  Orbital Region: wnl Upper Arm Region: mild wasting Thoracic and Lumbar Region: NA  Muscle:  Temple Region: wnl Clavicle Bone Region: mild wasting Clavicle and Acromion Bone Region: mild wasting Scapular Bone Region: wnl Dorsal Hand: wnl Patellar Region: wnl Anterior Thigh Region: mild wasting Posterior Calf Region: wnl  Edema: none  Height: Ht Readings from Last 1 Encounters:  07/29/13 5' 1.81" (1.57 m)    Weight: Wt Readings from Last 1 Encounters:  07/30/13 144 lb 11.2 oz (65.635 kg)    Ideal Body Weight: 110 lbs  % Ideal Body Weight: 131%  Wt Readings from Last  10 Encounters:  07/30/13 144 lb 11.2 oz (65.635 kg)  08/28/12 149 lb (67.586 kg)  08/12/12 150 lb (68.04 kg)  08/04/12 150 lb 12.8 oz (68.402 kg)  01/11/12 159 lb 6.4 oz (72.303 kg)  03/25/11 159 lb (72.122 kg)  11/09/10 156 lb 11.2 oz (71.079 kg)  04/14/10 163 lb 8 oz (74.163 kg)  03/24/10 164 lb 6.4 oz (74.571 kg)  01/12/10 168 lb 1.6 oz (76.25 kg)    Usual Body Weight: 170 lbs  % Usual Body Weight: 85%  BMI:  Body mass index is 26.63 kg/(m^2).  Estimated Nutritional Needs: Kcal: 1600-1800 Protein: 80-90 grams Fluid: 1.6-1.8 L/day  Skin: small laceration on nose  Diet Order: Full Liquid  EDUCATION NEEDS: -No education needs identified at this time   Intake/Output Summary (Last 24 hours) at 07/30/13 1538 Last data filed at 07/30/13 0916  Gross per 24 hour  Intake      0 ml  Output    900 ml  Net   -900 ml    Last BM: 5/5  Labs:   Recent Labs Lab 07/28/13 2321 07/29/13 0615  NA 138 143  K 4.1 3.9  CL 100 107  CO2 23 20  BUN 14 10  CREATININE 0.76 0.61  CALCIUM 9.6 8.4  GLUCOSE 105* 85    CBG (last 3)   Recent Labs  07/29/13 2331 07/30/13 0533 07/30/13 1205  GLUCAP 143* 74 146*    Scheduled Meds: . aspirin  325 mg Oral Daily  . clopidogrel  75 mg Oral Q  breakfast  . enoxaparin (LOVENOX) injection  40 mg Subcutaneous Q24H  . folic acid  1 mg Oral Daily  . LORazepam  0-4 mg Intravenous Q6H   Followed by  . [START ON 07/31/2013] LORazepam  0-4 mg Intravenous Q12H  . multivitamin with minerals  1 tablet Oral Daily  . thiamine  100 mg Oral Daily    Continuous Infusions: . sodium chloride 100 mL/hr at 07/30/13 0957    Past Medical History  Diagnosis Date  . Loss of weight   . Sarcoidosis   . Leiomyoma of uterus, unspecified   . Pure hypercholesterolemia   . Obesity, unspecified   . Chronic pancreatitis   . Coronary atherosclerosis of unspecified type of vessel, native or graft   . Hypertension   . Shortness of breath     none  since stent 2 yrs ago  . CHF (congestive heart failure)   . OSA (obstructive sleep apnea)     no CPAP  . Type II diabetes mellitus   . UMPNTIRW(431.5)     "weekly" (07/29/2013)    Past Surgical History  Procedure Laterality Date  . Coronary artery bypass graft  ~ 2000    CABG X3  . Tubal ligation  1997  . Hysteroscopy with novasure N/A 09/04/2012    Procedure: HYSTEROSCOPY WITH NOVASURE;  Surgeon: Mora Bellman, MD;  Location: [facility];  Service: Gynecology;  Laterality: N/A;  with removal intrauterine device  . Coronary angioplasty with stent placement  09/2009    "1"    Pryor Ochoa RD, LDN Inpatient Clinical Dietitian Pager: [phone] After Hours Pager: [phone]

## 2013-07-30 NOTE — Progress Notes (Signed)
Came to visit patient on behalf of Link to [employer] program for [employer] employees/dependents with [employer]. Explained program to patient and she states that her DM is managed well and she doesn't feel like she really needs the Link to Wellness program at this time. However, she is agreeable to post hospital discharge call. Confirmed contact information. Provided information packet and brochure for patient to look over in case she changes her mind.  Marthenia Rolling, MSN- [facility] [phone]

## 2013-07-30 NOTE — Progress Notes (Signed)
[facility] Progress Note   Marie [name] OAC:[id number] DOB: [birth year] DOA: 07/29/2013 PCP: Simona Huh, MD  Brief narrative: Marie [name] is a 48 y.o. female  female h/o cabg 3v at age 42 yo, dm, etoh abuse, chronic pancreatitis comes in with several days of epigastric abd pain with associated n/v nonbloody. Found to have acute pancreatitis.   Subjective: Abdominal pain improving - would like to advance diet  Assessment/Plan: Principal Problem:   Acute pancreatitis on chronic pancreatitis - advance to full liquids- cut back on IVF- cont PRN Zofran  Active Problems:   CAD - stable -    Alcohol abuse, continuous - watch on CIWA scale    Syncope - likely orthostatic due to Dehydration    Code Status: Full code Family Communication: none Disposition Plan: home  Consultants: none  Procedures: none  Antibiotics: Antibiotics Given (last 72 hours)   None       DVT prophylaxis: Lovenox  Objective: Filed Weights   07/29/13 0817 07/29/13 1645 07/30/13 0500  Weight: 67.6 kg (149 lb 0.5 oz) 65 kg (143 lb 4.8 oz) 65.635 kg (144 lb 11.2 oz)   Blood pressure 131/79, pulse 77, temperature 98 F (36.7 C), temperature source Oral, resp. rate 16, height 5' 1.81" (1.57 m), weight 65.635 kg (144 lb 11.2 oz), last menstrual period 08/15/2012, SpO2 94.00%.  Intake/Output Summary (Last 24 hours) at 07/30/13 1412 Last data filed at 07/30/13 0916  Gross per 24 hour  Intake      0 ml  Output    900 ml  Net   -900 ml     Exam: General: No acute respiratory distress Lungs: Clear to auscultation bilaterally without wheezes or crackles Cardiovascular: Regular rate and rhythm without murmur gallop or rub normal S1 and S2 Abdomen: mild tenderness in mid and upper abdomen, nondistended, soft, bowel sounds positive, no rebound, no ascites, no appreciable mass Extremities: No significant cyanosis, clubbing, or edema bilateral lower extremities  Data Reviewed: Basic  Metabolic Panel:  Recent Labs Lab 07/28/13 2321 07/29/13 0615  NA 138 143  K 4.1 3.9  CL 100 107  CO2 23 20  GLUCOSE 105* 85  BUN 14 10  CREATININE 0.76 0.61  CALCIUM 9.6 8.4   Liver Function Tests:  Recent Labs Lab 07/28/13 2321 07/29/13 0615  AST 61* 44*  ALT 54* 43*  ALKPHOS 61 48  BILITOT 0.5 0.4  PROT 7.5 6.1  ALBUMIN 3.7 3.2*    Recent Labs Lab 07/28/13 2321 07/30/13 0704  LIPASE 143* 45   No results found for this basename: AMMONIA,  in the last 168 hours CBC:  Recent Labs Lab 07/28/13 2321 07/29/13 0615  WBC 7.6 5.0  NEUTROABS 4.3  --   HGB 16.1* 14.2  HCT 47.3* 42.1  MCV 96.1 96.6  PLT 231 188   Cardiac Enzymes:  Recent Labs Lab 07/29/13 0615 07/29/13 1112 07/29/13 1825  TROPONINI <0.30 <0.30 <0.30   BNP (last 3 results) No results found for this basename: PROBNP,  in the last 8760 hours CBG:  Recent Labs Lab 07/29/13 1904 07/29/13 2331 07/30/13 0533 07/30/13 1205  GLUCAP 76 143* 74 146*    No results found for this or any previous visit (from the past 240 hour(s)).   Studies:  Recent x-ray studies have been reviewed in detail by the Attending Physician  Scheduled Meds:  Scheduled Meds: . aspirin  325 mg Oral Daily  . clopidogrel  75 mg Oral Q breakfast  . enoxaparin (LOVENOX) injection  40 mg Subcutaneous Q24H  . folic acid  1 mg Oral Daily  . LORazepam  0-4 mg Intravenous Q6H   Followed by  . [START ON 07/31/2013] LORazepam  0-4 mg Intravenous Q12H  . multivitamin with minerals  1 tablet Oral Daily  . thiamine  100 mg Oral Daily   Continuous Infusions: . sodium chloride 100 mL/hr at 07/30/13 0957    Time spent on care of this patient: 35 min   Debbe Odea, MD 07/30/2013, 2:12 PM  LOS: 1 day   [facility] Office  [phone] Pager - Text Page per Shea Evans   If 7PM-7AM, please contact night-coverage [email or web address]

## 2013-07-31 LAB — GLUCOSE, CAPILLARY
Glucose-Capillary: 91 mg/dL (ref 70–99)
Glucose-Capillary: 155 mg/dL — ABNORMAL HIGH (ref 70–99)

## 2013-07-31 MED ORDER — OMEPRAZOLE 40 MG PO CPDR: 40.0000 mg | DELAYED_RELEASE_CAPSULE | Freq: Every day | ORAL | Status: DC

## 2013-07-31 NOTE — Discharge Summary (Addendum)
Physician Discharge Summary  Marie [name] BTD:[id number] DOB: [birth year] DOA: 07/29/2013  PCP: Simona Huh, MD  Admit date: 07/29/2013 Discharge date: 07/31/2013  Time spent: >45 minutes  Recommendations for Outpatient Follow-up:  F/u with PCP in 1-2 wks  Discharge Diagnoses:  Principal Problem:   Acute pancreatitis Active Problems:   CAD   PANCREATITIS, CHRONIC   Alcohol abuse, continuous   Abdominal pain   Syncope   Dehydration   Discharge Condition: stable  Diet recommendation: heart healthy  Filed Weights   07/29/13 1645 07/30/13 0500 07/31/13 0553  Weight: 65 kg (143 lb 4.8 oz) 65.635 kg (144 lb 11.2 oz) 67.45 kg (148 lb 11.2 oz)    History of present illness:  Marie [name] is a 48 y.o. female female h/o cabg 3v at age 86 yo, dm, etoh abuse, chronic pancreatitis comes in with several days of epigastric abd pain with associated n/v nonbloody. Found to have acute pancreatitis.   Hospital Course:  Principal Problem:  Acute pancreatitis on chronic pancreatitis  -likely due to continued ETOH abuse now resolved- advised her to stop drinking alcohol  Active Problems:   Syncope  - likely orthostatic due to Dehydration  Continued mild epigastric pain - suspect she may have alcoholic gastritis - will place her on a PPI  CAD with stenting s/p CABG - stable   Alcohol abuse, continuous  - watched on CIWA scale - no signs of withdrawal noted     Procedures:  none  Consultations:  none  Discharge Exam: Filed Vitals:   07/31/13 0553  BP: 134/80  Pulse: 75  Temp: 98 F (36.7 C)  Resp: 18    General: AAO x3, no distress, lacerations on face from fall healing well Cardiovascular: RRR, no murmurs Respiratory: CTA b/l  Abdomen: non-tender, non-distended, BS+  Discharge Instructions You were cared for by a hospitalist during your hospital stay. If you have any questions about your discharge medications or the care you received while you were in the  hospital after you are discharged, you can call the unit and asked to speak with the hospitalist on call if the hospitalist that took care of you is not available. Once you are discharged, your primary care physician will handle any further medical issues. Please note that NO REFILLS for any discharge medications will be authorized once you are discharged, as it is imperative that you return to your primary care physician (or establish a relationship with a primary care physician if you do not have one) for your aftercare needs so that they can reassess your need for medications and monitor your lab values.   Future Appointments Provider Department Dept Phone   08/13/2013 9:00 AM [facility] [phone]   Please do not eat or drink anything after midnight or at least 6-8 hours prior to your scheduled exam. Any medications can be taken as usual with minimal water. Please arrive 15 min prior to your appointment time.   08/13/2013 2:15 PM Collene Gobble, MD [facility] [phone]       Medication List    ASK your doctor about these medications       aspirin 325 MG tablet  Take 325 mg by mouth daily.     clopidogrel 75 MG tablet  Commonly known as:  PLAVIX  Take 75 mg by mouth daily with breakfast.     hydrochlorothiazide 25 MG tablet  Commonly known as:  HYDRODIURIL  Take 12.5 mg by mouth every morning.  lisinopril 10 MG tablet  Commonly known as:  PRINIVIL,ZESTRIL  Take 10 mg by mouth daily.     losartan 50 MG tablet  Commonly known as:  COZAAR  Take 50 mg by mouth daily.     metFORMIN 500 MG tablet  Commonly known as:  GLUCOPHAGE  Take 500 mg by mouth daily with breakfast. Take with food     metoprolol 50 MG tablet  Commonly known as:  LOPRESSOR  Take 50 mg by mouth daily. Verified Metoprolol Tartrate, pt only takes once daily     pravastatin 20 MG tablet  Commonly known as:  PRAVACHOL  Take 20 mg by mouth daily.        No Known Allergies    The results of significant diagnostics from this hospitalization (including imaging, microbiology, ancillary and laboratory) are listed below for reference.    Significant Diagnostic Studies: Dg Chest 2 View  07/27/2013   CLINICAL DATA:  Cough, smoker  EXAM: CHEST  2 VIEW  COMPARISON:  10/08/2005  FINDINGS: Cardiomediastinal silhouette is stable. The patient is status post CABG. Coronary artery stent is noted. There is streaky reticular nodular interstitial prominence bilateral infrahilar region. Mild reticular interstitial prominence bilateral upper lobe peripheral. Findings suspicious for chronic interstitial lung disease or fibrotic changes. Mild degenerative changes thoracic spine. No segmental infiltrate or pulmonary edema. Linear scarring in left base again noted.  IMPRESSION: No acute infiltrate or pulmonary edema. Mild reticular interstitial prominence bilateral upper lobe periphery. Reticular nodular interstitial prominence bilateral infrahilar region suspicious for chronic interstitial lung disease, or fibrotic changes.   Electronically Signed   By: Lahoma Crocker M.D.   On: 07/27/2013 10:59    Microbiology: No results found for this or any previous visit (from the past 240 hour(s)).   Labs: Basic Metabolic Panel:  Recent Labs Lab 07/28/13 2321 07/29/13 0615  NA 138 143  K 4.1 3.9  CL 100 107  CO2 23 20  GLUCOSE 105* 85  BUN 14 10  CREATININE 0.76 0.61  CALCIUM 9.6 8.4   Liver Function Tests:  Recent Labs Lab 07/28/13 2321 07/29/13 0615  AST 61* 44*  ALT 54* 43*  ALKPHOS 61 48  BILITOT 0.5 0.4  PROT 7.5 6.1  ALBUMIN 3.7 3.2*    Recent Labs Lab 07/28/13 2321 07/30/13 0704  LIPASE 143* 45   No results found for this basename: AMMONIA,  in the last 168 hours CBC:  Recent Labs Lab 07/28/13 2321 07/29/13 0615  WBC 7.6 5.0  NEUTROABS 4.3  --   HGB 16.1* 14.2  HCT 47.3* 42.1  MCV 96.1 96.6  PLT 231 188   Cardiac  Enzymes:  Recent Labs Lab 07/29/13 0615 07/29/13 1112 07/29/13 1825  TROPONINI <0.30 <0.30 <0.30   BNP: BNP (last 3 results) No results found for this basename: PROBNP,  in the last 8760 hours CBG:  Recent Labs Lab 07/30/13 0533 07/30/13 1205 07/30/13 1804 07/30/13 2329 07/31/13 0549  GLUCAP 74 146* 101* 102* 91       Signed:  Debbe Odea, MD  [facility] 07/31/2013, 9:30 AM

## 2013-07-31 NOTE — Progress Notes (Signed)
Patient discharge teaching given, including activity, diet, follow-up appoints, and medications. Patient verbalized understanding of all discharge instructions. IV access was d/c'd. Vitals are stable. Skin is intact except as charted in most recent assessments. Pt to be escorted out by NT, to be driven home by family. 

## 2013-08-13 ENCOUNTER — Ambulatory Visit (INDEPENDENT_AMBULATORY_CARE_PROVIDER_SITE_OTHER): Payer: 59 | Admitting: Emergency Medicine

## 2013-08-13 ENCOUNTER — Encounter: Payer: Self-pay | Admitting: Emergency Medicine

## 2013-08-13 ENCOUNTER — Ambulatory Visit (INDEPENDENT_AMBULATORY_CARE_PROVIDER_SITE_OTHER)
Admission: RE | Admit: 2013-08-13 | Discharge: 2013-08-13 | Disposition: A | Discharge status: Home / Self Care | Payer: 59 | Source: Ambulatory Visit | Attending: Emergency Medicine | Admitting: Emergency Medicine

## 2013-08-13 ENCOUNTER — Ambulatory Visit (HOSPITAL_COMMUNITY)
Admission: RE | Admit: 2013-08-13 | Discharge: 2013-08-13 | Disposition: A | Discharge status: Home / Self Care | Payer: 59 | Source: Ambulatory Visit | Attending: Family Medicine | Admitting: Family Medicine

## 2013-08-13 VITALS — BP 120/74 | HR 69 | Ht 62.0 in | Wt 140.6 lb

## 2013-08-13 DIAGNOSIS — J449 Chronic obstructive pulmonary disease, unspecified: Secondary | ICD-10-CM

## 2013-08-13 DIAGNOSIS — K861 Other chronic pancreatitis: Secondary | ICD-10-CM | POA: Insufficient documentation

## 2013-08-13 DIAGNOSIS — D869 Sarcoidosis, unspecified: Secondary | ICD-10-CM

## 2013-08-13 DIAGNOSIS — R0602 Shortness of breath: Secondary | ICD-10-CM

## 2013-08-13 DIAGNOSIS — G4730 Sleep apnea, unspecified: Secondary | ICD-10-CM

## 2013-08-13 DIAGNOSIS — J4489 Other specified chronic obstructive pulmonary disease: Secondary | ICD-10-CM

## 2013-08-13 DIAGNOSIS — F17200 Nicotine dependence, unspecified, uncomplicated: Secondary | ICD-10-CM

## 2013-08-13 DIAGNOSIS — R748 Abnormal levels of other serum enzymes: Secondary | ICD-10-CM

## 2013-08-13 DIAGNOSIS — R0902 Hypoxemia: Secondary | ICD-10-CM

## 2013-08-13 DIAGNOSIS — G4733 Obstructive sleep apnea (adult) (pediatric): Secondary | ICD-10-CM

## 2013-08-13 MED ORDER — ALBUTEROL SULFATE HFA 108 (90 BASE) MCG/ACT IN AERS: 2.0000 | INHALATION_SPRAY | Freq: Four times a day (QID) | RESPIRATORY_TRACT | Status: DC | PRN

## 2013-08-13 NOTE — Progress Notes (Signed)
Subjective:    Patient ID: Marie [name], female    DOB: 1966/03/04, 48 y.o.   MRN: [id number]  HPI 48 yo obese smoker (15pk-yrs) with hx CAD / CABG at age 50, EtOH abuse and pancreatitis, OSA (not on CPAP), carries a hx of sarcoidosis made in 2007 by mediastinal nodal bx (Dr Arlyce Dice). She is referred today by Dr Marisue Humble for a nodular interstitial pattern on CXR in setting of cough, wt loss of 15-20 lbs over a year. She describes some dyspnea with exertion, occasionally even laying still.  She desaturated on ambulation today. Her husband notes that she sometimes responds slowly when he speaks to her.   PFT 06/06/10 > moderately severe AFL without BD response, normal volumes, decreased diffusion    Review of Systems  Constitutional: Positive for appetite change and unexpected weight change. Negative for fever.  HENT: Positive for trouble swallowing. Negative for congestion, dental problem, ear pain, nosebleeds, postnasal drip, rhinorrhea, sinus pressure, sneezing and sore throat.   Eyes: Negative for redness and itching.  Respiratory: Positive for cough, chest tightness and shortness of breath. Negative for wheezing.   Cardiovascular: Positive for chest pain and leg swelling. Negative for palpitations.       Hand and feet   Gastrointestinal: Negative for nausea and vomiting.  Genitourinary: Negative for dysuria.  Musculoskeletal: Negative for joint swelling.  Skin: Negative for rash.  Neurological: Positive for headaches.  Hematological: Does not bruise/bleed easily.  Psychiatric/Behavioral: Negative for dysphoric mood. The patient is not nervous/anxious.     Past Medical History  Diagnosis Date  . Loss of weight   . Sarcoidosis   . Leiomyoma of uterus, unspecified   . Pure hypercholesterolemia   . Obesity, unspecified   . Chronic pancreatitis   . Coronary atherosclerosis of unspecified type of vessel, native or graft   . Hypertension   . Shortness of breath     none since  stent 2 yrs ago  . CHF (congestive heart failure)   . OSA (obstructive sleep apnea)     no CPAP  . Type II diabetes mellitus   . SEGBTDVV(616.0)     "weekly" (07/29/2013)     Family History  Problem Relation Age of Onset  . Diabetes Mother   . Coronary artery disease Mother      History   Social History  . Marital Status: Married    Spouse Name: N/A    Number of Children: N/A  . Years of Education: N/A   Occupational History  . Accountant    Social History Main Topics  . Smoking status: Current Every Day Smoker -- 0.50 packs/day for 28 years    Types: Cigarettes  . Smokeless tobacco: Never Used  . Alcohol Use: 16.8 oz/week    28 Glasses of wine per week     Comment: 07/29/2013 "3-4 glasses of wine qd"  . Drug Use: No  . Sexual Activity: Yes    Birth Control/ Protection: IUD   Other Topics Concern  . Not on file   Social History Narrative  . No narrative on file     No Known Allergies   Outpatient Prescriptions Prior to Visit  Medication Sig Dispense Refill  . aspirin 325 MG tablet Take 325 mg by mouth daily.       . clopidogrel (PLAVIX) 75 MG tablet Take 75 mg by mouth daily with breakfast.       . hydrochlorothiazide (HYDRODIURIL) 25 MG tablet Take 25 mg by mouth every morning.       Marland Kitchen  lisinopril (PRINIVIL,ZESTRIL) 10 MG tablet Take 10 mg by mouth daily.       . metFORMIN (GLUCOPHAGE) 500 MG tablet Take 500 mg by mouth daily with breakfast. Take with food      . metoprolol (LOPRESSOR) 50 MG tablet Take 50 mg by mouth daily. Verified Metoprolol Tartrate, pt only takes once daily      . omeprazole (PRILOSEC) 40 MG capsule Take 1 capsule (40 mg total) by mouth daily.  30 capsule  6  . pravastatin (PRAVACHOL) 20 MG tablet Take 20 mg by mouth daily.        No facility-administered medications prior to visit.         Objective:   Physical Exam Filed Vitals:   08/13/13 1412  BP: 120/74  Pulse: 69  Height: 5\' 2"  (1.575 m)  Weight: 140 lb 9.6 oz (63.776 kg)    SpO2: 97%   Gen: Pleasant, well-nourished, in no distress,  normal affect  ENT: No lesions,  mouth clear,  oropharynx clear, no postnasal drip  Neck: No JVD, no TMG, no carotid bruits, mediastinoscopy scar  Lungs: No use of accessory muscles, small breaths clear without rales or rhonchi, sternotomy scar  Cardiovascular: RRR, heart sounds normal, no murmur or gallops, no peripheral edema  Musculoskeletal: No deformities, no cyanosis or clubbing  Neuro: alert, non focal  Skin: Warm, no lesions or rashes    03/23/10 --  Comparison: CT abdomen 08/16/2009  Findings: No evidence of axillary or supraclavicular  lymphadenopathy.  Within the mediastinum there is symmetric paratracheal, subcarinal  and hilar lymphadenopathy. Some of these lymph nodes has central  calcifications. Exemplary node includes 12 mm right lower  paratracheal lymph node and 17 mm short axis subcarinal lymph node.  Bilateral hilar small confluent nodes. No pericardial fluid or  thickening.  Review lung parenchyma demonstrates stable pleural parenchymal  nodular thickening at the right lung base measuring at 10 x 9 mm  compared to 10 x 10 mm on prior. There is a mild reticular pattern  within the right upper lobe with some interlobular septal  thickening. Potential subpleural nodule along the superior right  oblique fissure (image 21). There is similar fine reticular  pattern within the left upper lobe. Airways are normal.  The limited view of the upper abdomen is unremarkable. Limited  view of the skeleton is unremarkable other than a midline  sternotomy.  IMPRESSION:  1. Bilateral mediastinal and hilar lymphadenopathy suggests  granulomatous disease such as sarcoidosis. A lymphoproliferative  disorder or mediastinal metastasis are considered much less likely.  2. Mild reticular pattern within the upper lobes as well as  several subpleural nodules is suggestive of pulmonary sarcoidosis.  3. Stable nodular  pleural parenchymal thickening at the left lung  base is likely related to the same process.     Assessment & Plan:  TOBACCO ABUSE Discussed cessation today. We will continue to work on this as we move forward  [place] Needs a repeat PSG and CPAP titration. She is willing to do this.   SARCOIDOSIS, PULMONARY Dx by mediastinoscopy in 2007. Her CT chest from 2011 showed a reticular-nodular pattern + lymphadenopathy. She is unsure whether she has ever been treated for a flare. Her CXR looks fairly stable.  - repeat CT chest toi compare with 2011 and assess for progression  COPD (chronic obstructive pulmonary disease) Presumed  COPD based on her tobacco hx and her PFT from 2012.  - will start SABA prn for now, will add  long-acting BD's later if indicated  Hypoxemia Noted on ambulation today. Her DLCO on PFT from 2012 was low, possibly out of proportion to her interstitial changes on CT scan. ? Whether she may have a contribution of pulmonary vascular disease. Will consider this after review of the new CT chest. If no significant ILD to explain hypoxemia then will consider TTE, VTE eval .

## 2013-08-13 NOTE — Assessment & Plan Note (Signed)
Noted on ambulation today. Her DLCO on PFT from 2012 was low, possibly out of proportion to her interstitial changes on CT scan. ? Whether she may have a contribution of pulmonary vascular disease. Will consider this after review of the new CT chest. If no significant ILD to explain hypoxemia then will consider TTE, VTE eval .

## 2013-08-13 NOTE — Patient Instructions (Signed)
We will perform a CT scan of your chest to compare with 2011 We will repeat your sleep study and work on starting CPAP Your walking oximetry test today showed that your oxygen is low and that you should wear oxygen with exertion. We will arrange for you to have oxygen to use WHENEVER you walk or exert We will teach you how to use albuterol, 2 puffs up to every 4 hours if needed for shortness of breath.  Follow with Dr Lamonte Sakai in 2 months or sooner if you have any problems.

## 2013-08-13 NOTE — Assessment & Plan Note (Signed)
Discussed cessation today. We will continue to work on this as we move forward

## 2013-08-13 NOTE — Assessment & Plan Note (Signed)
Needs a repeat PSG and CPAP titration. She is willing to do this.

## 2013-08-13 NOTE — Assessment & Plan Note (Signed)
Dx by mediastinoscopy in 2007. Her CT chest from 2011 showed a reticular-nodular pattern + lymphadenopathy. She is unsure whether she has ever been treated for a flare. Her CXR looks fairly stable.  - repeat CT chest toi compare with 2011 and assess for progression

## 2013-08-13 NOTE — Assessment & Plan Note (Signed)
Presumed  COPD based on her tobacco hx and her PFT from 2012.  - will start SABA prn for now, will add long-acting BD's later if indicated

## 2013-08-19 ENCOUNTER — Telehealth: Payer: Self-pay | Admitting: Critical Care Medicine

## 2013-08-19 NOTE — Telephone Encounter (Signed)
Advised pt through MyChart message that I would ask RB to address these today.  RB - please advise on CT that was done on 08/13/13. Thanks!

## 2013-08-19 NOTE — Telephone Encounter (Signed)
lmtcb x1 

## 2013-08-19 NOTE — Telephone Encounter (Signed)
Please let pt know that her CT scan is unchanged compared with 2011 - there are similar changes consistent with her sarcoidosis. Thanks

## 2013-08-20 NOTE — Telephone Encounter (Signed)
Results have been explained to patient, pt expressed understanding. Nothing further needed.  

## 2013-08-20 NOTE — Telephone Encounter (Signed)
LMTCBx2. Jennifer Castillo, CMA  

## 2013-08-31 ENCOUNTER — Ambulatory Visit (HOSPITAL_BASED_OUTPATIENT_CLINIC_OR_DEPARTMENT_OTHER): Payer: 59

## 2013-09-15 ENCOUNTER — Ambulatory Visit (HOSPITAL_BASED_OUTPATIENT_CLINIC_OR_DEPARTMENT_OTHER): Payer: 59 | Attending: Emergency Medicine

## 2013-10-16 ENCOUNTER — Ambulatory Visit: Payer: 59 | Admitting: Emergency Medicine

## 2014-01-28 ENCOUNTER — Ambulatory Visit (INDEPENDENT_AMBULATORY_CARE_PROVIDER_SITE_OTHER): Payer: 59 | Admitting: Obstetrics & Gynecology

## 2014-01-28 ENCOUNTER — Encounter: Payer: Self-pay | Admitting: Obstetrics & Gynecology

## 2014-01-28 ENCOUNTER — Other Ambulatory Visit: Payer: Self-pay | Admitting: Obstetrics & Gynecology

## 2014-01-28 VITALS — BP 131/76 | HR 86 | Temp 98.1°F | Ht 62.0 in | Wt 146.1 lb

## 2014-01-28 DIAGNOSIS — Z1231 Encounter for screening mammogram for malignant neoplasm of breast: Secondary | ICD-10-CM

## 2014-01-28 DIAGNOSIS — Z01419 Encounter for gynecological examination (general) (routine) without abnormal findings: Secondary | ICD-10-CM

## 2014-01-28 NOTE — Patient Instructions (Signed)
Smoking Cessation Quitting smoking is important to your health and has many advantages. However, it is not always easy to quit since nicotine is a very addictive drug. Oftentimes, people try 3 times or more before being able to quit. This document explains the best ways for you to prepare to quit smoking. Quitting takes hard work and a lot of effort, but you can do it. ADVANTAGES OF QUITTING SMOKING  You will live longer, feel better, and live better.  Your body will feel the impact of quitting smoking almost immediately.  Within 20 minutes, blood pressure decreases. Your pulse returns to its normal level.  After 8 hours, carbon monoxide levels in the blood return to normal. Your oxygen level increases.  After 24 hours, the chance of having a heart attack starts to decrease. Your breath, hair, and body stop smelling like smoke.  After 48 hours, damaged nerve endings begin to recover. Your sense of taste and smell improve.  After 72 hours, the body is virtually free of nicotine. Your bronchial tubes relax and breathing becomes easier.  After 2 to 12 weeks, lungs can hold more air. Exercise becomes easier and circulation improves.  The risk of having a heart attack, stroke, cancer, or lung disease is greatly reduced.  After 1 year, the risk of coronary heart disease is cut in half.  After 5 years, the risk of stroke falls to the same as a nonsmoker.  After 10 years, the risk of lung cancer is cut in half and the risk of other cancers decreases significantly.  After 15 years, the risk of coronary heart disease drops, usually to the level of a nonsmoker.  If you are pregnant, quitting smoking will improve your chances of having a healthy baby.  The people you live with, especially any children, will be healthier.  You will have extra money to spend on things other than cigarettes. QUESTIONS TO THINK ABOUT BEFORE ATTEMPTING TO QUIT You may want to talk about your answers with your  health care provider.  Why do you want to quit?  If you tried to quit in the past, what helped and what did not?  What will be the most difficult situations for you after you quit? How will you plan to handle them?  Who can help you through the tough times? Your family? Friends? A health care provider?  What pleasures do you get from smoking? What ways can you still get pleasure if you quit? Here are some questions to ask your health care provider:  How can you help me to be successful at quitting?  What medicine do you think would be best for me and how should I take it?  What should I do if I need more help?  What is smoking withdrawal like? How can I get information on withdrawal? GET READY  Set a quit date.  Change your environment by getting rid of all cigarettes, ashtrays, matches, and lighters in your home, car, or work. Do not let people smoke in your home.  Review your past attempts to quit. Think about what worked and what did not. GET SUPPORT AND ENCOURAGEMENT You have a better chance of being successful if you have help. You can get support in many ways.  Tell your family, friends, and coworkers that you are going to quit and need their support. Ask them not to smoke around you.  Get individual, group, or telephone counseling and support. Programs are available at local hospitals and health centers. Call   your local health department for information about programs in your area.  Spiritual beliefs and practices may help some smokers quit.  Download a "quit meter" on your computer to keep track of quit statistics, such as how long you have gone without smoking, cigarettes not smoked, and money saved.  Get a self-help book about quitting smoking and staying off tobacco. LEARN NEW SKILLS AND BEHAVIORS  Distract yourself from urges to smoke. Talk to someone, go for a walk, or occupy your time with a task.  Change your normal routine. Take a different route to work.  Drink tea instead of coffee. Eat breakfast in a different place.  Reduce your stress. Take a hot bath, exercise, or read a book.  Plan something enjoyable to do every day. Reward yourself for not smoking.  Explore interactive web-based programs that specialize in helping you quit. GET MEDICINE AND USE IT CORRECTLY Medicines can help you stop smoking and decrease the urge to smoke. Combining medicine with the above behavioral methods and support can greatly increase your chances of successfully quitting smoking.  Nicotine replacement therapy helps deliver nicotine to your body without the negative effects and risks of smoking. Nicotine replacement therapy includes nicotine gum, lozenges, inhalers, nasal sprays, and skin patches. Some may be available over-the-counter and others require a prescription.  Antidepressant medicine helps people abstain from smoking, but how this works is unknown. This medicine is available by prescription.  Nicotinic receptor partial agonist medicine simulates the effect of nicotine in your brain. This medicine is available by prescription. Ask your health care provider for advice about which medicines to use and how to use them based on your health history. Your health care provider will tell you what side effects to look out for if you choose to be on a medicine or therapy. Carefully read the information on the package. Do not use any other product containing nicotine while using a nicotine replacement product.  RELAPSE OR DIFFICULT SITUATIONS Most relapses occur within the first 3 months after quitting. Do not be discouraged if you start smoking again. Remember, most people try several times before finally quitting. You may have symptoms of withdrawal because your body is used to nicotine. You may crave cigarettes, be irritable, feel very hungry, cough often, get headaches, or have difficulty concentrating. The withdrawal symptoms are only temporary. They are strongest  when you first quit, but they will go away within 10-14 days. To reduce the chances of relapse, try to:  Avoid drinking alcohol. Drinking lowers your chances of successfully quitting.  Reduce the amount of caffeine you consume. Once you quit smoking, the amount of caffeine in your body increases and can give you symptoms, such as a rapid heartbeat, sweating, and anxiety.  Avoid smokers because they can make you want to smoke.  Do not let weight gain distract you. Many smokers will gain weight when they quit, usually less than 10 pounds. Eat a healthy diet and stay active. You can always lose the weight gained after you quit.  Find ways to improve your mood other than smoking. FOR MORE INFORMATION  www.smokefree.gov  Document Released: 03/06/2001 Document Revised: 07/27/2013 Document Reviewed: 06/21/2011 [employer] Patient Information 2015 [employer]. This information is not intended to replace advice given to you by your health care provider. Make sure you discuss any questions you have with your health care provider.  

## 2014-01-28 NOTE — Progress Notes (Signed)
Patient ID: Marie [name], female   DOB: 06/29/1965, 48 y.o.   MRN: [id number] Subjective:     Marie [name] is a 48 y.o. female here for a routine exam.  Current complaints: no GYN complaints.  LMP 2 years prev.  Pt s/p endometrial ablation.   Gynecologic History Patient's last menstrual period was 08/15/2012. Contraception: tubal ligation Last Pap: 2010-  Results were: normal Last mammogram: 03/2012. Results were: normal  Obstetric History OB History  Gravida Para Term Preterm AB SAB TAB Ectopic Multiple Living  3 3 3       3     # Outcome Date GA Lbr Len/2nd Weight Sex Delivery Anes PTL Lv  3 Term           2 Term           1 Term                The following portions of the patient's history were reviewed and updated as appropriate: allergies, current medications, past family history, past medical history, past social history, past surgical history and problem list.  Review of Systems A comprehensive review of systems was negative.    Objective:    BP 131/76 mmHg  Pulse 86  Temp(Src) 98.1 F (36.7 C) (Oral)  Ht 5\' 2"  (1.575 m)  Wt 146 lb 1.6 oz (66.271 kg)  BMI 26.72 kg/m2  LMP 08/15/2012  General Appearance:    Alert, cooperative, no distress, appears stated age              Throat:   Lips, mucosa, and tongue normal; teeth and gums normal  Neck:   Supple, symmetrical, trachea midline, no adenopathy;    thyroid:  no enlargement/tenderness/nodules; no carotid   bruit or JVD  Back:     Symmetric, no curvature, ROM normal, no CVA tenderness  Lungs:     Clear to auscultation bilaterally, respirations unlabored  Chest Wall:    No tenderness or deformity   Heart:    Regular rate and rhythm, S1 and S2 normal, no murmur, rub   or gallop  Breast Exam:    No tenderness, masses, or nipple abnormality  Abdomen:     Soft, non-tender, bowel sounds active all four quadrants,    no masses, no organomegaly  Genitalia:    Normal female without lesion, discharge or tenderness;  ext hemorrhoids NON-thrombosed noted     Extremities:   Extremities normal, atraumatic, no cyanosis or edema  Pulses:   2+ and symmetric all extremities  Skin:   Skin color, texture, turgor normal, no rashes or lesions            Assessment:    Healthy female exam.   tob use    Plan:    Follow up in: 1 year.    F/u PAP in 3 years IF PAP and HPV neg Mammogram Rec tobacco cessation

## 2014-01-29 LAB — CYTOLOGY - PAP

## 2014-02-24 ENCOUNTER — Ambulatory Visit
Admission: RE | Admit: 2014-02-24 | Discharge: 2014-02-24 | Disposition: A | Discharge status: Home / Self Care | Payer: 59 | Source: Ambulatory Visit | Attending: Obstetrics & Gynecology | Admitting: Obstetrics & Gynecology

## 2014-02-24 DIAGNOSIS — Z1231 Encounter for screening mammogram for malignant neoplasm of breast: Secondary | ICD-10-CM

## 2014-02-25 ENCOUNTER — Other Ambulatory Visit: Payer: Self-pay | Admitting: Obstetrics & Gynecology

## 2014-02-25 DIAGNOSIS — R928 Other abnormal and inconclusive findings on diagnostic imaging of breast: Secondary | ICD-10-CM

## 2014-03-01 ENCOUNTER — Telehealth: Payer: Self-pay

## 2014-03-01 NOTE — Telephone Encounter (Signed)
-----   Message from Lavonia Drafts, MD sent at 02/26/2014  8:57 AM EST ----- Please call pt.  She needs f/u studies of her breast mammogram.  Pls confirm that she is aware and is getting that appt scheduled.   Thx, clh-S

## 2014-03-01 NOTE — Telephone Encounter (Signed)
Attempted to contact patient. No answer. Left message stating we are trying to reach you with results, please call clinic.

## 2014-03-02 NOTE — Telephone Encounter (Signed)
Patient called back and left message stating she is returning our call. Called patient back and asked if she had any appts scheduled to follow up from her mammogram. Patient states yes and that she is scheduled with the breast center on 12/14 @ 2pm. Patient had no other questions

## 2014-03-02 NOTE — Telephone Encounter (Signed)
Called patient, no answer- left message that we are trying to reach you, please call us back at the clinics

## 2014-03-08 ENCOUNTER — Other Ambulatory Visit: Payer: 59

## 2014-03-17 ENCOUNTER — Other Ambulatory Visit: Payer: 59

## 2014-03-21 ENCOUNTER — Other Ambulatory Visit: Payer: Self-pay

## 2014-03-21 ENCOUNTER — Encounter (HOSPITAL_COMMUNITY): Payer: Self-pay | Admitting: *Deleted

## 2014-03-21 ENCOUNTER — Emergency Department (HOSPITAL_COMMUNITY)
Admission: EM | Admit: 2014-03-21 | Discharge: 2014-03-22 | Disposition: A | Discharge status: Home / Self Care | Payer: 59 | Attending: Emergency Medicine | Admitting: Emergency Medicine

## 2014-03-21 DIAGNOSIS — I509 Heart failure, unspecified: Secondary | ICD-10-CM | POA: Diagnosis not present

## 2014-03-21 DIAGNOSIS — I10 Essential (primary) hypertension: Secondary | ICD-10-CM | POA: Diagnosis not present

## 2014-03-21 DIAGNOSIS — E669 Obesity, unspecified: Secondary | ICD-10-CM | POA: Diagnosis not present

## 2014-03-21 DIAGNOSIS — R1013 Epigastric pain: Secondary | ICD-10-CM | POA: Insufficient documentation

## 2014-03-21 DIAGNOSIS — Z7982 Long term (current) use of aspirin: Secondary | ICD-10-CM | POA: Diagnosis not present

## 2014-03-21 DIAGNOSIS — Z8742 Personal history of other diseases of the female genital tract: Secondary | ICD-10-CM | POA: Diagnosis not present

## 2014-03-21 DIAGNOSIS — E119 Type 2 diabetes mellitus without complications: Secondary | ICD-10-CM | POA: Diagnosis not present

## 2014-03-21 DIAGNOSIS — Z951 Presence of aortocoronary bypass graft: Secondary | ICD-10-CM | POA: Insufficient documentation

## 2014-03-21 DIAGNOSIS — Z79899 Other long term (current) drug therapy: Secondary | ICD-10-CM | POA: Insufficient documentation

## 2014-03-21 DIAGNOSIS — R079 Chest pain, unspecified: Secondary | ICD-10-CM | POA: Diagnosis present

## 2014-03-21 DIAGNOSIS — I2510 Atherosclerotic heart disease of native coronary artery without angina pectoris: Secondary | ICD-10-CM | POA: Insufficient documentation

## 2014-03-21 DIAGNOSIS — F1010 Alcohol abuse, uncomplicated: Secondary | ICD-10-CM | POA: Insufficient documentation

## 2014-03-21 DIAGNOSIS — Z7902 Long term (current) use of antithrombotics/antiplatelets: Secondary | ICD-10-CM | POA: Insufficient documentation

## 2014-03-21 DIAGNOSIS — Z8669 Personal history of other diseases of the nervous system and sense organs: Secondary | ICD-10-CM | POA: Insufficient documentation

## 2014-03-21 DIAGNOSIS — R0789 Other chest pain: Secondary | ICD-10-CM | POA: Diagnosis not present

## 2014-03-21 DIAGNOSIS — Z9851 Tubal ligation status: Secondary | ICD-10-CM | POA: Insufficient documentation

## 2014-03-21 DIAGNOSIS — Z720 Tobacco use: Secondary | ICD-10-CM | POA: Diagnosis not present

## 2014-03-21 DIAGNOSIS — Z9861 Coronary angioplasty status: Secondary | ICD-10-CM | POA: Diagnosis not present

## 2014-03-21 LAB — CBC WITH DIFFERENTIAL/PLATELET
Basophils Absolute: 0 10*3/uL (ref 0.0–0.1)
Basophils Relative: 0 % (ref 0–1)
Eosinophils Absolute: 0 10*3/uL (ref 0.0–0.7)
Eosinophils Relative: 0 % (ref 0–5)
HEMATOCRIT: 45.2 % (ref 36.0–46.0)
HEMOGLOBIN: 15.2 g/dL — AB (ref 12.0–15.0)
LYMPHS ABS: 1.8 10*3/uL (ref 0.7–4.0)
Lymphocytes Relative: 26 % (ref 12–46)
MCH: 31.1 pg (ref 26.0–34.0)
MCHC: 33.6 g/dL (ref 30.0–36.0)
MCV: 92.6 fL (ref 78.0–100.0)
MONO ABS: 0.6 10*3/uL (ref 0.1–1.0)
Monocytes Relative: 10 % (ref 3–12)
NEUTROS PCT: 64 % (ref 43–77)
Neutro Abs: 4.2 10*3/uL (ref 1.7–7.7)
Platelets: 242 10*3/uL (ref 150–400)
RBC: 4.88 MIL/uL (ref 3.87–5.11)
RDW: 12.4 % (ref 11.5–15.5)
WBC: 6.7 10*3/uL (ref 4.0–10.5)

## 2014-03-21 LAB — URINALYSIS, ROUTINE W REFLEX MICROSCOPIC
Bilirubin Urine: NEGATIVE
GLUCOSE, UA: NEGATIVE mg/dL
Hgb urine dipstick: NEGATIVE
KETONES UR: NEGATIVE mg/dL
LEUKOCYTES UA: NEGATIVE
NITRITE: NEGATIVE
PH: 5 (ref 5.0–8.0)
Protein, ur: NEGATIVE mg/dL
SPECIFIC GRAVITY, URINE: 1.004 — AB (ref 1.005–1.030)
Urobilinogen, UA: 0.2 mg/dL (ref 0.0–1.0)

## 2014-03-21 LAB — COMPREHENSIVE METABOLIC PANEL
ALK PHOS: 55 U/L (ref 39–117)
ALT: 64 U/L — ABNORMAL HIGH (ref 0–35)
AST: 73 U/L — ABNORMAL HIGH (ref 0–37)
Albumin: 3.9 g/dL (ref 3.5–5.2)
Anion gap: 7 (ref 5–15)
BUN: 6 mg/dL (ref 6–23)
CHLORIDE: 107 meq/L (ref 96–112)
CO2: 26 mmol/L (ref 19–32)
CREATININE: 0.84 mg/dL (ref 0.50–1.10)
Calcium: 9.5 mg/dL (ref 8.4–10.5)
GFR calc non Af Amer: 81 mL/min — ABNORMAL LOW (ref >90)
GLUCOSE: 125 mg/dL — AB (ref 70–99)
POTASSIUM: 3.8 mmol/L (ref 3.5–5.1)
Sodium: 140 mmol/L (ref 135–145)
TOTAL PROTEIN: 7.4 g/dL (ref 6.0–8.3)
Total Bilirubin: 0.5 mg/dL (ref 0.3–1.2)

## 2014-03-21 LAB — LIPASE, BLOOD: Lipase: 37 U/L (ref 11–59)

## 2014-03-21 LAB — TROPONIN I

## 2014-03-21 LAB — ETHANOL: Alcohol, Ethyl (B): 305 mg/dL — ABNORMAL HIGH (ref 0–9)

## 2014-03-21 LAB — ACETAMINOPHEN LEVEL: Acetaminophen (Tylenol), Serum: <10 ug/mL — ABNORMAL LOW (ref 10–30)

## 2014-03-21 MED ORDER — FENTANYL CITRATE 0.05 MG/ML IJ SOLN
50.0000 ug | Freq: Once | INTRAMUSCULAR | Status: AC
  Administered 2014-03-22: 50 ug via INTRAVENOUS
  Filled 2014-03-21: qty 2

## 2014-03-21 MED ORDER — SODIUM CHLORIDE 0.9 % IV BOLUS (SEPSIS)
1000.0000 mL | Freq: Once | INTRAVENOUS | Status: AC
  Administered 2014-03-22: 1000 mL via INTRAVENOUS

## 2014-03-21 MED ORDER — ONDANSETRON HCL 4 MG/2ML IJ SOLN
4.0000 mg | Freq: Once | INTRAMUSCULAR | Status: AC
  Administered 2014-03-22: 4 mg via INTRAVENOUS
  Filled 2014-03-21: qty 2

## 2014-03-21 NOTE — ED Notes (Signed)
Per spouse wears oxygen at home at 2L.  Applied per her request.  States my chest is no longer hurting just my abdomen.

## 2014-03-21 NOTE — ED Notes (Signed)
The pt  Drinks every day.   The pt has had chest and abd pain for 3-4 days.  She has been drinking wine today.  Hx of pancreatitis.

## 2014-03-21 NOTE — ED Provider Notes (Signed)
CSN: [id number]     Arrival date & time 03/21/14  2131 History  This chart was scribed for Marie Rice, MD by Peyton Bottoms, ED Scribe. This patient was seen in room A04C/A04C and the patient's care was started at 11:37 PM.   Chief Complaint  Patient presents with  . chest and abd pain    Patient is a 48 y.o. female presenting with chest pain and abdominal pain. The history is provided by the patient. No language interpreter was used.  Chest Pain Pain location:  L chest Pain quality: aching   Pain radiates to:  Does not radiate Pain radiates to the back: no   Pain severity:  Moderate Associated symptoms: abdominal pain, nausea and vomiting   Associated symptoms: no back pain, no cough, no dizziness, no fever, no numbness, no shortness of breath and no weakness   Risk factors: hypertension   Abdominal Pain Associated symptoms: chest pain, nausea and vomiting   Associated symptoms: no chills, no constipation, no cough, no diarrhea, no fever and no shortness of breath    HPI Comments: Marie [name] is a 48 y.o. female who presents to the Emergency Department complaining of abdominal pain due to a pancreatitis flare up that began 3 days ago. Patient states that she has been drinking alcohol today. She reports associated nausea, vomiting.  She also reports left sided chest pain. She states that her chest pain worsens when she bends downwards. She states that her chest pain has gradually resolved since 3 days ago. She states she took 4 tablets of extra strength Tylenol earlier this evening.   Past Medical History  Diagnosis Date  . Loss of weight   . Sarcoidosis   . Leiomyoma of uterus, unspecified   . Pure hypercholesterolemia   . Obesity, unspecified   . Chronic pancreatitis   . Coronary atherosclerosis of unspecified type of vessel, native or graft   . Hypertension   . Shortness of breath     none since stent 2 yrs ago  . CHF (congestive heart failure)   . OSA (obstructive  sleep apnea)     no CPAP  . Type II diabetes mellitus   . HERDEYCX(448.1)     "weekly" (07/29/2013)   Past Surgical History  Procedure Laterality Date  . Coronary artery bypass graft  ~ 2000    CABG X3  . Tubal ligation  1997  . Hysteroscopy with novasure N/A 09/04/2012    Procedure: HYSTEROSCOPY WITH NOVASURE;  Surgeon: Mora Bellman, MD;  Location: [facility];  Service: Gynecology;  Laterality: N/A;  with removal intrauterine device  . Coronary angioplasty with stent placement  09/2009    "1"   Family History  Problem Relation Age of Onset  . Diabetes Mother   . Coronary artery disease Mother    History  Substance Use Topics  . Smoking status: Current Every Day Smoker -- 0.50 packs/day for 28 years    Types: Cigarettes  . Smokeless tobacco: Never Used  . Alcohol Use: 16.8 oz/week    28 Glasses of wine per week     Comment: 07/29/2013 "3-4 glasses of wine qd"   OB History    Gravida Para Term Preterm AB TAB SAB Ectopic Multiple Living   3 3 3       3      Review of Systems  Constitutional: Negative for fever and chills.  Respiratory: Negative for cough and shortness of breath.   Cardiovascular: Positive for chest pain.  Gastrointestinal: Positive  for nausea, vomiting and abdominal pain. Negative for diarrhea and constipation.  Musculoskeletal: Negative for myalgias, back pain, neck pain and neck stiffness.  Skin: Negative for rash and wound.  Neurological: Negative for dizziness, tremors, weakness and numbness.  All other systems reviewed and are negative.  Allergies  Lisinopril  Home Medications   Prior to Admission medications   Medication Sig Start Date End Date Taking? Authorizing Provider  albuterol (PROVENTIL HFA;VENTOLIN HFA) 108 (90 BASE) MCG/ACT inhaler Inhale 2 puffs into the lungs every 6 (six) hours as needed for wheezing or shortness of breath. 08/13/13  Yes Collene Gobble, MD  aspirin 325 MG tablet Take 325 mg by mouth daily.    Yes Historical Provider, MD   clopidogrel (PLAVIX) 75 MG tablet Take 75 mg by mouth daily with breakfast.    Yes Historical Provider, MD  hydrochlorothiazide (HYDRODIURIL) 25 MG tablet Take 25 mg by mouth every morning.  08/15/12  Yes Oswald Hillock, MD  metFORMIN (GLUCOPHAGE) 500 MG tablet Take 500 mg by mouth daily with breakfast. Take with food   Yes Historical Provider, MD  metoprolol (LOPRESSOR) 50 MG tablet Take 50 mg by mouth daily. Verified Metoprolol Tartrate, pt only takes once daily   Yes Historical Provider, MD  omeprazole (PRILOSEC) 40 MG capsule Take 1 capsule (40 mg total) by mouth daily. 07/31/13  Yes Debbe Odea, MD  pravastatin (PRAVACHOL) 20 MG tablet Take 20 mg by mouth daily.    Yes Historical Provider, MD  HYDROcodone-acetaminophen (NORCO) 5-325 MG per tablet Take 1 tablet by mouth every 4 (four) hours as needed for severe pain. 03/22/14   Marie Rice, MD  ondansetron (ZOFRAN ODT) 4 MG disintegrating tablet 4mg  ODT q4 hours prn nausea/vomit 03/22/14   Marie Rice, MD   Triage Vitals: LMP 08/15/2012  Physical Exam  Constitutional: She is oriented to person, place, and time. She appears well-developed and well-nourished. No distress.  HENT:  Head: Normocephalic and atraumatic.  Mouth/Throat: Oropharynx is clear and moist. No oropharyngeal exudate.  Eyes: Conjunctivae and EOM are normal. Pupils are equal, round, and reactive to light.  Neck: Normal range of motion. Neck supple. No tracheal deviation present.  Cardiovascular: Normal rate and regular rhythm.   Pulmonary/Chest: Effort normal and breath sounds normal. No respiratory distress. She has no wheezes. She has no rales. She exhibits no tenderness.  Abdominal: Soft. Bowel sounds are normal. She exhibits no distension and no mass. There is tenderness (epigastric tenderness with palpation.). There is no rebound and no guarding.  Musculoskeletal: Normal range of motion. She exhibits no edema or tenderness.  No CVA tenderness bilaterally   Neurological: She is alert and oriented to person, place, and time.  Moves all extremities without deficit. Sensation is grossly intact.  Skin: Skin is warm and dry. No rash noted. No erythema.  Psychiatric: She has a normal mood and affect. Her behavior is normal.  Nursing note and vitals reviewed.  ED Course  Procedures (including critical care time)  DIAGNOSTIC STUDIES:   COORDINATION OF CARE: 11:41 PM- Ordered diagnostic EKG and lab work. Advised patient to decrease intake of extra strength Tylenol. Discussed plans to give patient Sublimaze, Zofran and IV fluids. Pt advised of plan for treatment and pt agrees.  Labs Review Labs Reviewed  ETHANOL - Abnormal; Notable for the following:    Alcohol, Ethyl (B) 305 (*)    All other components within normal limits  CBC WITH DIFFERENTIAL - Abnormal; Notable for the following:    Hemoglobin  15.2 (*)    All other components within normal limits  COMPREHENSIVE METABOLIC PANEL - Abnormal; Notable for the following:    Glucose, Bld 125 (*)    AST 73 (*)    ALT 64 (*)    GFR calc non Af Amer 81 (*)    All other components within normal limits  URINALYSIS, ROUTINE W REFLEX MICROSCOPIC - Abnormal; Notable for the following:    Specific Gravity, Urine 1.004 (*)    All other components within normal limits  ACETAMINOPHEN LEVEL - Abnormal; Notable for the following:    Acetaminophen (Tylenol), Serum <10.0 (*)    All other components within normal limits  LIPASE, BLOOD  TROPONIN I  Randolm Idol, ED   Imaging Review Dg Chest 2 View  03/22/2014   CLINICAL DATA:  Chest pain  EXAM: CHEST  2 VIEW  COMPARISON:  07/27/2013  FINDINGS: No cardiomegaly. Stable CABG changes. Stable hilar and mediastinal contours in this patient with adenopathy by CT 08/13/2013. Fine bibasilar and reticular nodular right upper lobe opacities are stable from previous, consistent with sarcoidosis based on prior high-resolution chest CT. There is no edema,  consolidation, effusion, or pneumothorax.  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 No acute superimposed finding.   Electronically Signed   By: Jorje Guild M.D.   On: 03/22/2014 00:42     EKG Interpretation None      Date: 03/22/2014  Rate: 117  Rhythm: sinus tachycardia  QRS Axis: normal  Intervals: normal  ST/T Wave abnormalities: normal  Conduction Disutrbances:none  Narrative Interpretation:   Old EKG Reviewed: unchanged   MDM   Final diagnoses:  Chest pain  Epigastric pain  Alcohol abuse    Chest pain is very atypical. She states it's worse with position changes. She currently is having no chest pain. EKG is unremarkable and troponin 2 is normal. Low suspicion for PE. Patient with epigastric pain that is improved after pain medication. States is consistent with previous episodes of pancreatitis. Patient admits to drinking heavily today which is made the abdominal pain worse. Vital signs remained stable in the emergency department. Heart rate normalized. Workup without acute findings. Will discharge home with pain and nausea medication. Discharged home with husband. She is advised to decrease alcohol consumption. Advised to follow-up with her primary doctor. Return precautions have been given.  I personally performed the services described in this documentation, which was scribed in my presence. The recorded information has been reviewed and is accurate.  Marie Rice, MD 03/22/14 [phone]

## 2014-03-22 ENCOUNTER — Ambulatory Visit
Admission: RE | Admit: 2014-03-22 | Discharge: 2014-03-22 | Disposition: A | Discharge status: Home / Self Care | Payer: 59 | Source: Ambulatory Visit | Attending: Obstetrics & Gynecology | Admitting: Obstetrics & Gynecology

## 2014-03-22 ENCOUNTER — Emergency Department (HOSPITAL_COMMUNITY): Payer: 59

## 2014-03-22 DIAGNOSIS — R928 Other abnormal and inconclusive findings on diagnostic imaging of breast: Secondary | ICD-10-CM

## 2014-03-22 LAB — I-STAT TROPONIN, ED: TROPONIN I, POC: 0 ng/mL (ref 0.00–0.08)

## 2014-03-22 MED ORDER — ONDANSETRON HCL 4 MG/2ML IJ SOLN
4.0000 mg | Freq: Once | INTRAMUSCULAR | Status: AC
  Administered 2014-03-22: 4 mg via INTRAVENOUS
  Filled 2014-03-22: qty 2

## 2014-03-22 MED ORDER — FENTANYL CITRATE 0.05 MG/ML IJ SOLN
50.0000 ug | Freq: Once | INTRAMUSCULAR | Status: AC
  Administered 2014-03-22: 50 ug via INTRAVENOUS
  Filled 2014-03-22: qty 2

## 2014-03-22 MED ORDER — ONDANSETRON 4 MG PO TBDP: ORAL_TABLET | ORAL | Status: DC

## 2014-03-22 MED ORDER — HYDROCODONE-ACETAMINOPHEN 5-325 MG PO TABS: 1.0000 | ORAL_TABLET | ORAL | Status: DC | PRN

## 2014-03-22 NOTE — ED Notes (Signed)
Pt to xray

## 2014-03-22 NOTE — ED Notes (Signed)
Esignature pad not working.  Unable to obtain a signature.

## 2014-03-22 NOTE — Discharge Instructions (Signed)
Do not take Tylenol in addition to the to the [place]. When you do take Tylenol, take only the recommended amount. Call and make appointment to follow-up with your primary doctor. Return immediately for worsening pain, persistent vomiting, fever, or for any concerns.  Abdominal Pain, Women Abdominal (stomach, pelvic, or belly) pain can be caused by many things. It is important to tell your doctor:  The location of the pain.  Does it come and go or is it present all the time?  Are there things that start the pain (eating certain foods, exercise)?  Are there other symptoms associated with the pain (fever, nausea, vomiting, diarrhea)? All of this is helpful to know when trying to find the cause of the pain. CAUSES   Stomach: virus or bacteria infection, or ulcer.  Intestine: appendicitis (inflamed appendix), regional ileitis (Crohn's disease), ulcerative colitis (inflamed colon), irritable bowel syndrome, diverticulitis (inflamed diverticulum of the colon), or cancer of the stomach or intestine.  Gallbladder disease or stones in the gallbladder.  Kidney disease, kidney stones, or infection.  Pancreas infection or cancer.  Fibromyalgia (pain disorder).  Diseases of the female organs:  Uterus: fibroid (non-cancerous) tumors or infection.  Fallopian tubes: infection or tubal pregnancy.  Ovary: cysts or tumors.  Pelvic adhesions (scar tissue).  Endometriosis (uterus lining tissue growing in the pelvis and on the pelvic organs).  Pelvic congestion syndrome (female organs filling up with blood just before the menstrual period).  Pain with the menstrual period.  Pain with ovulation (producing an egg).  Pain with an IUD (intrauterine device, birth control) in the uterus.  Cancer of the female organs.  Functional pain (pain not caused by a disease, may improve without treatment).  Psychological pain.  Depression. DIAGNOSIS  Your doctor will decide the seriousness of your pain  by doing an examination.  Blood tests.  X-rays.  Ultrasound.  CT scan (computed tomography, special type of X-ray).  MRI (magnetic resonance imaging).  Cultures, for infection.  Barium enema (dye inserted in the large intestine, to better view it with X-rays).  Colonoscopy (looking in intestine with a lighted tube).  Laparoscopy (minor surgery, looking in abdomen with a lighted tube).  Major abdominal exploratory surgery (looking in abdomen with a large incision). TREATMENT  The treatment will depend on the cause of the pain.   Many cases can be observed and treated at home.  Over-the-counter medicines recommended by your caregiver.  Prescription medicine.  Antibiotics, for infection.  Birth control pills, for painful periods or for ovulation pain.  Hormone treatment, for endometriosis.  Nerve blocking injections.  Physical therapy.  Antidepressants.  Counseling with a psychologist or psychiatrist.  Minor or major surgery. HOME CARE INSTRUCTIONS   Do not take laxatives, unless directed by your caregiver.  Take over-the-counter pain medicine only if ordered by your caregiver. Do not take aspirin because it can cause an upset stomach or bleeding.  Try a clear liquid diet (broth or water) as ordered by your caregiver. Slowly move to a bland diet, as tolerated, if the pain is related to the stomach or intestine.  Have a thermometer and take your temperature several times a day, and record it.  Bed rest and sleep, if it helps the pain.  Avoid sexual intercourse, if it causes pain.  Avoid stressful situations.  Keep your follow-up appointments and tests, as your caregiver orders.  If the pain does not go away with medicine or surgery, you may try:  Acupuncture.  Relaxation exercises (yoga, meditation).  Group therapy.  Counseling. SEEK MEDICAL CARE IF:   You notice certain foods cause stomach pain.  Your home care treatment is not helping your  pain.  You need stronger pain medicine.  You want your IUD removed.  You feel faint or lightheaded.  You develop nausea and vomiting.  You develop a rash.  You are having side effects or an allergy to your medicine. SEEK IMMEDIATE MEDICAL CARE IF:   Your pain does not go away or gets worse.  You have a fever.  Your pain is felt only in portions of the abdomen. The right side could possibly be appendicitis. The left lower portion of the abdomen could be colitis or diverticulitis.  You are passing blood in your stools (bright red or black tarry stools, with or without vomiting).  You have blood in your urine.  You develop chills, with or without a fever.  You pass out. MAKE SURE YOU:   Understand these instructions.  Will watch your condition.  Will get help right away if you are not doing well or get worse. Document Released: 01/07/2007 Document Revised: 07/27/2013 Document Reviewed: 01/27/2009 [facility] Patient Information 2015 [place], [place]. This information is not intended to replace advice given to you by your health care provider. Make sure you discuss any questions you have with your health care provider.  Alcohol Intoxication Alcohol intoxication occurs when the amount of alcohol that a person has consumed impairs his or her ability to mentally and physically function. Alcohol directly impairs the normal chemical activity of the brain. Drinking large amounts of alcohol can lead to changes in mental function and behavior, and it can cause many physical effects that can be harmful.  Alcohol intoxication can range in severity from mild to very severe. Various factors can affect the level of intoxication that occurs, such as the person's age, gender, weight, frequency of alcohol consumption, and the presence of other medical conditions (such as diabetes, seizures, or heart conditions). Dangerous levels of alcohol intoxication may occur when people drink large amounts of  alcohol in a short period (binge drinking). Alcohol can also be especially dangerous when combined with certain prescription medicines or "recreational" drugs. SIGNS AND SYMPTOMS Some common signs and symptoms of mild alcohol intoxication include:  Loss of coordination.  Changes in mood and behavior.  Impaired judgment.  Slurred speech. As alcohol intoxication progresses to more severe levels, other signs and symptoms will appear. These may include:  Vomiting.  Confusion and impaired memory.  Slowed breathing.  Seizures.  Loss of consciousness. DIAGNOSIS  Your health care provider will take a medical history and perform a physical exam. You will be asked about the amount and type of alcohol you have consumed. Blood tests will be done to measure the concentration of alcohol in your blood. In many places, your blood alcohol level must be lower than 80 mg/dL (0.08%) to legally drive. However, many dangerous effects of alcohol can occur at much lower levels.  TREATMENT  People with alcohol intoxication often do not require treatment. Most of the effects of alcohol intoxication are temporary, and they go away as the alcohol naturally leaves the body. Your health care provider will monitor your condition until you are stable enough to go home. Fluids are sometimes given through an IV access tube to help prevent dehydration.  HOME CARE INSTRUCTIONS  Do not drive after drinking alcohol.  Stay hydrated. Drink enough water and fluids to keep your urine clear or pale yellow. Avoid caffeine.  Only take over-the-counter or prescription medicines as directed by your health care provider.  SEEK MEDICAL CARE IF:   You have persistent vomiting.   You do not feel better after a few days.  You have frequent alcohol intoxication. Your health care provider can help determine if you should see a substance use treatment counselor. SEEK IMMEDIATE MEDICAL CARE IF:   You become shaky or tremble  when you try to stop drinking.   You shake uncontrollably (seizure).   You throw up (vomit) blood. This may be bright red or may look like black coffee grounds.   You have blood in your stool. This may be bright red or may appear as a black, tarry, bad smelling stool.   You become lightheaded or faint.  MAKE SURE YOU:   Understand these instructions.  Will watch your condition.  Will get help right away if you are not doing well or get worse. Document Released: 12/20/2004 Document Revised: 11/12/2012 Document Reviewed: 08/15/2012 [facility] Patient Information 2015 [place], [place]. This information is not intended to replace advice given to you by your health care provider. Make sure you discuss any questions you have with your health care provider.  Chest Pain (Nonspecific) It is often hard to give a specific diagnosis for the cause of chest pain. There is always a chance that your pain could be related to something serious, such as a heart attack or a blood clot in the lungs. You need to follow up with your health care provider for further evaluation. CAUSES   Heartburn.  Pneumonia or bronchitis.  Anxiety or stress.  Inflammation around your heart (pericarditis) or lung (pleuritis or pleurisy).  A blood clot in the lung.  A collapsed lung (pneumothorax). It can develop suddenly on its own (spontaneous pneumothorax) or from trauma to the chest.  Shingles infection (herpes zoster virus). The chest wall is composed of bones, muscles, and cartilage. Any of these can be the source of the pain.  The bones can be bruised by injury.  The muscles or cartilage can be strained by coughing or overwork.  The cartilage can be affected by inflammation and become sore (costochondritis). DIAGNOSIS  Lab tests or other studies may be needed to find the cause of your pain. Your health care provider may have you take a test called an ambulatory electrocardiogram (ECG). An ECG records your  heartbeat patterns over a 24-hour period. You may also have other tests, such as:  Transthoracic echocardiogram (TTE). During echocardiography, sound waves are used to evaluate how blood flows through your heart.  Transesophageal echocardiogram (TEE).  Cardiac monitoring. This allows your health care provider to monitor your heart rate and rhythm in real time.  Holter monitor. This is a portable device that records your heartbeat and can help diagnose heart arrhythmias. It allows your health care provider to track your heart activity for several days, if needed.  Stress tests by exercise or by giving medicine that makes the heart beat faster. TREATMENT   Treatment depends on what may be causing your chest pain. Treatment may include:  Acid blockers for heartburn.  Anti-inflammatory medicine.  Pain medicine for inflammatory conditions.  Antibiotics if an infection is present.  You may be advised to change lifestyle habits. This includes stopping smoking and avoiding alcohol, caffeine, and chocolate.  You may be advised to keep your head raised (elevated) when sleeping. This reduces the chance of acid going backward from your stomach into your esophagus. Most of the time, nonspecific chest  pain will improve within 2-3 days with rest and mild pain medicine.  HOME CARE INSTRUCTIONS   If antibiotics were prescribed, take them as directed. Finish them even if you start to feel better.  For the next few days, avoid physical activities that bring on chest pain. Continue physical activities as directed.  Do not use any tobacco products, including cigarettes, chewing tobacco, or electronic cigarettes.  Avoid drinking alcohol.  Only take medicine as directed by your health care provider.  Follow your health care provider's suggestions for further testing if your chest pain does not go away.  Keep any follow-up appointments you made. If you do not go to an appointment, you could develop  lasting (chronic) problems with pain. If there is any problem keeping an appointment, call to reschedule. SEEK MEDICAL CARE IF:   Your chest pain does not go away, even after treatment.  You have a rash with blisters on your chest.  You have a fever. SEEK IMMEDIATE MEDICAL CARE IF:   You have increased chest pain or pain that spreads to your arm, neck, jaw, back, or abdomen.  You have shortness of breath.  You have an increasing cough, or you cough up blood.  You have severe back or abdominal pain.  You feel nauseous or vomit.  You have severe weakness.  You faint.  You have chills. This is an emergency. Do not wait to see if the pain will go away. Get medical help at once. Call your local emergency services (911 in [place] Do not drive yourself to the hospital. MAKE SURE YOU:   Understand these instructions.  Will watch your condition.  Will get help right away if you are not doing well or get worse. Document Released: 12/20/2004 Document Revised: 03/17/2013 Document Reviewed: 10/16/2007 [facility] Patient Information 2015 [place], [place]. This information is not intended to replace advice given to you by your health care provider. Make sure you discuss any questions you have with your health care provider.

## 2014-05-30 ENCOUNTER — Encounter (HOSPITAL_COMMUNITY): Payer: Self-pay | Admitting: *Deleted

## 2014-05-30 ENCOUNTER — Emergency Department (HOSPITAL_COMMUNITY): Payer: 59

## 2014-05-30 ENCOUNTER — Emergency Department (HOSPITAL_COMMUNITY)
Admission: EM | Admit: 2014-05-30 | Discharge: 2014-05-30 | Disposition: A | Discharge status: Home / Self Care | Payer: 59 | Attending: Emergency Medicine | Admitting: Emergency Medicine

## 2014-05-30 DIAGNOSIS — Z720 Tobacco use: Secondary | ICD-10-CM | POA: Insufficient documentation

## 2014-05-30 DIAGNOSIS — Z8669 Personal history of other diseases of the nervous system and sense organs: Secondary | ICD-10-CM | POA: Diagnosis not present

## 2014-05-30 DIAGNOSIS — Z8719 Personal history of other diseases of the digestive system: Secondary | ICD-10-CM | POA: Insufficient documentation

## 2014-05-30 DIAGNOSIS — R1013 Epigastric pain: Secondary | ICD-10-CM | POA: Insufficient documentation

## 2014-05-30 DIAGNOSIS — Z8742 Personal history of other diseases of the female genital tract: Secondary | ICD-10-CM | POA: Insufficient documentation

## 2014-05-30 DIAGNOSIS — E669 Obesity, unspecified: Secondary | ICD-10-CM | POA: Diagnosis not present

## 2014-05-30 DIAGNOSIS — Z955 Presence of coronary angioplasty implant and graft: Secondary | ICD-10-CM | POA: Diagnosis not present

## 2014-05-30 DIAGNOSIS — Z79899 Other long term (current) drug therapy: Secondary | ICD-10-CM | POA: Diagnosis not present

## 2014-05-30 DIAGNOSIS — E780 Pure hypercholesterolemia: Secondary | ICD-10-CM | POA: Insufficient documentation

## 2014-05-30 DIAGNOSIS — I10 Essential (primary) hypertension: Secondary | ICD-10-CM | POA: Diagnosis not present

## 2014-05-30 DIAGNOSIS — R079 Chest pain, unspecified: Secondary | ICD-10-CM | POA: Diagnosis present

## 2014-05-30 DIAGNOSIS — Z862 Personal history of diseases of the blood and blood-forming organs and certain disorders involving the immune mechanism: Secondary | ICD-10-CM | POA: Insufficient documentation

## 2014-05-30 DIAGNOSIS — Z7982 Long term (current) use of aspirin: Secondary | ICD-10-CM | POA: Diagnosis not present

## 2014-05-30 DIAGNOSIS — Z7902 Long term (current) use of antithrombotics/antiplatelets: Secondary | ICD-10-CM | POA: Diagnosis not present

## 2014-05-30 DIAGNOSIS — I509 Heart failure, unspecified: Secondary | ICD-10-CM | POA: Insufficient documentation

## 2014-05-30 DIAGNOSIS — E119 Type 2 diabetes mellitus without complications: Secondary | ICD-10-CM | POA: Insufficient documentation

## 2014-05-30 DIAGNOSIS — I2510 Atherosclerotic heart disease of native coronary artery without angina pectoris: Secondary | ICD-10-CM | POA: Diagnosis not present

## 2014-05-30 DIAGNOSIS — R1011 Right upper quadrant pain: Secondary | ICD-10-CM | POA: Insufficient documentation

## 2014-05-30 DIAGNOSIS — Z951 Presence of aortocoronary bypass graft: Secondary | ICD-10-CM | POA: Insufficient documentation

## 2014-05-30 LAB — I-STAT TROPONIN, ED
TROPONIN I, POC: 0.01 ng/mL (ref 0.00–0.08)
Troponin i, poc: 0.05 ng/mL (ref 0.00–0.08)

## 2014-05-30 LAB — COMPREHENSIVE METABOLIC PANEL
ALBUMIN: 4.1 g/dL (ref 3.5–5.2)
ALT: 58 U/L — ABNORMAL HIGH (ref 0–35)
AST: 77 U/L — AB (ref 0–37)
Alkaline Phosphatase: 62 U/L (ref 39–117)
Anion gap: 7 (ref 5–15)
BUN: 9 mg/dL (ref 6–23)
CO2: 28 mmol/L (ref 19–32)
Calcium: 9.9 mg/dL (ref 8.4–10.5)
Chloride: 105 mmol/L (ref 96–112)
Creatinine, Ser: 0.69 mg/dL (ref 0.50–1.10)
GFR calc Af Amer: >90 mL/min (ref >90)
Glucose, Bld: 81 mg/dL (ref 70–99)
POTASSIUM: 3.9 mmol/L (ref 3.5–5.1)
SODIUM: 140 mmol/L (ref 135–145)
TOTAL PROTEIN: 8 g/dL (ref 6.0–8.3)
Total Bilirubin: 0.5 mg/dL (ref 0.3–1.2)

## 2014-05-30 LAB — CBC WITH DIFFERENTIAL/PLATELET
BASOS ABS: 0 10*3/uL (ref 0.0–0.1)
Basophils Relative: 0 % (ref 0–1)
EOS PCT: 0 % (ref 0–5)
Eosinophils Absolute: 0 10*3/uL (ref 0.0–0.7)
HCT: 49.1 % — ABNORMAL HIGH (ref 36.0–46.0)
Hemoglobin: 16.7 g/dL — ABNORMAL HIGH (ref 12.0–15.0)
LYMPHS PCT: 24 % (ref 12–46)
Lymphs Abs: 1.7 10*3/uL (ref 0.7–4.0)
MCH: 31.8 pg (ref 26.0–34.0)
MCHC: 34 g/dL (ref 30.0–36.0)
MCV: 93.5 fL (ref 78.0–100.0)
MONOS PCT: 6 % (ref 3–12)
Monocytes Absolute: 0.4 10*3/uL (ref 0.1–1.0)
Neutro Abs: 4.9 10*3/uL (ref 1.7–7.7)
Neutrophils Relative %: 70 % (ref 43–77)
PLATELETS: 245 10*3/uL (ref 150–400)
RBC: 5.25 MIL/uL — AB (ref 3.87–5.11)
RDW: 12.4 % (ref 11.5–15.5)
WBC: 7.1 10*3/uL (ref 4.0–10.5)

## 2014-05-30 LAB — LIPASE, BLOOD: LIPASE: 28 U/L (ref 11–59)

## 2014-05-30 MED ORDER — ONDANSETRON HCL 4 MG/2ML IJ SOLN
4.0000 mg | Freq: Once | INTRAMUSCULAR | Status: AC
  Administered 2014-05-30: 4 mg via INTRAVENOUS
  Filled 2014-05-30: qty 2

## 2014-05-30 MED ORDER — MORPHINE SULFATE 4 MG/ML IJ SOLN
4.0000 mg | Freq: Once | INTRAMUSCULAR | Status: AC
  Administered 2014-05-30: 4 mg via INTRAVENOUS
  Filled 2014-05-30: qty 1

## 2014-05-30 MED ORDER — OXYCODONE-ACETAMINOPHEN 5-325 MG PO TABS: 1.0000 | ORAL_TABLET | Freq: Four times a day (QID) | ORAL | Status: DC | PRN

## 2014-05-30 MED ORDER — SODIUM CHLORIDE 0.9 % IV BOLUS (SEPSIS)
500.0000 mL | Freq: Once | INTRAVENOUS | Status: AC
  Administered 2014-05-30: 500 mL via INTRAVENOUS

## 2014-05-30 MED ORDER — ONDANSETRON HCL 4 MG PO TABS: 4.0000 mg | ORAL_TABLET | Freq: Four times a day (QID) | ORAL | Status: DC

## 2014-05-30 MED ORDER — ONDANSETRON 4 MG PO TBDP
4.0000 mg | ORAL_TABLET | Freq: Once | ORAL | Status: AC
  Administered 2014-05-30: 4 mg via ORAL
  Filled 2014-05-30: qty 1

## 2014-05-30 MED ORDER — SODIUM CHLORIDE 0.9 % IV BOLUS (SEPSIS)
1000.0000 mL | Freq: Once | INTRAVENOUS | Status: AC
  Administered 2014-05-30: 1000 mL via INTRAVENOUS

## 2014-05-30 MED ORDER — OXYCODONE-ACETAMINOPHEN 5-325 MG PO TABS
2.0000 | ORAL_TABLET | Freq: Once | ORAL | Status: AC
  Administered 2014-05-30: 2 via ORAL
  Filled 2014-05-30: qty 2

## 2014-05-30 NOTE — ED Notes (Signed)
Pt reports onset this am of chest pains, sob and pain into right arm and nausea. ekg done at triage and airway intact.

## 2014-05-30 NOTE — ED Provider Notes (Signed)
CSN: [id number]     Arrival date & time 05/30/14  0941 History   First MD Initiated Contact with Patient 05/30/14 1001     Chief Complaint  Patient presents with  . Chest Pain  . Shortness of Breath     (Consider location/radiation/quality/duration/timing/severity/associated sxs/prior Treatment) HPI  PCP: Simona Huh, MD Blood pressure 143/76, pulse 92, temperature 97.8 F (36.6 C), temperature source Oral, resp. rate 18, last menstrual period 08/15/2012, SpO2 98 %.  Marie [name] is a 49 y.o.female with a significant PMH of weight loss, sarcoidosis, Leo myoma, hypercholesterolemia, obesity, chronic pancreatitis, hypertension, congestive heart vera, shortest of breath, type 2 diabetes, headache presents to the ER with complaints of acute onset of the gastric pain. Patient has a history of alcohol-induced pancreatitis. She admits to having significant amount of greasy food last night at a buffet. She also admits to drinking alcohol. Her pain started acutely this morning some epigastric chest pain, anxiety and transient shortness of breath. She reports feeling nauseous and having some pain going into her right arm.   Negative Review of Symptoms: She denies having any fevers, cough, diarrhea, body aches, confusion, recent injury, dysuria, abnormal bleeding, discharge  Past Medical History  Diagnosis Date  . Loss of weight   . Sarcoidosis   . Leiomyoma of uterus, unspecified   . Pure hypercholesterolemia   . Obesity, unspecified   . Chronic pancreatitis   . Coronary atherosclerosis of unspecified type of vessel, native or graft   . Hypertension   . Shortness of breath     none since stent 2 yrs ago  . CHF (congestive heart failure)   . OSA (obstructive sleep apnea)     no CPAP  . Type II diabetes mellitus   . WNUUVOZD(664.4)     "weekly" (07/29/2013)   Past Surgical History  Procedure Laterality Date  . Coronary artery bypass graft  ~ 2000    CABG X3  . Tubal ligation   1997  . Hysteroscopy with novasure N/A 09/04/2012    Procedure: HYSTEROSCOPY WITH NOVASURE;  Surgeon: Mora Bellman, MD;  Location: [facility];  Service: Gynecology;  Laterality: N/A;  with removal intrauterine device  . Coronary angioplasty with stent placement  09/2009    "1"   Family History  Problem Relation Age of Onset  . Diabetes Mother   . Coronary artery disease Mother    History  Substance Use Topics  . Smoking status: Current Every Day Smoker -- 0.50 packs/day for 28 years    Types: Cigarettes  . Smokeless tobacco: Never Used  . Alcohol Use: 16.8 oz/week    28 Glasses of wine per week     Comment: 07/29/2013 "3-4 glasses of wine qd"   OB History    Gravida Para Term Preterm AB TAB SAB Ectopic Multiple Living   3 3 3       3      Review of Systems  10 Systems reviewed and are negative for acute change except as noted in the HPI.    Allergies  Lisinopril  Home Medications   Prior to Admission medications   Medication Sig Start Date End Date Taking? Authorizing Provider  albuterol (PROVENTIL HFA;VENTOLIN HFA) 108 (90 BASE) MCG/ACT inhaler Inhale 2 puffs into the lungs every 6 (six) hours as needed for wheezing or shortness of breath. 08/13/13  Yes Collene Gobble, MD  aspirin 325 MG tablet Take 325 mg by mouth daily.    Yes Historical Provider, MD  clopidogrel (  PLAVIX) 75 MG tablet Take 75 mg by mouth daily with breakfast.    Yes Historical Provider, MD  hydrochlorothiazide (HYDRODIURIL) 25 MG tablet Take 25 mg by mouth every morning.  08/15/12  Yes Oswald Hillock, MD  HYDROcodone-acetaminophen (NORCO) 5-325 MG per tablet Take 1 tablet by mouth every 4 (four) hours as needed for severe pain. 03/22/14  Yes Julianne Rice, MD  metFORMIN (GLUCOPHAGE) 500 MG tablet Take 500 mg by mouth daily with breakfast. Take with food   Yes Historical Provider, MD  metoprolol (LOPRESSOR) 50 MG tablet Take 50 mg by mouth daily. Verified Metoprolol Tartrate, pt only takes once daily   Yes  Historical Provider, MD  omeprazole (PRILOSEC) 40 MG capsule Take 1 capsule (40 mg total) by mouth daily. 07/31/13  Yes Debbe Odea, MD  ondansetron (ZOFRAN ODT) 4 MG disintegrating tablet 4mg  ODT q4 hours prn nausea/vomit Patient taking differently: Take 4 mg by mouth every 4 (four) hours as needed for nausea or vomiting.  03/22/14  Yes Julianne Rice, MD  pravastatin (PRAVACHOL) 20 MG tablet Take 20 mg by mouth daily.    Yes Historical Provider, MD  ondansetron (ZOFRAN) 4 MG tablet Take 1 tablet (4 mg total) by mouth every 6 (six) hours. 05/30/14   Linus Mako, PA-C  oxyCODONE-acetaminophen (PERCOCET/ROXICET) 5-325 MG per tablet Take 1 tablet by mouth every 6 (six) hours as needed for severe pain. 05/30/14   Tiffany Marilu Favre, PA-C   BP 159/88 mmHg  Pulse 68  Temp(Src) 97.8 F (36.6 C) (Oral)  Resp 16  SpO2 95%  LMP 08/15/2012 Physical Exam  Constitutional: She appears well-developed and well-nourished. No distress.  HENT:  Head: Normocephalic and atraumatic.  Eyes: Pupils are equal, round, and reactive to light.  Neck: Normal range of motion. Neck supple.  Cardiovascular: Normal rate and regular rhythm.   Pulmonary/Chest: Effort normal.  Abdominal: Soft. Bowel sounds are normal. She exhibits distension. She exhibits no fluid wave and no ascites. There is no hepatosplenomegaly. There is tenderness in the epigastric area. There is no rigidity, no rebound, no guarding and no CVA tenderness.  Neurological: She is alert.  Skin: Skin is warm and dry.  Psychiatric: She has a normal mood and affect. Her speech is normal.  Nursing note and vitals reviewed.   ED Course  Procedures (including critical care time) Labs Review Labs Reviewed  COMPREHENSIVE METABOLIC PANEL - Abnormal; Notable for the following:    AST 77 (*)    ALT 58 (*)    All other components within normal limits  CBC WITH DIFFERENTIAL/PLATELET - Abnormal; Notable for the following:    RBC 5.25 (*)    Hemoglobin 16.7  (*)    HCT 49.1 (*)    All other components within normal limits  LIPASE, BLOOD  I-STAT TROPOININ, ED  Randolm Idol, ED    Imaging Review Dg Chest 2 View  05/30/2014   CLINICAL DATA:  Bilateral chest pain. Shortness breath. Nausea. Sarcoidosis.  EXAM: CHEST  2 VIEW  COMPARISON:  03/22/2014  FINDINGS: Heart size is within normal limits. Prior CABG noted. Bilateral pleural-parenchymal scarring is seen and is unchanged in appearance. No evidence of acute pulmonary infiltrate or pleural effusion. No definite mass or lymphadenopathy identified.  IMPRESSION: Stable bilateral pleural-parenchymal scarring.  No active disease.   Electronically Signed   By: Earle Gell M.D.   On: 05/30/2014 11:59   US Abdomen Limited  05/30/2014   CLINICAL DATA:  Right upper quadrant pain for 1  day. History pancreatitis. Hypertension. Sarcoidosis.  EXAM: US ABDOMEN LIMITED - RIGHT UPPER QUADRANT  COMPARISON:  08/13/2013 abdominal ultrasound.  CT 08/16/2009.  FINDINGS: Gallbladder:  No gallstones or wall thickening visualized. No sonographic Murphy sign noted.  Common bile duct:  Diameter: Normal, 6 mm.  Liver:  No focal lesion identified. Within normal limits in parenchymal echogenicity.  IMPRESSION: Normal right upper quadrant ultrasound. No explanation for patient's symptoms.   Electronically Signed   By: Abigail Miyamoto M.D.   On: 05/30/2014 15:08     EKG Interpretation None      MDM   Final diagnoses:  Epigastric pain  RUQ abdominal pain    Medications  sodium chloride 0.9 % bolus 1,000 mL (0 mLs Intravenous Stopped 05/30/14 1224)  ondansetron (ZOFRAN) injection 4 mg (4 mg Intravenous Given 05/30/14 1050)  morphine 4 MG/ML injection 4 mg (4 mg Intravenous Given 05/30/14 1050)  sodium chloride 0.9 % bolus 500 mL (0 mLs Intravenous Stopped 05/30/14 1602)  morphine 4 MG/ML injection 4 mg (4 mg Intravenous Given 05/30/14 1246)  oxyCODONE-acetaminophen (PERCOCET/ROXICET) 5-325 MG per tablet 2 tablet (2 tablets Oral Given  05/30/14 1601)  ondansetron (ZOFRAN-ODT) disintegrating tablet 4 mg (4 mg Oral Given 05/30/14 1602)    The patients pain of her chest and SOB resolved when her anxiety improved-chest xray shows no acute findings and she had two negative Troponin labs.  Her epigastric pain developed into RUQ pain.  Her ALT and AST are elevated but they are close to patients baseline, she admits to drinking alcohol and has hx of alcohol induced pancreatitis, neg lipase. No significant changes to CBC. The abdomen US [place] normal and without explanation for patients symptoms.  On re-evalaution the patient is no longer having any pain.  D/dx is acid reflux, anxiety, peptic ulcer, early pancreatitis, biliary colic. The patient has been made aware that as of right now, no significant findings are apparent. She tolerates fluids and is no longer having pain. She has been told that she needs to return to the ER as I can not r/o what may develop in the future. She voices her understanding and is agreeable.  49 y.o.Marie [name]'s evaluation in the Emergency Department is complete. It has been determined that no acute conditions requiring further emergency intervention are present at this time. The patient/guardian have been advised of the diagnosis and plan. We have discussed signs and symptoms that warrant return to the ED, such as changes or worsening in symptoms.  Vital signs are stable at discharge. Filed Vitals:   05/30/14 1628  BP: 159/88  Pulse: 68  Temp:   Resp: 16    Patient/guardian has voiced understanding and agreed to follow-up with the PCP or specialist.     Delos Haring, PA-C 06/01/14 [place], MD 06/02/14 1413

## 2014-05-30 NOTE — Discharge Instructions (Signed)

## 2014-07-17 NOTE — ED Provider Notes (Signed)
49 y.o. Female with epigastric pain c.w. Pancreatitis   Patient hemodynamically stable here in ed.   I performed a history and physical examination of Marie [name] and discussed her management with Ms Carlota Raspberry.  I agree with the history, physical, assessment, and plan of care, with the following exceptions: None  I was present for the following procedures: None Time Spent in Critical Care of the patient: None Time spent in discussions with the patient and family: 7  Nathanyal Ashmead Shaune Pollack, MD 07/17/14 [phone]

## 2014-08-04 ENCOUNTER — Emergency Department (HOSPITAL_COMMUNITY): Payer: 59

## 2014-08-04 ENCOUNTER — Emergency Department (HOSPITAL_COMMUNITY)
Admission: EM | Admit: 2014-08-04 | Discharge: 2014-08-04 | Disposition: A | Discharge status: Home / Self Care | Payer: 59 | Attending: Emergency Medicine | Admitting: Emergency Medicine

## 2014-08-04 DIAGNOSIS — E780 Pure hypercholesterolemia: Secondary | ICD-10-CM | POA: Diagnosis not present

## 2014-08-04 DIAGNOSIS — Z8669 Personal history of other diseases of the nervous system and sense organs: Secondary | ICD-10-CM | POA: Insufficient documentation

## 2014-08-04 DIAGNOSIS — Z862 Personal history of diseases of the blood and blood-forming organs and certain disorders involving the immune mechanism: Secondary | ICD-10-CM | POA: Diagnosis not present

## 2014-08-04 DIAGNOSIS — I509 Heart failure, unspecified: Secondary | ICD-10-CM | POA: Diagnosis not present

## 2014-08-04 DIAGNOSIS — E669 Obesity, unspecified: Secondary | ICD-10-CM | POA: Diagnosis not present

## 2014-08-04 DIAGNOSIS — R109 Unspecified abdominal pain: Secondary | ICD-10-CM | POA: Diagnosis present

## 2014-08-04 DIAGNOSIS — Z79899 Other long term (current) drug therapy: Secondary | ICD-10-CM | POA: Insufficient documentation

## 2014-08-04 DIAGNOSIS — I2510 Atherosclerotic heart disease of native coronary artery without angina pectoris: Secondary | ICD-10-CM | POA: Diagnosis not present

## 2014-08-04 DIAGNOSIS — Z720 Tobacco use: Secondary | ICD-10-CM | POA: Insufficient documentation

## 2014-08-04 DIAGNOSIS — Z8541 Personal history of malignant neoplasm of cervix uteri: Secondary | ICD-10-CM | POA: Diagnosis not present

## 2014-08-04 DIAGNOSIS — R1013 Epigastric pain: Secondary | ICD-10-CM | POA: Diagnosis not present

## 2014-08-04 DIAGNOSIS — Z7982 Long term (current) use of aspirin: Secondary | ICD-10-CM | POA: Insufficient documentation

## 2014-08-04 DIAGNOSIS — Z8719 Personal history of other diseases of the digestive system: Secondary | ICD-10-CM | POA: Diagnosis not present

## 2014-08-04 DIAGNOSIS — R112 Nausea with vomiting, unspecified: Secondary | ICD-10-CM | POA: Insufficient documentation

## 2014-08-04 DIAGNOSIS — E119 Type 2 diabetes mellitus without complications: Secondary | ICD-10-CM | POA: Diagnosis not present

## 2014-08-04 DIAGNOSIS — I10 Essential (primary) hypertension: Secondary | ICD-10-CM | POA: Diagnosis not present

## 2014-08-04 DIAGNOSIS — Z9861 Coronary angioplasty status: Secondary | ICD-10-CM | POA: Insufficient documentation

## 2014-08-04 DIAGNOSIS — R197 Diarrhea, unspecified: Secondary | ICD-10-CM | POA: Insufficient documentation

## 2014-08-04 DIAGNOSIS — Z951 Presence of aortocoronary bypass graft: Secondary | ICD-10-CM | POA: Insufficient documentation

## 2014-08-04 LAB — I-STAT TROPONIN, ED: Troponin i, poc: 0 ng/mL (ref 0.00–0.08)

## 2014-08-04 LAB — CBC WITH DIFFERENTIAL/PLATELET
Basophils Absolute: 0 10*3/uL (ref 0.0–0.1)
Basophils Relative: 0 % (ref 0–1)
EOS ABS: 0.1 10*3/uL (ref 0.0–0.7)
Eosinophils Relative: 1 % (ref 0–5)
HCT: 48.3 % — ABNORMAL HIGH (ref 36.0–46.0)
Hemoglobin: 16.1 g/dL — ABNORMAL HIGH (ref 12.0–15.0)
Lymphocytes Relative: 18 % (ref 12–46)
Lymphs Abs: 1.1 10*3/uL (ref 0.7–4.0)
MCH: 31.3 pg (ref 26.0–34.0)
MCHC: 33.3 g/dL (ref 30.0–36.0)
MCV: 94 fL (ref 78.0–100.0)
MONO ABS: 0.5 10*3/uL (ref 0.1–1.0)
Monocytes Relative: 8 % (ref 3–12)
NEUTROS ABS: 4.3 10*3/uL (ref 1.7–7.7)
NEUTROS PCT: 73 % (ref 43–77)
Platelets: 210 10*3/uL (ref 150–400)
RBC: 5.14 MIL/uL — ABNORMAL HIGH (ref 3.87–5.11)
RDW: 12.4 % (ref 11.5–15.5)
WBC: 5.9 10*3/uL (ref 4.0–10.5)

## 2014-08-04 LAB — COMPREHENSIVE METABOLIC PANEL
ALBUMIN: 3.8 g/dL (ref 3.5–5.0)
ALK PHOS: 62 U/L (ref 38–126)
ALT: 50 U/L (ref 14–54)
AST: 70 U/L — AB (ref 15–41)
Anion gap: 14 (ref 5–15)
BUN: 10 mg/dL (ref 6–20)
CHLORIDE: 107 mmol/L (ref 101–111)
CO2: 18 mmol/L — ABNORMAL LOW (ref 22–32)
Calcium: 9.4 mg/dL (ref 8.9–10.3)
Creatinine, Ser: 0.77 mg/dL (ref 0.44–1.00)
GFR calc Af Amer: >60 mL/min (ref >60)
GFR calc non Af Amer: >60 mL/min (ref >60)
Glucose, Bld: 74 mg/dL (ref 70–99)
Potassium: 4.4 mmol/L (ref 3.5–5.1)
Sodium: 139 mmol/L (ref 135–145)
Total Bilirubin: 0.6 mg/dL (ref 0.3–1.2)
Total Protein: 7.4 g/dL (ref 6.5–8.1)

## 2014-08-04 LAB — LIPASE, BLOOD: LIPASE: 22 U/L (ref 22–51)

## 2014-08-04 MED ORDER — SODIUM CHLORIDE 0.9 % IV BOLUS (SEPSIS)
1000.0000 mL | Freq: Once | INTRAVENOUS | Status: AC
  Administered 2014-08-04: 1000 mL via INTRAVENOUS

## 2014-08-04 MED ORDER — HYDROMORPHONE HCL 1 MG/ML IJ SOLN
1.0000 mg | Freq: Once | INTRAMUSCULAR | Status: AC
  Administered 2014-08-04: 1 mg via INTRAVENOUS
  Filled 2014-08-04: qty 1

## 2014-08-04 MED ORDER — ONDANSETRON HCL 4 MG/2ML IJ SOLN
4.0000 mg | Freq: Once | INTRAMUSCULAR | Status: AC
Start: 2014-08-04 — End: 2014-08-04
  Administered 2014-08-04: 4 mg via INTRAVENOUS
  Filled 2014-08-04: qty 2

## 2014-08-04 MED ORDER — OXYCODONE-ACETAMINOPHEN 5-325 MG PO TABS
1.0000 | ORAL_TABLET | ORAL | Status: DC | PRN
Start: 2014-08-04 — End: 2014-11-17

## 2014-08-04 MED ORDER — ONDANSETRON HCL 4 MG/2ML IJ SOLN
4.0000 mg | Freq: Once | INTRAMUSCULAR | Status: AC
  Administered 2014-08-04: 4 mg via INTRAVENOUS
  Filled 2014-08-04: qty 2

## 2014-08-04 MED ORDER — MORPHINE SULFATE 4 MG/ML IJ SOLN
4.0000 mg | Freq: Once | INTRAMUSCULAR | Status: AC
  Administered 2014-08-04: 4 mg via INTRAVENOUS
  Filled 2014-08-04: qty 1

## 2014-08-04 MED ORDER — ONDANSETRON 4 MG PO TBDP: 4.0000 mg | ORAL_TABLET | Freq: Three times a day (TID) | ORAL | Status: DC | PRN

## 2014-08-04 NOTE — ED Notes (Signed)
Pt reports hx of pancreatitis and thinks that's whats going on today; epigastric pain with radiation to back; increasing SOB. States woke up this morning at 0430 and was panting. Reports chest pressure and tightness.

## 2014-08-04 NOTE — Discharge Instructions (Signed)
Take Percocet as needed for pain. Take zofran as needed for nausea. Follow up with your doctor as needed for further evaluation. Refer to attached documents for more information.

## 2014-08-04 NOTE — ED Provider Notes (Signed)
CSN: [id number]     Arrival date & time 08/04/14  5784 History   First MD Initiated Contact with Patient 08/04/14 [phone]     Chief Complaint  Patient presents with  . Pancreatitis  . Shortness of Breath     (Consider location/radiation/quality/duration/timing/severity/associated sxs/prior Treatment) HPI Comments: Patient is a 49 year old female with a past medical history of diabetes, obesity, pancreatitis, and sarcoidosis who presents with abdominal pain that started last night after drinking 3 glasses of wine.  The pain is located in the epigastric area and radiates to her back. The pain is described as aching and severe. The pain started gradually and progressively worsened since the onset. No alleviating/aggravating factors. The patient has tried pepto bismol and tylenol for symptoms without relief. Associated symptoms include NVD and some SOB when the pain worsens. Patient denies fever, headache, chest pain, dysuria, constipation, abnormal vaginal bleeding/discharge.      Past Medical History  Diagnosis Date  . Loss of weight   . Sarcoidosis   . Leiomyoma of uterus, unspecified   . Pure hypercholesterolemia   . Obesity, unspecified   . Chronic pancreatitis   . Coronary atherosclerosis of unspecified type of vessel, native or graft   . Hypertension   . Shortness of breath     none since stent 2 yrs ago  . CHF (congestive heart failure)   . OSA (obstructive sleep apnea)     no CPAP  . Type II diabetes mellitus   . XBMWUXLK(440.1)     "weekly" (07/29/2013)   Past Surgical History  Procedure Laterality Date  . Coronary artery bypass graft  ~ 2000    CABG X3  . Tubal ligation  1997  . Hysteroscopy with novasure N/A 09/04/2012    Procedure: HYSTEROSCOPY WITH NOVASURE;  Surgeon: Mora Bellman, MD;  Location: [facility];  Service: Gynecology;  Laterality: N/A;  with removal intrauterine device  . Coronary angioplasty with stent placement  09/2009    "1"   Family History  Problem  Relation Age of Onset  . Diabetes Mother   . Coronary artery disease Mother    History  Substance Use Topics  . Smoking status: Current Every Day Smoker -- 0.50 packs/day for 28 years    Types: Cigarettes  . Smokeless tobacco: Never Used  . Alcohol Use: 16.8 oz/week    28 Glasses of wine per week     Comment: 07/29/2013 "3-4 glasses of wine qd"   OB History    Gravida Para Term Preterm AB TAB SAB Ectopic Multiple Living   3 3 3       3      Review of Systems  Gastrointestinal: Positive for nausea, vomiting, abdominal pain and diarrhea.  All other systems reviewed and are negative.     Allergies  Lisinopril  Home Medications   Prior to Admission medications   Medication Sig Start Date End Date Taking? Authorizing Provider  albuterol (PROVENTIL HFA;VENTOLIN HFA) 108 (90 BASE) MCG/ACT inhaler Inhale 2 puffs into the lungs every 6 (six) hours as needed for wheezing or shortness of breath. 08/13/13   Collene Gobble, MD  aspirin 325 MG tablet Take 325 mg by mouth daily.     Historical Provider, MD  clopidogrel (PLAVIX) 75 MG tablet Take 75 mg by mouth daily with breakfast.     Historical Provider, MD  hydrochlorothiazide (HYDRODIURIL) 25 MG tablet Take 25 mg by mouth every morning.  08/15/12   Oswald Hillock, MD  HYDROcodone-acetaminophen [facility])  5-325 MG per tablet Take 1 tablet by mouth every 4 (four) hours as needed for severe pain. 03/22/14   Julianne Rice, MD  metFORMIN (GLUCOPHAGE) 500 MG tablet Take 500 mg by mouth daily with breakfast. Take with food    Historical Provider, MD  metoprolol (LOPRESSOR) 50 MG tablet Take 50 mg by mouth daily. Verified Metoprolol Tartrate, pt only takes once daily    Historical Provider, MD  omeprazole (PRILOSEC) 40 MG capsule Take 1 capsule (40 mg total) by mouth daily. 07/31/13   Debbe Odea, MD  ondansetron (ZOFRAN ODT) 4 MG disintegrating tablet 4mg  ODT q4 hours prn nausea/vomit Patient taking differently: Take 4 mg by mouth every 4 (four) hours  as needed for nausea or vomiting.  03/22/14   Julianne Rice, MD  ondansetron (ZOFRAN) 4 MG tablet Take 1 tablet (4 mg total) by mouth every 6 (six) hours. 05/30/14   Delos Haring, PA-C  oxyCODONE-acetaminophen (PERCOCET/ROXICET) 5-325 MG per tablet Take 1 tablet by mouth every 6 (six) hours as needed for severe pain. 05/30/14   Tiffany Carlota Raspberry, PA-C  pravastatin (PRAVACHOL) 20 MG tablet Take 20 mg by mouth daily.     Historical Provider, MD   BP 146/88 mmHg  Pulse 78  Temp(Src) 98.6 F (37 C) (Oral)  Resp 18  SpO2 99%  LMP 08/15/2012 Physical Exam  Constitutional: She is oriented to person, place, and time. She appears well-developed and well-nourished. No distress.  HENT:  Head: Normocephalic and atraumatic.  Eyes: Conjunctivae and EOM are normal.  Neck: Normal range of motion.  Cardiovascular: Normal rate and regular rhythm.  Exam reveals no gallop and no friction rub.   No murmur heard. Pulmonary/Chest: Effort normal and breath sounds normal. She has no wheezes. She has no rales. She exhibits no tenderness.  Abdominal: Soft. She exhibits no distension. There is tenderness. There is no rebound and no guarding.  Mild epigastric tenderness to palpation. No other focal tenderness.   Musculoskeletal: Normal range of motion.  Neurological: She is alert and oriented to person, place, and time. Coordination normal.  Speech is goal-oriented. Moves limbs without ataxia.   Skin: Skin is warm and dry.  Psychiatric: She has a normal mood and affect. Her behavior is normal.  Nursing note and vitals reviewed.   ED Course  Procedures (including critical care time) Labs Review Labs Reviewed  COMPREHENSIVE METABOLIC PANEL - Abnormal; Notable for the following:    CO2 18 (*)    AST 70 (*)    All other components within normal limits  CBC WITH DIFFERENTIAL/PLATELET - Abnormal; Notable for the following:    RBC 5.14 (*)    Hemoglobin 16.1 (*)    HCT 48.3 (*)    All other components within  normal limits  LIPASE, BLOOD  I-STAT TROPOININ, ED    Imaging Review Dg Chest 2 View  08/04/2014   CLINICAL DATA:  49 year old female with a history of chest pain. Pancreatitis  EXAM: CHEST - 2 VIEW  COMPARISON:  05/30/2014, chest CT 08/13/2013  FINDINGS: Cardiomediastinal silhouette unchanged. Atherosclerotic calcifications of the aortic arch. Surgical changes of prior median sternotomy and CABG. No evidence of pulmonary vascular congestion.  Stigmata of emphysema, with increased retrosternal airspace, flattened hemidiaphragms, increased AP diameter, and hyperinflation on the AP view.  Linear opacities/nodular opacities of the bilateral upper lungs, similar to the comparison plain film and CT. No pneumothorax. No confluent airspace disease. No pleural effusion.  No displaced fracture.  Unremarkable appearance of the upper abdomen.  IMPRESSION: No radiographic evidence of acute cardiopulmonary disease, with a background of chronic lung scarring/architectural distortion, compatible with the patient's history of sarcoidosis.  Changes of emphysema.  Signed,  Dulcy Fanny. Earleen Newport, DO  [facility]  [facility]   Electronically Signed   By: Corrie Mckusick D.O.   On: 08/04/2014 08:35     EKG Interpretation   Date/Time:  Wednesday Aug 04 2014 07:55:40 EDT Ventricular Rate:  88 PR Interval:  130 QRS Duration: 72 QT Interval:  384 QTC Calculation: 464 R Axis:   88 Text Interpretation:  Sinus rhythm with Possible Premature atrial  complexes with Abberant conduction Right atrial enlargement Low voltage  QRS Nonspecific ST abnormality Abnormal ECG artifact present , otherwise  no significant changes compared to prior Confirmed by Canary Brim  MD, MARTHA  [phone]) on 08/04/2014 11:47:21 AM      MDM   Final diagnoses:  Epigastric abdominal pain    9:29 AM Labs pending. Chest xray unremarkable for acute changes. Vitals stable and patient afebrile.   12:36  PM Labs and chest xray unremarkable for acute changes. Patient feeling better. Vitals stable and patient afebrile. No further evaluation needed at this time.     Alvina Chou, PA-C 08/04/14 [place], MD 08/04/14 [phone]

## 2014-09-21 ENCOUNTER — Emergency Department (HOSPITAL_COMMUNITY)
Admission: EM | Admit: 2014-09-21 | Discharge: 2014-09-21 | Disposition: A | Discharge status: Home / Self Care | Payer: 59 | Attending: Emergency Medicine | Admitting: Emergency Medicine

## 2014-09-21 ENCOUNTER — Encounter (HOSPITAL_COMMUNITY): Payer: Self-pay

## 2014-09-21 DIAGNOSIS — Z7982 Long term (current) use of aspirin: Secondary | ICD-10-CM | POA: Diagnosis not present

## 2014-09-21 DIAGNOSIS — Z951 Presence of aortocoronary bypass graft: Secondary | ICD-10-CM | POA: Insufficient documentation

## 2014-09-21 DIAGNOSIS — R1013 Epigastric pain: Secondary | ICD-10-CM

## 2014-09-21 DIAGNOSIS — Z720 Tobacco use: Secondary | ICD-10-CM | POA: Insufficient documentation

## 2014-09-21 DIAGNOSIS — I10 Essential (primary) hypertension: Secondary | ICD-10-CM | POA: Insufficient documentation

## 2014-09-21 DIAGNOSIS — E669 Obesity, unspecified: Secondary | ICD-10-CM | POA: Insufficient documentation

## 2014-09-21 DIAGNOSIS — I509 Heart failure, unspecified: Secondary | ICD-10-CM | POA: Insufficient documentation

## 2014-09-21 DIAGNOSIS — Z9861 Coronary angioplasty status: Secondary | ICD-10-CM | POA: Insufficient documentation

## 2014-09-21 DIAGNOSIS — Z79899 Other long term (current) drug therapy: Secondary | ICD-10-CM | POA: Insufficient documentation

## 2014-09-21 DIAGNOSIS — K2970 Gastritis, unspecified, without bleeding: Secondary | ICD-10-CM | POA: Diagnosis not present

## 2014-09-21 DIAGNOSIS — E119 Type 2 diabetes mellitus without complications: Secondary | ICD-10-CM | POA: Diagnosis not present

## 2014-09-21 DIAGNOSIS — G8929 Other chronic pain: Secondary | ICD-10-CM

## 2014-09-21 DIAGNOSIS — Z862 Personal history of diseases of the blood and blood-forming organs and certain disorders involving the immune mechanism: Secondary | ICD-10-CM | POA: Insufficient documentation

## 2014-09-21 DIAGNOSIS — K860 Alcohol-induced chronic pancreatitis: Secondary | ICD-10-CM | POA: Diagnosis not present

## 2014-09-21 DIAGNOSIS — R112 Nausea with vomiting, unspecified: Secondary | ICD-10-CM | POA: Diagnosis not present

## 2014-09-21 DIAGNOSIS — I2510 Atherosclerotic heart disease of native coronary artery without angina pectoris: Secondary | ICD-10-CM | POA: Insufficient documentation

## 2014-09-21 LAB — CBC WITH DIFFERENTIAL/PLATELET
Basophils Absolute: 0 10*3/uL (ref 0.0–0.1)
Basophils Relative: 0 % (ref 0–1)
Eosinophils Absolute: 0.1 10*3/uL (ref 0.0–0.7)
Eosinophils Relative: 2 % (ref 0–5)
HCT: 45.5 % (ref 36.0–46.0)
HEMOGLOBIN: 15.2 g/dL — AB (ref 12.0–15.0)
LYMPHS ABS: 1.4 10*3/uL (ref 0.7–4.0)
Lymphocytes Relative: 28 % (ref 12–46)
MCH: 31.7 pg (ref 26.0–34.0)
MCHC: 33.4 g/dL (ref 30.0–36.0)
MCV: 95 fL (ref 78.0–100.0)
MONOS PCT: 9 % (ref 3–12)
Monocytes Absolute: 0.5 10*3/uL (ref 0.1–1.0)
NEUTROS ABS: 3 10*3/uL (ref 1.7–7.7)
NEUTROS PCT: 61 % (ref 43–77)
PLATELETS: 244 10*3/uL (ref 150–400)
RBC: 4.79 MIL/uL (ref 3.87–5.11)
RDW: 12.1 % (ref 11.5–15.5)
WBC: 4.9 10*3/uL (ref 4.0–10.5)

## 2014-09-21 LAB — COMPREHENSIVE METABOLIC PANEL
ALK PHOS: 55 U/L (ref 38–126)
ALT: 42 U/L (ref 14–54)
AST: 52 U/L — AB (ref 15–41)
Albumin: 3.7 g/dL (ref 3.5–5.0)
Anion gap: 12 (ref 5–15)
BUN: 11 mg/dL (ref 6–20)
CALCIUM: 8.8 mg/dL — AB (ref 8.9–10.3)
CO2: 20 mmol/L — ABNORMAL LOW (ref 22–32)
Chloride: 107 mmol/L (ref 101–111)
Creatinine, Ser: 0.69 mg/dL (ref 0.44–1.00)
GFR calc Af Amer: >60 mL/min (ref >60)
GFR calc non Af Amer: >60 mL/min (ref >60)
GLUCOSE: 112 mg/dL — AB (ref 65–99)
POTASSIUM: 3.8 mmol/L (ref 3.5–5.1)
Sodium: 139 mmol/L (ref 135–145)
Total Bilirubin: 0.4 mg/dL (ref 0.3–1.2)
Total Protein: 7 g/dL (ref 6.5–8.1)

## 2014-09-21 LAB — LIPASE, BLOOD: LIPASE: 19 U/L — AB (ref 22–51)

## 2014-09-21 LAB — ETHANOL: ALCOHOL ETHYL (B): 124 mg/dL — AB (ref <5)

## 2014-09-21 MED ORDER — PREDNISONE 20 MG PO TABS: 20.0000 mg | ORAL_TABLET | Freq: Two times a day (BID) | ORAL | Status: DC

## 2014-09-21 MED ORDER — ONDANSETRON 4 MG PO TBDP: 4.0000 mg | ORAL_TABLET | Freq: Three times a day (TID) | ORAL | Status: DC | PRN

## 2014-09-21 MED ORDER — MORPHINE SULFATE 4 MG/ML IJ SOLN
4.0000 mg | INTRAMUSCULAR | Status: DC | PRN
  Administered 2014-09-21 (×2): 4 mg via INTRAVENOUS
  Filled 2014-09-21 (×2): qty 1

## 2014-09-21 MED ORDER — HYDROCODONE-ACETAMINOPHEN 5-325 MG PO TABS: 2.0000 | ORAL_TABLET | ORAL | Status: DC | PRN

## 2014-09-21 MED ORDER — SODIUM CHLORIDE 0.9 % IV BOLUS (SEPSIS)
1000.0000 mL | Freq: Once | INTRAVENOUS | Status: AC
  Administered 2014-09-21: 1000 mL via INTRAVENOUS

## 2014-09-21 MED ORDER — PANTOPRAZOLE SODIUM 40 MG IV SOLR
40.0000 mg | Freq: Once | INTRAVENOUS | Status: AC
  Administered 2014-09-21: 40 mg via INTRAVENOUS
  Filled 2014-09-21: qty 40

## 2014-09-21 MED ORDER — ONDANSETRON HCL 4 MG/2ML IJ SOLN
4.0000 mg | Freq: Once | INTRAMUSCULAR | Status: AC
  Administered 2014-09-21: 4 mg via INTRAVENOUS
  Filled 2014-09-21: qty 2

## 2014-09-21 NOTE — Discharge Instructions (Signed)
If you continue to drink alcohol you continue to have these intermittent episodes of abdominal pain related to your pancreatitis.   Abdominal Pain, Women Abdominal (stomach, pelvic, or belly) pain can be caused by many things. It is important to tell your doctor:  The location of the pain.  Does it come and go or is it present all the time?  Are there things that start the pain (eating certain foods, exercise)?  Are there other symptoms associated with the pain (fever, nausea, vomiting, diarrhea)? All of this is helpful to know when trying to find the cause of the pain. CAUSES   Stomach: virus or bacteria infection, or ulcer.  Intestine: appendicitis (inflamed appendix), regional ileitis (Crohn's disease), ulcerative colitis (inflamed colon), irritable bowel syndrome, diverticulitis (inflamed diverticulum of the colon), or cancer of the stomach or intestine.  Gallbladder disease or stones in the gallbladder.  Kidney disease, kidney stones, or infection.  Pancreas infection or cancer.  Fibromyalgia (pain disorder).  Diseases of the female organs:  Uterus: fibroid (non-cancerous) tumors or infection.  Fallopian tubes: infection or tubal pregnancy.  Ovary: cysts or tumors.  Pelvic adhesions (scar tissue).  Endometriosis (uterus lining tissue growing in the pelvis and on the pelvic organs).  Pelvic congestion syndrome (female organs filling up with blood just before the menstrual period).  Pain with the menstrual period.  Pain with ovulation (producing an egg).  Pain with an IUD (intrauterine device, birth control) in the uterus.  Cancer of the female organs.  Functional pain (pain not caused by a disease, may improve without treatment).  Psychological pain.  Depression. DIAGNOSIS  Your doctor will decide the seriousness of your pain by doing an examination.  Blood tests.  X-rays.  Ultrasound.  CT scan (computed tomography, special type of X-ray).  MRI  (magnetic resonance imaging).  Cultures, for infection.  Barium enema (dye inserted in the large intestine, to better view it with X-rays).  Colonoscopy (looking in intestine with a lighted tube).  Laparoscopy (minor surgery, looking in abdomen with a lighted tube).  Major abdominal exploratory surgery (looking in abdomen with a large incision). TREATMENT  The treatment will depend on the cause of the pain.   Many cases can be observed and treated at home.  Over-the-counter medicines recommended by your caregiver.  Prescription medicine.  Antibiotics, for infection.  Birth control pills, for painful periods or for ovulation pain.  Hormone treatment, for endometriosis.  Nerve blocking injections.  Physical therapy.  Antidepressants.  Counseling with a psychologist or psychiatrist.  Minor or major surgery. HOME CARE INSTRUCTIONS   Do not take laxatives, unless directed by your caregiver.  Take over-the-counter pain medicine only if ordered by your caregiver. Do not take aspirin because it can cause an upset stomach or bleeding.  Try a clear liquid diet (broth or water) as ordered by your caregiver. Slowly move to a bland diet, as tolerated, if the pain is related to the stomach or intestine.  Have a thermometer and take your temperature several times a day, and record it.  Bed rest and sleep, if it helps the pain.  Avoid sexual intercourse, if it causes pain.  Avoid stressful situations.  Keep your follow-up appointments and tests, as your caregiver orders.  If the pain does not go away with medicine or surgery, you may try:  Acupuncture.  Relaxation exercises (yoga, meditation).  Group therapy.  Counseling. SEEK MEDICAL CARE IF:   You notice certain foods cause stomach pain.  Your home care treatment  is not helping your pain.  You need stronger pain medicine.  You want your IUD removed.  You feel faint or lightheaded.  You develop nausea and  vomiting.  You develop a rash.  You are having side effects or an allergy to your medicine. SEEK IMMEDIATE MEDICAL CARE IF:   Your pain does not go away or gets worse.  You have a fever.  Your pain is felt only in portions of the abdomen. The right side could possibly be appendicitis. The left lower portion of the abdomen could be colitis or diverticulitis.  You are passing blood in your stools (bright red or black tarry stools, with or without vomiting).  You have blood in your urine.  You develop chills, with or without a fever.  You pass out. MAKE SURE YOU:   Understand these instructions.  Will watch your condition.  Will get help right away if you are not doing well or get worse. Document Released: 01/07/2007 Document Revised: 07/27/2013 Document Reviewed: 01/27/2009 [facility] Patient Information 2015 [place], [place]. This information is not intended to replace advice given to you by your health care provider. Make sure you discuss any questions you have with your health care provider.

## 2014-09-21 NOTE — ED Notes (Signed)
Patient reports she has had abdominal pain and nausea x 2 days, similar to previous episodes of pancreatitis.

## 2014-09-21 NOTE — ED Provider Notes (Signed)
CSN: [id number]     Arrival date & time 09/21/14  7616 History   First MD Initiated Contact with Patient 09/21/14 [phone]     Chief Complaint  Patient presents with  . Abdominal Pain      HPI  Patient presents for evaluation of epigastric pain and vomiting. Symptoms started 2 days ago. History of alcoholic pancreatitis. States she continues to drink 3 or 4 nights a week. Primarily wine. Last drink was Sunday night when symptoms started. Vomiting yesterday and pain today she presents here. No blood in her emesis. No dark stools.  Past Medical History  Diagnosis Date  . Loss of weight   . Sarcoidosis   . Leiomyoma of uterus, unspecified   . Pure hypercholesterolemia   . Obesity, unspecified   . Chronic pancreatitis   . Coronary atherosclerosis of unspecified type of vessel, native or graft   . Hypertension   . Shortness of breath     none since stent 2 yrs ago  . CHF (congestive heart failure)   . OSA (obstructive sleep apnea)     no CPAP  . Type II diabetes mellitus   . TGGYIRSW(546.2)     "weekly" (07/29/2013)   Past Surgical History  Procedure Laterality Date  . Coronary artery bypass graft  ~ 2000    CABG X3  . Tubal ligation  1997  . Hysteroscopy with novasure N/A 09/04/2012    Procedure: HYSTEROSCOPY WITH NOVASURE;  Surgeon: Mora Bellman, MD;  Location: [facility];  Service: Gynecology;  Laterality: N/A;  with removal intrauterine device  . Coronary angioplasty with stent placement  09/2009    "1"   Family History  Problem Relation Age of Onset  . Diabetes Mother   . Coronary artery disease Mother    History  Substance Use Topics  . Smoking status: Current Every Day Smoker -- 0.50 packs/day for 28 years    Types: Cigarettes  . Smokeless tobacco: Never Used  . Alcohol Use: 16.8 oz/week    28 Glasses of wine per week     Comment: 07/29/2013 "3-4 glasses of wine qd"   OB History    Gravida Para Term Preterm AB TAB SAB Ectopic Multiple Living   3 3 3       3       Review of Systems  Constitutional: Negative for fever, chills, diaphoresis, appetite change and fatigue.  HENT: Negative for mouth sores, sore throat and trouble swallowing.   Eyes: Negative for visual disturbance.  Respiratory: Negative for cough, chest tightness, shortness of breath and wheezing.   Cardiovascular: Negative for chest pain.  Gastrointestinal: Positive for nausea, vomiting and abdominal pain. Negative for diarrhea and abdominal distention.  Endocrine: Negative for polydipsia, polyphagia and polyuria.  Genitourinary: Negative for dysuria, frequency and hematuria.  Musculoskeletal: Negative for gait problem.  Skin: Negative for color change, pallor and rash.  Neurological: Negative for dizziness, syncope, light-headedness and headaches.  Hematological: Does not bruise/bleed easily.  Psychiatric/Behavioral: Negative for behavioral problems and confusion.      Allergies  Lisinopril  Home Medications   Prior to Admission medications   Medication Sig Start Date End Date Taking? Authorizing Provider  albuterol (PROVENTIL HFA;VENTOLIN HFA) 108 (90 BASE) MCG/ACT inhaler Inhale 2 puffs into the lungs every 6 (six) hours as needed for wheezing or shortness of breath. 08/13/13  Yes Collene Gobble, MD  aspirin 325 MG tablet Take 325 mg by mouth daily.    Yes Historical Provider, MD  clopidogrel (PLAVIX)  75 MG tablet Take 75 mg by mouth daily with breakfast.    Yes Historical Provider, MD  hydrochlorothiazide (HYDRODIURIL) 25 MG tablet Take 25 mg by mouth every morning.  08/15/12  Yes Oswald Hillock, MD  ibuprofen (ADVIL,MOTRIN) 200 MG tablet Take 400-600 mg by mouth every 6 (six) hours as needed for moderate pain.   Yes Historical Provider, MD  metFORMIN (GLUCOPHAGE) 500 MG tablet Take 500 mg by mouth daily with breakfast. Take with food   Yes Historical Provider, MD  metoprolol (LOPRESSOR) 50 MG tablet Take 50 mg by mouth daily. Verified Metoprolol Tartrate, pt only takes once  daily   Yes Historical Provider, MD  omeprazole (PRILOSEC) 40 MG capsule Take 1 capsule (40 mg total) by mouth daily. 07/31/13  Yes Debbe Odea, MD  pravastatin (PRAVACHOL) 20 MG tablet Take 20 mg by mouth daily.    Yes Historical Provider, MD  HYDROcodone-acetaminophen (NORCO/VICODIN) 5-325 MG per tablet Take 2 tablets by mouth every 4 (four) hours as needed. 09/21/14   Tanna Furry, MD  ondansetron (ZOFRAN ODT) 4 MG disintegrating tablet Take 1 tablet (4 mg total) by mouth every 8 (eight) hours as needed for nausea. 09/21/14   Tanna Furry, MD  ondansetron (ZOFRAN) 4 MG tablet Take 1 tablet (4 mg total) by mouth every 6 (six) hours. Patient not taking: Reported on 08/04/2014 05/30/14   Delos Haring, PA-C  oxyCODONE-acetaminophen (PERCOCET/ROXICET) 5-325 MG per tablet Take 1-2 tablets by mouth every 4 (four) hours as needed for severe pain. Patient not taking: Reported on 09/21/2014 08/04/14   Alvina Chou, PA-C  predniSONE (DELTASONE) 20 MG tablet Take 1 tablet (20 mg total) by mouth 2 (two) times daily with a meal. 09/21/14   Tanna Furry, MD   BP 126/90 mmHg  Pulse 105  Temp(Src) 98.2 F (36.8 C) (Axillary)  Resp 18  Ht 5\' 2"  (1.575 m)  Wt 146 lb (66.225 kg)  BMI 26.70 kg/m2  SpO2 99%  LMP 08/15/2012 Physical Exam  Constitutional: She is oriented to person, place, and time. She appears well-developed and well-nourished. No distress.  HENT:  Head: Normocephalic.  Eyes: Conjunctivae are normal. Pupils are equal, round, and reactive to light. No scleral icterus.  Neck: Normal range of motion. Neck supple. No thyromegaly present.  Cardiovascular: Normal rate and regular rhythm.  Exam reveals no gallop and no friction rub.   No murmur heard. Pulmonary/Chest: Effort normal and breath sounds normal. No respiratory distress. She has no wheezes. She has no rales.  Abdominal: Soft. Bowel sounds are normal. She exhibits no distension. There is no tenderness. There is no rebound.    Musculoskeletal:  Normal range of motion.  Neurological: She is alert and oriented to person, place, and time.  Skin: Skin is warm and dry. No rash noted.  Psychiatric: She has a normal mood and affect. Her behavior is normal.    ED Course  Procedures (including critical care time) Labs Review Labs Reviewed  CBC WITH DIFFERENTIAL/PLATELET - Abnormal; Notable for the following:    Hemoglobin 15.2 (*)    All other components within normal limits  COMPREHENSIVE METABOLIC PANEL - Abnormal; Notable for the following:    CO2 20 (*)    Glucose, Bld 112 (*)    Calcium 8.8 (*)    AST 52 (*)    All other components within normal limits  LIPASE, BLOOD - Abnormal; Notable for the following:    Lipase 19 (*)    All other components within normal limits  ETHANOL - Abnormal; Notable for the following:    Alcohol, Ethyl (B) 124 (*)    All other components within normal limits    Imaging Review No results found.   EKG Interpretation None      MDM   Final diagnoses:  Abdominal pain, chronic, epigastric  Gastritis  Alcohol-induced chronic pancreatitis    Reassuring labs. Symptom control after IV meds. Plan is home. I stressed abscess with her. She seems quite disinterested.    Tanna Furry, MD 09/21/14 [phone]

## 2014-11-17 ENCOUNTER — Emergency Department (HOSPITAL_BASED_OUTPATIENT_CLINIC_OR_DEPARTMENT_OTHER)
Admission: EM | Admit: 2014-11-17 | Discharge: 2014-11-17 | Disposition: A | Discharge status: Home / Self Care | Payer: 59 | Attending: Emergency Medicine | Admitting: Emergency Medicine

## 2014-11-17 ENCOUNTER — Encounter (HOSPITAL_BASED_OUTPATIENT_CLINIC_OR_DEPARTMENT_OTHER): Payer: Self-pay

## 2014-11-17 DIAGNOSIS — Z720 Tobacco use: Secondary | ICD-10-CM | POA: Insufficient documentation

## 2014-11-17 DIAGNOSIS — Z7952 Long term (current) use of systemic steroids: Secondary | ICD-10-CM | POA: Insufficient documentation

## 2014-11-17 DIAGNOSIS — Z86018 Personal history of other benign neoplasm: Secondary | ICD-10-CM | POA: Diagnosis not present

## 2014-11-17 DIAGNOSIS — Z8719 Personal history of other diseases of the digestive system: Secondary | ICD-10-CM | POA: Insufficient documentation

## 2014-11-17 DIAGNOSIS — E119 Type 2 diabetes mellitus without complications: Secondary | ICD-10-CM | POA: Insufficient documentation

## 2014-11-17 DIAGNOSIS — R1011 Right upper quadrant pain: Secondary | ICD-10-CM | POA: Insufficient documentation

## 2014-11-17 DIAGNOSIS — E669 Obesity, unspecified: Secondary | ICD-10-CM | POA: Diagnosis not present

## 2014-11-17 DIAGNOSIS — Z79899 Other long term (current) drug therapy: Secondary | ICD-10-CM | POA: Diagnosis not present

## 2014-11-17 DIAGNOSIS — R110 Nausea: Secondary | ICD-10-CM | POA: Diagnosis not present

## 2014-11-17 DIAGNOSIS — I2510 Atherosclerotic heart disease of native coronary artery without angina pectoris: Secondary | ICD-10-CM | POA: Insufficient documentation

## 2014-11-17 DIAGNOSIS — E780 Pure hypercholesterolemia: Secondary | ICD-10-CM | POA: Insufficient documentation

## 2014-11-17 DIAGNOSIS — I10 Essential (primary) hypertension: Secondary | ICD-10-CM | POA: Diagnosis not present

## 2014-11-17 DIAGNOSIS — Z862 Personal history of diseases of the blood and blood-forming organs and certain disorders involving the immune mechanism: Secondary | ICD-10-CM | POA: Insufficient documentation

## 2014-11-17 DIAGNOSIS — Z3202 Encounter for pregnancy test, result negative: Secondary | ICD-10-CM | POA: Insufficient documentation

## 2014-11-17 DIAGNOSIS — Z7982 Long term (current) use of aspirin: Secondary | ICD-10-CM | POA: Insufficient documentation

## 2014-11-17 DIAGNOSIS — Z8669 Personal history of other diseases of the nervous system and sense organs: Secondary | ICD-10-CM | POA: Diagnosis not present

## 2014-11-17 DIAGNOSIS — Z9861 Coronary angioplasty status: Secondary | ICD-10-CM | POA: Insufficient documentation

## 2014-11-17 DIAGNOSIS — Z951 Presence of aortocoronary bypass graft: Secondary | ICD-10-CM | POA: Diagnosis not present

## 2014-11-17 DIAGNOSIS — R1013 Epigastric pain: Secondary | ICD-10-CM | POA: Insufficient documentation

## 2014-11-17 DIAGNOSIS — R1012 Left upper quadrant pain: Secondary | ICD-10-CM | POA: Diagnosis not present

## 2014-11-17 DIAGNOSIS — I509 Heart failure, unspecified: Secondary | ICD-10-CM | POA: Diagnosis not present

## 2014-11-17 DIAGNOSIS — R630 Anorexia: Secondary | ICD-10-CM | POA: Diagnosis not present

## 2014-11-17 LAB — CBC WITH DIFFERENTIAL/PLATELET
BASOS ABS: 0 10*3/uL (ref 0.0–0.1)
BASOS PCT: 0 % (ref 0–1)
EOS ABS: 0 10*3/uL (ref 0.0–0.7)
Eosinophils Relative: 1 % (ref 0–5)
HCT: 45.1 % (ref 36.0–46.0)
HEMOGLOBIN: 15.3 g/dL — AB (ref 12.0–15.0)
Lymphocytes Relative: 24 % (ref 12–46)
Lymphs Abs: 1.4 10*3/uL (ref 0.7–4.0)
MCH: 31.5 pg (ref 26.0–34.0)
MCHC: 33.9 g/dL (ref 30.0–36.0)
MCV: 93 fL (ref 78.0–100.0)
Monocytes Absolute: 0.6 10*3/uL (ref 0.1–1.0)
Monocytes Relative: 11 % (ref 3–12)
NEUTROS PCT: 64 % (ref 43–77)
Neutro Abs: 3.9 10*3/uL (ref 1.7–7.7)
Platelets: 207 10*3/uL (ref 150–400)
RBC: 4.85 MIL/uL (ref 3.87–5.11)
RDW: 11.7 % (ref 11.5–15.5)
WBC: 6 10*3/uL (ref 4.0–10.5)

## 2014-11-17 LAB — URINALYSIS, ROUTINE W REFLEX MICROSCOPIC
BILIRUBIN URINE: NEGATIVE
Glucose, UA: NEGATIVE mg/dL
Hgb urine dipstick: NEGATIVE
KETONES UR: 15 mg/dL — AB
Leukocytes, UA: NEGATIVE
NITRITE: NEGATIVE
Protein, ur: NEGATIVE mg/dL
Specific Gravity, Urine: 1.026 (ref 1.005–1.030)
UROBILINOGEN UA: 0.2 mg/dL (ref 0.0–1.0)
pH: 5.5 (ref 5.0–8.0)

## 2014-11-17 LAB — PREGNANCY, URINE: PREG TEST UR: NEGATIVE

## 2014-11-17 LAB — COMPREHENSIVE METABOLIC PANEL
ALBUMIN: 3.5 g/dL (ref 3.5–5.0)
ALK PHOS: 59 U/L (ref 38–126)
ALT: 35 U/L (ref 14–54)
ANION GAP: 9 (ref 5–15)
AST: 49 U/L — ABNORMAL HIGH (ref 15–41)
BUN: 13 mg/dL (ref 6–20)
CALCIUM: 9.5 mg/dL (ref 8.9–10.3)
CO2: 25 mmol/L (ref 22–32)
Chloride: 104 mmol/L (ref 101–111)
Creatinine, Ser: 0.64 mg/dL (ref 0.44–1.00)
GFR calc Af Amer: >60 mL/min (ref >60)
GFR calc non Af Amer: >60 mL/min (ref >60)
GLUCOSE: 83 mg/dL (ref 65–99)
Potassium: 3.9 mmol/L (ref 3.5–5.1)
SODIUM: 138 mmol/L (ref 135–145)
Total Bilirubin: 0.7 mg/dL (ref 0.3–1.2)
Total Protein: 6.7 g/dL (ref 6.5–8.1)

## 2014-11-17 LAB — LIPASE, BLOOD: Lipase: 26 U/L (ref 22–51)

## 2014-11-17 MED ORDER — SODIUM CHLORIDE 0.9 % IV SOLN: 1000.0000 mL | INTRAVENOUS | Status: DC

## 2014-11-17 MED ORDER — HYDROMORPHONE HCL 1 MG/ML IJ SOLN
1.0000 mg | INTRAMUSCULAR | Status: DC | PRN
  Administered 2014-11-17: 1 mg via INTRAVENOUS
  Filled 2014-11-17: qty 1

## 2014-11-17 MED ORDER — ONDANSETRON HCL 4 MG/2ML IJ SOLN
4.0000 mg | Freq: Once | INTRAMUSCULAR | Status: AC
  Administered 2014-11-17: 4 mg via INTRAVENOUS
  Filled 2014-11-17: qty 2

## 2014-11-17 MED ORDER — SODIUM CHLORIDE 0.9 % IV SOLN
1000.0000 mL | Freq: Once | INTRAVENOUS | Status: AC
  Administered 2014-11-17: 1000 mL via INTRAVENOUS

## 2014-11-17 MED ORDER — HYDROCODONE-ACETAMINOPHEN 5-325 MG PO TABS: 1.0000 | ORAL_TABLET | ORAL | Status: DC | PRN

## 2014-11-17 MED ORDER — ONDANSETRON HCL 4 MG PO TABS: 4.0000 mg | ORAL_TABLET | Freq: Four times a day (QID) | ORAL | Status: DC

## 2014-11-17 NOTE — ED Notes (Signed)
See downtime charting. 

## 2014-11-17 NOTE — ED Provider Notes (Signed)
CSN: [id number]     Arrival date & time 11/17/14  1506 History   First MD Initiated Contact with Patient 11/17/14 1652     Chief Complaint  Patient presents with  . Abdominal Pain   Patient is a 49 y.o. female presenting with abdominal pain. The history is provided by the patient.  Abdominal Pain Pain location:  LUQ, RUQ and epigastric Pain quality: aching and cramping   Pain radiates to:  Does not radiate Pain severity:  Moderate Onset quality:  Gradual Duration:  3 days Timing:  Constant Progression:  Worsening Chronicity:  Recurrent (Patient has history of having pancreatitis in the past. This feels similar) Context comment:  Patient does not drink alcohol like she used to. She did have some alcoholic beverages this past weekend. Relieved by:  Nothing Ineffective treatments:  None tried Associated symptoms: anorexia and nausea   Associated symptoms: no chest pain, no cough, no dysuria, no fever, no shortness of breath and no vomiting     Past Medical History  Diagnosis Date  . Loss of weight   . Sarcoidosis   . Leiomyoma of uterus, unspecified   . Pure hypercholesterolemia   . Obesity, unspecified   . Chronic pancreatitis   . Coronary atherosclerosis of unspecified type of vessel, native or graft   . Hypertension   . Shortness of breath     none since stent 2 yrs ago  . CHF (congestive heart failure)   . OSA (obstructive sleep apnea)     no CPAP  . Type II diabetes mellitus   . HERDEYCX(448.1)     "weekly" (07/29/2013)   Past Surgical History  Procedure Laterality Date  . Coronary artery bypass graft  ~ 2000    CABG X3  . Tubal ligation  1997  . Hysteroscopy with novasure N/A 09/04/2012    Procedure: HYSTEROSCOPY WITH NOVASURE;  Surgeon: Mora Bellman, MD;  Location: [facility];  Service: Gynecology;  Laterality: N/A;  with removal intrauterine device  . Coronary angioplasty with stent placement  09/2009    "1"   Family History  Problem Relation Age of Onset  .  Diabetes Mother   . Coronary artery disease Mother    Social History  Substance Use Topics  . Smoking status: Current Every Day Smoker -- 0.50 packs/day for 28 years    Types: Cigarettes  . Smokeless tobacco: Never Used  . Alcohol Use: 16.8 oz/week    28 Glasses of wine per week     Comment: 07/29/2013 "3-4 glasses of wine qd"   OB History    Gravida Para Term Preterm AB TAB SAB Ectopic Multiple Living   3 3 3       3      Review of Systems  Constitutional: Negative for fever.  Respiratory: Negative for cough and shortness of breath.   Cardiovascular: Negative for chest pain.  Gastrointestinal: Positive for nausea, abdominal pain and anorexia. Negative for vomiting.  Genitourinary: Negative for dysuria.  All other systems reviewed and are negative.     Allergies  Lisinopril  Home Medications   Prior to Admission medications   Medication Sig Start Date End Date Taking? Authorizing Provider  albuterol (PROVENTIL HFA;VENTOLIN HFA) 108 (90 BASE) MCG/ACT inhaler Inhale 2 puffs into the lungs every 6 (six) hours as needed for wheezing or shortness of breath. 08/13/13   Collene Gobble, MD  aspirin 325 MG tablet Take 325 mg by mouth daily.     Historical Provider, MD  clopidogrel (  PLAVIX) 75 MG tablet Take 75 mg by mouth daily with breakfast.     Historical Provider, MD  hydrochlorothiazide (HYDRODIURIL) 25 MG tablet Take 25 mg by mouth every morning.  08/15/12   Oswald Hillock, MD  HYDROcodone-acetaminophen (NORCO/VICODIN) 5-325 MG per tablet Take 1-2 tablets by mouth every 4 (four) hours as needed. 11/17/14   Dorie Rank, MD  ibuprofen (ADVIL,MOTRIN) 200 MG tablet Take 400-600 mg by mouth every 6 (six) hours as needed for moderate pain.    Historical Provider, MD  metFORMIN (GLUCOPHAGE) 500 MG tablet Take 500 mg by mouth daily with breakfast. Take with food    Historical Provider, MD  metoprolol (LOPRESSOR) 50 MG tablet Take 50 mg by mouth daily. Verified Metoprolol Tartrate, pt only takes  once daily    Historical Provider, MD  omeprazole (PRILOSEC) 40 MG capsule Take 1 capsule (40 mg total) by mouth daily. 07/31/13   Debbe Odea, MD  ondansetron (ZOFRAN ODT) 4 MG disintegrating tablet Take 1 tablet (4 mg total) by mouth every 8 (eight) hours as needed for nausea. 09/21/14   Tanna Furry, MD  ondansetron (ZOFRAN) 4 MG tablet Take 1 tablet (4 mg total) by mouth every 6 (six) hours. 11/17/14   Dorie Rank, MD  pravastatin (PRAVACHOL) 20 MG tablet Take 20 mg by mouth daily.     Historical Provider, MD  predniSONE (DELTASONE) 20 MG tablet Take 1 tablet (20 mg total) by mouth 2 (two) times daily with a meal. 09/21/14   Tanna Furry, MD   BP 132/78 mmHg  Pulse 74  Temp(Src) 98.3 F (36.8 C) (Oral)  Resp 18  SpO2 92%  LMP 08/15/2012 Physical Exam  Constitutional: She appears well-developed and well-nourished. No distress.  HENT:  Head: Normocephalic and atraumatic.  Right Ear: External ear normal.  Left Ear: External ear normal.  Eyes: Conjunctivae are normal. Right eye exhibits no discharge. Left eye exhibits no discharge. No scleral icterus.  Neck: Neck supple. No tracheal deviation present.  Cardiovascular: Normal rate, regular rhythm and intact distal pulses.   Pulmonary/Chest: Effort normal and breath sounds normal. No stridor. No respiratory distress. She has no wheezes. She has no rales.  Abdominal: Soft. Bowel sounds are normal. She exhibits no distension. There is tenderness in the right upper quadrant, epigastric area and left upper quadrant. There is no rigidity, no rebound and no guarding. No hernia.  Musculoskeletal: She exhibits no edema or tenderness.  Neurological: She is alert. She has normal strength. No cranial nerve deficit (no facial droop, extraocular movements intact, no slurred speech) or sensory deficit. She exhibits normal muscle tone. She displays no seizure activity. Coordination normal.  Skin: Skin is warm and dry. No rash noted.  Psychiatric: She has a normal  mood and affect.  Nursing note and vitals reviewed.   ED Course  Procedures (including critical care time) Labs Review Labs Reviewed  URINALYSIS, ROUTINE W REFLEX MICROSCOPIC (NOT AT [facility]) - Abnormal; Notable for the following:    Ketones, ur 15 (*)    All other components within normal limits  CBC WITH DIFFERENTIAL/PLATELET - Abnormal; Notable for the following:    Hemoglobin 15.3 (*)    All other components within normal limits  COMPREHENSIVE METABOLIC PANEL - Abnormal; Notable for the following:    AST 49 (*)    All other components within normal limits  PREGNANCY, URINE  LIPASE, BLOOD     MDM   Final diagnoses:  Epigastric pain    Patient's laboratory tests are  reassuring. Reviewed her old CT scans on file and she did have a calcified pancreas in the past. His possible the patient may have some symptoms associated with pancreatitis despite her normal lipase. However, she is not having any vomiting or pain has improved with treatment. I think it's reasonable to try an outpatient course of pain medications, liquid diet and medications for nausea. I stressed the importance of not drinking any alcohol whatsoever. Patient understands to return to the emergency room for worsening symptoms.    Dorie Rank, MD 11/17/14 2007

## 2014-11-17 NOTE — ED Notes (Signed)
C/o left side abd pain-feels like pancreatitis

## 2014-11-17 NOTE — Discharge Instructions (Signed)

## 2015-01-27 ENCOUNTER — Emergency Department (HOSPITAL_COMMUNITY): Payer: 59

## 2015-01-27 ENCOUNTER — Encounter (HOSPITAL_COMMUNITY): Payer: Self-pay | Admitting: Emergency Medicine

## 2015-01-27 ENCOUNTER — Emergency Department (HOSPITAL_COMMUNITY)
Admission: EM | Admit: 2015-01-27 | Discharge: 2015-01-28 | Disposition: A | Discharge status: Home / Self Care | Payer: 59 | Attending: Emergency Medicine | Admitting: Emergency Medicine

## 2015-01-27 DIAGNOSIS — Z720 Tobacco use: Secondary | ICD-10-CM | POA: Diagnosis not present

## 2015-01-27 DIAGNOSIS — Z86012 Personal history of benign carcinoid tumor: Secondary | ICD-10-CM | POA: Insufficient documentation

## 2015-01-27 DIAGNOSIS — Z8719 Personal history of other diseases of the digestive system: Secondary | ICD-10-CM | POA: Insufficient documentation

## 2015-01-27 DIAGNOSIS — I2510 Atherosclerotic heart disease of native coronary artery without angina pectoris: Secondary | ICD-10-CM | POA: Insufficient documentation

## 2015-01-27 DIAGNOSIS — R109 Unspecified abdominal pain: Secondary | ICD-10-CM | POA: Diagnosis present

## 2015-01-27 DIAGNOSIS — I10 Essential (primary) hypertension: Secondary | ICD-10-CM | POA: Diagnosis not present

## 2015-01-27 DIAGNOSIS — R1011 Right upper quadrant pain: Secondary | ICD-10-CM | POA: Insufficient documentation

## 2015-01-27 DIAGNOSIS — E7800 Pure hypercholesterolemia, unspecified: Secondary | ICD-10-CM | POA: Diagnosis not present

## 2015-01-27 DIAGNOSIS — Z7902 Long term (current) use of antithrombotics/antiplatelets: Secondary | ICD-10-CM | POA: Insufficient documentation

## 2015-01-27 DIAGNOSIS — E669 Obesity, unspecified: Secondary | ICD-10-CM | POA: Insufficient documentation

## 2015-01-27 DIAGNOSIS — Z8669 Personal history of other diseases of the nervous system and sense organs: Secondary | ICD-10-CM | POA: Insufficient documentation

## 2015-01-27 DIAGNOSIS — Z951 Presence of aortocoronary bypass graft: Secondary | ICD-10-CM | POA: Insufficient documentation

## 2015-01-27 DIAGNOSIS — R112 Nausea with vomiting, unspecified: Secondary | ICD-10-CM | POA: Insufficient documentation

## 2015-01-27 DIAGNOSIS — K819 Cholecystitis, unspecified: Secondary | ICD-10-CM

## 2015-01-27 DIAGNOSIS — E119 Type 2 diabetes mellitus without complications: Secondary | ICD-10-CM | POA: Diagnosis not present

## 2015-01-27 DIAGNOSIS — Z79899 Other long term (current) drug therapy: Secondary | ICD-10-CM | POA: Diagnosis not present

## 2015-01-27 DIAGNOSIS — I509 Heart failure, unspecified: Secondary | ICD-10-CM | POA: Diagnosis not present

## 2015-01-27 DIAGNOSIS — Z7982 Long term (current) use of aspirin: Secondary | ICD-10-CM | POA: Insufficient documentation

## 2015-01-27 DIAGNOSIS — Z862 Personal history of diseases of the blood and blood-forming organs and certain disorders involving the immune mechanism: Secondary | ICD-10-CM | POA: Diagnosis not present

## 2015-01-27 DIAGNOSIS — Z9851 Tubal ligation status: Secondary | ICD-10-CM | POA: Insufficient documentation

## 2015-01-27 DIAGNOSIS — Z3202 Encounter for pregnancy test, result negative: Secondary | ICD-10-CM | POA: Insufficient documentation

## 2015-01-27 LAB — URINALYSIS, ROUTINE W REFLEX MICROSCOPIC
Bilirubin Urine: NEGATIVE
GLUCOSE, UA: NEGATIVE mg/dL
Hgb urine dipstick: NEGATIVE
KETONES UR: NEGATIVE mg/dL
LEUKOCYTES UA: NEGATIVE
NITRITE: NEGATIVE
PH: 5 (ref 5.0–8.0)
Protein, ur: NEGATIVE mg/dL
Specific Gravity, Urine: 1.007 (ref 1.005–1.030)
Urobilinogen, UA: 0.2 mg/dL (ref 0.0–1.0)

## 2015-01-27 LAB — CBC
HEMATOCRIT: 46.5 % — AB (ref 36.0–46.0)
HEMOGLOBIN: 16 g/dL — AB (ref 12.0–15.0)
MCH: 32.6 pg (ref 26.0–34.0)
MCHC: 34.4 g/dL (ref 30.0–36.0)
MCV: 94.7 fL (ref 78.0–100.0)
Platelets: 236 10*3/uL (ref 150–400)
RBC: 4.91 MIL/uL (ref 3.87–5.11)
RDW: 12.2 % (ref 11.5–15.5)
WBC: 9.6 10*3/uL (ref 4.0–10.5)

## 2015-01-27 LAB — COMPREHENSIVE METABOLIC PANEL
ALT: 34 U/L (ref 14–54)
ANION GAP: 10 (ref 5–15)
AST: 46 U/L — ABNORMAL HIGH (ref 15–41)
Albumin: 3.8 g/dL (ref 3.5–5.0)
Alkaline Phosphatase: 66 U/L (ref 38–126)
BUN: 14 mg/dL (ref 6–20)
CHLORIDE: 98 mmol/L — AB (ref 101–111)
CO2: 24 mmol/L (ref 22–32)
Calcium: 9.1 mg/dL (ref 8.9–10.3)
Creatinine, Ser: 0.82 mg/dL (ref 0.44–1.00)
Glucose, Bld: 126 mg/dL — ABNORMAL HIGH (ref 65–99)
POTASSIUM: 3.4 mmol/L — AB (ref 3.5–5.1)
SODIUM: 132 mmol/L — AB (ref 135–145)
Total Bilirubin: 0.9 mg/dL (ref 0.3–1.2)
Total Protein: 7.2 g/dL (ref 6.5–8.1)

## 2015-01-27 LAB — LIPASE, BLOOD: LIPASE: 32 U/L (ref 11–51)

## 2015-01-27 MED ORDER — ONDANSETRON HCL 4 MG/2ML IJ SOLN
4.0000 mg | Freq: Once | INTRAMUSCULAR | Status: AC
  Administered 2015-01-27: 4 mg via INTRAVENOUS
  Filled 2015-01-27: qty 2

## 2015-01-27 MED ORDER — HYDROMORPHONE HCL 1 MG/ML IJ SOLN
1.0000 mg | Freq: Once | INTRAMUSCULAR | Status: AC
  Administered 2015-01-27: 1 mg via INTRAVENOUS
  Filled 2015-01-27: qty 1

## 2015-01-27 MED ORDER — SODIUM CHLORIDE 0.9 % IV BOLUS (SEPSIS)
1000.0000 mL | Freq: Once | INTRAVENOUS | Status: AC
  Administered 2015-01-27: 1000 mL via INTRAVENOUS

## 2015-01-27 NOTE — ED Notes (Signed)
Patient states she is here because her pancreas is flairing up. Patient has hx of pancreatitis. Patient states she has right sided abd pain and vomiting. Patient reports 3 episodes of emesis in past 24 hours.

## 2015-01-27 NOTE — ED Notes (Signed)
Pt ambulated to restroom with steady gait.

## 2015-01-28 ENCOUNTER — Encounter (HOSPITAL_COMMUNITY): Payer: Self-pay

## 2015-01-28 ENCOUNTER — Emergency Department (HOSPITAL_COMMUNITY): Payer: 59

## 2015-01-28 LAB — PREGNANCY, URINE: PREG TEST UR: NEGATIVE

## 2015-01-28 MED ORDER — SODIUM CHLORIDE 0.9 % IV SOLN
Freq: Once | INTRAVENOUS | Status: AC
  Administered 2015-01-28: 01:00:00 via INTRAVENOUS

## 2015-01-28 MED ORDER — HYDROCODONE-ACETAMINOPHEN 5-325 MG PO TABS: 1.0000 | ORAL_TABLET | ORAL | Status: DC | PRN

## 2015-01-28 MED ORDER — FENTANYL CITRATE (PF) 100 MCG/2ML IJ SOLN
50.0000 ug | Freq: Once | INTRAMUSCULAR | Status: AC
  Administered 2015-01-28: 50 ug via INTRAVENOUS
  Filled 2015-01-28: qty 2

## 2015-01-28 MED ORDER — ONDANSETRON HCL 4 MG PO TABS: 4.0000 mg | ORAL_TABLET | Freq: Four times a day (QID) | ORAL | Status: DC

## 2015-01-28 MED ORDER — SODIUM CHLORIDE 0.9 % IV BOLUS (SEPSIS)
1000.0000 mL | Freq: Once | INTRAVENOUS | Status: AC
  Administered 2015-01-28: 1000 mL via INTRAVENOUS

## 2015-01-28 MED ORDER — ONDANSETRON HCL 4 MG/2ML IJ SOLN
4.0000 mg | Freq: Once | INTRAMUSCULAR | Status: AC
  Administered 2015-01-28: 4 mg via INTRAVENOUS
  Filled 2015-01-28: qty 2

## 2015-01-28 MED ORDER — IOHEXOL 300 MG/ML  SOLN
100.0000 mL | Freq: Once | INTRAMUSCULAR | Status: AC | PRN
  Administered 2015-01-28: 100 mL via INTRAVENOUS

## 2015-01-28 MED ORDER — HYDROMORPHONE HCL 1 MG/ML IJ SOLN: 1.0000 mg | Freq: Once | INTRAMUSCULAR | Status: DC

## 2015-01-28 NOTE — Discharge Instructions (Signed)

## 2015-01-28 NOTE — ED Notes (Signed)
Pt has small hive just above IV site. Pt denies itching or shortness of breath. Will continue to monitor. Pt was placed on 2 L [place] for SPOS in mid 80's.

## 2015-01-28 NOTE — ED Provider Notes (Signed)
CSN: [id number]     Arrival date & time 01/27/15  2030 History   First MD Initiated Contact with Patient 01/27/15 2157     Chief Complaint  Patient presents with  . Pancreatitis    hx of same     (Consider location/radiation/quality/duration/timing/severity/associated sxs/prior Treatment) HPI Marie [name] is a 49 y.o. female with hx of sarcoidosis, chronic pancreatitis, htn, CHF, tyle II DM, presents to ED with complaint of abdominal pain. Patient states that she developed symptoms yesterday. Pain is in the right abdomen, persistent nausea vomiting. She states she was able to keep some fluids down today, unable to keep any solids down. History of pancreatitis, states feels the same. She also reports several episodes of loose greasy stools. Denies any blood in her stool or emesis. Denies any fever or chills. Has taken Tums, no other medications taken for her symptoms. Denies any back pain. Denies any urinary symptoms. No history of prior abdominal surgeries.  Past Medical History  Diagnosis Date  . Loss of weight   . Sarcoidosis ([place])   . Leiomyoma of uterus, unspecified   . Pure hypercholesterolemia   . Obesity, unspecified   . Chronic pancreatitis ([place])   . Coronary atherosclerosis of unspecified type of vessel, native or graft   . Hypertension   . Shortness of breath     none since stent 2 yrs ago  . CHF (congestive heart failure) ([place])   . OSA (obstructive sleep apnea)     no CPAP  . Type II diabetes mellitus ([place])   . TIWPYKDX(833.8)     "weekly" (07/29/2013)   Past Surgical History  Procedure Laterality Date  . Coronary artery bypass graft  ~ 2000    CABG X3  . Tubal ligation  1997  . Hysteroscopy with novasure N/A 09/04/2012    Procedure: HYSTEROSCOPY WITH NOVASURE;  Surgeon: Mora Bellman, MD;  Location: [facility];  Service: Gynecology;  Laterality: N/A;  with removal intrauterine device  . Coronary angioplasty with stent placement  09/2009    "1"   Family History   Problem Relation Age of Onset  . Diabetes Mother   . Coronary artery disease Mother    Social History  Substance Use Topics  . Smoking status: Current Every Day Smoker -- 0.50 packs/day for 28 years    Types: Cigarettes  . Smokeless tobacco: Never Used  . Alcohol Use: 16.8 oz/week    28 Glasses of wine per week     Comment: 07/29/2013 "3-4 glasses of wine qd", denies 01/27/2015   OB History    Gravida Para Term Preterm AB TAB SAB Ectopic Multiple Living   3 3 3       3      Review of Systems  Constitutional: Negative for fever and chills.  Respiratory: Negative for cough, chest tightness and shortness of breath.   Cardiovascular: Negative for chest pain, palpitations and leg swelling.  Gastrointestinal: Positive for nausea, vomiting and abdominal pain. Negative for diarrhea.  Genitourinary: Negative for dysuria, flank pain and pelvic pain.  Musculoskeletal: Negative for myalgias, arthralgias, neck pain and neck stiffness.  Skin: Negative for rash.  Neurological: Negative for dizziness, weakness and headaches.  All other systems reviewed and are negative.     Allergies  Lisinopril  Home Medications   Prior to Admission medications   Medication Sig Start Date End Date Taking? Authorizing Provider  albuterol (PROVENTIL HFA;VENTOLIN HFA) 108 (90 BASE) MCG/ACT inhaler Inhale 2 puffs into the lungs every 6 (six)  hours as needed for wheezing or shortness of breath. 08/13/13  Yes Collene Gobble, MD  aspirin 325 MG tablet Take 325 mg by mouth daily.    Yes Historical Provider, MD  clopidogrel (PLAVIX) 75 MG tablet Take 75 mg by mouth daily with breakfast.    Yes Historical Provider, MD  hydrochlorothiazide (HYDRODIURIL) 25 MG tablet Take 25 mg by mouth every morning.  08/15/12  Yes Oswald Hillock, MD  ibuprofen (ADVIL,MOTRIN) 200 MG tablet Take 400-600 mg by mouth every 6 (six) hours as needed for moderate pain.   Yes Historical Provider, MD  metFORMIN (GLUCOPHAGE) 500 MG tablet Take 500  mg by mouth daily with breakfast. Take with food   Yes Historical Provider, MD  metoprolol (LOPRESSOR) 50 MG tablet Take 50 mg by mouth daily. Verified Metoprolol Tartrate, pt only takes once daily   Yes Historical Provider, MD  ondansetron (ZOFRAN) 4 MG tablet Take 1 tablet (4 mg total) by mouth every 6 (six) hours. 11/17/14  Yes Dorie Rank, MD  pravastatin (PRAVACHOL) 20 MG tablet Take 20 mg by mouth daily.    Yes Historical Provider, MD  HYDROcodone-acetaminophen (NORCO/VICODIN) 5-325 MG per tablet Take 1-2 tablets by mouth every 4 (four) hours as needed. Patient not taking: Reported on 01/27/2015 11/17/14   Dorie Rank, MD  omeprazole (PRILOSEC) 40 MG capsule Take 1 capsule (40 mg total) by mouth daily. Patient not taking: Reported on 01/27/2015 07/31/13   Debbe Odea, MD  predniSONE (DELTASONE) 20 MG tablet Take 1 tablet (20 mg total) by mouth 2 (two) times daily with a meal. Patient not taking: Reported on 01/27/2015 09/21/14   Tanna Furry, MD   BP 96/63 mmHg  Pulse 71  Temp(Src) 98 F (36.7 C) (Oral)  Resp 16  SpO2 91% Physical Exam  Constitutional: She is oriented to person, place, and time. She appears well-developed and well-nourished. No distress.  HENT:  Head: Normocephalic.  Eyes: Conjunctivae are normal.  Neck: Neck supple.  Cardiovascular: Normal rate, regular rhythm and normal heart sounds.   Pulmonary/Chest: Effort normal and breath sounds normal. No respiratory distress. She has no wheezes. She has no rales.  Abdominal: Soft. Bowel sounds are normal. She exhibits no distension. There is tenderness. There is no rebound.  Right upper quadrant, right lower quadrant tenderness  Musculoskeletal: She exhibits no edema.  Neurological: She is alert and oriented to person, place, and time.  Skin: Skin is warm and dry.  Psychiatric: She has a normal mood and affect. Her behavior is normal.  Nursing note and vitals reviewed.   ED Course  Procedures (including critical care time) Labs  Review Labs Reviewed  COMPREHENSIVE METABOLIC PANEL - Abnormal; Notable for the following:    Sodium 132 (*)    Potassium 3.4 (*)    Chloride 98 (*)    Glucose, Bld 126 (*)    AST 46 (*)    All other components within normal limits  CBC - Abnormal; Notable for the following:    Hemoglobin 16.0 (*)    HCT 46.5 (*)    All other components within normal limits  URINALYSIS, ROUTINE W REFLEX MICROSCOPIC (NOT AT [facility]) - Abnormal; Notable for the following:    APPearance CLOUDY (*)    All other components within normal limits  LIPASE, BLOOD  PREGNANCY, URINE  POC URINE PREG, ED    Imaging Review Ct Abdomen Pelvis W Contrast  01/28/2015  CLINICAL DATA:  History of pancreatitis. Current right-sided abdominal pain and vomiting. Right  lower quadrant pain. EXAM: CT ABDOMEN AND PELVIS WITH CONTRAST TECHNIQUE: Multidetector CT imaging of the abdomen and pelvis was performed using the standard protocol following bolus administration of intravenous contrast. CONTRAST:  114mL OMNIPAQUE IOHEXOL 300 MG/ML  SOLN COMPARISON:  08/16/2009 FINDINGS: Nodular scarring in the left lung base is similar to prior study consistent with benign etiology. Atelectasis and mild interstitial fibrosis in the lung bases. Mild thickening in the esophageal wall which may indicate reflux disease. Diffuse calcification throughout the pancreas consistent with chronic pancreatitis. This demonstrates significant progression throughout the pancreas since previous study. No pancreatic inflammation or ductal dilatation. No peripancreatic fluid collections. Diffuse fatty infiltration of the liver. Gallbladder wall is mildly thickened with some pericholecystic edema. This may indicate cholecystitis although no gallstones are visualized. No bile duct dilatation. No adrenal gland nodules. Kidneys, inferior vena cava, and retroperitoneal lymph nodes are unremarkable. Calcification of aorta without aneurysm. Stomach, small bowel, and colon are not  abnormally distended. Stool fills the colon. No free air or free fluid in the abdomen. Pelvis: Appendix is normal. Uterus is anteverted with multiple nodules enlarging the uterus consistent with uterine fibroids. No abnormal adnexal masses. Bladder wall is not thickened. No free or loculated pelvic fluid collections. No pelvic mass or lymphadenopathy otherwise demonstrated. No destructive bone lesions. IMPRESSION: Diffuse calcification throughout the pancreas consistent with chronic pancreatitis. No acute inflammation of the pancreas identified. Mild gallbladder wall thickening and edema may indicate cholecystitis although no radiopaque stones demonstrated. No bile duct dilatation. Fatty infiltration of the liver. Multiple uterine fibroids. Electronically Signed   By: Lucienne Capers M.D.   On: 01/28/2015 00:39   I have personally reviewed and evaluated these images and lab results as part of my medical decision-making.   EKG Interpretation None      MDM   Final diagnoses:  Cholecystitis     patient with history of chronic pancreatitis, presents with similar pain. On exam however patient is tender in right upper quadrant and right lower quadrant. Looking through the history, patient did not have a CT scan for multiple years, recent ultrasound of right upper quadrant was unremarkable. Her exam is not consistent with pancreatitis. Will get labs, urinalysis, CT abdomen and pelvis to further evaluation.  1:35 AM  patient's CT is negative for acute pancreatitis, evidence of chronic pancreatitis. patient continues to have right upper quadrant pain. Her CT scan shows mild gallbladder wall thickening and edema around the gallbladder, suggesting possible cholecystitis. There is no stones identified on CT scan. Ultrasound placed. I discussed patient with Dr. Marlou Starks with general surgery, since no clear gallstone cholecystitis, he believes patient may need HIDA scan, and given her history of alcohol abuse and  pancreatitis, asked patient to be admitted to medicine service.   1:39 AM Spoke with Dr. Alcario Drought with medicine, who declined admission to medicine cervise and refused to call and discuss this with General Surgery. Will wait for [place]. Signed out to PA [place]il and discussed with Dr. Rex Kras. If [place] negative, i think pt is OK to be dc home with symptomatic treatment. If [place] results abnormal, we would have to call surgery back.   Filed Vitals:   01/28/15 0049 01/28/15 0049 01/28/15 0100 01/28/15 0130  BP:  94/61 99/69 101/63  Pulse: 76 66 71 65  Temp:      TempSrc:      Resp:  16    SpO2: 86% 98% 96% 100%     Jeannett Senior, PA-C 01/29/15 [place], MD 01/31/15 [phone]

## 2015-01-28 NOTE — ED Notes (Signed)
PA at bedside.

## 2015-01-28 NOTE — ED Provider Notes (Signed)
H/o pancreatitis, alcohol abuse RUQ pain, "like pancreatitis" CT scan - no acute pancreatitis, shows mild gall bladder wall thickening, no stones Marlou Starks - [place], ?HIDA, admit to medicine  [place] is completely negative - no gall stones, no wall thickening, no murphy's sign. Re-examination: abdomen completely nontender. VSS. She can be discharged home to follow up with Dr. Marisue Humble.   Charlann Lange, PA-C 01/28/15 [place], MD 01/28/15 [phone]

## 2015-01-28 NOTE — ED Notes (Signed)
Pt alert, oriented, and ambulatory upon DC. She was advised to follow up with PCP. She verbalizes understanding.

## 2015-01-28 NOTE — ED Notes (Signed)
Pt ambulating to restroom with steady gait

## 2015-02-18 ENCOUNTER — Ambulatory Visit (INDEPENDENT_AMBULATORY_CARE_PROVIDER_SITE_OTHER): Payer: 59

## 2015-02-18 ENCOUNTER — Ambulatory Visit (INDEPENDENT_AMBULATORY_CARE_PROVIDER_SITE_OTHER): Payer: 59 | Admitting: Family Medicine

## 2015-02-18 ENCOUNTER — Emergency Department (HOSPITAL_COMMUNITY)
Admission: EM | Admit: 2015-02-18 | Discharge: 2015-02-18 | Disposition: A | Discharge status: Home / Self Care | Payer: 59 | Attending: Emergency Medicine | Admitting: Emergency Medicine

## 2015-02-18 ENCOUNTER — Encounter (HOSPITAL_COMMUNITY): Payer: Self-pay | Admitting: Emergency Medicine

## 2015-02-18 ENCOUNTER — Emergency Department (HOSPITAL_COMMUNITY): Payer: 59

## 2015-02-18 VITALS — BP 112/86 | HR 86 | Temp 98.3°F | Resp 16 | Ht 62.0 in | Wt 127.8 lb

## 2015-02-18 DIAGNOSIS — Z951 Presence of aortocoronary bypass graft: Secondary | ICD-10-CM | POA: Insufficient documentation

## 2015-02-18 DIAGNOSIS — I10 Essential (primary) hypertension: Secondary | ICD-10-CM | POA: Diagnosis not present

## 2015-02-18 DIAGNOSIS — I509 Heart failure, unspecified: Secondary | ICD-10-CM | POA: Diagnosis not present

## 2015-02-18 DIAGNOSIS — I2510 Atherosclerotic heart disease of native coronary artery without angina pectoris: Secondary | ICD-10-CM | POA: Insufficient documentation

## 2015-02-18 DIAGNOSIS — Z7982 Long term (current) use of aspirin: Secondary | ICD-10-CM | POA: Insufficient documentation

## 2015-02-18 DIAGNOSIS — R1012 Left upper quadrant pain: Secondary | ICD-10-CM | POA: Diagnosis not present

## 2015-02-18 DIAGNOSIS — Z8669 Personal history of other diseases of the nervous system and sense organs: Secondary | ICD-10-CM | POA: Diagnosis not present

## 2015-02-18 DIAGNOSIS — R140 Abdominal distension (gaseous): Secondary | ICD-10-CM | POA: Diagnosis not present

## 2015-02-18 DIAGNOSIS — Z862 Personal history of diseases of the blood and blood-forming organs and certain disorders involving the immune mechanism: Secondary | ICD-10-CM | POA: Diagnosis not present

## 2015-02-18 DIAGNOSIS — E7800 Pure hypercholesterolemia, unspecified: Secondary | ICD-10-CM | POA: Insufficient documentation

## 2015-02-18 DIAGNOSIS — Z9861 Coronary angioplasty status: Secondary | ICD-10-CM | POA: Diagnosis not present

## 2015-02-18 DIAGNOSIS — E119 Type 2 diabetes mellitus without complications: Secondary | ICD-10-CM | POA: Insufficient documentation

## 2015-02-18 DIAGNOSIS — R1084 Generalized abdominal pain: Secondary | ICD-10-CM

## 2015-02-18 DIAGNOSIS — Z3202 Encounter for pregnancy test, result negative: Secondary | ICD-10-CM | POA: Diagnosis not present

## 2015-02-18 DIAGNOSIS — Z7902 Long term (current) use of antithrombotics/antiplatelets: Secondary | ICD-10-CM | POA: Insufficient documentation

## 2015-02-18 DIAGNOSIS — R110 Nausea: Secondary | ICD-10-CM | POA: Insufficient documentation

## 2015-02-18 DIAGNOSIS — Z9851 Tubal ligation status: Secondary | ICD-10-CM | POA: Diagnosis not present

## 2015-02-18 DIAGNOSIS — R197 Diarrhea, unspecified: Secondary | ICD-10-CM | POA: Diagnosis not present

## 2015-02-18 DIAGNOSIS — E669 Obesity, unspecified: Secondary | ICD-10-CM | POA: Insufficient documentation

## 2015-02-18 DIAGNOSIS — F17210 Nicotine dependence, cigarettes, uncomplicated: Secondary | ICD-10-CM | POA: Diagnosis not present

## 2015-02-18 DIAGNOSIS — R112 Nausea with vomiting, unspecified: Secondary | ICD-10-CM | POA: Diagnosis not present

## 2015-02-18 DIAGNOSIS — R1010 Upper abdominal pain, unspecified: Secondary | ICD-10-CM

## 2015-02-18 DIAGNOSIS — Z79899 Other long term (current) drug therapy: Secondary | ICD-10-CM | POA: Insufficient documentation

## 2015-02-18 DIAGNOSIS — R1011 Right upper quadrant pain: Secondary | ICD-10-CM | POA: Insufficient documentation

## 2015-02-18 LAB — POCT CBC
Granulocyte percent: 69.3 %G (ref 37–80)
HEMATOCRIT: 45.1 % (ref 37.7–47.9)
Hemoglobin: 15.4 g/dL (ref 12.2–16.2)
Lymph, poc: 1.3 (ref 0.6–3.4)
MCH: 31.9 pg — AB (ref 27–31.2)
MCHC: 34 g/dL (ref 31.8–35.4)
MCV: 93.9 fL (ref 80–97)
MID (CBC): 0.2 (ref 0–0.9)
MPV: 7.9 fL (ref 0–99.8)
POC Granulocyte: 3.5 (ref 2–6.9)
POC LYMPH PERCENT: 26 %L (ref 10–50)
POC MID %: 4.7 % (ref 0–12)
Platelet Count, POC: 215 10*3/uL (ref 142–424)
RBC: 4.81 M/uL (ref 4.04–5.48)
RDW, POC: 13.1 %
WBC: 5.1 10*3/uL (ref 4.6–10.2)

## 2015-02-18 LAB — CBC WITH DIFFERENTIAL/PLATELET
BASOS PCT: 0 %
Basophils Absolute: 0 10*3/uL (ref 0.0–0.1)
Eosinophils Absolute: 0 10*3/uL (ref 0.0–0.7)
Eosinophils Relative: 1 %
HEMATOCRIT: 44.6 % (ref 36.0–46.0)
Hemoglobin: 14.7 g/dL (ref 12.0–15.0)
Lymphocytes Relative: 24 %
Lymphs Abs: 1.3 10*3/uL (ref 0.7–4.0)
MCH: 31.6 pg (ref 26.0–34.0)
MCHC: 33 g/dL (ref 30.0–36.0)
MCV: 95.9 fL (ref 78.0–100.0)
MONO ABS: 0.4 10*3/uL (ref 0.1–1.0)
Monocytes Relative: 8 %
NEUTROS ABS: 3.8 10*3/uL (ref 1.7–7.7)
Neutrophils Relative %: 67 %
Platelets: 225 10*3/uL (ref 150–400)
RBC: 4.65 MIL/uL (ref 3.87–5.11)
RDW: 12.3 % (ref 11.5–15.5)
WBC: 5.6 10*3/uL (ref 4.0–10.5)

## 2015-02-18 LAB — POC URINE PREG, ED: Preg Test, Ur: NEGATIVE

## 2015-02-18 LAB — COMPREHENSIVE METABOLIC PANEL
ALBUMIN: 3.6 g/dL (ref 3.5–5.0)
ALT: 55 U/L — ABNORMAL HIGH (ref 14–54)
ANION GAP: 7 (ref 5–15)
AST: 78 U/L — ABNORMAL HIGH (ref 15–41)
Alkaline Phosphatase: 65 U/L (ref 38–126)
BILIRUBIN TOTAL: 0.9 mg/dL (ref 0.3–1.2)
BUN: 11 mg/dL (ref 6–20)
CALCIUM: 9.2 mg/dL (ref 8.9–10.3)
CO2: 25 mmol/L (ref 22–32)
Chloride: 108 mmol/L (ref 101–111)
Creatinine, Ser: 0.71 mg/dL (ref 0.44–1.00)
GLUCOSE: 109 mg/dL — AB (ref 65–99)
Potassium: 4 mmol/L (ref 3.5–5.1)
Sodium: 140 mmol/L (ref 135–145)
TOTAL PROTEIN: 6.7 g/dL (ref 6.5–8.1)

## 2015-02-18 LAB — LIPASE, BLOOD: Lipase: 34 U/L (ref 11–51)

## 2015-02-18 LAB — URINALYSIS, ROUTINE W REFLEX MICROSCOPIC
Glucose, UA: NEGATIVE mg/dL
Hgb urine dipstick: NEGATIVE
KETONES UR: NEGATIVE mg/dL
Leukocytes, UA: NEGATIVE
NITRITE: NEGATIVE
Protein, ur: NEGATIVE mg/dL
Specific Gravity, Urine: 1.026 (ref 1.005–1.030)
pH: 6 (ref 5.0–8.0)

## 2015-02-18 MED ORDER — GI COCKTAIL ~~LOC~~
30.0000 mL | Freq: Once | ORAL | Status: AC
  Administered 2015-02-18: 30 mL via ORAL
  Filled 2015-02-18: qty 30

## 2015-02-18 MED ORDER — HYDROMORPHONE HCL 1 MG/ML IJ SOLN
1.0000 mg | Freq: Once | INTRAMUSCULAR | Status: AC
  Administered 2015-02-18: 1 mg via INTRAVENOUS
  Filled 2015-02-18: qty 1

## 2015-02-18 MED ORDER — IOHEXOL 300 MG/ML  SOLN
100.0000 mL | Freq: Once | INTRAMUSCULAR | Status: AC | PRN
  Administered 2015-02-18: 100 mL via INTRAVENOUS

## 2015-02-18 MED ORDER — DICYCLOMINE HCL 20 MG PO TABS: 20.0000 mg | ORAL_TABLET | Freq: Two times a day (BID) | ORAL | Status: DC

## 2015-02-18 MED ORDER — FENTANYL CITRATE (PF) 100 MCG/2ML IJ SOLN
25.0000 ug | Freq: Once | INTRAMUSCULAR | Status: AC
  Administered 2015-02-18: 25 ug via INTRAVENOUS
  Filled 2015-02-18: qty 2

## 2015-02-18 MED ORDER — PANTOPRAZOLE SODIUM 40 MG IV SOLR
40.0000 mg | Freq: Once | INTRAVENOUS | Status: AC
  Administered 2015-02-18: 40 mg via INTRAVENOUS
  Filled 2015-02-18: qty 40

## 2015-02-18 MED ORDER — ONDANSETRON 4 MG PO TBDP: 4.0000 mg | ORAL_TABLET | Freq: Three times a day (TID) | ORAL | Status: DC | PRN

## 2015-02-18 MED ORDER — ONDANSETRON HCL 4 MG/2ML IJ SOLN
4.0000 mg | Freq: Once | INTRAMUSCULAR | Status: AC
  Administered 2015-02-18: 4 mg via INTRAVENOUS
  Filled 2015-02-18: qty 2

## 2015-02-18 MED ORDER — SODIUM CHLORIDE 0.9 % IV BOLUS (SEPSIS): 1000.0000 mL | Freq: Once | INTRAVENOUS | Status: DC

## 2015-02-18 NOTE — ED Provider Notes (Signed)
CSN: [id number]     Arrival date & time 02/18/15  1011 History   First MD Initiated Contact with Patient 02/18/15 1153     Chief Complaint  Patient presents with  . Abdominal Pain  . Diarrhea    HPI  Marie [name] is an 49 y.o. female with history of CAD, chronic pancreatitis, sarcoidosis, CHF, OSA, HTN, DM who presents to the ED for evaluation of abdominal pain and nausea. She states starting yesterday evening she had sudden onset abdominal pain. States she feels like it goes from her LUQ to RUQ. She states that the pain is sharp and constant. It is associated with nausea but no emesis. She states that she also feels like her abdomen is more distended than usual. Reports one episode of loose, non-bloody stools this AM. She states the pain is worse with food intake. She is unsure if this feels like her past pancreatitis flares because the pain now radiates to RUQ. Of note, pt was seen in the ED a couple weeks ago for same complaint with unremarkable workup at that time CT showed some gallbladder thickening but no gallstones visualized on [place]. Pt states she went to [facility] today and they referred her to the ED. Per UCC note, acute abdomen XR showed mild bowel distension suggestive of possible adynamic ileus. UC provider referred pt to ED for labs, CT, and possible surgery consult.  Past Medical History  Diagnosis Date  . Loss of weight   . Sarcoidosis ([place])   . Leiomyoma of uterus, unspecified   . Pure hypercholesterolemia   . Obesity, unspecified   . Chronic pancreatitis ([place])   . Coronary atherosclerosis of unspecified type of vessel, native or graft   . Hypertension   . Shortness of breath     none since stent 2 yrs ago  . CHF (congestive heart failure) ([place])   . OSA (obstructive sleep apnea)     no CPAP  . Type II diabetes mellitus ([place])   . [id number])     "weekly" (07/29/2013)   Past Surgical History  Procedure Laterality Date  . Coronary artery bypass graft  ~ 2000    CABG X3  .  Tubal ligation  1997  . Hysteroscopy with novasure N/A 09/04/2012    Procedure: HYSTEROSCOPY WITH NOVASURE;  Surgeon: Mora Bellman, MD;  Location: [facility];  Service: Gynecology;  Laterality: N/A;  with removal intrauterine device  . Coronary angioplasty with stent placement  09/2009    "1"   Family History  Problem Relation Age of Onset  . Diabetes Mother   . Coronary artery disease Mother   . Heart disease Mother   . Hyperlipidemia Mother    Social History  Substance Use Topics  . Smoking status: Current Every Day Smoker -- 0.50 packs/day for 28 years    Types: Cigarettes  . Smokeless tobacco: Never Used  . Alcohol Use: 16.8 oz/week    28 Glasses of wine per week     Comment: 07/29/2013 "3-4 glasses of wine qd", denies 01/27/2015   OB History    Gravida Para Term Preterm AB TAB SAB Ectopic Multiple Living   3 3 3       3      Review of Systems  All other systems reviewed and are negative.     Allergies  Lisinopril  Home Medications   Prior to Admission medications   Medication Sig Start Date End Date Taking? Authorizing Provider  aspirin 325 MG tablet Take 325 mg by  mouth daily.    Yes Historical Provider, MD  clopidogrel (PLAVIX) 75 MG tablet Take 75 mg by mouth daily with breakfast.    Yes Historical Provider, MD  hydrochlorothiazide (HYDRODIURIL) 25 MG tablet Take 12.5 mg by mouth every morning.  08/15/12  Yes Oswald Hillock, MD  HYDROcodone-acetaminophen (NORCO/VICODIN) 5-325 MG tablet Take 1 tablet by mouth every 6 (six) hours as needed for moderate pain.  02/01/15  Yes Historical Provider, MD  ibuprofen (ADVIL,MOTRIN) 200 MG tablet Take 400-600 mg by mouth every 6 (six) hours as needed for moderate pain.   Yes Historical Provider, MD  metFORMIN (GLUCOPHAGE) 500 MG tablet Take 500 mg by mouth daily with breakfast. Take with food   Yes Historical Provider, MD  metoprolol (LOPRESSOR) 50 MG tablet Take 50 mg by mouth daily. Verified Metoprolol Tartrate, pt only takes once  daily   Yes Historical Provider, MD  pravastatin (PRAVACHOL) 20 MG tablet Take 20 mg by mouth daily.    Yes Historical Provider, MD  albuterol (PROVENTIL HFA;VENTOLIN HFA) 108 (90 BASE) MCG/ACT inhaler Inhale 2 puffs into the lungs every 6 (six) hours as needed for wheezing or shortness of breath. Patient not taking: Reported on 02/18/2015 08/13/13   Collene Gobble, MD   BP 150/86 mmHg  Pulse 63  Temp(Src) 98.5 F (36.9 C) (Oral)  Resp 16  SpO2 98% Physical Exam  Constitutional: She is oriented to person, place, and time. No distress.  Appears chronically ill, uncomfortable, NAD  HENT:  Right Ear: External ear normal.  Left Ear: External ear normal.  Nose: Nose normal.  Mouth/Throat: Oropharynx is clear and moist. No oropharyngeal exudate.  Eyes: Conjunctivae and EOM are normal. Pupils are equal, round, and reactive to light.  Neck: Normal range of motion. Neck supple.  Cardiovascular: Normal rate, regular rhythm and normal heart sounds.   Pulmonary/Chest: Effort normal and breath sounds normal. No respiratory distress. She has no wheezes.  Abdominal: Soft. Bowel sounds are normal. She exhibits distension. There is tenderness in the right upper quadrant and left upper quadrant. There is guarding. There is no rigidity, no rebound and no CVA tenderness.  Musculoskeletal: Normal range of motion. She exhibits no edema.  Neurological: She is alert and oriented to person, place, and time. No cranial nerve deficit. Coordination normal.  Skin: Skin is warm and dry. She is not diaphoretic.  Psychiatric: She has a normal mood and affect.  Nursing note and vitals reviewed.   ED Course  Procedures (including critical care time) Labs Review Labs Reviewed  COMPREHENSIVE METABOLIC PANEL - Abnormal; Notable for the following:    Glucose, Bld 109 (*)    AST 78 (*)    ALT 55 (*)    All other components within normal limits  URINALYSIS, ROUTINE W REFLEX MICROSCOPIC (NOT AT [facility]) - Abnormal;  Notable for the following:    Color, Urine AMBER (*)    APPearance CLOUDY (*)    Bilirubin Urine SMALL (*)    All other components within normal limits  CBC WITH DIFFERENTIAL/PLATELET  LIPASE, BLOOD  POC URINE PREG, ED    Imaging Review Ct Abdomen Pelvis W Contrast  02/18/2015  CLINICAL DATA:  History abdominal pain in upper abdomen. Patient chronic pancreatitis. EXAM: CT ABDOMEN AND PELVIS WITH CONTRAST TECHNIQUE: Multidetector CT imaging of the abdomen and pelvis was performed using the standard protocol following bolus administration of intravenous contrast. CONTRAST:  19mL OMNIPAQUE IOHEXOL 300 MG/ML  SOLN COMPARISON:  CT 01/28/2015,  CT 08/13/2013 FINDINGS:  Lower chest: There is a band of pleural-parenchymal thickening at the LEFT lung base similar to comparison exam two weeks prior. Hepatobiliary: No focal hepatic lesion. No biliary duct dilatation. Gallbladder is normal. Common bile duct is normal. Pancreas: There is diffuse punctate calcifications throughout the pancreatitis head body and tail. No duct dilatation. No peripancreatic fluid collections or edema to suggest acute inflammation or infection. Spleen: Normal spleen Adrenals/urinary tract: Adrenal glands and kidneys are normal. The ureters and bladder normal. Stomach/Bowel: Stomach, small bowel, appendix, and cecum are normal. The colon and rectosigmoid colon are normal. Vascular/Lymphatic: Abdominal aorta is normal caliber with atherosclerotic calcification. There is no retroperitoneal or periportal lymphadenopathy. No pelvic lymphadenopathy. Reproductive: Multiple rounded lesions within the uterine myometrium measuring 4 to 5 cm. Other: No free fluid. Musculoskeletal: No aggressive osseous lesion. IMPRESSION: 1. No acute findings in the abdomen or  pelvis. 2. Multiple pancreatic calcifications consistent with chronic pancreatitis. No evidence acute pancreatitis. 3. Large round leiomyomas of uterus. 4.  Atherosclerotic calcification of  the abdominal aorta. 5. Linear band of pleural-parenchymal thickening at the LEFT lung base not changed compared to 08/13/2013. Electronically Signed   By: Suzy Bouchard M.D.   On: 02/18/2015 16:40   Dg Abd Acute W/chest  02/18/2015  CLINICAL DATA:  Pain. EXAM: DG ABDOMEN ACUTE W/ 1V CHEST COMPARISON:  08/04/2014.  05/30/2014. FINDINGS: Prior CABG. Mediastinum hilar structures are normal. Stable cardiomegaly. Stable bilateral pleural parenchymal thickening consistent with scarring. Soft tissue structures are unremarkable. Pancreatic calcifications noted consistent with chronic pancreatitis. Air-filled loops of small large bowel noted with normal colonic gas pattern. No free air. No acute bony abnormality . IMPRESSION: 1. Bilateral pleural-parenchymal scarring. 2. Pancreatic calcifications consistent with chronic pancreatitis. 3. Mild bowel distention suggesting adynamic ileus. Follow-up abdominal series to exclude developing small-bowel obstruction suggested . Electronically Signed   By: Marcello Moores  Register   On: 02/18/2015 10:30   I have personally reviewed and evaluated these images and lab results as part of my medical decision-making.   EKG Interpretation None      MDM   Final diagnoses:  Pain of upper abdomen    Pt is afebrile with otherwise unremarkable vitals. However on exam she is quite tender across her RUQ and LUQ with guarding. Her cbc and cmp are at baseline. Given her clinical appearance, exam, and abdominal XR at [facility], will get CT abd/pelvis. Will give pain meds, zofran, and IV protonix. Unclear if this is pancreatitis, cholecystitis, GERD, SBO, or other etiology.  CT shows stable/chronic pleural scarring and pancreatic calcifications. There are also large round leimyomas of uterus but these appear stable and pt is tender in her upper abdomen. No acute findings. Pt reports some relief with medications. Will give bentyl and zofran for home. Discussed PCP/GI follow up and ER return  precautions. Pt in agreement with plan.   Anne Ng, PA-C 02/18/15 [place], MD 02/19/15 1239

## 2015-02-18 NOTE — Progress Notes (Signed)
Subjective:   Patient ID: Marie [name], female     DOB: 03-13-1966, 49 y.o.    MRN: [id number]  PCP: Simona Huh, MD  Chief Complaint  Patient presents with  . Abdominal Pain    first started yesterday morning    HPI  Presents for evaluation of upper abdominal pain, across the entire upper quadrants.  She has CAD, GERD, chronic alcohol use, chronic pancreatitis, type 2 diabetes.  Yesterday morning she noticed this pain. Eating a meal at 12 noon aggravated the pain. Wasn't able to eat much because of the worsening pain. Associated with nausea and vomiting. Looser stools than usual, but no melena or hematochezia. No fever or chills. No urinary symptoms. Has taken 10-12 ibuprofen tablets (200 mg each) over the past 24 hours. Has tried drinking ginger ale to help with the nausea, with minimal relief.  Has been having RIGHT upper quadrant pain for about a month, and is scheduled for an upper endoscopy 02/28/2015.  Known chronic pancreatitis. Most recent evaluation was in the ED on 01/28/15. CT at that time did not reveal acute pancreatitis. RUQ [place] was normal. Similar complaints in 10/2014.  Omeprazole and ondansetron were both on her medication list, but she reports that she doesn't have any to take.  Prior to Admission medications   Medication Sig Start Date End Date Taking? Authorizing Provider  aspirin 325 MG tablet Take 325 mg by mouth daily.    Yes Historical Provider, MD  clopidogrel (PLAVIX) 75 MG tablet Take 75 mg by mouth daily with breakfast.    Yes Historical Provider, MD  hydrochlorothiazide (HYDRODIURIL) 25 MG tablet Take 25 mg by mouth every morning. Takes 1/2 tablet by mouth every morning 08/15/12  Yes Oswald Hillock, MD  ibuprofen (ADVIL,MOTRIN) 200 MG tablet Take 400-600 mg by mouth every 6 (six) hours as needed for moderate pain.   Yes Historical Provider, MD  metFORMIN (GLUCOPHAGE) 500 MG tablet Take 500 mg by mouth daily with breakfast. Take with food    Yes Historical Provider, MD  metoprolol (LOPRESSOR) 50 MG tablet Take 50 mg by mouth daily. Verified Metoprolol Tartrate, pt only takes once daily   Yes Historical Provider, MD  pravastatin (PRAVACHOL) 20 MG tablet Take 20 mg by mouth daily.    Yes Historical Provider, MD  albuterol (PROVENTIL HFA;VENTOLIN HFA) 108 (90 BASE) MCG/ACT inhaler Inhale 2 puffs into the lungs every 6 (six) hours as needed for wheezing or shortness of breath. Patient not taking: Reported on 02/18/2015 08/13/13   Collene Gobble, MD     Allergies  Allergen Reactions  . Lisinopril Hives     Patient Active Problem List   Diagnosis Date Noted  . COPD (chronic obstructive pulmonary disease) ([place]) 08/13/2013  . Hypoxemia 08/13/2013  . Syncope 07/29/2013  . Dehydration 07/29/2013  . Alcohol abuse, continuous 08/11/2012  . Abdominal pain 08/11/2012  . Abnormal uterine bleeding 08/04/2012  . Fibroid uterus 08/04/2012  . Hypokalemia 03/24/2011  . Acute pancreatitis 03/22/2011  . BRONCHITIS, OBSTRUCTIVE CHRONIC 04/14/2010  . 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 WEIGHT LOSS 03/24/2010  . FIBROIDS, UTERUS 08/24/2009  . ACID REFLUX DISEASE 08/03/2009  . HYPERCHOLESTEROLEMIA 02/22/2009  . OBESITY 12/10/2008  . TOBACCO ABUSE 12/10/2008  . PANCREATITIS, CHRONIC 12/10/2008  . HEART DISEASE 12/02/2008  . SLEEP APNEA 12/02/2008  . DIABETES MELLITUS, TYPE II 03/27/2003  . CAD 09/25/1998     Family History  Problem Relation Age of Onset  . Diabetes Mother   . Coronary  artery disease Mother   . Heart disease Mother   . Hyperlipidemia Mother      Social History   Social History  . Marital Status: Married    Spouse Name: N/A  . Number of Children: N/A  . Years of Education: N/A   Occupational History  . Accountant    Social History Main Topics  . Smoking status: Current Every Day Smoker -- 0.50 packs/day for 28 years    Types: Cigarettes  . Smokeless tobacco: Never Used  . Alcohol Use: 16.8 oz/week     28 Glasses of wine per week     Comment: 07/29/2013 "3-4 glasses of wine qd", denies 01/27/2015  . Drug Use: No  . Sexual Activity: Yes    Birth Control/ Protection: Surgical   Other Topics Concern  . Not on file   Social History Narrative        Review of Systems As above.      Objective:  Physical Exam  Constitutional: She is oriented to person, place, and time. She appears well-developed and well-nourished. She is active. She appears ill. No distress.  BP 112/86 mmHg  Pulse 86  Temp(Src) 98.3 F (36.8 C) (Oral)  Resp 16  Ht 5\' 2"  (1.575 m)  Wt 127 lb 12.8 oz (57.97 kg)  BMI 23.37 kg/m2  SpO2 96%   HENT:  Head: Normocephalic and atraumatic.  Eyes: Conjunctivae are normal. No scleral icterus.  Neck: Normal range of motion and phonation normal. No thyromegaly present.  Cardiovascular: Normal rate, regular rhythm, normal heart sounds and intact distal pulses.   Pulmonary/Chest: Effort normal and breath sounds normal.  Abdominal: Soft. Normal appearance. She exhibits no distension and no mass. Bowel sounds are increased. There is no hepatosplenomegaly. There is tenderness in the right upper quadrant, epigastric area and left upper quadrant. There is guarding. There is no rebound and no CVA tenderness.  Lymphadenopathy:    She has no cervical adenopathy.  Neurological: She is alert and oriented to person, place, and time.  Skin: Skin is warm and dry.  Psychiatric: She has a normal mood and affect. Her speech is normal and behavior is normal.    Results for orders placed or performed in visit on 02/18/15  POCT CBC  Result Value Ref Range   WBC 5.1 4.6 - 10.2 K/uL   Lymph, poc 1.3 0.6 - 3.4   POC LYMPH PERCENT 26.0 10 - 50 %L   MID (cbc) 0.2 0 - 0.9   POC MID % 4.7 0 - 12 %M   POC Granulocyte 3.5 2 - 6.9   Granulocyte percent 69.3 37 - 80 %G   RBC 4.81 4.04 - 5.48 M/uL   Hemoglobin 15.4 12.2 - 16.2 g/dL   HCT, POC 45.1 37.7 - 47.9 %   MCV 93.9 80 - 97 fL   MCH,  POC 31.9 (A) 27 - 31.2 pg   MCHC 34.0 31.8 - 35.4 g/dL   RDW, POC 13.1 %   Platelet Count, POC 215 142 - 424 K/uL   MPV 7.9 0 - 99.8 fL    Acute Abdominal Series: [facility] reading (PRIMARY) by  Dr. Carlota Raspberry. Increased markings in the RLL with scar vs. Visible fissure. Questionable RIGHT pleural effusion. No free air under the diaphragm. Calcifications noted in the RUQ, just adjacent to the spine, which are noted on previous imaging studies as chronic calcifications in the pancreas. Increase bowel gas, otherwise non-specific bowel gas patters.  Assessment & Plan:  1. Generalized abdominal pain 2. Nausea and vomiting, intractability of vomiting not specified, unspecified vomiting type Concern for acute on chronic pancreatitis. Increased risk for bleeding due to alcohol and NSAID use. Increased risk for intra-abdominal vascular problems, given CAD and previous CABG at young age. Needs repeat CT scan of the abdomen, labs, possible surgery consult. Unable to provide pain relief here. Discussed with patient and Dr. Carlota Raspberry. Patient advised to proceed to the ED. Charge nurse notified. - POCT CBC - DG Abd Acute W/Chest    Fara Chute, PA-C Physician Assistant-Certified [facility]

## 2015-02-18 NOTE — Progress Notes (Signed)
Xray read and patient discussed with Ms. Jeffery. Agree with assessment and plan of care per her note.   

## 2015-02-18 NOTE — Discharge Instructions (Signed)
Your bloodwork and CT scan today were normal. Please call your primary care provider early next week to schedule a follow-up appointment. Return to the ER for new or worsening symptoms.   Please obtain all of your results from medical records or have your doctors office obtain the results - share them with your doctor - you should be seen at your doctors office in the next 2 days. Call today to arrange your follow up. Take the medications as prescribed. Please review all of the medicines and only take them if you do not have an allergy to them. Please be aware that if you are taking birth control pills, taking other prescriptions, ESPECIALLY ANTIBIOTICS may make the birth control ineffective - if this is the case, either do not engage in sexual activity or use alternative methods of birth control such as condoms until you have finished the medicine and your family doctor says it is OK to restart them. If you are on a blood thinner such as COUMADIN, be aware that any other medicine that you take may cause the coumadin to either work too much, or not enough - you should have your coumadin level rechecked in next 7 days if this is the case.  ?  It is also a possibility that you have an allergic reaction to any of the medicines that you have been prescribed - Everybody reacts differently to medications and while MOST people have no trouble with most medicines, you may have a reaction such as nausea, vomiting, rash, swelling, shortness of breath. If this is the case, please stop taking the medicine immediately and contact your physician.  ?  You should return to the ER if you develop severe or worsening symptoms.

## 2015-02-18 NOTE — ED Notes (Signed)
Patient seen at urgent care this am, sent here for further evaluation. Patient is scheduled for upper endoscopy on 02/28/2015. Patient c/o severe abd pain across her upper abd. Patient has chronic pancreatitis. Chest XR done at UC shows new RLL infiltrates when compared to imaging on 01/28/2015. Patient with nausea and diarrhea.

## 2015-02-24 ENCOUNTER — Ambulatory Visit (INDEPENDENT_AMBULATORY_CARE_PROVIDER_SITE_OTHER): Payer: 59 | Admitting: Emergency Medicine

## 2015-02-24 VITALS — BP 136/90 | HR 74 | Temp 97.4°F | Resp 16 | Ht 61.75 in | Wt 125.8 lb

## 2015-02-24 DIAGNOSIS — K860 Alcohol-induced chronic pancreatitis: Secondary | ICD-10-CM

## 2015-02-24 LAB — POCT CBC
GRANULOCYTE PERCENT: 60.7 % (ref 37–80)
HCT, POC: 49.2 % — AB (ref 37.7–47.9)
Hemoglobin: 16.5 g/dL — AB (ref 12.2–16.2)
Lymph, poc: 1.5 (ref 0.6–3.4)
MCH: 31.8 pg — AB (ref 27–31.2)
MCHC: 33.5 g/dL (ref 31.8–35.4)
MCV: 94.9 fL (ref 80–97)
MID (cbc): 0.5 (ref 0–0.9)
MPV: 8.2 fL (ref 0–99.8)
PLATELET COUNT, POC: 173 10*3/uL (ref 142–424)
POC Granulocyte: 3.1 (ref 2–6.9)
POC LYMPH PERCENT: 30.2 %L (ref 10–50)
POC MID %: 9.1 %M (ref 0–12)
RBC: 5.18 M/uL (ref 4.04–5.48)
RDW, POC: 12.9 %
WBC: 5.1 10*3/uL (ref 4.6–10.2)

## 2015-02-24 NOTE — Progress Notes (Signed)
Subjective:  Patient ID: Marie [name], female    DOB: [birth year]  Age: 49 y.o. MRN: [id number]  CC: Abdominal Pain; Nausea; and Blood In Stools   HPI Marie [name] presents   with a history of chronic alcohol related pancreatitis and she's been seen by gastroenterologist [facility]. On her last visit with him he will he told her that she needed to stop drinking which she has not done. She was seen recently and sent to the emergency room for a CAT scan at that time she had calcifications of the pancreas and was discharged home. Now she is back with continued abdominal pain and nausea with no vomiting. She has no fever chills or stool change did have some blood with bright red blood in her stool. She is taking Plavix at the direction of her cardiologist. He denies any hemodynamic instability or dizziness.  History Marie [name] has a past medical history of Loss of weight; Sarcoidosis ([place]); Leiomyoma of uterus, unspecified; Pure hypercholesterolemia; Obesity, unspecified; Chronic pancreatitis ([place]); Coronary atherosclerosis of unspecified type of vessel, native or graft; Hypertension; Shortness of breath; CHF (congestive heart failure) ([place]); OSA (obstructive sleep apnea); Type II diabetes mellitus ([place]); and Headache(784.0).   She has past surgical history that includes Coronary artery bypass graft (~ 2000); Tubal ligation (1997); Hysteroscopy with novasure (N/A, 09/04/2012); and Coronary angioplasty with stent (09/2009).   Her  family history includes Coronary artery disease in her mother; Diabetes in her mother; Heart disease in her mother; Hyperlipidemia in her mother.  She   reports that she has been smoking Cigarettes.  She has a 14 pack-year smoking history. She has never used smokeless tobacco. She reports that she drinks about 16.8 oz of alcohol per week. She reports that she does not use illicit drugs.  Outpatient Prescriptions Prior to Visit  Medication Sig Dispense Refill  .  albuterol (PROVENTIL HFA;VENTOLIN HFA) 108 (90 BASE) MCG/ACT inhaler Inhale 2 puffs into the lungs every 6 (six) hours as needed for wheezing or shortness of breath. 1 Inhaler 6  . aspirin 325 MG tablet Take 325 mg by mouth daily.     . clopidogrel (PLAVIX) 75 MG tablet Take 75 mg by mouth daily with breakfast.     . dicyclomine (BENTYL) 20 MG tablet Take 1 tablet (20 mg total) by mouth 2 (two) times daily. 20 tablet 0  . hydrochlorothiazide (HYDRODIURIL) 25 MG tablet Take 12.5 mg by mouth every morning.     Marland Kitchen ibuprofen (ADVIL,MOTRIN) 200 MG tablet Take 400-600 mg by mouth every 6 (six) hours as needed for moderate pain.    . metFORMIN (GLUCOPHAGE) 500 MG tablet Take 500 mg by mouth daily with breakfast. Take with food    . metoprolol (LOPRESSOR) 50 MG tablet Take 50 mg by mouth daily. Verified Metoprolol Tartrate, pt only takes once daily    . ondansetron (ZOFRAN ODT) 4 MG disintegrating tablet Take 1 tablet (4 mg total) by mouth every 8 (eight) hours as needed for nausea or vomiting. 20 tablet 0  . pravastatin (PRAVACHOL) 20 MG tablet Take 20 mg by mouth daily.     Marland Kitchen HYDROcodone-acetaminophen (NORCO/VICODIN) 5-325 MG tablet Take 1 tablet by mouth every 6 (six) hours as needed for moderate pain.   0   No facility-administered medications prior to visit.    Social History   Social History  . Marital Status: Married    Spouse Name: N/A  . Number of Children: N/A  . Years of Education: N/A  Occupational History  . Accountant    Social History Main Topics  . Smoking status: Current Every Day Smoker -- 0.50 packs/day for 28 years    Types: Cigarettes  . Smokeless tobacco: Never Used  . Alcohol Use: 16.8 oz/week    28 Glasses of wine per week     Comment: 07/29/2013 "3-4 glasses of wine qd", denies 01/27/2015  . Drug Use: No  . Sexual Activity: Yes    Birth Control/ Protection: Surgical   Other Topics Concern  . None   Social History Narrative     Review of Systems    Constitutional: Negative for fever, chills and appetite change.  HENT: Negative for congestion, ear pain, postnasal drip, sinus pressure and sore throat.   Eyes: Negative for pain and redness.  Respiratory: Negative for cough, shortness of breath and wheezing.   Cardiovascular: Negative for leg swelling.  Gastrointestinal: Positive for nausea, abdominal pain and blood in stool. Negative for vomiting, diarrhea and constipation.  Endocrine: Negative for polyuria.  Genitourinary: Negative for dysuria, urgency, frequency and flank pain.  Musculoskeletal: Negative for gait problem.  Skin: Negative for rash.  Neurological: Negative for weakness and headaches.  Psychiatric/Behavioral: Negative for confusion and decreased concentration. The patient is not nervous/anxious.     Objective:  BP 136/90 mmHg  Pulse 74  Temp(Src) 97.4 F (36.3 C) (Oral)  Resp 16  Ht 5' 1.75" (1.568 m)  Wt 125 lb 12.8 oz (57.063 kg)  BMI 23.21 kg/m2  Physical Exam  Constitutional: She is oriented to person, place, and time. She appears well-developed and well-nourished. No distress.  HENT:  Head: Normocephalic and atraumatic.  Right Ear: External ear normal.  Left Ear: External ear normal.  Nose: Nose normal.  Eyes: Conjunctivae and EOM are normal. Pupils are equal, round, and reactive to light. No scleral icterus.  Neck: Normal range of motion. Neck supple. No tracheal deviation present.  Cardiovascular: Normal rate, regular rhythm and normal heart sounds.   Pulmonary/Chest: Effort normal. No respiratory distress. She has no wheezes. She has no rales.  Abdominal: She exhibits no mass. There is tenderness in the epigastric area. There is no rebound and no guarding.  Musculoskeletal: She exhibits no edema.  Lymphadenopathy:    She has no cervical adenopathy.  Neurological: She is alert and oriented to person, place, and time. Coordination normal.  Skin: Skin is warm and dry. No rash noted.  Psychiatric: She  has a normal mood and affect. Her behavior is normal.      Assessment & Plan:   There are no diagnoses linked to this encounter. I am having Marie [name] maintain her aspirin, metFORMIN, clopidogrel, pravastatin, metoprolol, hydrochlorothiazide, albuterol, ibuprofen, HYDROcodone-acetaminophen, dicyclomine, and ondansetron.  No orders of the defined types were placed in this encounter.   I discussed the patient's management with her gastroenterologist from [facility]. Advised her on her last visit to stop alcohol consumption and start using a pancreatic replacement which she claims to be doing. She has not stop drinking. She is nauseated but not vomiting and she's holding liquids down she has an appointment see her gastroenterologist next week to have a upper endoscopy. He suggested referral to pain management  I suggested with blood in her stool that she stop using a Plavix until she is able communicate with cardiologist  Appropriate red flag conditions were discussed with the patient as well as actions that should be taken.  Patient expressed his understanding.  Follow-up: No Follow-up on file.  Ouida Sills,  Janalee Dane, MD   Results for orders placed or performed in visit on 02/24/15  POCT CBC  Result Value Ref Range   WBC 5.1 4.6 - 10.2 K/uL   Lymph, poc 1.5 0.6 - 3.4   POC LYMPH PERCENT 30.2 10 - 50 %L   MID (cbc) 0.5 0 - 0.9   POC MID % 9.1 0 - 12 %M   POC Granulocyte 3.1 2 - 6.9   Granulocyte percent 60.7 37 - 80 %G   RBC 5.18 4.04 - 5.48 M/uL   Hemoglobin 16.5 (A) 12.2 - 16.2 g/dL   HCT, POC 49.2 (A) 37.7 - 47.9 %   MCV 94.9 80 - 97 fL   MCH, POC 31.8 (A) 27 - 31.2 pg   MCHC 33.5 31.8 - 35.4 g/dL   RDW, POC 12.9 %   Platelet Count, POC 173 142 - 424 K/uL   MPV 8.2 0 - 99.8 fL

## 2015-02-28 ENCOUNTER — Other Ambulatory Visit: Payer: Self-pay | Admitting: Gastroenterology

## 2015-04-01 MED FILL — HYDROCODON-APAP 5-325: 5-325 | 7 days supply | Qty: 30 | Fill #0

## 2015-04-16 DIAGNOSIS — J449 Chronic obstructive pulmonary disease, unspecified: Secondary | ICD-10-CM | POA: Diagnosis not present

## 2015-04-17 DIAGNOSIS — Z951 Presence of aortocoronary bypass graft: Secondary | ICD-10-CM | POA: Diagnosis not present

## 2015-04-17 DIAGNOSIS — K219 Gastro-esophageal reflux disease without esophagitis: Secondary | ICD-10-CM | POA: Diagnosis not present

## 2015-04-17 DIAGNOSIS — K860 Alcohol-induced chronic pancreatitis: Secondary | ICD-10-CM | POA: Diagnosis not present

## 2015-04-17 DIAGNOSIS — R1013 Epigastric pain: Secondary | ICD-10-CM | POA: Diagnosis not present

## 2015-04-18 MED FILL — PANTOPRAZOLE SOD DR 40 MG T: 40 | 90 days supply | Qty: 90 | Fill #0

## 2015-04-18 MED FILL — ZENPEP DR 25,000 UNITS CAP: 25000-79000 | 30 days supply | Qty: 180 | Fill #0

## 2015-04-18 MED FILL — traMADol HCL 50 MG TABS: 50 | 30 days supply | Qty: 120 | Fill #0

## 2015-05-02 MED FILL — HYDROCODON-APAP 5-325: 5-325 | 7 days supply | Qty: 30 | Fill #0

## 2015-05-17 DIAGNOSIS — J449 Chronic obstructive pulmonary disease, unspecified: Secondary | ICD-10-CM | POA: Diagnosis not present

## 2015-05-30 MED FILL — PRAVASTATIN SODIUM 20 MG TA: 20 | 90 days supply | Qty: 90 | Fill #0

## 2015-05-30 MED FILL — METOPROLOL TARTRATE 100 MG: 100 | 90 days supply | Qty: 90 | Fill #0

## 2015-05-30 MED FILL — HYDROCODON-APAP 5-325: 5-325 | 8 days supply | Qty: 30 | Fill #0

## 2015-05-30 MED FILL — CLOPIDOGREL 75 MG TABLET: 75 | 90 days supply | Qty: 90 | Fill #0

## 2015-05-30 MED FILL — metFORMIN HCL 500 MG TABS: 500 | 90 days supply | Qty: 90 | Fill #0

## 2015-05-30 MED FILL — HYDROCHLOROTHIAZIDE 25 MG T: 25 | 90 days supply | Qty: 90 | Fill #0

## 2015-06-14 DIAGNOSIS — J449 Chronic obstructive pulmonary disease, unspecified: Secondary | ICD-10-CM | POA: Diagnosis not present

## 2015-06-19 ENCOUNTER — Ambulatory Visit (INDEPENDENT_AMBULATORY_CARE_PROVIDER_SITE_OTHER): Payer: 59 | Admitting: Family Medicine

## 2015-06-19 ENCOUNTER — Ambulatory Visit (INDEPENDENT_AMBULATORY_CARE_PROVIDER_SITE_OTHER): Payer: 59

## 2015-06-19 VITALS — BP 132/88 | HR 87 | Temp 98.2°F | Resp 12 | Ht 62.0 in | Wt 124.0 lb

## 2015-06-19 DIAGNOSIS — M5441 Lumbago with sciatica, right side: Secondary | ICD-10-CM

## 2015-06-19 DIAGNOSIS — M545 Low back pain: Secondary | ICD-10-CM | POA: Diagnosis not present

## 2015-06-19 DIAGNOSIS — M5442 Lumbago with sciatica, left side: Secondary | ICD-10-CM | POA: Diagnosis not present

## 2015-06-19 DIAGNOSIS — S20229A Contusion of unspecified back wall of thorax, initial encounter: Secondary | ICD-10-CM

## 2015-06-19 DIAGNOSIS — S3992XA Unspecified injury of lower back, initial encounter: Secondary | ICD-10-CM | POA: Diagnosis not present

## 2015-06-19 MED ORDER — CYCLOBENZAPRINE HCL 5 MG PO TABS: ORAL_TABLET | ORAL | Status: DC

## 2015-06-19 MED ORDER — HYDROCODONE-ACETAMINOPHEN 5-325 MG PO TABS: 1.0000 | ORAL_TABLET | Freq: Four times a day (QID) | ORAL | Status: DC | PRN

## 2015-06-19 NOTE — Patient Instructions (Signed)
I do not see any broken bones on your x-rays today. Your reflexes and strength appear to be okay as well on exam. I suspect you have a contusion to low back with secondary muscle spasm and possible pinched nerve. You can take Flexeril every 8 hours as needed for muscle spasm, and hydrocodone up to every 6 hours if needed for pain. Be careful combining these as both can cause sedation. Heat or ice to affected area and gentle range of motion as tolerated. If any loss of strength in your legs, stool or urine incontinence, or decreased sensation in the groin, go to the emergency room. Follow-up in the next 3-4 days if not improving to decide if MRI, physical therapy, or orthopedic evaluation is needed.  Return to the clinic or go to the nearest emergency room if any of your symptoms worsen or new symptoms occur.   Low Back Strain With Rehab A strain is an injury in which a tendon or muscle is torn. The muscles and tendons of the lower back are vulnerable to strains. However, these muscles and tendons are very strong and require a great force to be injured. Strains are classified into three categories. Grade 1 strains cause pain, but the tendon is not lengthened. Grade 2 strains include a lengthened ligament, due to the ligament being stretched or partially ruptured. With grade 2 strains there is still function, although the function may be decreased. Grade 3 strains involve a complete tear of the tendon or muscle, and function is usually impaired. SYMPTOMS   Pain in the lower back.  Pain that affects one side more than the other.  Pain that gets worse with movement and may be felt in the hip, buttocks, or back of the thigh.  Muscle spasms of the muscles in the back.  Swelling along the muscles of the back.  Loss of strength of the back muscles.  Crackling sound (crepitation) when the muscles are touched. CAUSES  Lower back strains occur when a force is placed on the muscles or tendons that is  greater than they can handle. Common causes of injury include:  Prolonged overuse of the muscle-tendon units in the lower back, usually from incorrect posture.  A single violent injury or force applied to the back. RISK INCREASES WITH:  Sports that involve twisting forces on the spine or a lot of bending at the waist (football, rugby, weightlifting, bowling, golf, tennis, speed skating, racquetball, swimming, running, gymnastics, diving).  Poor strength and flexibility.  Failure to warm up properly before activity.  Family history of lower back pain or disk disorders.  Previous back injury or surgery (especially fusion).  Poor posture with lifting, especially heavy objects.  Prolonged sitting, especially with poor posture. PREVENTION   Learn and use proper posture when sitting or lifting (maintain proper posture when sitting, lift using the knees and legs, not at the waist).  Warm up and stretch properly before activity.  Allow for adequate recovery between workouts.  Maintain physical fitness:  Strength, flexibility, and endurance.  Cardiovascular fitness. PROGNOSIS  If treated properly, lower back strains usually heal within 6 weeks. RELATED COMPLICATIONS   Recurring symptoms, resulting in a chronic problem.  Chronic inflammation, scarring, and partial muscle-tendon tear.  Delayed healing or resolution of symptoms.  Prolonged disability. TREATMENT  Treatment first involves the use of ice and medicine, to reduce pain and inflammation. The use of strengthening and stretching exercises may help reduce pain with activity. These exercises may be performed at home  or with a therapist. Severe injuries may require referral to a therapist for further evaluation and treatment, such as ultrasound. Your caregiver may advise that you wear a back brace or corset, to help reduce pain and discomfort. Often, prolonged bed rest results in greater harm then benefit. Corticosteroid  injections may be recommended. However, these should be reserved for the most serious cases. It is important to avoid using your back when lifting objects. At night, sleep on your back on a firm mattress with a pillow placed under your knees. If non-surgical treatment is unsuccessful, surgery may be needed.  MEDICATION   If pain medicine is needed, nonsteroidal anti-inflammatory medicines (aspirin and ibuprofen), or other minor pain relievers (acetaminophen), are often advised.  Do not take pain medicine for 7 days before surgery.  Prescription pain relievers may be given, if your caregiver thinks they are needed. Use only as directed and only as much as you need.  Ointments applied to the skin may be helpful.  Corticosteroid injections may be given by your caregiver. These injections should be reserved for the most serious cases, because they may only be given a certain number of times. HEAT AND COLD  Cold treatment (icing) should be applied for 10 to 15 minutes every 2 to 3 hours for inflammation and pain, and immediately after activity that aggravates your symptoms. Use ice packs or an ice massage.  Heat treatment may be used before performing stretching and strengthening activities prescribed by your caregiver, physical therapist, or athletic trainer. Use a heat pack or a warm water soak. SEEK MEDICAL CARE IF:   Symptoms get worse or do not improve in 2 to 4 weeks, despite treatment.  You develop numbness, weakness, or loss of bowel or bladder function.  New, unexplained symptoms develop. (Drugs used in treatment may produce side effects.) EXERCISES  RANGE OF MOTION (ROM) AND STRETCHING EXERCISES - Low Back Strain Most people with lower back pain will find that their symptoms get worse with excessive bending forward (flexion) or arching at the lower back (extension). The exercises which will help resolve your symptoms will focus on the opposite motion.  Your physician, physical  therapist or athletic trainer will help you determine which exercises will be most helpful to resolve your lower back pain. Do not complete any exercises without first consulting with your caregiver. Discontinue any exercises which make your symptoms worse until you speak to your caregiver.  If you have pain, numbness or tingling which travels down into your buttocks, leg or foot, the goal of the therapy is for these symptoms to move closer to your back and eventually resolve. Sometimes, these leg symptoms will get better, but your lower back pain may worsen. This is typically an indication of progress in your rehabilitation. Be very alert to any changes in your symptoms and the activities in which you participated in the 24 hours prior to the change. Sharing this information with your caregiver will allow him/her to most efficiently treat your condition.  These exercises may help you when beginning to rehabilitate your injury. Your symptoms may resolve with or without further involvement from your physician, physical therapist or athletic trainer. While completing these exercises, remember:  Restoring tissue flexibility helps normal motion to return to the joints. This allows healthier, less painful movement and activity.  An effective stretch should be held for at least 30 seconds.  A stretch should never be painful. You should only feel a gentle lengthening or release in the stretched tissue.  FLEXION RANGE OF MOTION AND STRETCHING EXERCISES: STRETCH - Flexion, Single Knee to Chest   Lie on a firm bed or floor with both legs extended in front of you.  Keeping one leg in contact with the floor, bring your opposite knee to your chest. Hold your leg in place by either grabbing behind your thigh or at your knee.  Pull until you feel a gentle stretch in your lower back. Hold __________ seconds.  Slowly release your grasp and repeat the exercise with the opposite side. Repeat __________ times.  Complete this exercise __________ times per day.  STRETCH - Flexion, Double Knee to Chest   Lie on a firm bed or floor with both legs extended in front of you.  Keeping one leg in contact with the floor, bring your opposite knee to your chest.  Tense your stomach muscles to support your back and then lift your other knee to your chest. Hold your legs in place by either grabbing behind your thighs or at your knees.  Pull both knees toward your chest until you feel a gentle stretch in your lower back. Hold __________ seconds.  Tense your stomach muscles and slowly return one leg at a time to the floor. Repeat __________ times. Complete this exercise __________ times per day.  STRETCH - Low Trunk Rotation  Lie on a firm bed or floor. Keeping your legs in front of you, bend your knees so they are both pointed toward the ceiling and your feet are flat on the floor.  Extend your arms out to the side. This will stabilize your upper body by keeping your shoulders in contact with the floor.  Gently and slowly drop both knees together to one side until you feel a gentle stretch in your lower back. Hold for __________ seconds.  Tense your stomach muscles to support your lower back as you bring your knees back to the starting position. Repeat the exercise to the other side. Repeat __________ times. Complete this exercise __________ times per day  EXTENSION RANGE OF MOTION AND FLEXIBILITY EXERCISES: STRETCH - Extension, Prone on Elbows   Lie on your stomach on the floor, a bed will be too soft. Place your palms about shoulder width apart and at the height of your head.  Place your elbows under your shoulders. If this is too painful, stack pillows under your chest.  Allow your body to relax so that your hips drop lower and make contact more completely with the floor.  Hold this position for __________ seconds.  Slowly return to lying flat on the floor. Repeat __________ times. Complete this  exercise __________ times per day.  RANGE OF MOTION - Extension, Prone Press Ups  Lie on your stomach on the floor, a bed will be too soft. Place your palms about shoulder width apart and at the height of your head.  Keeping your back as relaxed as possible, slowly straighten your elbows while keeping your hips on the floor. You may adjust the placement of your hands to maximize your comfort. As you gain motion, your hands will come more underneath your shoulders.  Hold this position __________ seconds.  Slowly return to lying flat on the floor. Repeat __________ times. Complete this exercise __________ times per day.  RANGE OF MOTION- Quadruped, Neutral Spine   Assume a hands and knees position on a firm surface. Keep your hands under your shoulders and your knees under your hips. You may place padding under your knees for comfort.  Drop  your head and point your tail bone toward the ground below you. This will round out your lower back like an angry cat. Hold this position for __________ seconds.  Slowly lift your head and release your tail bone so that your back sags into a large arch, like an old horse.  Hold this position for __________ seconds.  Repeat this until you feel limber in your lower back.  Now, find your "sweet spot." This will be the most comfortable position somewhere between the two previous positions. This is your neutral spine. Once you have found this position, tense your stomach muscles to support your lower back.  Hold this position for __________ seconds. Repeat __________ times. Complete this exercise __________ times per day.  STRENGTHENING EXERCISES - Low Back Strain These exercises may help you when beginning to rehabilitate your injury. These exercises should be done near your "sweet spot." This is the neutral, low-back arch, somewhere between fully rounded and fully arched, that is your least painful position. When performed in this safe range of motion,  these exercises can be used for people who have either a flexion or extension based injury. These exercises may resolve your symptoms with or without further involvement from your physician, physical therapist or athletic trainer. While completing these exercises, remember:   Muscles can gain both the endurance and the strength needed for everyday activities through controlled exercises.  Complete these exercises as instructed by your physician, physical therapist or athletic trainer. Increase the resistance and repetitions only as guided.  You may experience muscle soreness or fatigue, but the pain or discomfort you are trying to eliminate should never worsen during these exercises. If this pain does worsen, stop and make certain you are following the directions exactly. If the pain is still present after adjustments, discontinue the exercise until you can discuss the trouble with your caregiver. STRENGTHENING - Deep Abdominals, Pelvic Tilt  Lie on a firm bed or floor. Keeping your legs in front of you, bend your knees so they are both pointed toward the ceiling and your feet are flat on the floor.  Tense your lower abdominal muscles to press your lower back into the floor. This motion will rotate your pelvis so that your tail bone is scooping upwards rather than pointing at your feet or into the floor.  With a gentle tension and even breathing, hold this position for __________ seconds. Repeat __________ times. Complete this exercise __________ times per day.  STRENGTHENING - Abdominals, Crunches   Lie on a firm bed or floor. Keeping your legs in front of you, bend your knees so they are both pointed toward the ceiling and your feet are flat on the floor. Cross your arms over your chest.  Slightly tip your chin down without bending your neck.  Tense your abdominals and slowly lift your trunk high enough to just clear your shoulder blades. Lifting higher can put excessive stress on the lower  back and does not further strengthen your abdominal muscles.  Control your return to the starting position. Repeat __________ times. Complete this exercise __________ times per day.  STRENGTHENING - Quadruped, Opposite UE/LE Lift   Assume a hands and knees position on a firm surface. Keep your hands under your shoulders and your knees under your hips. You may place padding under your knees for comfort.  Find your neutral spine and gently tense your abdominal muscles so that you can maintain this position. Your shoulders and hips should form a rectangle that is  parallel with the floor and is not twisted.  Keeping your trunk steady, lift your right hand no higher than your shoulder and then your left leg no higher than your hip. Make sure you are not holding your breath. Hold this position __________ seconds.  Continuing to keep your abdominal muscles tense and your back steady, slowly return to your starting position. Repeat with the opposite arm and leg. Repeat __________ times. Complete this exercise __________ times per day.  STRENGTHENING - Lower Abdominals, Double Knee Lift  Lie on a firm bed or floor. Keeping your legs in front of you, bend your knees so they are both pointed toward the ceiling and your feet are flat on the floor.  Tense your abdominal muscles to brace your lower back and slowly lift both of your knees until they come over your hips. Be certain not to hold your breath.  Hold __________ seconds. Using your abdominal muscles, return to the starting position in a slow and controlled manner. Repeat __________ times. Complete this exercise __________ times per day.  POSTURE AND BODY MECHANICS CONSIDERATIONS - Low Back Strain Keeping correct posture when sitting, standing or completing your activities will reduce the stress put on different body tissues, allowing injured tissues a chance to heal and limiting painful experiences. The following are general guidelines for improved  posture. Your physician or physical therapist will provide you with any instructions specific to your needs. While reading these guidelines, remember:  The exercises prescribed by your provider will help you have the flexibility and strength to maintain correct postures.  The correct posture provides the best environment for your joints to work. All of your joints have less wear and tear when properly supported by a spine with good posture. This means you will experience a healthier, less painful body.  Correct posture must be practiced with all of your activities, especially prolonged sitting and standing. Correct posture is as important when doing repetitive low-stress activities (typing) as it is when doing a single heavy-load activity (lifting). RESTING POSITIONS Consider which positions are most painful for you when choosing a resting position. If you have pain with flexion-based activities (sitting, bending, stooping, squatting), choose a position that allows you to rest in a less flexed posture. You would want to avoid curling into a fetal position on your side. If your pain worsens with extension-based activities (prolonged standing, working overhead), avoid resting in an extended position such as sleeping on your stomach. Most people will find more comfort when they rest with their spine in a more neutral position, neither too rounded nor too arched. Lying on a non-sagging bed on your side with a pillow between your knees, or on your back with a pillow under your knees will often provide some relief. Keep in mind, being in any one position for a prolonged period of time, no matter how correct your posture, can still lead to stiffness. PROPER SITTING POSTURE In order to minimize stress and discomfort on your spine, you must sit with correct posture. Sitting with good posture should be effortless for a healthy body. Returning to good posture is a gradual process. Many people can work toward this  most comfortably by using various supports until they have the flexibility and strength to maintain this posture on their own. When sitting with proper posture, your ears will fall over your shoulders and your shoulders will fall over your hips. You should use the back of the chair to support your upper back. Your lower back  will be in a neutral position, just slightly arched. You may place a small pillow or folded towel at the base of your lower back for support.  When working at a desk, create an environment that supports good, upright posture. Without extra support, muscles tire, which leads to excessive strain on joints and other tissues. Keep these recommendations in mind: CHAIR:  A chair should be able to slide under your desk when your back makes contact with the back of the chair. This allows you to work closely.  The chair's height should allow your eyes to be level with the upper part of your monitor and your hands to be slightly lower than your elbows. BODY POSITION  Your feet should make contact with the floor. If this is not possible, use a foot rest.  Keep your ears over your shoulders. This will reduce stress on your neck and lower back. INCORRECT SITTING POSTURES  If you are feeling tired and unable to assume a healthy sitting posture, do not slouch or slump. This puts excessive strain on your back tissues, causing more damage and pain. Healthier options include:  Using more support, like a lumbar pillow.  Switching tasks to something that requires you to be upright or walking.  Talking a brief walk.  Lying down to rest in a neutral-spine position. PROLONGED STANDING WHILE SLIGHTLY LEANING FORWARD  When completing a task that requires you to lean forward while standing in one place for a long time, place either foot up on a stationary 2-4 inch high object to help maintain the best posture. When both feet are on the ground, the lower back tends to lose its slight inward curve.  If this curve flattens (or becomes too large), then the back and your other joints will experience too much stress, tire more quickly, and can cause pain. CORRECT STANDING POSTURES Proper standing posture should be assumed with all daily activities, even if they only take a few moments, like when brushing your teeth. As in sitting, your ears should fall over your shoulders and your shoulders should fall over your hips. You should keep a slight tension in your abdominal muscles to brace your spine. Your tailbone should point down to the ground, not behind your body, resulting in an over-extended swayback posture.  INCORRECT STANDING POSTURES  Common incorrect standing postures include a forward head, locked knees and/or an excessive swayback. WALKING Walk with an upright posture. Your ears, shoulders and hips should all line-up. PROLONGED ACTIVITY IN A FLEXED POSITION When completing a task that requires you to bend forward at your waist or lean over a low surface, try to find a way to stabilize 3 out of 4 of your limbs. You can place a hand or elbow on your thigh or rest a knee on the surface you are reaching across. This will provide you more stability so that your muscles do not fatigue as quickly. By keeping your knees relaxed, or slightly bent, you will also reduce stress across your lower back. CORRECT LIFTING TECHNIQUES DO :   Assume a wide stance. This will provide you more stability and the opportunity to get as close as possible to the object which you are lifting.  Tense your abdominals to brace your spine. Bend at the knees and hips. Keeping your back locked in a neutral-spine position, lift using your leg muscles. Lift with your legs, keeping your back straight.  Test the weight of unknown objects before attempting to lift them.  Try to  keep your elbows locked down at your sides in order get the best strength from your shoulders when carrying an object.  Always ask for help when  lifting heavy or awkward objects. INCORRECT LIFTING TECHNIQUES DO NOT:   Lock your knees when lifting, even if it is a small object.  Bend and twist. Pivot at your feet or move your feet when needing to change directions.  Assume that you can safely pick up even a paper clip without proper posture.   This information is not intended to replace advice given to you by your health care provider. Make sure you discuss any questions you have with your health care provider.   Document Released: 03/12/2005 Document Revised: 04/02/2014 Document Reviewed: 06/24/2008 [employer] Interactive Patient Education 2016 [employer] Back Pain, Adult Back pain is very common in adults.The cause of back pain is rarely dangerous and the pain often gets better over time.The cause of your back pain may not be known. Some common causes of back pain include:  Strain of the muscles or ligaments supporting the spine.  Wear and tear (degeneration) of the spinal disks.  Arthritis.  Direct injury to the back. For many people, back pain may return. Since back pain is rarely dangerous, most people can learn to manage this condition on their own. HOME CARE INSTRUCTIONS Watch your back pain for any changes. The following actions may help to lessen any discomfort you are feeling:  Remain active. It is stressful on your back to sit or stand in one place for long periods of time. Do not sit, drive, or stand in one place for more than 30 minutes at a time. Take short walks on even surfaces as soon as you are able.Try to increase the length of time you walk each day.  Exercise regularly as directed by your health care provider. Exercise helps your back heal faster. It also helps avoid future injury by keeping your muscles strong and flexible.  Do not stay in bed.Resting more than 1-2 days can delay your recovery.  Pay attention to your body when you bend and lift. The most comfortable positions are those that put  less stress on your recovering back. Always use proper lifting techniques, including:  Bending your knees.  Keeping the load close to your body.  Avoiding twisting.  Find a comfortable position to sleep. Use a firm mattress and lie on your side with your knees slightly bent. If you lie on your back, put a pillow under your knees.  Avoid feeling anxious or stressed.Stress increases muscle tension and can worsen back pain.It is important to recognize when you are anxious or stressed and learn ways to manage it, such as with exercise.  Take medicines only as directed by your health care provider. Over-the-counter medicines to reduce pain and inflammation are often the most helpful.Your health care provider may prescribe muscle relaxant drugs.These medicines help dull your pain so you can more quickly return to your normal activities and healthy exercise.  Apply ice to the injured area:  Put ice in a plastic bag.  Place a towel between your skin and the bag.  Leave the ice on for 20 minutes, 2-3 times a day for the first 2-3 days. After that, ice and heat may be alternated to reduce pain and spasms.  Maintain a healthy weight. Excess weight puts extra stress on your back and makes it difficult to maintain good posture. SEEK MEDICAL CARE IF:  You have pain that is not relieved  with rest or medicine.  You have increasing pain going down into the legs or buttocks.  You have pain that does not improve in one week.  You have night pain.  You lose weight.  You have a fever or chills. SEEK IMMEDIATE MEDICAL CARE IF:   You develop new bowel or bladder control problems.  You have unusual weakness or numbness in your arms or legs.  You develop nausea or vomiting.  You develop abdominal pain.  You feel faint.   This information is not intended to replace advice given to you by your health care provider. Make sure you discuss any questions you have with your health care  provider.   Document Released: 03/12/2005 Document Revised: 04/02/2014 Document Reviewed: 07/14/2013 [employer] Interactive Patient Education [employer].

## 2015-06-19 NOTE — Progress Notes (Signed)
Subjective:    Patient ID: Marie [name], female    DOB: 09/25/1965, 50 y.o.   MRN: [id number] By signing my name below, I, Zola Button, attest that this documentation has been prepared under the direction and in the presence of Merri Ray, MD.  Electronically Signed: Zola Button, Medical Scribe. 06/19/2015. 8:29 AM.   HPI HPI Comments: Marie [name] is a 50 y.o. female with a history of pancreatitis, CAD, and DM who presents to the [facility] complaining of a fall that occurred yesterday while cleaning a ceiling fan. She has multiple medical problems per problem list. History of fall yesterday, here for back and leg pain. Patient was 3 steps up on a stepladder, about 2-3 feet up and she fell backwards on her flat back. She was able to bear weight after the fall. She has back pain radiating to both legs that worsened last night. Patient has tried heat and 6 ibuprofen for the pain. She felt that her legs were about to give out last night and has also had a single episode of urinary incontinence. She has been ambulating slowly since last night. Patient denies saddle anesthesia and other bowel or bladder incontinence. She also denies stomach ulcers. She is not on any chronic pain medications. Patient takes Plavix for CAD.  Patient Active Problem List   Diagnosis Date Noted  . COPD (chronic obstructive pulmonary disease) ([place]) 08/13/2013  . Hypoxemia 08/13/2013  . Syncope 07/29/2013  . Dehydration 07/29/2013  . Alcohol abuse, continuous 08/11/2012  . Abdominal pain 08/11/2012  . Abnormal uterine bleeding 08/04/2012  . Fibroid uterus 08/04/2012  . Hypokalemia 03/24/2011  . Acute pancreatitis 03/22/2011  . BRONCHITIS, OBSTRUCTIVE CHRONIC 04/14/2010  . 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 WEIGHT LOSS 03/24/2010  . FIBROIDS, UTERUS 08/24/2009  . ACID REFLUX DISEASE 08/03/2009  . HYPERCHOLESTEROLEMIA 02/22/2009  . OBESITY 12/10/2008  . TOBACCO ABUSE 12/10/2008  .  PANCREATITIS, CHRONIC 12/10/2008  . HEART DISEASE 12/02/2008  . SLEEP APNEA 12/02/2008  . DIABETES MELLITUS, TYPE II 03/27/2003  . CAD 09/25/1998   Past Medical History  Diagnosis Date  . Loss of weight   . Sarcoidosis ([place])   . Leiomyoma of uterus, unspecified   . Pure hypercholesterolemia   . Obesity, unspecified   . Chronic pancreatitis ([place])   . Coronary atherosclerosis of unspecified type of vessel, native or graft   . Hypertension   . Shortness of breath     none since stent 2 yrs ago  . CHF (congestive heart failure) ([place])   . OSA (obstructive sleep apnea)     no CPAP  . Type II diabetes mellitus ([place])   . [id number])     "weekly" (07/29/2013)   Past Surgical History  Procedure Laterality Date  . Coronary artery bypass graft  ~ 2000    CABG X3  . Tubal ligation  1997  . Hysteroscopy with novasure N/A 09/04/2012    Procedure: HYSTEROSCOPY WITH NOVASURE;  Surgeon: Mora Bellman, MD;  Location: [facility];  Service: Gynecology;  Laterality: N/A;  with removal intrauterine device  . Coronary angioplasty with stent placement  09/2009    "1"   Allergies  Allergen Reactions  . Lisinopril Hives   Prior to Admission medications   Medication Sig Start Date End Date Taking? Authorizing Provider  albuterol (PROVENTIL HFA;VENTOLIN HFA) 108 (90 BASE) MCG/ACT inhaler Inhale 2 puffs into the lungs every 6 (six) hours as needed for wheezing or shortness of breath. 08/13/13  Yes Collene Gobble, MD  aspirin 325 MG tablet Take 325 mg by mouth daily.    Yes Historical Provider, MD  clopidogrel (PLAVIX) 75 MG tablet Take 75 mg by mouth daily with breakfast.    Yes Historical Provider, MD  dicyclomine (BENTYL) 20 MG tablet Take 1 tablet (20 mg total) by mouth 2 (two) times daily. 02/18/15  Yes Olivia Canter Sam, PA-C  hydrochlorothiazide (HYDRODIURIL) 25 MG tablet Take 12.5 mg by mouth every morning.  08/15/12  Yes Oswald Hillock, MD  HYDROcodone-acetaminophen (NORCO/VICODIN) 5-325 MG tablet Take 1  tablet by mouth every 6 (six) hours as needed for moderate pain.  02/01/15  Yes Historical Provider, MD  ibuprofen (ADVIL,MOTRIN) 200 MG tablet Take 400-600 mg by mouth every 6 (six) hours as needed for moderate pain.   Yes Historical Provider, MD  metFORMIN (GLUCOPHAGE) 500 MG tablet Take 500 mg by mouth daily with breakfast. Take with food   Yes Historical Provider, MD  ondansetron (ZOFRAN ODT) 4 MG disintegrating tablet Take 1 tablet (4 mg total) by mouth every 8 (eight) hours as needed for nausea or vomiting. 02/18/15  Yes Olivia Canter Sam, PA-C  pravastatin (PRAVACHOL) 20 MG tablet Take 20 mg by mouth daily.    Yes Historical Provider, MD  metoprolol (LOPRESSOR) 100 MG tablet  05/30/15   Historical Provider, MD   Social History   Social History  . Marital Status: Married    Spouse Name: N/A  . Number of Children: N/A  . Years of Education: N/A   Occupational History  . Accountant    Social History Main Topics  . Smoking status: Current Every Day Smoker -- 0.50 packs/day for 28 years    Types: Cigarettes  . Smokeless tobacco: Never Used  . Alcohol Use: 16.8 oz/week    28 Glasses of wine per week     Comment: 07/29/2013 "3-4 glasses of wine qd", denies 01/27/2015  . Drug Use: No  . Sexual Activity: Yes    Birth Control/ Protection: Surgical   Other Topics Concern  . Not on file   Social History Narrative     Review of Systems  Musculoskeletal: Positive for back pain.  Neurological: Positive for weakness. Negative for numbness.       Objective:   Physical Exam  Constitutional: She is oriented to person, place, and time. She appears well-developed and well-nourished. No distress.  HENT:  Head: Normocephalic and atraumatic.  Mouth/Throat: Oropharynx is clear and moist. No oropharyngeal exudate.  Eyes: Pupils are equal, round, and reactive to light.  Neck: Neck supple.  Cardiovascular: Normal rate.   Pulmonary/Chest: Effort normal.  Abdominal: There is no CVA tenderness.    Musculoskeletal: She exhibits no edema.  Diffuse midline and right/left tenderness from the lower T-spine to the diffuse L-spine. Very guarded and tender along lower lumbar spine. Slightly guarded and diffuse tenderness, lower SI joint area. Reports positive seated straight leg raise bilaterally. No hip pain with internal or external rotation. Hips non-tender on palpation. No focal bony tenderness on legs.  Neurological: She is alert and oriented to person, place, and time. No cranial nerve deficit. She displays no Babinski's sign on the right side. She displays no Babinski's sign on the left side.  Reflex Scores:      Patellar reflexes are 2+ on the right side and 2+ on the left side.      Achilles reflexes are 2+ on the right side and 2+ on the left side. Skin: Skin is  warm and dry. No rash noted.  Psychiatric: She has a normal mood and affect. Her behavior is normal.  Nursing note and vitals reviewed.   Filed Vitals:   06/19/15 0820  BP: 132/88  Pulse: 87  Temp: 98.2 F (36.8 C)  TempSrc: Oral  Resp: 12  Height: 5\' 2"  (1.575 m)  Weight: 124 lb (56.246 kg)  SpO2: 96%    Dg Lumbar Spine Complete  06/19/2015  CLINICAL DATA:  [place].  Pain. EXAM: LUMBAR SPINE - COMPLETE 4+ VIEW COMPARISON:  None. FINDINGS: There is no evidence of lumbar spine fracture. Alignment is normal. Intervertebral disc spaces are maintained. Pancreatic calcifications. IMPRESSION: Negative. Electronically Signed   By: Staci Righter M.D.   On: 06/19/2015 09:25       Assessment & Plan:   NEKEIDRA PEERENBOOM is a 50 y.o. female Bilateral low back pain with sciatica, sciatica laterality unspecified - Plan: DG Lumbar Spine Complete, HYDROcodone-acetaminophen (NORCO/VICODIN) 5-325 MG tablet, cyclobenzaprine (FLEXERIL) 5 MG tablet  Contusion, back, unspecified laterality, initial encounter - Plan: DG Lumbar Spine Complete, HYDROcodone-acetaminophen (NORCO/VICODIN) 5-325 MG tablet, cyclobenzaprine (FLEXERIL) 5 MG tablet    Low back contusion, low back strain with possible component of sciatica. Denies persistent incontinence or saddle anesthesia, and does have equal her service strength on testing, but guarded due to pain in back.  -Flexeril 5 mg every 8 hours when necessary, hydrocodone 5/325 one every 6 hours when necessary pain. Side effects discussed and cautioned with comminution these medications. With history of coronary disease and bowel issues, chronic aspirin and Plavix use, will hold on NSAIDs for right now.  -Recheck in the next 3-4 days if not improving, sooner if worse. ER precautions discussed if any bowel or bladder incontinence, persistent weakness of legs, or saddle anesthesia. Understanding expressed.  Meds ordered this encounter  Medications  . metoprolol (LOPRESSOR) 100 MG tablet    Sig:     Refill:  0  . HYDROcodone-acetaminophen (NORCO/VICODIN) 5-325 MG tablet    Sig: Take 1 tablet by mouth every 6 (six) hours as needed for moderate pain.    Dispense:  15 tablet    Refill:  0  . cyclobenzaprine (FLEXERIL) 5 MG tablet    Sig: 1 pill by mouth up to every 8 hours as needed. Start with one pill by mouth each bedtime as needed due to sedation    Dispense:  15 tablet    Refill:  0   Patient Instructions  I do not see any broken bones on your x-rays today. Your reflexes and strength appear to be okay as well on exam. I suspect you have a contusion to low back with secondary muscle spasm and possible pinched nerve. You can take Flexeril every 8 hours as needed for muscle spasm, and hydrocodone up to every 6 hours if needed for pain. Be careful combining these as both can cause sedation. Heat or ice to affected area and gentle range of motion as tolerated. If any loss of strength in your legs, stool or urine incontinence, or decreased sensation in the groin, go to the emergency room. Follow-up in the next 3-4 days if not improving to decide if MRI, physical therapy, or orthopedic evaluation is  needed.  Return to the clinic or go to the nearest emergency room if any of your symptoms worsen or new symptoms occur.   Low Back Strain With Rehab A strain is an injury in which a tendon or muscle is torn. The muscles and tendons of  the lower back are vulnerable to strains. However, these muscles and tendons are very strong and require a great force to be injured. Strains are classified into three categories. Grade 1 strains cause pain, but the tendon is not lengthened. Grade 2 strains include a lengthened ligament, due to the ligament being stretched or partially ruptured. With grade 2 strains there is still function, although the function may be decreased. Grade 3 strains involve a complete tear of the tendon or muscle, and function is usually impaired. SYMPTOMS   Pain in the lower back.  Pain that affects one side more than the other.  Pain that gets worse with movement and may be felt in the hip, buttocks, or back of the thigh.  Muscle spasms of the muscles in the back.  Swelling along the muscles of the back.  Loss of strength of the back muscles.  Crackling sound (crepitation) when the muscles are touched. CAUSES  Lower back strains occur when a force is placed on the muscles or tendons that is greater than they can handle. Common causes of injury include:  Prolonged overuse of the muscle-tendon units in the lower back, usually from incorrect posture.  A single violent injury or force applied to the back. RISK INCREASES WITH:  Sports that involve twisting forces on the spine or a lot of bending at the waist (football, rugby, weightlifting, bowling, golf, tennis, speed skating, racquetball, swimming, running, gymnastics, diving).  Poor strength and flexibility.  Failure to warm up properly before activity.  Family history of lower back pain or disk disorders.  Previous back injury or surgery (especially fusion).  Poor posture with lifting, especially heavy  objects.  Prolonged sitting, especially with poor posture. PREVENTION   Learn and use proper posture when sitting or lifting (maintain proper posture when sitting, lift using the knees and legs, not at the waist).  Warm up and stretch properly before activity.  Allow for adequate recovery between workouts.  Maintain physical fitness:  Strength, flexibility, and endurance.  Cardiovascular fitness. PROGNOSIS  If treated properly, lower back strains usually heal within 6 weeks. RELATED COMPLICATIONS   Recurring symptoms, resulting in a chronic problem.  Chronic inflammation, scarring, and partial muscle-tendon tear.  Delayed healing or resolution of symptoms.  Prolonged disability. TREATMENT  Treatment first involves the use of ice and medicine, to reduce pain and inflammation. The use of strengthening and stretching exercises may help reduce pain with activity. These exercises may be performed at home or with a therapist. Severe injuries may require referral to a therapist for further evaluation and treatment, such as ultrasound. Your caregiver may advise that you wear a back brace or corset, to help reduce pain and discomfort. Often, prolonged bed rest results in greater harm then benefit. Corticosteroid injections may be recommended. However, these should be reserved for the most serious cases. It is important to avoid using your back when lifting objects. At night, sleep on your back on a firm mattress with a pillow placed under your knees. If non-surgical treatment is unsuccessful, surgery may be needed.  MEDICATION   If pain medicine is needed, nonsteroidal anti-inflammatory medicines (aspirin and ibuprofen), or other minor pain relievers (acetaminophen), are often advised.  Do not take pain medicine for 7 days before surgery.  Prescription pain relievers may be given, if your caregiver thinks they are needed. Use only as directed and only as much as you need.  Ointments  applied to the skin may be helpful.  Corticosteroid injections may be  given by your caregiver. These injections should be reserved for the most serious cases, because they may only be given a certain number of times. HEAT AND COLD  Cold treatment (icing) should be applied for 10 to 15 minutes every 2 to 3 hours for inflammation and pain, and immediately after activity that aggravates your symptoms. Use ice packs or an ice massage.  Heat treatment may be used before performing stretching and strengthening activities prescribed by your caregiver, physical therapist, or athletic trainer. Use a heat pack or a warm water soak. SEEK MEDICAL CARE IF:   Symptoms get worse or do not improve in 2 to 4 weeks, despite treatment.  You develop numbness, weakness, or loss of bowel or bladder function.  New, unexplained symptoms develop. (Drugs used in treatment may produce side effects.) EXERCISES  RANGE OF MOTION (ROM) AND STRETCHING EXERCISES - Low Back Strain Most people with lower back pain will find that their symptoms get worse with excessive bending forward (flexion) or arching at the lower back (extension). The exercises which will help resolve your symptoms will focus on the opposite motion.  Your physician, physical therapist or athletic trainer will help you determine which exercises will be most helpful to resolve your lower back pain. Do not complete any exercises without first consulting with your caregiver. Discontinue any exercises which make your symptoms worse until you speak to your caregiver.  If you have pain, numbness or tingling which travels down into your buttocks, leg or foot, the goal of the therapy is for these symptoms to move closer to your back and eventually resolve. Sometimes, these leg symptoms will get better, but your lower back pain may worsen. This is typically an indication of progress in your rehabilitation. Be very alert to any changes in your symptoms and the activities  in which you participated in the 24 hours prior to the change. Sharing this information with your caregiver will allow him/her to most efficiently treat your condition.  These exercises may help you when beginning to rehabilitate your injury. Your symptoms may resolve with or without further involvement from your physician, physical therapist or athletic trainer. While completing these exercises, remember:  Restoring tissue flexibility helps normal motion to return to the joints. This allows healthier, less painful movement and activity.  An effective stretch should be held for at least 30 seconds.  A stretch should never be painful. You should only feel a gentle lengthening or release in the stretched tissue. FLEXION RANGE OF MOTION AND STRETCHING EXERCISES: STRETCH - Flexion, Single Knee to Chest   Lie on a firm bed or floor with both legs extended in front of you.  Keeping one leg in contact with the floor, bring your opposite knee to your chest. Hold your leg in place by either grabbing behind your thigh or at your knee.  Pull until you feel a gentle stretch in your lower back. Hold __________ seconds.  Slowly release your grasp and repeat the exercise with the opposite side. Repeat __________ times. Complete this exercise __________ times per day.  STRETCH - Flexion, Double Knee to Chest   Lie on a firm bed or floor with both legs extended in front of you.  Keeping one leg in contact with the floor, bring your opposite knee to your chest.  Tense your stomach muscles to support your back and then lift your other knee to your chest. Hold your legs in place by either grabbing behind your thighs or at your knees.  Pull both knees toward your chest until you feel a gentle stretch in your lower back. Hold __________ seconds.  Tense your stomach muscles and slowly return one leg at a time to the floor. Repeat __________ times. Complete this exercise __________ times per day.  STRETCH -  Low Trunk Rotation  Lie on a firm bed or floor. Keeping your legs in front of you, bend your knees so they are both pointed toward the ceiling and your feet are flat on the floor.  Extend your arms out to the side. This will stabilize your upper body by keeping your shoulders in contact with the floor.  Gently and slowly drop both knees together to one side until you feel a gentle stretch in your lower back. Hold for __________ seconds.  Tense your stomach muscles to support your lower back as you bring your knees back to the starting position. Repeat the exercise to the other side. Repeat __________ times. Complete this exercise __________ times per day  EXTENSION RANGE OF MOTION AND FLEXIBILITY EXERCISES: STRETCH - Extension, Prone on Elbows   Lie on your stomach on the floor, a bed will be too soft. Place your palms about shoulder width apart and at the height of your head.  Place your elbows under your shoulders. If this is too painful, stack pillows under your chest.  Allow your body to relax so that your hips drop lower and make contact more completely with the floor.  Hold this position for __________ seconds.  Slowly return to lying flat on the floor. Repeat __________ times. Complete this exercise __________ times per day.  RANGE OF MOTION - Extension, Prone Press Ups  Lie on your stomach on the floor, a bed will be too soft. Place your palms about shoulder width apart and at the height of your head.  Keeping your back as relaxed as possible, slowly straighten your elbows while keeping your hips on the floor. You may adjust the placement of your hands to maximize your comfort. As you gain motion, your hands will come more underneath your shoulders.  Hold this position __________ seconds.  Slowly return to lying flat on the floor. Repeat __________ times. Complete this exercise __________ times per day.  RANGE OF MOTION- Quadruped, Neutral Spine   Assume a hands and knees  position on a firm surface. Keep your hands under your shoulders and your knees under your hips. You may place padding under your knees for comfort.  Drop your head and point your tail bone toward the ground below you. This will round out your lower back like an angry cat. Hold this position for __________ seconds.  Slowly lift your head and release your tail bone so that your back sags into a large arch, like an old horse.  Hold this position for __________ seconds.  Repeat this until you feel limber in your lower back.  Now, find your "sweet spot." This will be the most comfortable position somewhere between the two previous positions. This is your neutral spine. Once you have found this position, tense your stomach muscles to support your lower back.  Hold this position for __________ seconds. Repeat __________ times. Complete this exercise __________ times per day.  STRENGTHENING EXERCISES - Low Back Strain These exercises may help you when beginning to rehabilitate your injury. These exercises should be done near your "sweet spot." This is the neutral, low-back arch, somewhere between fully rounded and fully arched, that is your least painful position. When performed in  this safe range of motion, these exercises can be used for people who have either a flexion or extension based injury. These exercises may resolve your symptoms with or without further involvement from your physician, physical therapist or athletic trainer. While completing these exercises, remember:   Muscles can gain both the endurance and the strength needed for everyday activities through controlled exercises.  Complete these exercises as instructed by your physician, physical therapist or athletic trainer. Increase the resistance and repetitions only as guided.  You may experience muscle soreness or fatigue, but the pain or discomfort you are trying to eliminate should never worsen during these exercises. If this pain  does worsen, stop and make certain you are following the directions exactly. If the pain is still present after adjustments, discontinue the exercise until you can discuss the trouble with your caregiver. STRENGTHENING - Deep Abdominals, Pelvic Tilt  Lie on a firm bed or floor. Keeping your legs in front of you, bend your knees so they are both pointed toward the ceiling and your feet are flat on the floor.  Tense your lower abdominal muscles to press your lower back into the floor. This motion will rotate your pelvis so that your tail bone is scooping upwards rather than pointing at your feet or into the floor.  With a gentle tension and even breathing, hold this position for __________ seconds. Repeat __________ times. Complete this exercise __________ times per day.  STRENGTHENING - Abdominals, Crunches   Lie on a firm bed or floor. Keeping your legs in front of you, bend your knees so they are both pointed toward the ceiling and your feet are flat on the floor. Cross your arms over your chest.  Slightly tip your chin down without bending your neck.  Tense your abdominals and slowly lift your trunk high enough to just clear your shoulder blades. Lifting higher can put excessive stress on the lower back and does not further strengthen your abdominal muscles.  Control your return to the starting position. Repeat __________ times. Complete this exercise __________ times per day.  STRENGTHENING - Quadruped, Opposite UE/LE Lift   Assume a hands and knees position on a firm surface. Keep your hands under your shoulders and your knees under your hips. You may place padding under your knees for comfort.  Find your neutral spine and gently tense your abdominal muscles so that you can maintain this position. Your shoulders and hips should form a rectangle that is parallel with the floor and is not twisted.  Keeping your trunk steady, lift your right hand no higher than your shoulder and then your  left leg no higher than your hip. Make sure you are not holding your breath. Hold this position __________ seconds.  Continuing to keep your abdominal muscles tense and your back steady, slowly return to your starting position. Repeat with the opposite arm and leg. Repeat __________ times. Complete this exercise __________ times per day.  STRENGTHENING - Lower Abdominals, Double Knee Lift  Lie on a firm bed or floor. Keeping your legs in front of you, bend your knees so they are both pointed toward the ceiling and your feet are flat on the floor.  Tense your abdominal muscles to brace your lower back and slowly lift both of your knees until they come over your hips. Be certain not to hold your breath.  Hold __________ seconds. Using your abdominal muscles, return to the starting position in a slow and controlled manner. Repeat __________ times. Complete  this exercise __________ times per day.  POSTURE AND BODY MECHANICS CONSIDERATIONS - Low Back Strain Keeping correct posture when sitting, standing or completing your activities will reduce the stress put on different body tissues, allowing injured tissues a chance to heal and limiting painful experiences. The following are general guidelines for improved posture. Your physician or physical therapist will provide you with any instructions specific to your needs. While reading these guidelines, remember:  The exercises prescribed by your provider will help you have the flexibility and strength to maintain correct postures.  The correct posture provides the best environment for your joints to work. All of your joints have less wear and tear when properly supported by a spine with good posture. This means you will experience a healthier, less painful body.  Correct posture must be practiced with all of your activities, especially prolonged sitting and standing. Correct posture is as important when doing repetitive low-stress activities (typing) as it  is when doing a single heavy-load activity (lifting). RESTING POSITIONS Consider which positions are most painful for you when choosing a resting position. If you have pain with flexion-based activities (sitting, bending, stooping, squatting), choose a position that allows you to rest in a less flexed posture. You would want to avoid curling into a fetal position on your side. If your pain worsens with extension-based activities (prolonged standing, working overhead), avoid resting in an extended position such as sleeping on your stomach. Most people will find more comfort when they rest with their spine in a more neutral position, neither too rounded nor too arched. Lying on a non-sagging bed on your side with a pillow between your knees, or on your back with a pillow under your knees will often provide some relief. Keep in mind, being in any one position for a prolonged period of time, no matter how correct your posture, can still lead to stiffness. PROPER SITTING POSTURE In order to minimize stress and discomfort on your spine, you must sit with correct posture. Sitting with good posture should be effortless for a healthy body. Returning to good posture is a gradual process. Many people can work toward this most comfortably by using various supports until they have the flexibility and strength to maintain this posture on their own. When sitting with proper posture, your ears will fall over your shoulders and your shoulders will fall over your hips. You should use the back of the chair to support your upper back. Your lower back will be in a neutral position, just slightly arched. You may place a small pillow or folded towel at the base of your lower back for support.  When working at a desk, create an environment that supports good, upright posture. Without extra support, muscles tire, which leads to excessive strain on joints and other tissues. Keep these recommendations in mind: CHAIR:  A chair should  be able to slide under your desk when your back makes contact with the back of the chair. This allows you to work closely.  The chair's height should allow your eyes to be level with the upper part of your monitor and your hands to be slightly lower than your elbows. BODY POSITION  Your feet should make contact with the floor. If this is not possible, use a foot rest.  Keep your ears over your shoulders. This will reduce stress on your neck and lower back. INCORRECT SITTING POSTURES  If you are feeling tired and unable to assume a healthy sitting posture, do not slouch  or slump. This puts excessive strain on your back tissues, causing more damage and pain. Healthier options include:  Using more support, like a lumbar pillow.  Switching tasks to something that requires you to be upright or walking.  Talking a brief walk.  Lying down to rest in a neutral-spine position. PROLONGED STANDING WHILE SLIGHTLY LEANING FORWARD  When completing a task that requires you to lean forward while standing in one place for a long time, place either foot up on a stationary 2-4 inch high object to help maintain the best posture. When both feet are on the ground, the lower back tends to lose its slight inward curve. If this curve flattens (or becomes too large), then the back and your other joints will experience too much stress, tire more quickly, and can cause pain. CORRECT STANDING POSTURES Proper standing posture should be assumed with all daily activities, even if they only take a few moments, like when brushing your teeth. As in sitting, your ears should fall over your shoulders and your shoulders should fall over your hips. You should keep a slight tension in your abdominal muscles to brace your spine. Your tailbone should point down to the ground, not behind your body, resulting in an over-extended swayback posture.  INCORRECT STANDING POSTURES  Common incorrect standing postures include a forward head,  locked knees and/or an excessive swayback. WALKING Walk with an upright posture. Your ears, shoulders and hips should all line-up. PROLONGED ACTIVITY IN A FLEXED POSITION When completing a task that requires you to bend forward at your waist or lean over a low surface, try to find a way to stabilize 3 out of 4 of your limbs. You can place a hand or elbow on your thigh or rest a knee on the surface you are reaching across. This will provide you more stability so that your muscles do not fatigue as quickly. By keeping your knees relaxed, or slightly bent, you will also reduce stress across your lower back. CORRECT LIFTING TECHNIQUES DO :   Assume a wide stance. This will provide you more stability and the opportunity to get as close as possible to the object which you are lifting.  Tense your abdominals to brace your spine. Bend at the knees and hips. Keeping your back locked in a neutral-spine position, lift using your leg muscles. Lift with your legs, keeping your back straight.  Test the weight of unknown objects before attempting to lift them.  Try to keep your elbows locked down at your sides in order get the best strength from your shoulders when carrying an object.  Always ask for help when lifting heavy or awkward objects. INCORRECT LIFTING TECHNIQUES DO NOT:   Lock your knees when lifting, even if it is a small object.  Bend and twist. Pivot at your feet or move your feet when needing to change directions.  Assume that you can safely pick up even a paper clip without proper posture.   This information is not intended to replace advice given to you by your health care provider. Make sure you discuss any questions you have with your health care provider.   Document Released: 03/12/2005 Document Revised: 04/02/2014 Document Reviewed: 06/24/2008 Elsevier Interactive Patient Education 2016 [employer] Back Pain, Adult Back pain is very common in adults.The cause of back pain is  rarely dangerous and the pain often gets better over time.The cause of your back pain may not be known. Some common causes of back pain include:  Strain of the muscles or ligaments supporting the spine.  Wear and tear (degeneration) of the spinal disks.  Arthritis.  Direct injury to the back. For many people, back pain may return. Since back pain is rarely dangerous, most people can learn to manage this condition on their own. HOME CARE INSTRUCTIONS Watch your back pain for any changes. The following actions may help to lessen any discomfort you are feeling:  Remain active. It is stressful on your back to sit or stand in one place for long periods of time. Do not sit, drive, or stand in one place for more than 30 minutes at a time. Take short walks on even surfaces as soon as you are able.Try to increase the length of time you walk each day.  Exercise regularly as directed by your health care provider. Exercise helps your back heal faster. It also helps avoid future injury by keeping your muscles strong and flexible.  Do not stay in bed.Resting more than 1-2 days can delay your recovery.  Pay attention to your body when you bend and lift. The most comfortable positions are those that put less stress on your recovering back. Always use proper lifting techniques, including:  Bending your knees.  Keeping the load close to your body.  Avoiding twisting.  Find a comfortable position to sleep. Use a firm mattress and lie on your side with your knees slightly bent. If you lie on your back, put a pillow under your knees.  Avoid feeling anxious or stressed.Stress increases muscle tension and can worsen back pain.It is important to recognize when you are anxious or stressed and learn ways to manage it, such as with exercise.  Take medicines only as directed by your health care provider. Over-the-counter medicines to reduce pain and inflammation are often the most helpful.Your health care  provider may prescribe muscle relaxant drugs.These medicines help dull your pain so you can more quickly return to your normal activities and healthy exercise.  Apply ice to the injured area:  Put ice in a plastic bag.  Place a towel between your skin and the bag.  Leave the ice on for 20 minutes, 2-3 times a day for the first 2-3 days. After that, ice and heat may be alternated to reduce pain and spasms.  Maintain a healthy weight. Excess weight puts extra stress on your back and makes it difficult to maintain good posture. SEEK MEDICAL CARE IF:  You have pain that is not relieved with rest or medicine.  You have increasing pain going down into the legs or buttocks.  You have pain that does not improve in one week.  You have night pain.  You lose weight.  You have a fever or chills. SEEK IMMEDIATE MEDICAL CARE IF:   You develop new bowel or bladder control problems.  You have unusual weakness or numbness in your arms or legs.  You develop nausea or vomiting.  You develop abdominal pain.  You feel faint.   This information is not intended to replace advice given to you by your health care provider. Make sure you discuss any questions you have with your health care provider.   Document Released: 03/12/2005 Document Revised: 04/02/2014 Document Reviewed: 07/14/2013 Elsevier Interactive Patient Education [employer].     I personally performed the services described in this documentation, which was scribed in my presence. The recorded information has been reviewed and considered, and addended by me as needed.

## 2015-06-30 MED FILL — HYDROCODON-APAP 5-325: 5-325 | 8 days supply | Qty: 30 | Fill #0

## 2015-07-15 DIAGNOSIS — J449 Chronic obstructive pulmonary disease, unspecified: Secondary | ICD-10-CM | POA: Diagnosis not present

## 2015-07-28 DIAGNOSIS — I2510 Atherosclerotic heart disease of native coronary artery without angina pectoris: Secondary | ICD-10-CM | POA: Diagnosis not present

## 2015-07-28 DIAGNOSIS — F1020 Alcohol dependence, uncomplicated: Secondary | ICD-10-CM | POA: Diagnosis not present

## 2015-07-28 DIAGNOSIS — I119 Hypertensive heart disease without heart failure: Secondary | ICD-10-CM | POA: Diagnosis not present

## 2015-07-28 DIAGNOSIS — R809 Proteinuria, unspecified: Secondary | ICD-10-CM | POA: Diagnosis not present

## 2015-07-28 DIAGNOSIS — Z7984 Long term (current) use of oral hypoglycemic drugs: Secondary | ICD-10-CM | POA: Diagnosis not present

## 2015-07-28 DIAGNOSIS — F17200 Nicotine dependence, unspecified, uncomplicated: Secondary | ICD-10-CM | POA: Diagnosis not present

## 2015-07-28 DIAGNOSIS — E1129 Type 2 diabetes mellitus with other diabetic kidney complication: Secondary | ICD-10-CM | POA: Diagnosis not present

## 2015-07-28 MED FILL — HYDROCODON-APAP 5-325: 5-325 | 8 days supply | Qty: 30 | Fill #0

## 2015-08-14 DIAGNOSIS — J449 Chronic obstructive pulmonary disease, unspecified: Secondary | ICD-10-CM | POA: Diagnosis not present

## 2015-08-25 DIAGNOSIS — I119 Hypertensive heart disease without heart failure: Secondary | ICD-10-CM | POA: Diagnosis not present

## 2015-08-25 MED FILL — CLOPIDOGREL 75 MG TABLET: 75 | 90 days supply | Qty: 90 | Fill #0

## 2015-08-25 MED FILL — metFORMIN HCL 500 MG TABS: 500 | 90 days supply | Qty: 90 | Fill #0

## 2015-08-25 MED FILL — METOPROLOL TARTRATE 100 MG: 100 | 90 days supply | Qty: 90 | Fill #0

## 2015-08-25 MED FILL — HYDROCHLOROTHIAZIDE 25 MG T: 25 | 90 days supply | Qty: 90 | Fill #0

## 2015-08-25 MED FILL — LOSARTAN POTASSIUM 50 MG TA: 50 | 90 days supply | Qty: 90 | Fill #0

## 2015-08-25 MED FILL — PRAVASTATIN SODIUM 20 MG TA: 20 | 90 days supply | Qty: 90 | Fill #0

## 2015-08-25 MED FILL — HYDROCODON-APAP 5-325: 5-325 | 8 days supply | Qty: 30 | Fill #0

## 2015-09-14 DIAGNOSIS — J449 Chronic obstructive pulmonary disease, unspecified: Secondary | ICD-10-CM | POA: Diagnosis not present

## 2015-09-22 MED FILL — HYDROCODON-APAP 5-325: 5-325 | 8 days supply | Qty: 30 | Fill #0

## 2015-10-12 ENCOUNTER — Ambulatory Visit (HOSPITAL_COMMUNITY)
Admission: EM | Admit: 2015-10-12 | Discharge: 2015-10-12 | Disposition: A | Discharge status: Home / Self Care | Payer: PRIVATE HEALTH INSURANCE | Attending: Emergency Medicine | Admitting: Emergency Medicine

## 2015-10-12 ENCOUNTER — Encounter (HOSPITAL_COMMUNITY): Payer: Self-pay | Admitting: Emergency Medicine

## 2015-10-12 DIAGNOSIS — E669 Obesity, unspecified: Secondary | ICD-10-CM | POA: Insufficient documentation

## 2015-10-12 DIAGNOSIS — Z7984 Long term (current) use of oral hypoglycemic drugs: Secondary | ICD-10-CM | POA: Insufficient documentation

## 2015-10-12 DIAGNOSIS — E119 Type 2 diabetes mellitus without complications: Secondary | ICD-10-CM | POA: Diagnosis not present

## 2015-10-12 DIAGNOSIS — G4733 Obstructive sleep apnea (adult) (pediatric): Secondary | ICD-10-CM | POA: Insufficient documentation

## 2015-10-12 DIAGNOSIS — Z7982 Long term (current) use of aspirin: Secondary | ICD-10-CM | POA: Insufficient documentation

## 2015-10-12 DIAGNOSIS — F1010 Alcohol abuse, uncomplicated: Secondary | ICD-10-CM | POA: Diagnosis not present

## 2015-10-12 DIAGNOSIS — R1084 Generalized abdominal pain: Secondary | ICD-10-CM | POA: Diagnosis not present

## 2015-10-12 DIAGNOSIS — I110 Hypertensive heart disease with heart failure: Secondary | ICD-10-CM | POA: Diagnosis not present

## 2015-10-12 DIAGNOSIS — E7800 Pure hypercholesterolemia, unspecified: Secondary | ICD-10-CM | POA: Insufficient documentation

## 2015-10-12 DIAGNOSIS — F17210 Nicotine dependence, cigarettes, uncomplicated: Secondary | ICD-10-CM | POA: Insufficient documentation

## 2015-10-12 DIAGNOSIS — I509 Heart failure, unspecified: Secondary | ICD-10-CM | POA: Diagnosis not present

## 2015-10-12 DIAGNOSIS — Z79899 Other long term (current) drug therapy: Secondary | ICD-10-CM | POA: Insufficient documentation

## 2015-10-12 DIAGNOSIS — I2510 Atherosclerotic heart disease of native coronary artery without angina pectoris: Secondary | ICD-10-CM | POA: Diagnosis not present

## 2015-10-12 DIAGNOSIS — K860 Alcohol-induced chronic pancreatitis: Secondary | ICD-10-CM | POA: Diagnosis not present

## 2015-10-12 DIAGNOSIS — D869 Sarcoidosis, unspecified: Secondary | ICD-10-CM | POA: Insufficient documentation

## 2015-10-12 DIAGNOSIS — R109 Unspecified abdominal pain: Secondary | ICD-10-CM | POA: Diagnosis present

## 2015-10-12 LAB — ETHANOL

## 2015-10-12 LAB — POCT URINALYSIS DIP (DEVICE)
GLUCOSE, UA: 100 mg/dL — AB
Hgb urine dipstick: NEGATIVE
KETONES UR: NEGATIVE mg/dL
LEUKOCYTES UA: NEGATIVE
Nitrite: NEGATIVE
Protein, ur: 30 mg/dL — AB
Urobilinogen, UA: 1 mg/dL (ref 0.0–1.0)
pH: 5.5 (ref 5.0–8.0)

## 2015-10-12 LAB — URINALYSIS, ROUTINE W REFLEX MICROSCOPIC
Glucose, UA: 250 mg/dL — AB
Hgb urine dipstick: NEGATIVE
KETONES UR: 15 mg/dL — AB
LEUKOCYTES UA: NEGATIVE
NITRITE: NEGATIVE
PH: 5.5 (ref 5.0–8.0)
Protein, ur: NEGATIVE mg/dL
Specific Gravity, Urine: 1.033 — ABNORMAL HIGH (ref 1.005–1.030)

## 2015-10-12 LAB — COMPREHENSIVE METABOLIC PANEL
ALBUMIN: 4 g/dL (ref 3.5–5.0)
ALT: 53 U/L (ref 14–54)
ANION GAP: 6 (ref 5–15)
AST: 76 U/L — ABNORMAL HIGH (ref 15–41)
Alkaline Phosphatase: 65 U/L (ref 38–126)
BUN: 12 mg/dL (ref 6–20)
CALCIUM: 10.1 mg/dL (ref 8.9–10.3)
CHLORIDE: 103 mmol/L (ref 101–111)
CO2: 29 mmol/L (ref 22–32)
Creatinine, Ser: 0.81 mg/dL (ref 0.44–1.00)
GFR calc non Af Amer: >60 mL/min (ref >60)
GLUCOSE: 81 mg/dL (ref 65–99)
POTASSIUM: 3.7 mmol/L (ref 3.5–5.1)
SODIUM: 138 mmol/L (ref 135–145)
Total Bilirubin: 1.1 mg/dL (ref 0.3–1.2)
Total Protein: 7.4 g/dL (ref 6.5–8.1)

## 2015-10-12 LAB — CBC WITH DIFFERENTIAL/PLATELET
BASOS PCT: 0 %
Basophils Absolute: 0 10*3/uL (ref 0.0–0.1)
EOS ABS: 0 10*3/uL (ref 0.0–0.7)
EOS PCT: 1 %
HCT: 46.9 % — ABNORMAL HIGH (ref 36.0–46.0)
Hemoglobin: 15.7 g/dL — ABNORMAL HIGH (ref 12.0–15.0)
LYMPHS ABS: 1.3 10*3/uL (ref 0.7–4.0)
Lymphocytes Relative: 23 %
MCH: 32.2 pg (ref 26.0–34.0)
MCHC: 33.5 g/dL (ref 30.0–36.0)
MCV: 96.1 fL (ref 78.0–100.0)
MONOS PCT: 11 %
Monocytes Absolute: 0.6 10*3/uL (ref 0.1–1.0)
NEUTROS PCT: 65 %
Neutro Abs: 3.6 10*3/uL (ref 1.7–7.7)
PLATELETS: 232 10*3/uL (ref 150–400)
RBC: 4.88 MIL/uL (ref 3.87–5.11)
RDW: 12.3 % (ref 11.5–15.5)
WBC: 5.5 10*3/uL (ref 4.0–10.5)

## 2015-10-12 LAB — LIPASE, BLOOD: Lipase: 20 U/L (ref 11–51)

## 2015-10-12 LAB — AMYLASE: AMYLASE: 59 U/L (ref 28–100)

## 2015-10-12 MED ORDER — HYDROCODONE-ACETAMINOPHEN 7.5-325 MG PO TABS: 1.0000 | ORAL_TABLET | Freq: Four times a day (QID) | ORAL | Status: DC | PRN

## 2015-10-12 MED ORDER — ONDANSETRON 4 MG PO TBDP: 4.0000 mg | ORAL_TABLET | Freq: Three times a day (TID) | ORAL | Status: DC | PRN

## 2015-10-12 NOTE — ED Notes (Signed)
The patient presented to the [facility] with a complaint of abdominal pain that started 3 days ago that has now moved to her lower back. The patient also stated that her urine has had an odor.

## 2015-10-12 NOTE — Discharge Instructions (Signed)
Alcohol Abuse and Nutrition Alcohol abuse is any pattern of alcohol consumption that harms your health, relationships, or work. Alcohol abuse can affect how your body breaks down and absorbs nutrients from food by causing your liver to work abnormally. Additionally, many people who abuse alcohol do not eat enough carbohydrates, protein, fat, vitamins, and minerals. This can cause poor nutrition (malnutrition) and a lack of nutrients (nutrient deficiencies), which can lead to further complications. Nutrients that are commonly lacking (deficient) among people who abuse alcohol include:  Vitamins.  Vitamin A. This is stored in your liver. It is important for your vision, metabolism, and ability to fight off infections (immunity).  B vitamins. These include vitamins such as folate, thiamin, and niacin. These are important in new cell growth and maintenance.  Vitamin C. This plays an important role in iron absorption, wound healing, and immunity.  Vitamin D. This is produced by your liver, but you can also get vitamin D from food. Vitamin D is necessary for your body to absorb and use calcium.  Minerals.  Calcium. This is important for your bones and your heart and blood vessel (cardiovascular) function.  Iron. This is important for blood, muscle, and nervous system functioning.  Magnesium. This plays an important role in muscle and nerve function, and it helps to control blood sugar and blood pressure.  Zinc. This is important for the normal function of your nervous system and digestive system (gastrointestinal tract). Nutrition is an essential component of therapy for alcohol abuse. Your health care provider or dietitian will work with you to design a plan that can help restore nutrients to your body and prevent potential complications. WHAT IS MY PLAN? Your dietitian may develop a specific diet plan that is based on your condition and any other complications you may have. A diet plan will  commonly include:  A balanced diet.  Grains: 6-8 oz per day.  Vegetables: 2-3 cups per day.  Fruits: 1-2 cups per day.  Meat and other protein: 5-6 oz per day.  Dairy: 2-3 cups per day.  Vitamin and mineral supplements. WHAT DO I NEED TO KNOW ABOUT ALCOHOL AND NUTRITION?  Consume foods that are high in antioxidants, such as grapes, berries, nuts, green tea, and dark green and orange vegetables. This can help to counteract some of the stress that is placed on your liver by consuming alcohol.  Avoid food and drinks that are high in fat and sugar. Foods such as sugared soft drinks, salty snack foods, and candy contain empty calories. This means that they lack important nutrients such as protein, fiber, and vitamins.  Eat frequent meals and snacks. Try to eat 5-6 small meals each day.  Eat a variety of fresh fruits and vegetables each day. This will help you get plenty of water, fiber, and vitamins in your diet.  Drink plenty of water and other clear fluids. Try to drink at least 48-64 oz (1.5-2 L) of water per day.  If you are a vegetarian, eat a variety of protein-rich foods. Pair whole grains with plant-based proteins at meals and snacks to obtain the greatest nutrient benefit from your food. For example, eat rice with beans, put peanut butter on whole-grain toast, or eat oatmeal with sunflower seeds.  Soak beans and whole grains overnight before cooking. This can help your body to absorb the nutrients more easily.  Include foods fortified with vitamins and minerals in your diet. Commonly fortified foods include milk, orange juice, cereal, and bread.  If you  are malnourished, your dietitian may recommend a high-protein, high-calorie diet. This may include:  2,000-3,000 calories (kilocalories) per day.  70-100 grams of protein per day.  Your health care provider may recommend a complete nutritional supplement beverage. This can help to restore calories, protein, and vitamins to  your body. Depending on your condition, you may be advised to consume this instead of or in addition to meals.  Limit your intake of caffeine. Replace drinks like coffee and black tea with decaffeinated coffee and herbal tea.  Eat a variety of foods that are high in omega fatty acids. These include fish, nuts and seeds, and soybeans. These foods may help your liver to recover and may also stabilize your mood.  Certain medicines may cause changes in your appetite, taste, and weight. Work with your health care provider and dietitian to make any adjustments to your medicines and diet plan.  Include other healthy lifestyle choices in your daily routine.  Be physically active.  Get enough sleep.  Spend time doing activities that you enjoy.  If you are unable to take in enough food and calories by mouth, your health care provider may recommend a feeding tube. This is a tube that passes through your nose and throat, directly into your stomach. Nutritional supplement beverages can be given to you through the feeding tube to help you get the nutrients you need.  Take vitamin or mineral supplements as recommended by your health care provider. WHAT FOODS CAN I EAT? Grains Enriched pasta. Enriched rice. Fortified whole-grain bread. Fortified whole-grain cereal. Barley. Brown rice. Quinoa. [place]. Vegetables All fresh, frozen, and canned vegetables. Spinach. Kale. Artichoke. Carrots. Winter squash and pumpkin. Sweet potatoes. Broccoli. Cabbage. Cucumbers. Tomatoes. Sweet peppers. Green beans. Peas. Corn. Fruits All fresh and frozen fruits. Berries. Grapes. Mango. Papaya. Guava. Cherries. Apples. Bananas. Peaches. Plums. Pineapple. Watermelon. Cantaloupe. Oranges. Avocado. Meats and Other Protein Sources Beef liver. Lean beef. Pork. Fresh and canned chicken. Fresh fish. Oysters. Sardines. Canned tuna. Shrimp. Eggs with yolks. Nuts and seeds. Peanut butter. Beans and lentils. Soybeans.  Tofu. Dairy Whole, low-fat, and nonfat milk. Whole, low-fat, and nonfat yogurt. Cottage cheese. Sour cream. Hard and soft cheeses. Beverages Water. Herbal tea. Decaffeinated coffee. Decaffeinated green tea. 100% fruit juice. 100% vegetable juice. Instant breakfast shakes. Condiments Ketchup. Mayonnaise. Mustard. Salad dressing. Barbecue sauce. Sweets and Desserts Sugar-free ice cream. Sugar-free pudding. Sugar-free gelatin. Fats and Oils Butter. Vegetable oil, flaxseed oil, olive oil, and walnut oil. Other Complete nutrition shakes. Protein bars. Sugar-free gum. The items listed above may not be a complete list of recommended foods or beverages. Contact your dietitian for more options. WHAT FOODS ARE NOT RECOMMENDED? Grains Sugar-sweetened breakfast cereals. Flavored instant oatmeal. Fried breads. Vegetables Breaded or deep-fried vegetables. Fruits Dried fruit with added sugar. Candied fruit. Canned fruit in syrup. Meats and Other Protein Sources Breaded or deep-fried meats. Dairy Flavored milks. Fried cheese curds or fried cheese sticks. Beverages Alcohol. Sugar-sweetened soft drinks. Sugar-sweetened tea. Caffeinated coffee and tea. Condiments Sugar. Honey. Agave nectar. Molasses. Sweets and Desserts Chocolate. Cake. Cookies. Candy. Other Potato chips. Pretzels. Salted nuts. Candied nuts. The items listed above may not be a complete list of foods and beverages to avoid. Contact your dietitian for more information.   This information is not intended to replace advice given to you by your health care provider. Make sure you discuss any questions you have with your health care provider.   Document Released: 01/04/2005 Document Revised: 04/02/2014 Document Reviewed: 10/13/2013 Elsevier Interactive Patient  Education 2016 [employer]. Acute Pancreatitis Acute pancreatitis is a disease in which the pancreas becomes suddenly irritated (inflamed). The pancreas is a large gland  behind your stomach. The pancreas makes enzymes that help digest food. The pancreas also makes 2 hormones that help control your blood sugar. Acute pancreatitis happens when the enzymes attack and damage the pancreas. Most attacks last a couple of days and can cause serious problems. HOME CARE  Follow your doctor's diet instructions. You may need to avoid alcohol and limit fat in your diet.  Eat small meals often.  Drink enough fluids to keep your pee (urine) clear or pale yellow.  Only take medicines as told by your doctor.  Avoid drinking alcohol if it caused your disease.  Do not smoke.  Get plenty of rest.  Check your blood sugar at home as told by your doctor.  Keep all doctor visits as told. GET HELP IF:  You do not get better as quickly as expected.  You have new or worsening symptoms.  You have lasting pain, weakness, or feel sick to your stomach (nauseous).  You get better and then have another pain attack. GET HELP RIGHT AWAY IF:   You are unable to eat or keep fluids down.  Your pain becomes severe.  You have a fever or lasting symptoms for more than 2 to 3 days.  You have a fever and your symptoms suddenly get worse.  Your skin or the white part of your eyes turn yellow (jaundice).  You throw up (vomit).  You feel dizzy, or you pass out (faint).  Your blood sugar is high (over 300 mg/dL). MAKE SURE YOU:   Understand these instructions.  Will watch your condition.  Will get help right away if you are not doing well or get worse.   This information is not intended to replace advice given to you by your health care provider. Make sure you discuss any questions you have with your health care provider.   Document Released: 08/29/2007 Document Revised: 04/02/2014 Document Reviewed: 06/21/2011 Elsevier Interactive Patient Education [employer].

## 2015-10-12 NOTE — ED Provider Notes (Signed)
CSN: [id number]     Arrival date & time 10/12/15  1820 History   First MD Initiated Contact with Patient 10/12/15 1900     Chief Complaint  Patient presents with  . Abdominal Pain  . Back Pain   (Consider location/radiation/quality/duration/timing/severity/associated sxs/prior Treatmen t) HPI  50 yo bf presents with above complaint that started 10 October 2015.  pmhx significant for etoh abuse and chronic pancreatitis.  Patient denies excessive etoh use recently.  Pain more in the epigastric area and radiates around to her back.  Constant but aggravated with movement.  Has had some nausea, and chills.  Dysuria and malodorous urine x 2 days. Urine dark color.  Denies chest pain, sob, fever, constipation, diarrhea, bloody stools, vaginal discharge. States that her current pain is similar to usual pancreatitis attacks but not as severe.    Past Medical History  Diagnosis Date  . Loss of weight   . Sarcoidosis ([place])   . Leiomyoma of uterus, unspecified   . Pure hypercholesterolemia   . Obesity, unspecified   . Chronic pancreatitis ([place])   . Coronary atherosclerosis of unspecified type of vessel, native or graft   . Hypertension   . Shortness of breath     none since stent 2 yrs ago  . CHF (congestive heart failure) ([place])   . OSA (obstructive sleep apnea)     no CPAP  . Type II diabetes mellitus ([place])   . [id number])     "weekly" (07/29/2013)   Past Surgical History  Procedure Laterality Date  . Coronary artery bypass graft  ~ 2000    CABG X3  . Tubal ligation  1997  . Hysteroscopy with novasure N/A 09/04/2012    Procedure: HYSTEROSCOPY WITH NOVASURE;  Surgeon: Mora Bellman, MD;  Location: [facility];  Service: Gynecology;  Laterality: N/A;  with removal intrauterine device  . Coronary angioplasty with stent placement  09/2009    "1"   Family History  Problem Relation Age of Onset  . Diabetes Mother   . Coronary artery disease Mother   . Heart disease Mother   . Hyperlipidemia  Mother    Social History  Substance Use Topics  . Smoking status: Current Every Day Smoker -- 0.50 packs/day for 28 years    Types: Cigarettes  . Smokeless tobacco: Never Used  . Alcohol Use: 16.8 oz/week    28 Glasses of wine per week     Comment: 07/29/2013 "3-4 glasses of wine qd", denies 01/27/2015   OB History    Gravida Para Term Preterm AB TAB SAB Ectopic Multiple Living   3 3 3       3      Review of Systems  Constitutional: Positive for chills and appetite change. Negative for fever.  Eyes: Negative.   Cardiovascular: Negative.   Gastrointestinal: Positive for nausea, abdominal pain and abdominal distention. Negative for vomiting, diarrhea, constipation, blood in stool, anal bleeding and rectal pain.  Endocrine: Negative.   Genitourinary: Positive for dysuria and flank pain. Negative for hematuria, vaginal bleeding, vaginal discharge and vaginal pain.  Musculoskeletal: Negative.   Neurological: Negative.   Psychiatric/Behavioral: Negative.     Allergies  Lisinopril  Home Medications   Prior to Admission medications   Medication Sig Start Date End Date Taking? Authorizing Provider  albuterol (PROVENTIL HFA;VENTOLIN HFA) 108 (90 BASE) MCG/ACT inhaler Inhale 2 puffs into the lungs every 6 (six) hours as needed for wheezing or shortness of breath. 08/13/13  Yes Collene Gobble, MD  aspirin 325 MG tablet Take 325 mg by mouth daily.    Yes Historical Provider, MD  clopidogrel (PLAVIX) 75 MG tablet Take 75 mg by mouth daily with breakfast.    Yes Historical Provider, MD  dicyclomine (BENTYL) 20 MG tablet Take 1 tablet (20 mg total) by mouth 2 (two) times daily. 02/18/15  Yes Olivia Canter Sam, PA-C  hydrochlorothiazide (HYDRODIURIL) 25 MG tablet Take 12.5 mg by mouth every morning.  08/15/12  Yes Oswald Hillock, MD  ibuprofen (ADVIL,MOTRIN) 200 MG tablet Take 400-600 mg by mouth every 6 (six) hours as needed for moderate pain.   Yes Historical Provider, MD  metFORMIN (GLUCOPHAGE) 500 MG  tablet Take 500 mg by mouth daily with breakfast. Take with food   Yes Historical Provider, MD  metoprolol (LOPRESSOR) 100 MG tablet  05/30/15  Yes Historical Provider, MD  pravastatin (PRAVACHOL) 20 MG tablet Take 20 mg by mouth daily.    Yes Historical Provider, MD  cyclobenzaprine (FLEXERIL) 5 MG tablet 1 pill by mouth up to every 8 hours as needed. Start with one pill by mouth each bedtime as needed due to sedation 06/19/15   Wendie Agreste, MD  HYDROcodone-acetaminophen (NORCO/VICODIN) 5-325 MG tablet Take 1 tablet by mouth every 6 (six) hours as needed for moderate pain. 06/19/15   Wendie Agreste, MD  ondansetron (ZOFRAN ODT) 4 MG disintegrating tablet Take 1 tablet (4 mg total) by mouth every 8 (eight) hours as needed for nausea or vomiting. 02/18/15   Anne Ng, PA-C   Meds Ordered and Administered this Visit  Medications - No data to display  BP 180/108 mmHg  Pulse 78  Temp(Src) 99.2 F (37.3 C) (Oral)  Resp 18  Ht 5\' 2"  (1.575 m)  Wt 135 lb (61.236 kg)  BMI 24.69 kg/m2  SpO2 98% No data found.   Physical Exam  Constitutional: She is oriented to person, place, and time. She appears distressed.  HENT:  Head: Normocephalic and atraumatic.  Eyes: EOM are normal. Pupils are equal, round, and reactive to light.  Neck: Normal range of motion.  Cardiovascular: Normal rate and regular rhythm.   Pulmonary/Chest: Effort normal and breath sounds normal. No respiratory distress.  Abdominal: Soft. She exhibits no mass. There is tenderness (diffuse abdominal tenderness more in the epigastric,  and right/left upper quadrants. ). There is rebound.  Musculoskeletal: Normal range of motion.  Neurological: She is alert and oriented to person, place, and time.  Skin: Skin is warm and dry. No rash noted. No erythema.  Psychiatric: She has a normal mood and affect.    ED Course  Procedures (including critical care time)  Labs Review Labs Reviewed  POCT URINALYSIS DIP (DEVICE) -  Abnormal; Notable for the following:    Glucose, UA 100 (*)    Bilirubin Urine SMALL (*)    Protein, ur 30 (*)    All other components within normal limits  URINE CULTURE  CBC WITH DIFFERENTIAL/PLATELET  COMPREHENSIVE METABOLIC PANEL  URINALYSIS, ROUTINE W REFLEX MICROSCOPIC (NOT AT [facility])  LIPASE, BLOOD  AMYLASE  ETHANOL    Imaging Review No results found.       MDM   1. Alcohol abuse, continuous   2. Generalized abdominal pain   3. Alcohol-induced chronic pancreatitis [facility])    Advised patient that we will attempt to manage her conservatively.  Given prescriptions for zofran (nausea)  and norco (pain).  Will start a clear liquid diet and keep hydrated.  Advance diet as  tolerated.  Recommend follow up visit with PCP Dr Merri Ray next week and told patient that if her symptoms worsen or do not improve within the next 1-2 days that she needs to go to the ER.  Patient voices understanding and agrees with treatment plan that was reviewed with my attending Dr Bridgett Larsson. Discussed with patient the importance of discontinuing alcohol consumption.  All questions answered.      Lanae Crumbly, PA-C 10/12/15 2012

## 2015-10-13 LAB — URINE CULTURE

## 2015-10-14 DIAGNOSIS — J449 Chronic obstructive pulmonary disease, unspecified: Secondary | ICD-10-CM | POA: Diagnosis not present

## 2015-11-03 MED FILL — HYDROCODON-APAP 5-325: 5-325 | 8 days supply | Qty: 30 | Fill #0

## 2015-11-14 DIAGNOSIS — J449 Chronic obstructive pulmonary disease, unspecified: Secondary | ICD-10-CM | POA: Diagnosis not present

## 2015-12-02 MED FILL — HYDROCODON-APAP 5-325: 5-325 | 8 days supply | Qty: 30 | Fill #0

## 2015-12-15 DIAGNOSIS — J449 Chronic obstructive pulmonary disease, unspecified: Secondary | ICD-10-CM | POA: Diagnosis not present

## 2015-12-29 MED FILL — HYDROCODON-APAP 5-325: 5-325 | 8 days supply | Qty: 30 | Fill #0

## 2016-01-14 DIAGNOSIS — J449 Chronic obstructive pulmonary disease, unspecified: Secondary | ICD-10-CM | POA: Diagnosis not present

## 2016-01-26 MED FILL — HYDROCODON-APAP 5-325: 5-325 | 8 days supply | Qty: 30 | Fill #0

## 2016-02-14 DIAGNOSIS — J449 Chronic obstructive pulmonary disease, unspecified: Secondary | ICD-10-CM | POA: Diagnosis not present

## 2016-02-22 MED FILL — CLOPIDOGREL 75 MG TABLET: 75 | 90 days supply | Qty: 90 | Fill #0

## 2016-02-22 MED FILL — HYDROCODON-APAP 5-325: 5-325 | 8 days supply | Qty: 30 | Fill #0

## 2016-02-22 MED FILL — HYDROCHLOROTHIAZIDE 25 MG T: 25 | 90 days supply | Qty: 90 | Fill #0

## 2016-02-22 MED FILL — PRAVASTATIN SODIUM 20 MG TA: 20 | 90 days supply | Qty: 90 | Fill #0

## 2016-02-22 MED FILL — metFORMIN HCL 500 MG TABS: 500 | 90 days supply | Qty: 90 | Fill #0

## 2016-02-22 MED FILL — LOSARTAN POTASSIUM 50 MG TA: 50 | 90 days supply | Qty: 90 | Fill #0

## 2016-02-22 MED FILL — METOPROLOL TARTRATE 100 MG: 100 | 90 days supply | Qty: 90 | Fill #0

## 2016-03-05 DIAGNOSIS — F17200 Nicotine dependence, unspecified, uncomplicated: Secondary | ICD-10-CM | POA: Diagnosis not present

## 2016-03-05 DIAGNOSIS — K861 Other chronic pancreatitis: Secondary | ICD-10-CM | POA: Diagnosis not present

## 2016-03-05 DIAGNOSIS — F1020 Alcohol dependence, uncomplicated: Secondary | ICD-10-CM | POA: Diagnosis not present

## 2016-03-05 DIAGNOSIS — R809 Proteinuria, unspecified: Secondary | ICD-10-CM | POA: Diagnosis not present

## 2016-03-05 DIAGNOSIS — Z7984 Long term (current) use of oral hypoglycemic drugs: Secondary | ICD-10-CM | POA: Diagnosis not present

## 2016-03-05 DIAGNOSIS — I119 Hypertensive heart disease without heart failure: Secondary | ICD-10-CM | POA: Diagnosis not present

## 2016-03-05 DIAGNOSIS — E1129 Type 2 diabetes mellitus with other diabetic kidney complication: Secondary | ICD-10-CM | POA: Diagnosis not present

## 2016-03-05 DIAGNOSIS — I2510 Atherosclerotic heart disease of native coronary artery without angina pectoris: Secondary | ICD-10-CM | POA: Diagnosis not present

## 2016-03-15 DIAGNOSIS — J449 Chronic obstructive pulmonary disease, unspecified: Secondary | ICD-10-CM | POA: Diagnosis not present

## 2016-03-22 MED FILL — HYDROCODON-APAP 5-325: 5-325 | 8 days supply | Qty: 30 | Fill #0

## 2016-04-15 DIAGNOSIS — J449 Chronic obstructive pulmonary disease, unspecified: Secondary | ICD-10-CM | POA: Diagnosis not present

## 2016-04-19 MED FILL — HYDROCODON-APAP 5-325: 5-325 | 8 days supply | Qty: 30 | Fill #0

## 2016-05-16 DIAGNOSIS — J449 Chronic obstructive pulmonary disease, unspecified: Secondary | ICD-10-CM | POA: Diagnosis not present

## 2016-05-17 MED FILL — HYDROCODON-APAP 5-325: 5-325 | 8 days supply | Qty: 30 | Fill #0

## 2016-06-11 MED FILL — HYDROCHLOROTHIAZIDE 25 MG T: 25 | 90 days supply | Qty: 90 | Fill #0

## 2016-06-11 MED FILL — metFORMIN HCL 500 MG TABS: 500 | 90 days supply | Qty: 90 | Fill #0

## 2016-06-11 MED FILL — CLOPIDOGREL 75 MG TABLET: 75 | 90 days supply | Qty: 90 | Fill #0

## 2016-06-11 MED FILL — LOSARTAN POTASSIUM 50 MG TA: 50 | 90 days supply | Qty: 90 | Fill #0

## 2016-06-11 MED FILL — METOPROLOL TARTRATE 100 MG: 100 | 90 days supply | Qty: 90 | Fill #0

## 2016-06-11 MED FILL — PRAVASTATIN SODIUM 20 MG TA: 20 | 90 days supply | Qty: 90 | Fill #0

## 2016-06-12 MED FILL — HYDROCODON-APAP 5-325: 5-325 | 7 days supply | Qty: 30 | Fill #0

## 2016-06-13 DIAGNOSIS — J449 Chronic obstructive pulmonary disease, unspecified: Secondary | ICD-10-CM | POA: Diagnosis not present

## 2016-06-24 IMAGING — CT CT ABD-PELV W/ CM
2 of 5 series · 16 of 46 positions shown, 18 images · IV contrast (100 ML OMNI 300)
Comparison: 08/16/2009

CLINICAL DATA: History of pancreatitis. Current right-sided
abdominal pain and vomiting. Right lower quadrant pain.

EXAM:
CT ABDOMEN AND PELVIS WITH CONTRAST
TECHNIQUE: Multidetector CT imaging of the abdomen and pelvis was performed
using the standard protocol following bolus administration of
intravenous contrast.
CONTRAST:  100mL OMNIPAQUE IOHEXOL 300 MG/ML  SOLN

[Series 2: abd/pel with · axial · 0.67mm/px · z∈[-392,-32]mm · 13 of 80 slices shown, 15 images]
[im 4/80  soft-tissue]
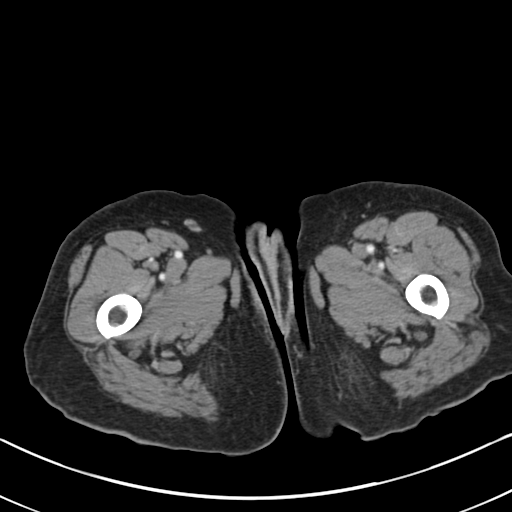
[im 4/80  bone]
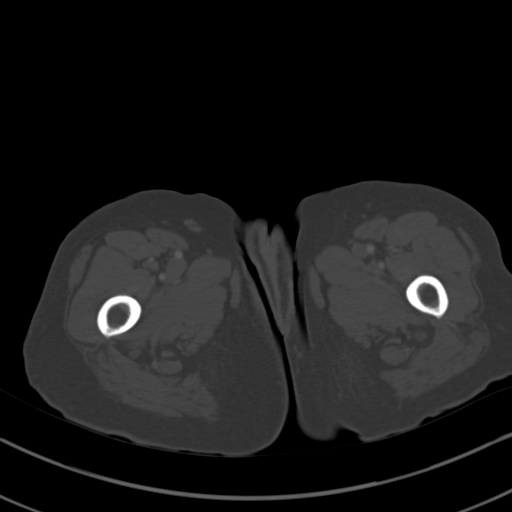
[im 12/80  soft-tissue]
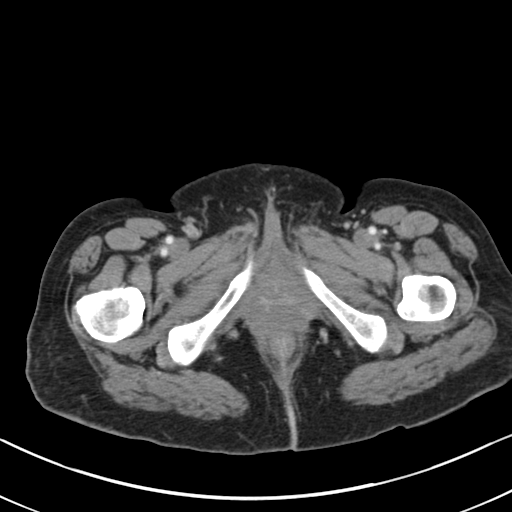
[im 16/80  soft-tissue]
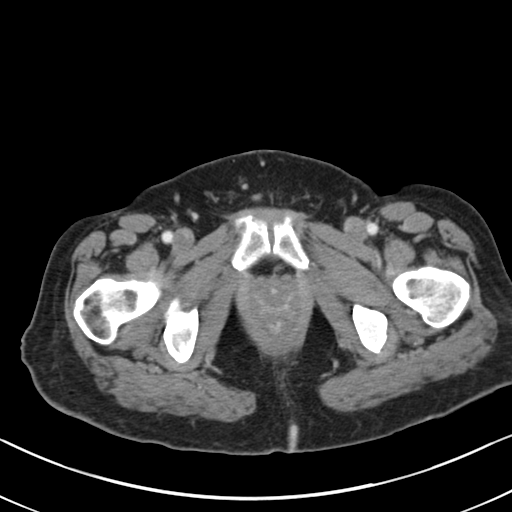
[im 24/80  soft-tissue]
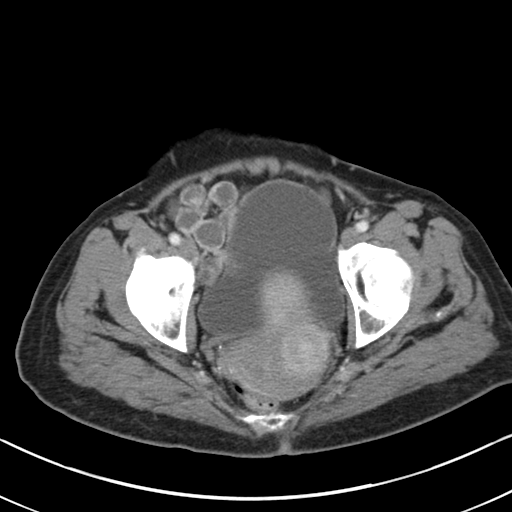
[im 28/80  soft-tissue]
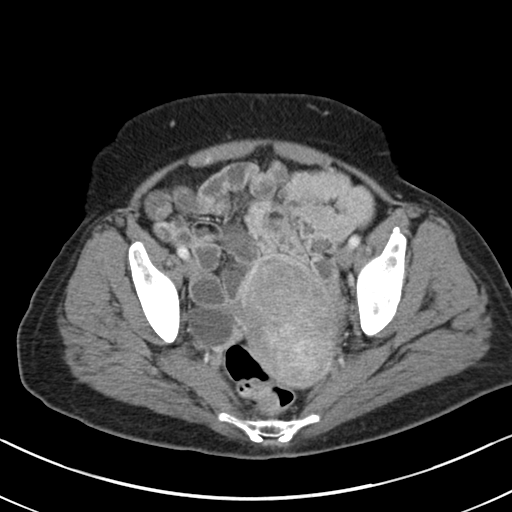
[im 36/80  soft-tissue]
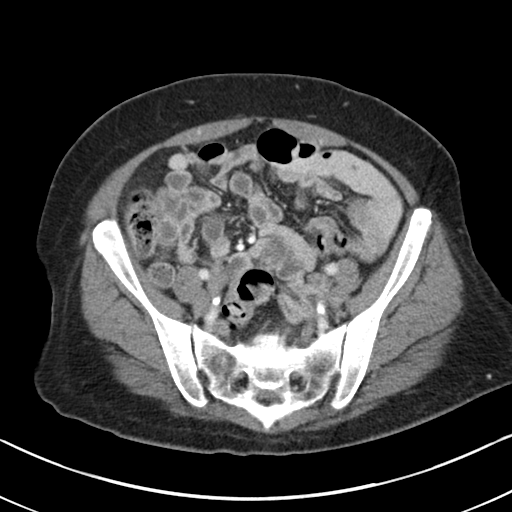
[im 40/80  soft-tissue]
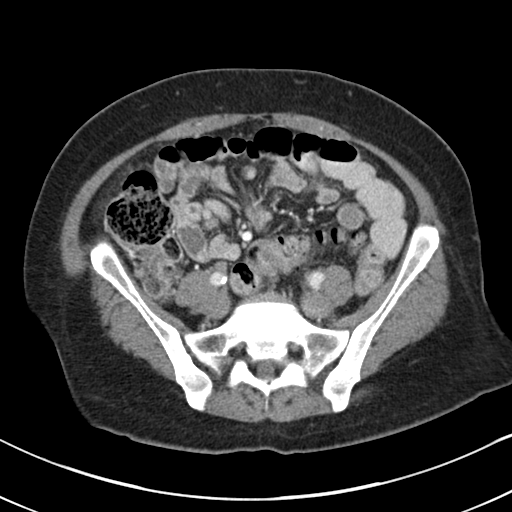
[im 44/80  soft-tissue]
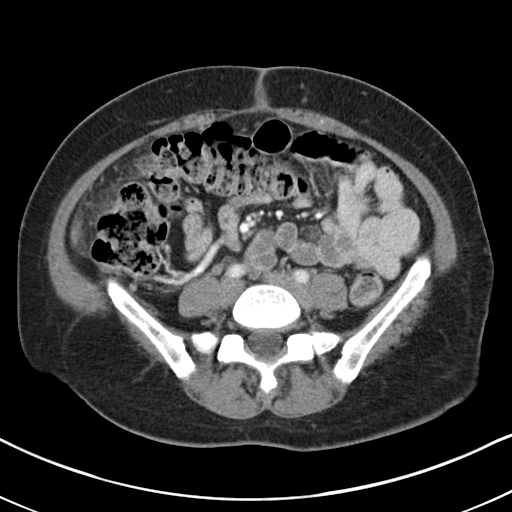
[im 52/80  soft-tissue]
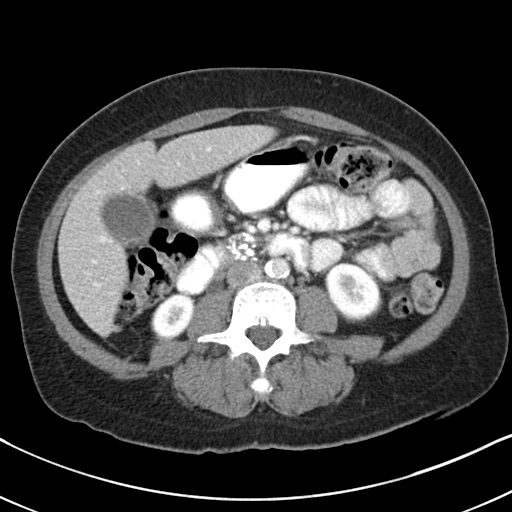
[im 52/80  bone]
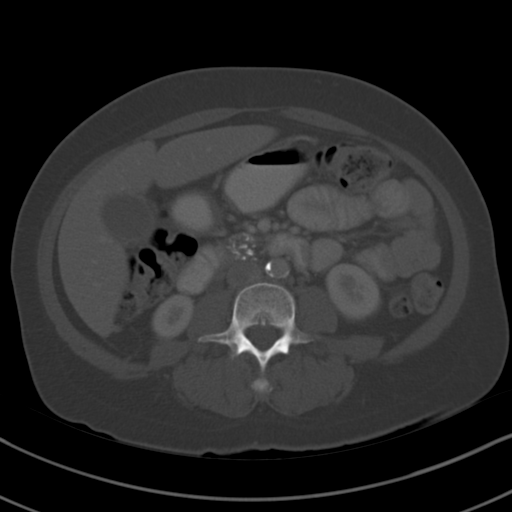
[im 56/80  soft-tissue]
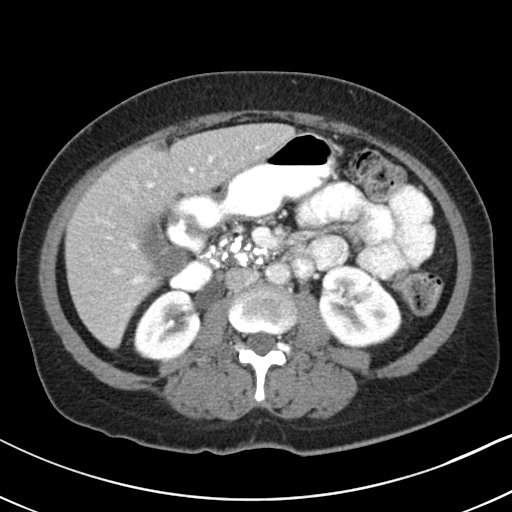
[im 64/80  soft-tissue]
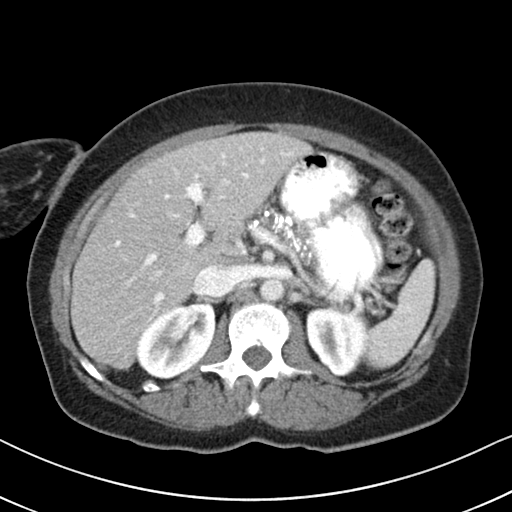
[im 68/80  soft-tissue]
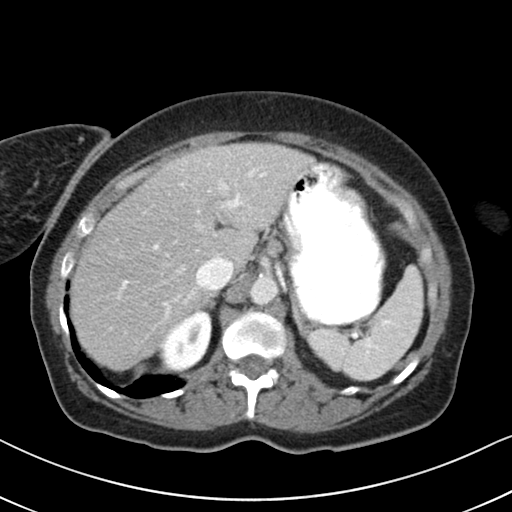
[im 76/80  soft-tissue]
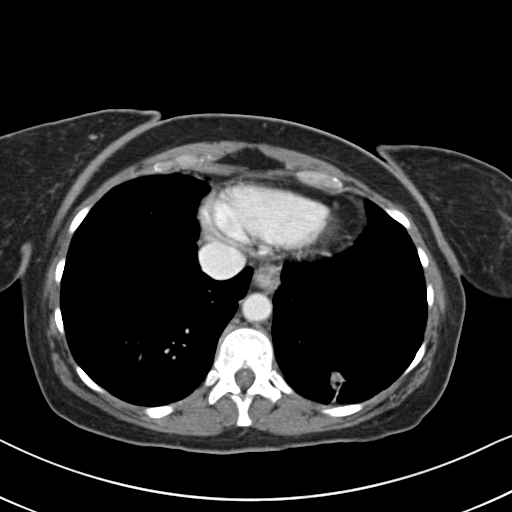

[Series 5: coronal a/|p · coronal · 0.65mm/px · 3 of 89 slices shown]
[im 30/89  soft-tissue]
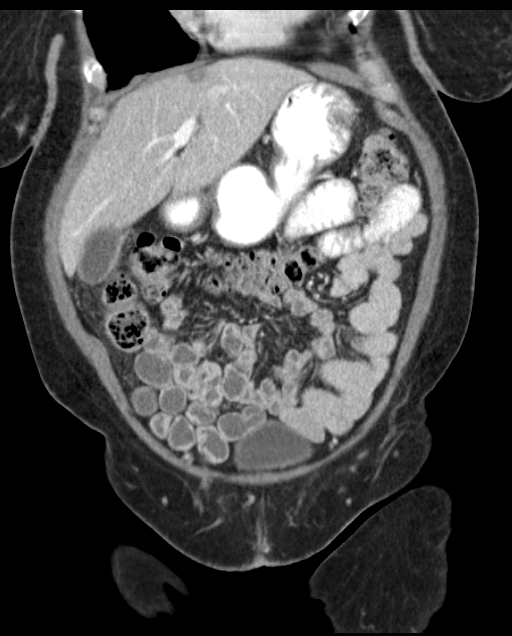
[im 40/89  soft-tissue]
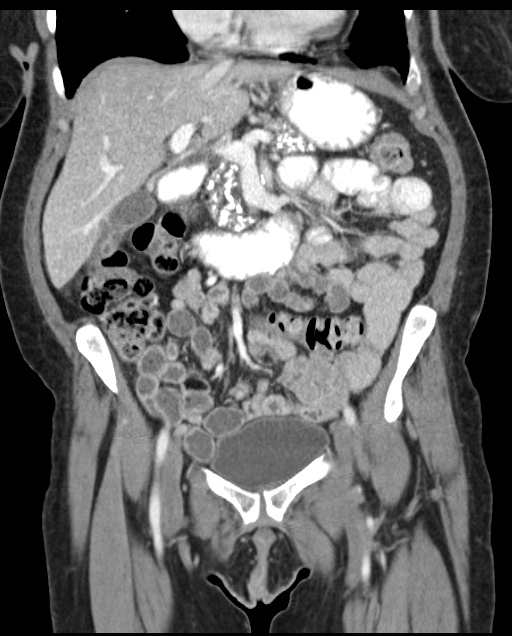
[im 49/89  soft-tissue]
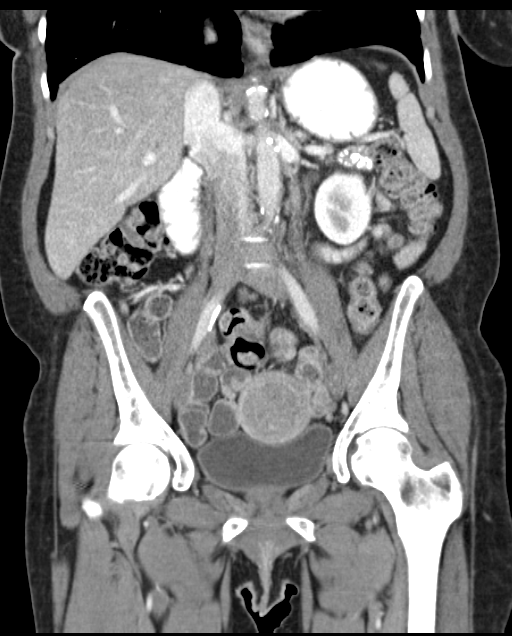

[16 of 46 positions shown; findings below may reference images not displayed]

FINDINGS: Nodular scarring in the left lung base is similar to prior study
consistent with benign etiology. Atelectasis and mild interstitial
fibrosis in the lung bases. Mild thickening in the esophageal wall
which may indicate reflux disease.

Diffuse calcification throughout the pancreas consistent with
chronic pancreatitis. This demonstrates significant progression
throughout the pancreas since previous study. No pancreatic
inflammation or ductal dilatation. No peripancreatic fluid
collections. Diffuse fatty infiltration of the liver. Gallbladder
wall is mildly thickened with some pericholecystic edema. This may
indicate cholecystitis although no gallstones are visualized. No
bile duct dilatation. No adrenal gland nodules. Kidneys, inferior
vena cava, and retroperitoneal lymph nodes are unremarkable.
Calcification of aorta without aneurysm. Stomach, small bowel, and
colon are not abnormally distended. Stool fills the colon. No free
air or free fluid in the abdomen.

Pelvis: Appendix is normal. Uterus is anteverted with multiple
nodules enlarging the uterus consistent with uterine fibroids. No
abnormal adnexal masses. Bladder wall is not thickened. No free or
loculated pelvic fluid collections. No pelvic mass or
lymphadenopathy otherwise demonstrated. No destructive bone lesions.
IMPRESSION: Diffuse calcification throughout the pancreas consistent with
chronic pancreatitis. No acute inflammation of the pancreas
identified. Mild gallbladder wall thickening and edema may indicate
cholecystitis although no radiopaque stones demonstrated. No bile
duct dilatation. Fatty infiltration of the liver. Multiple uterine
fibroids.

## 2016-07-02 ENCOUNTER — Emergency Department (HOSPITAL_COMMUNITY)
Admission: EM | Admit: 2016-07-02 | Discharge: 2016-07-02 | Disposition: A | Discharge status: Home / Self Care | Payer: 59 | Attending: Emergency Medicine | Admitting: Emergency Medicine

## 2016-07-02 ENCOUNTER — Encounter (HOSPITAL_COMMUNITY): Payer: Self-pay | Admitting: Emergency Medicine

## 2016-07-02 DIAGNOSIS — K2900 Acute gastritis without bleeding: Secondary | ICD-10-CM

## 2016-07-02 DIAGNOSIS — I110 Hypertensive heart disease with heart failure: Secondary | ICD-10-CM | POA: Insufficient documentation

## 2016-07-02 DIAGNOSIS — F17210 Nicotine dependence, cigarettes, uncomplicated: Secondary | ICD-10-CM | POA: Diagnosis not present

## 2016-07-02 DIAGNOSIS — J449 Chronic obstructive pulmonary disease, unspecified: Secondary | ICD-10-CM | POA: Insufficient documentation

## 2016-07-02 DIAGNOSIS — I509 Heart failure, unspecified: Secondary | ICD-10-CM | POA: Insufficient documentation

## 2016-07-02 DIAGNOSIS — Z955 Presence of coronary angioplasty implant and graft: Secondary | ICD-10-CM | POA: Insufficient documentation

## 2016-07-02 DIAGNOSIS — R1013 Epigastric pain: Secondary | ICD-10-CM | POA: Diagnosis not present

## 2016-07-02 DIAGNOSIS — Z79899 Other long term (current) drug therapy: Secondary | ICD-10-CM | POA: Diagnosis not present

## 2016-07-02 DIAGNOSIS — I2510 Atherosclerotic heart disease of native coronary artery without angina pectoris: Secondary | ICD-10-CM | POA: Insufficient documentation

## 2016-07-02 DIAGNOSIS — Z951 Presence of aortocoronary bypass graft: Secondary | ICD-10-CM | POA: Diagnosis not present

## 2016-07-02 DIAGNOSIS — E119 Type 2 diabetes mellitus without complications: Secondary | ICD-10-CM | POA: Diagnosis not present

## 2016-07-02 DIAGNOSIS — Z7984 Long term (current) use of oral hypoglycemic drugs: Secondary | ICD-10-CM | POA: Diagnosis not present

## 2016-07-02 DIAGNOSIS — Z7982 Long term (current) use of aspirin: Secondary | ICD-10-CM | POA: Insufficient documentation

## 2016-07-02 LAB — COMPREHENSIVE METABOLIC PANEL
ALBUMIN: 4.1 g/dL (ref 3.5–5.0)
ALT: 55 U/L — ABNORMAL HIGH (ref 14–54)
ANION GAP: 8 (ref 5–15)
AST: 75 U/L — AB (ref 15–41)
Alkaline Phosphatase: 65 U/L (ref 38–126)
BILIRUBIN TOTAL: 0.5 mg/dL (ref 0.3–1.2)
BUN: 9 mg/dL (ref 6–20)
CHLORIDE: 108 mmol/L (ref 101–111)
CO2: 25 mmol/L (ref 22–32)
Calcium: 9.7 mg/dL (ref 8.9–10.3)
Creatinine, Ser: 0.8 mg/dL (ref 0.44–1.00)
GFR calc Af Amer: >60 mL/min (ref >60)
GFR calc non Af Amer: >60 mL/min (ref >60)
GLUCOSE: 93 mg/dL (ref 65–99)
POTASSIUM: 4.1 mmol/L (ref 3.5–5.1)
SODIUM: 141 mmol/L (ref 135–145)
TOTAL PROTEIN: 7.6 g/dL (ref 6.5–8.1)

## 2016-07-02 LAB — LIPASE, BLOOD: LIPASE: 10 U/L — AB (ref 11–51)

## 2016-07-02 LAB — URINALYSIS, ROUTINE W REFLEX MICROSCOPIC
BACTERIA UA: NONE SEEN
BILIRUBIN URINE: NEGATIVE
Glucose, UA: NEGATIVE mg/dL
HGB URINE DIPSTICK: NEGATIVE
KETONES UR: 5 mg/dL — AB
Leukocytes, UA: NEGATIVE
NITRITE: NEGATIVE
Protein, ur: 30 mg/dL — AB
SPECIFIC GRAVITY, URINE: 1.027 (ref 1.005–1.030)
pH: 5 (ref 5.0–8.0)

## 2016-07-02 LAB — CBC
HEMATOCRIT: 47.1 % — AB (ref 36.0–46.0)
HEMOGLOBIN: 15.9 g/dL — AB (ref 12.0–15.0)
MCH: 32.1 pg (ref 26.0–34.0)
MCHC: 33.8 g/dL (ref 30.0–36.0)
MCV: 95.2 fL (ref 78.0–100.0)
Platelets: 247 10*3/uL (ref 150–400)
RBC: 4.95 MIL/uL (ref 3.87–5.11)
RDW: 12.1 % (ref 11.5–15.5)
WBC: 5.9 10*3/uL (ref 4.0–10.5)

## 2016-07-02 MED ORDER — HYDROMORPHONE HCL 1 MG/ML IJ SOLN
0.5000 mg | Freq: Once | INTRAMUSCULAR | Status: AC
  Administered 2016-07-02: 0.5 mg via INTRAVENOUS
  Filled 2016-07-02: qty 1

## 2016-07-02 MED ORDER — GI COCKTAIL ~~LOC~~
30.0000 mL | Freq: Once | ORAL | Status: AC
  Administered 2016-07-02: 30 mL via ORAL
  Filled 2016-07-02: qty 30

## 2016-07-02 MED ORDER — FAMOTIDINE IN NACL 20-0.9 MG/50ML-% IV SOLN
20.0000 mg | Freq: Once | INTRAVENOUS | Status: AC
  Administered 2016-07-02: 20 mg via INTRAVENOUS
  Filled 2016-07-02: qty 50

## 2016-07-02 MED ORDER — SODIUM CHLORIDE 0.9 % IV BOLUS (SEPSIS)
1000.0000 mL | Freq: Once | INTRAVENOUS | Status: AC
  Administered 2016-07-02: 1000 mL via INTRAVENOUS

## 2016-07-02 MED ORDER — HYDROMORPHONE HCL 1 MG/ML IJ SOLN
1.0000 mg | Freq: Once | INTRAMUSCULAR | Status: AC
  Administered 2016-07-02: 1 mg via INTRAVENOUS
  Filled 2016-07-02: qty 1

## 2016-07-02 MED ORDER — ONDANSETRON HCL 4 MG/2ML IJ SOLN
4.0000 mg | Freq: Once | INTRAMUSCULAR | Status: AC
Start: 2016-07-02 — End: 2016-07-02
  Administered 2016-07-02: 4 mg via INTRAVENOUS
  Filled 2016-07-02: qty 2

## 2016-07-02 MED ORDER — PANTOPRAZOLE SODIUM 40 MG PO TBEC: 40.0000 mg | DELAYED_RELEASE_TABLET | Freq: Every day | ORAL | 0 refills | Status: DC

## 2016-07-02 MED ORDER — ONDANSETRON HCL 4 MG/2ML IJ SOLN
4.0000 mg | Freq: Once | INTRAMUSCULAR | Status: AC
  Administered 2016-07-02: 4 mg via INTRAVENOUS
  Filled 2016-07-02: qty 2

## 2016-07-02 NOTE — ED Triage Notes (Signed)
Pt states she woke up in the middle of the night with abd pain, n/v, and some diarrhea.

## 2016-07-02 NOTE — Discharge Instructions (Signed)
It was our pleasure to provide your ER care today - we hope that you feel better.  Take protonix (acid blocker medication).  You may also try maalox or mylanta as need.  Avoid alcohol use.  Follow up with primary care doctor in the coming week.  Return to ER if worse, new symptoms, persistent vomiting, fevers, intractable pain, other concern.   You were given pain medication in the ER - no driving for the next 6 hours.

## 2016-07-02 NOTE — ED Notes (Signed)
Pt did not need anything at this time  

## 2016-07-02 NOTE — ED Notes (Signed)
Pt aware of need for urine  

## 2016-07-02 NOTE — ED Provider Notes (Signed)
[facility] DEPT Provider Note   CSN: [id number] Arrival date & time: 07/02/16  0848     History   Chief Complaint Chief Complaint  Patient presents with  . Abdominal Pain    HPI Marie [name] is a 51 y.o. female.  Patient c/o epigastric pain, nausea and vomiting in the past day. Pain persistent, moderate, at times radiates to back. A few episodes of emesis, not bloody or bilious.  No specific exacerbating or alleviating factors. Hx pancreatitis. States drinks etoh a few times per week. Had a couple loose bms. No abd distension. No fever or chills. No chest pain or sob.    The history is provided by the patient.  Abdominal Pain   Associated symptoms include vomiting. Pertinent negatives include fever and headaches.    Past Medical History:  Diagnosis Date  . CHF (congestive heart failure) ([place])   . Chronic pancreatitis ([place])   . Coronary atherosclerosis of unspecified type of vessel, native or graft   . CBJSEGBT(517.6)    "weekly" (07/29/2013)  . Hypertension   . Leiomyoma of uterus, unspecified   . Loss of weight   . Obesity, unspecified   . OSA (obstructive sleep apnea)    no CPAP  . Pure hypercholesterolemia   . Sarcoidosis ([place])   . Shortness of breath    none since stent 2 yrs ago  . Type II diabetes mellitus [facility])     Patient Active Problem List   Diagnosis Date Noted  . COPD (chronic obstructive pulmonary disease) ([place]) 08/13/2013  . Hypoxemia 08/13/2013  . Syncope 07/29/2013  . Dehydration 07/29/2013  . Alcohol abuse, continuous 08/11/2012  . Abdominal pain 08/11/2012  . Abnormal uterine bleeding 08/04/2012  . Fibroid uterus 08/04/2012  . Hypokalemia 03/24/2011  . Acute pancreatitis 03/22/2011  . BRONCHITIS, OBSTRUCTIVE CHRONIC 04/14/2010  . 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 WEIGHT LOSS 03/24/2010  . FIBROIDS, UTERUS 08/24/2009  . ACID REFLUX DISEASE 08/03/2009  . HYPERCHOLESTEROLEMIA 02/22/2009  . OBESITY 12/10/2008  . TOBACCO ABUSE  12/10/2008  . PANCREATITIS, CHRONIC 12/10/2008  . HEART DISEASE 12/02/2008  . SLEEP APNEA 12/02/2008  . DIABETES MELLITUS, TYPE II 03/27/2003  . CAD 09/25/1998    Past Surgical History:  Procedure Laterality Date  . CORONARY ANGIOPLASTY WITH STENT PLACEMENT  09/2009   "1"  . CORONARY ARTERY BYPASS GRAFT  ~ 2000   CABG X3  . HYSTEROSCOPY WITH NOVASURE N/A 09/04/2012   Procedure: HYSTEROSCOPY WITH NOVASURE;  Surgeon: Mora Bellman, MD;  Location: [facility];  Service: Gynecology;  Laterality: N/A;  with removal intrauterine device  . TUBAL LIGATION  1997    OB History    Gravida Para Term Preterm AB Living   3 3 3     3    SAB TAB Ectopic Multiple Live Births                   Home Medications    Prior to Admission medications   Medication Sig Start Date End Date Taking? Authorizing Provider  albuterol (PROVENTIL HFA;VENTOLIN HFA) 108 (90 BASE) MCG/ACT inhaler Inhale 2 puffs into the lungs every 6 (six) hours as needed for wheezing or shortness of breath. 08/13/13   Collene Gobble, MD  aspirin 325 MG tablet Take 325 mg by mouth daily.     Historical Provider, MD  clopidogrel (PLAVIX) 75 MG tablet Take 75 mg by mouth daily with breakfast.     Historical Provider, MD  cyclobenzaprine (FLEXERIL) 5 MG tablet 1 pill  by mouth up to every 8 hours as needed. Start with one pill by mouth each bedtime as needed due to sedation 06/19/15   Wendie Agreste, MD  dicyclomine (BENTYL) 20 MG tablet Take 1 tablet (20 mg total) by mouth 2 (two) times daily. 02/18/15   Olivia Canter Sam, PA-C  hydrochlorothiazide (HYDRODIURIL) 25 MG tablet Take 12.5 mg by mouth every morning.  08/15/12   Oswald Hillock, MD  HYDROcodone-acetaminophen (NORCO) 7.5-325 MG tablet Take 1 tablet by mouth every 6 (six) hours as needed for moderate pain. 10/12/15   Lanae Crumbly, PA-C  ibuprofen (ADVIL,MOTRIN) 200 MG tablet Take 400-600 mg by mouth every 6 (six) hours as needed for moderate pain.    Historical Provider, MD  metFORMIN  (GLUCOPHAGE) 500 MG tablet Take 500 mg by mouth daily with breakfast. Take with food    Historical Provider, MD  metoprolol (LOPRESSOR) 100 MG tablet  05/30/15   Historical Provider, MD  ondansetron (ZOFRAN ODT) 4 MG disintegrating tablet Take 1 tablet (4 mg total) by mouth every 8 (eight) hours as needed. 10/12/15   Lanae Crumbly, PA-C  pravastatin (PRAVACHOL) 20 MG tablet Take 20 mg by mouth daily.     Historical Provider, MD    Family History Family History  Problem Relation Age of Onset  . Diabetes Mother   . Coronary artery disease Mother   . Heart disease Mother   . Hyperlipidemia Mother     Social History Social History  Substance Use Topics  . Smoking status: Current Every Day Smoker    Packs/day: 0.50    Years: 28.00    Types: Cigarettes  . Smokeless tobacco: Never Used  . Alcohol use 16.8 oz/week    28 Glasses of wine per week     Comment: 07/29/2013 "3-4 glasses of wine qd", denies 01/27/2015     Allergies   Lisinopril   Review of Systems Review of Systems  Constitutional: Negative for chills and fever.  HENT: Negative for sore throat.   Eyes: Negative for redness.  Respiratory: Negative for shortness of breath.   Cardiovascular: Negative for chest pain.  Gastrointestinal: Positive for abdominal pain and vomiting. Negative for blood in stool.  Genitourinary: Negative for flank pain.  Musculoskeletal: Negative for neck pain.  Skin: Negative for rash.  Neurological: Negative for headaches.  Hematological: Does not bruise/bleed easily.  Psychiatric/Behavioral: Negative for confusion.     Physical Exam Updated Vital Signs BP (!) 175/101 (BP Location: Left Arm)   Pulse 84   Temp 97.7 F (36.5 C) (Oral)   Resp 16   SpO2 97%   Physical Exam  Constitutional: She appears well-developed and well-nourished. No distress.  HENT:  Mouth/Throat: Oropharynx is clear and moist.  Eyes: Conjunctivae are normal. No scleral icterus.  Neck: Neck supple. No tracheal  deviation present.  Cardiovascular: Normal rate, regular rhythm, normal heart sounds and intact distal pulses.  Exam reveals no gallop and no friction rub.   No murmur heard. Pulmonary/Chest: Effort normal and breath sounds normal. No respiratory distress.  Abdominal: Soft. Normal appearance and bowel sounds are normal. She exhibits no distension and no mass. There is tenderness. There is no rebound and no guarding. No hernia.  Epigastric tenderness.   Genitourinary:  Genitourinary Comments: No cva tenderness  Musculoskeletal: She exhibits no edema.  Neurological: She is alert.  Skin: Skin is warm and dry. No rash noted. She is not diaphoretic.  Psychiatric: She has a normal mood and affect.  Nursing note and vitals reviewed.    ED Treatments / Results  Labs (all labs ordered are listed, but only abnormal results are displayed) Results for orders placed or performed during the hospital encounter of 07/02/16  Lipase, blood  Result Value Ref Range   Lipase 10 (L) 11 - 51 U/L  Comprehensive metabolic panel  Result Value Ref Range   Sodium 141 135 - 145 mmol/L   Potassium 4.1 3.5 - 5.1 mmol/L   Chloride 108 101 - 111 mmol/L   CO2 25 22 - 32 mmol/L   Glucose, Bld 93 65 - 99 mg/dL   BUN 9 6 - 20 mg/dL   Creatinine, Ser 0.80 0.44 - 1.00 mg/dL   Calcium 9.7 8.9 - 10.3 mg/dL   Total Protein 7.6 6.5 - 8.1 g/dL   Albumin 4.1 3.5 - 5.0 g/dL   AST 75 (H) 15 - 41 U/L   ALT 55 (H) 14 - 54 U/L   Alkaline Phosphatase 65 38 - 126 U/L   Total Bilirubin 0.5 0.3 - 1.2 mg/dL   GFR calc non Af Amer >60 >60 mL/min   GFR calc Af Amer >60 >60 mL/min   Anion gap 8 5 - 15  CBC  Result Value Ref Range   WBC 5.9 4.0 - 10.5 K/uL   RBC 4.95 3.87 - 5.11 MIL/uL   Hemoglobin 15.9 (H) 12.0 - 15.0 g/dL   HCT 47.1 (H) 36.0 - 46.0 %   MCV 95.2 78.0 - 100.0 fL   MCH 32.1 26.0 - 34.0 pg   MCHC 33.8 30.0 - 36.0 g/dL   RDW 12.1 11.5 - 15.5 %   Platelets 247 150 - 400 K/uL   EKG  EKG  Interpretation None       Radiology No results found.  Procedures Procedures (including critical care time)  Medications Ordered in ED Medications  sodium chloride 0.9 % bolus 1,000 mL (not administered)  HYDROmorphone (DILAUDID) injection 1 mg (not administered)  famotidine (PEPCID) IVPB 20 mg premix (not administered)  ondansetron (ZOFRAN) injection 4 mg (not administered)     Initial Impression / Assessment and Plan / ED Course  I have reviewed the triage vital signs and the nursing notes.  Pertinent labs & imaging results that were available during my care of the patient were reviewed by me and considered in my medical decision making (see chart for details).  Iv ns bolus. pepcid iv. zofran iv. Dilaudid 1 mg iv.  Labs sent.  Additional ns bolus.  Reviewed nursing notes and prior charts for additional history.   Trial of po fluids. Pt tolerates.  abd soft nt.  Pt appears stable for d/c.     Final Clinical Impressions(s) / ED Diagnoses   Final diagnoses:  None    New Prescriptions New Prescriptions   No medications on file     Lajean Saver, MD 07/02/16 1024

## 2016-07-11 MED FILL — HYDROCODON-APAP 5-325: 5-325 | 8 days supply | Qty: 30 | Fill #0

## 2016-07-14 DIAGNOSIS — J449 Chronic obstructive pulmonary disease, unspecified: Secondary | ICD-10-CM | POA: Diagnosis not present

## 2016-08-06 MED FILL — HYDROCODON-APAP 5-325: 5-325 | 8 days supply | Qty: 30 | Fill #0

## 2016-08-13 DIAGNOSIS — J449 Chronic obstructive pulmonary disease, unspecified: Secondary | ICD-10-CM | POA: Diagnosis not present

## 2016-08-28 ENCOUNTER — Emergency Department (HOSPITAL_COMMUNITY): Payer: 59

## 2016-08-28 ENCOUNTER — Encounter (HOSPITAL_COMMUNITY): Payer: Self-pay | Admitting: Emergency Medicine

## 2016-08-28 ENCOUNTER — Inpatient Hospital Stay (HOSPITAL_COMMUNITY)
Admission: EM | Admit: 2016-08-28 | Discharge: 2016-09-01 | DRG: 389 | Disposition: A | Discharge status: Home / Self Care | Payer: 59 | Attending: Family Medicine | Admitting: Family Medicine

## 2016-08-28 DIAGNOSIS — Y900 Blood alcohol level of less than 20 mg/100 ml: Secondary | ICD-10-CM | POA: Diagnosis present

## 2016-08-28 DIAGNOSIS — R079 Chest pain, unspecified: Secondary | ICD-10-CM | POA: Diagnosis not present

## 2016-08-28 DIAGNOSIS — K56609 Unspecified intestinal obstruction, unspecified as to partial versus complete obstruction: Secondary | ICD-10-CM | POA: Diagnosis present

## 2016-08-28 DIAGNOSIS — E1121 Type 2 diabetes mellitus with diabetic nephropathy: Secondary | ICD-10-CM | POA: Diagnosis not present

## 2016-08-28 DIAGNOSIS — E440 Moderate protein-calorie malnutrition: Secondary | ICD-10-CM | POA: Insufficient documentation

## 2016-08-28 DIAGNOSIS — D869 Sarcoidosis, unspecified: Secondary | ICD-10-CM | POA: Diagnosis present

## 2016-08-28 DIAGNOSIS — K56600 Partial intestinal obstruction, unspecified as to cause: Secondary | ICD-10-CM | POA: Diagnosis not present

## 2016-08-28 DIAGNOSIS — I110 Hypertensive heart disease with heart failure: Secondary | ICD-10-CM | POA: Diagnosis not present

## 2016-08-28 DIAGNOSIS — Z79899 Other long term (current) drug therapy: Secondary | ICD-10-CM

## 2016-08-28 DIAGNOSIS — E11649 Type 2 diabetes mellitus with hypoglycemia without coma: Secondary | ICD-10-CM | POA: Diagnosis present

## 2016-08-28 DIAGNOSIS — F10239 Alcohol dependence with withdrawal, unspecified: Secondary | ICD-10-CM | POA: Diagnosis present

## 2016-08-28 DIAGNOSIS — R1010 Upper abdominal pain, unspecified: Secondary | ICD-10-CM

## 2016-08-28 DIAGNOSIS — Z955 Presence of coronary angioplasty implant and graft: Secondary | ICD-10-CM

## 2016-08-28 DIAGNOSIS — R0789 Other chest pain: Secondary | ICD-10-CM | POA: Diagnosis not present

## 2016-08-28 DIAGNOSIS — Z9119 Patient's noncompliance with other medical treatment and regimen: Secondary | ICD-10-CM

## 2016-08-28 DIAGNOSIS — G8929 Other chronic pain: Secondary | ICD-10-CM

## 2016-08-28 DIAGNOSIS — E1140 Type 2 diabetes mellitus with diabetic neuropathy, unspecified: Secondary | ICD-10-CM | POA: Diagnosis present

## 2016-08-28 DIAGNOSIS — E118 Type 2 diabetes mellitus with unspecified complications: Secondary | ICD-10-CM

## 2016-08-28 DIAGNOSIS — Z951 Presence of aortocoronary bypass graft: Secondary | ICD-10-CM | POA: Diagnosis not present

## 2016-08-28 DIAGNOSIS — I509 Heart failure, unspecified: Secondary | ICD-10-CM | POA: Diagnosis not present

## 2016-08-28 DIAGNOSIS — R109 Unspecified abdominal pain: Secondary | ICD-10-CM | POA: Diagnosis not present

## 2016-08-28 DIAGNOSIS — Z7984 Long term (current) use of oral hypoglycemic drugs: Secondary | ICD-10-CM

## 2016-08-28 DIAGNOSIS — E785 Hyperlipidemia, unspecified: Secondary | ICD-10-CM | POA: Diagnosis present

## 2016-08-28 DIAGNOSIS — D259 Leiomyoma of uterus, unspecified: Secondary | ICD-10-CM | POA: Diagnosis present

## 2016-08-28 DIAGNOSIS — Z7902 Long term (current) use of antithrombotics/antiplatelets: Secondary | ICD-10-CM

## 2016-08-28 DIAGNOSIS — K219 Gastro-esophageal reflux disease without esophagitis: Secondary | ICD-10-CM | POA: Diagnosis present

## 2016-08-28 DIAGNOSIS — K860 Alcohol-induced chronic pancreatitis: Secondary | ICD-10-CM | POA: Diagnosis not present

## 2016-08-28 DIAGNOSIS — R002 Palpitations: Secondary | ICD-10-CM | POA: Diagnosis not present

## 2016-08-28 DIAGNOSIS — Z6821 Body mass index (BMI) 21.0-21.9, adult: Secondary | ICD-10-CM

## 2016-08-28 DIAGNOSIS — G4733 Obstructive sleep apnea (adult) (pediatric): Secondary | ICD-10-CM | POA: Diagnosis present

## 2016-08-28 DIAGNOSIS — J4489 Other specified chronic obstructive pulmonary disease: Secondary | ICD-10-CM

## 2016-08-28 DIAGNOSIS — Z7982 Long term (current) use of aspirin: Secondary | ICD-10-CM

## 2016-08-28 DIAGNOSIS — F1010 Alcohol abuse, uncomplicated: Secondary | ICD-10-CM

## 2016-08-28 DIAGNOSIS — R1084 Generalized abdominal pain: Secondary | ICD-10-CM | POA: Diagnosis not present

## 2016-08-28 DIAGNOSIS — J449 Chronic obstructive pulmonary disease, unspecified: Secondary | ICD-10-CM | POA: Diagnosis present

## 2016-08-28 DIAGNOSIS — K5669 Other partial intestinal obstruction: Secondary | ICD-10-CM | POA: Diagnosis not present

## 2016-08-28 DIAGNOSIS — I10 Essential (primary) hypertension: Secondary | ICD-10-CM | POA: Diagnosis not present

## 2016-08-28 DIAGNOSIS — Z888 Allergy status to other drugs, medicaments and biological substances status: Secondary | ICD-10-CM

## 2016-08-28 DIAGNOSIS — F17210 Nicotine dependence, cigarettes, uncomplicated: Secondary | ICD-10-CM | POA: Diagnosis present

## 2016-08-28 DIAGNOSIS — J410 Simple chronic bronchitis: Secondary | ICD-10-CM

## 2016-08-28 DIAGNOSIS — A084 Viral intestinal infection, unspecified: Secondary | ICD-10-CM | POA: Diagnosis present

## 2016-08-28 DIAGNOSIS — I2510 Atherosclerotic heart disease of native coronary artery without angina pectoris: Secondary | ICD-10-CM | POA: Diagnosis present

## 2016-08-28 LAB — BASIC METABOLIC PANEL
ANION GAP: 11 (ref 5–15)
BUN: 11 mg/dL (ref 6–20)
CHLORIDE: 105 mmol/L (ref 101–111)
CO2: 22 mmol/L (ref 22–32)
Calcium: 9.5 mg/dL (ref 8.9–10.3)
Creatinine, Ser: 0.78 mg/dL (ref 0.44–1.00)
GFR calc non Af Amer: >60 mL/min (ref >60)
GLUCOSE: 94 mg/dL (ref 65–99)
Potassium: 3.7 mmol/L (ref 3.5–5.1)
Sodium: 138 mmol/L (ref 135–145)

## 2016-08-28 LAB — URINALYSIS, ROUTINE W REFLEX MICROSCOPIC
Bilirubin Urine: NEGATIVE
Glucose, UA: NEGATIVE mg/dL
Hgb urine dipstick: NEGATIVE
Ketones, ur: NEGATIVE mg/dL
Leukocytes, UA: NEGATIVE
Nitrite: NEGATIVE
Protein, ur: NEGATIVE mg/dL
Specific Gravity, Urine: 1.041 — ABNORMAL HIGH (ref 1.005–1.030)
pH: 5 (ref 5.0–8.0)

## 2016-08-28 LAB — CBC
HCT: 46.8 % — ABNORMAL HIGH (ref 36.0–46.0)
HEMATOCRIT: 44.9 % (ref 36.0–46.0)
HEMOGLOBIN: 15.8 g/dL — AB (ref 12.0–15.0)
Hemoglobin: 15 g/dL (ref 12.0–15.0)
MCH: 31.7 pg (ref 26.0–34.0)
MCH: 31.4 pg (ref 26.0–34.0)
MCHC: 33.8 g/dL (ref 30.0–36.0)
MCHC: 33.4 g/dL (ref 30.0–36.0)
MCV: 93.8 fL (ref 78.0–100.0)
MCV: 94.1 fL (ref 78.0–100.0)
Platelets: 227 10*3/uL (ref 150–400)
Platelets: 207 10*3/uL (ref 150–400)
RBC: 4.99 MIL/uL (ref 3.87–5.11)
RBC: 4.77 MIL/uL (ref 3.87–5.11)
RDW: 12.1 % (ref 11.5–15.5)
RDW: 12.1 % (ref 11.5–15.5)
WBC: 8.8 10*3/uL (ref 4.0–10.5)
WBC: 6.9 10*3/uL (ref 4.0–10.5)

## 2016-08-28 LAB — GLUCOSE, CAPILLARY
GLUCOSE-CAPILLARY: 74 mg/dL (ref 65–99)
Glucose-Capillary: 77 mg/dL (ref 65–99)

## 2016-08-28 LAB — CREATININE, SERUM
Creatinine, Ser: 0.8 mg/dL (ref 0.44–1.00)
GFR calc Af Amer: >60 mL/min (ref >60)

## 2016-08-28 LAB — I-STAT TROPONIN, ED: Troponin i, poc: 0 ng/mL (ref 0.00–0.08)

## 2016-08-28 LAB — HEPATIC FUNCTION PANEL
ALT: 42 U/L (ref 14–54)
AST: 64 U/L — ABNORMAL HIGH (ref 15–41)
Albumin: 3.8 g/dL (ref 3.5–5.0)
Alkaline Phosphatase: 60 U/L (ref 38–126)
Bilirubin, Direct: 0.3 mg/dL (ref 0.1–0.5)
Indirect Bilirubin: 0.6 mg/dL (ref 0.3–0.9)
Total Bilirubin: 0.9 mg/dL (ref 0.3–1.2)
Total Protein: 7.2 g/dL (ref 6.5–8.1)

## 2016-08-28 LAB — ETHANOL: Alcohol, Ethyl (B): 18 mg/dL — ABNORMAL HIGH (ref <5)

## 2016-08-28 LAB — LIPASE, BLOOD: Lipase: 21 U/L (ref 11–51)

## 2016-08-28 MED ORDER — FOLIC ACID 1 MG PO TABS
1.0000 mg | ORAL_TABLET | Freq: Every day | ORAL | Status: DC
  Administered 2016-08-28 – 2016-09-01 (×5): 1 mg via ORAL
  Filled 2016-08-28 (×5): qty 1

## 2016-08-28 MED ORDER — ADULT MULTIVITAMIN W/MINERALS CH
1.0000 | ORAL_TABLET | Freq: Every day | ORAL | Status: DC
  Administered 2016-08-28 – 2016-09-01 (×5): 1 via ORAL
  Filled 2016-08-28 (×5): qty 1

## 2016-08-28 MED ORDER — ONDANSETRON HCL 4 MG/2ML IJ SOLN
4.0000 mg | Freq: Once | INTRAMUSCULAR | Status: AC
  Administered 2016-08-28: 4 mg via INTRAVENOUS
  Filled 2016-08-28: qty 2

## 2016-08-28 MED ORDER — HYDROCHLOROTHIAZIDE 25 MG PO TABS
12.5000 mg | ORAL_TABLET | Freq: Every morning | ORAL | Status: DC
  Administered 2016-08-30 – 2016-09-01 (×3): 12.5 mg via ORAL
  Filled 2016-08-28 (×5): qty 1

## 2016-08-28 MED ORDER — METOPROLOL TARTRATE 25 MG PO TABS
100.0000 mg | ORAL_TABLET | Freq: Every day | ORAL | Status: DC
  Administered 2016-08-28 – 2016-09-01 (×5): 100 mg via ORAL
  Filled 2016-08-28 (×5): qty 4

## 2016-08-28 MED ORDER — ONDANSETRON HCL 4 MG/2ML IJ SOLN
4.0000 mg | Freq: Four times a day (QID) | INTRAMUSCULAR | Status: DC | PRN
  Administered 2016-08-28 – 2016-08-31 (×8): 4 mg via INTRAVENOUS
  Filled 2016-08-28 (×8): qty 2

## 2016-08-28 MED ORDER — ACETAMINOPHEN 650 MG RE SUPP: 650.0000 mg | Freq: Four times a day (QID) | RECTAL | Status: DC | PRN

## 2016-08-28 MED ORDER — IOPAMIDOL (ISOVUE-300) INJECTION 61%
INTRAVENOUS | Status: AC
  Administered 2016-08-28: 100 mL
  Filled 2016-08-28: qty 100

## 2016-08-28 MED ORDER — VITAMIN B-1 100 MG PO TABS
100.0000 mg | ORAL_TABLET | Freq: Every day | ORAL | Status: DC
  Administered 2016-08-29 – 2016-09-01 (×4): 100 mg via ORAL
  Filled 2016-08-28 (×5): qty 1

## 2016-08-28 MED ORDER — FENTANYL CITRATE (PF) 100 MCG/2ML IJ SOLN
25.0000 ug | INTRAMUSCULAR | Status: DC | PRN
  Administered 2016-08-28 – 2016-08-29 (×4): 25 ug via INTRAVENOUS
  Filled 2016-08-28 (×4): qty 2

## 2016-08-28 MED ORDER — LORAZEPAM 1 MG PO TABS: 2.0000 mg | ORAL_TABLET | Freq: Four times a day (QID) | ORAL | Status: DC | PRN

## 2016-08-28 MED ORDER — ONDANSETRON HCL 4 MG PO TABS
4.0000 mg | ORAL_TABLET | Freq: Four times a day (QID) | ORAL | Status: DC | PRN
  Administered 2016-08-29: 4 mg via ORAL
  Filled 2016-08-28: qty 1

## 2016-08-28 MED ORDER — SODIUM CHLORIDE 0.45 % IV SOLN
INTRAVENOUS | Status: DC
  Administered 2016-08-28 – 2016-08-29 (×2): via INTRAVENOUS

## 2016-08-28 MED ORDER — PANTOPRAZOLE SODIUM 40 MG IV SOLR
40.0000 mg | INTRAVENOUS | Status: DC
  Administered 2016-08-28 – 2016-08-30 (×3): 40 mg via INTRAVENOUS
  Filled 2016-08-28 (×3): qty 40

## 2016-08-28 MED ORDER — INSULIN ASPART 100 UNIT/ML ~~LOC~~ SOLN
0.0000 [IU] | Freq: Three times a day (TID) | SUBCUTANEOUS | Status: DC
  Administered 2016-08-30: 3 [IU] via SUBCUTANEOUS
  Administered 2016-08-31: 2 [IU] via SUBCUTANEOUS
  Administered 2016-08-31: 1 [IU] via SUBCUTANEOUS
  Administered 2016-09-01: 2 [IU] via SUBCUTANEOUS

## 2016-08-28 MED ORDER — ACETAMINOPHEN 325 MG PO TABS: 650.0000 mg | ORAL_TABLET | Freq: Four times a day (QID) | ORAL | Status: DC | PRN

## 2016-08-28 MED ORDER — CLOPIDOGREL BISULFATE 75 MG PO TABS
75.0000 mg | ORAL_TABLET | Freq: Every day | ORAL | Status: DC
  Administered 2016-08-29 – 2016-09-01 (×4): 75 mg via ORAL
  Filled 2016-08-28 (×5): qty 1

## 2016-08-28 MED ORDER — MORPHINE SULFATE (PF) 4 MG/ML IV SOLN
4.0000 mg | Freq: Once | INTRAVENOUS | Status: AC
  Administered 2016-08-28: 4 mg via INTRAVENOUS
  Filled 2016-08-28: qty 1

## 2016-08-28 MED ORDER — LOSARTAN POTASSIUM 50 MG PO TABS
50.0000 mg | ORAL_TABLET | Freq: Every day | ORAL | Status: DC
  Administered 2016-08-28 – 2016-09-01 (×4): 50 mg via ORAL
  Filled 2016-08-28 (×5): qty 1

## 2016-08-28 MED ORDER — HEPARIN SODIUM (PORCINE) 5000 UNIT/ML IJ SOLN
5000.0000 [IU] | Freq: Three times a day (TID) | INTRAMUSCULAR | Status: DC
  Administered 2016-08-28 – 2016-09-01 (×11): 5000 [IU] via SUBCUTANEOUS
  Filled 2016-08-28 (×11): qty 1

## 2016-08-28 MED ORDER — LORAZEPAM 2 MG/ML IJ SOLN
2.0000 mg | Freq: Four times a day (QID) | INTRAMUSCULAR | Status: DC | PRN
  Administered 2016-08-29: 2 mg via INTRAVENOUS
  Filled 2016-08-28: qty 1

## 2016-08-28 MED ORDER — KETOROLAC TROMETHAMINE 15 MG/ML IJ SOLN
15.0000 mg | Freq: Four times a day (QID) | INTRAMUSCULAR | Status: DC | PRN
  Administered 2016-08-28 – 2016-08-30 (×8): 15 mg via INTRAVENOUS
  Filled 2016-08-28 (×8): qty 1

## 2016-08-28 MED ORDER — LORAZEPAM 2 MG/ML IJ SOLN
2.0000 mg | Freq: Once | INTRAMUSCULAR | Status: AC
  Administered 2016-08-28: 2 mg via INTRAVENOUS
  Filled 2016-08-28: qty 1

## 2016-08-28 MED ORDER — METHOCARBAMOL 1000 MG/10ML IJ SOLN: 500.0000 mg | Freq: Three times a day (TID) | INTRAVENOUS | Status: DC | PRN

## 2016-08-28 MED ORDER — INSULIN ASPART 100 UNIT/ML ~~LOC~~ SOLN: 0.0000 [IU] | Freq: Every day | SUBCUTANEOUS | Status: DC

## 2016-08-28 MED ORDER — THIAMINE HCL 100 MG/ML IJ SOLN
100.0000 mg | Freq: Every day | INTRAMUSCULAR | Status: DC
  Administered 2016-08-28: 100 mg via INTRAVENOUS
  Filled 2016-08-28 (×2): qty 2

## 2016-08-28 MED ORDER — PROMETHAZINE HCL 25 MG/ML IJ SOLN
12.5000 mg | Freq: Once | INTRAMUSCULAR | Status: AC
  Administered 2016-08-28: 12.5 mg via INTRAVENOUS
  Filled 2016-08-28: qty 1

## 2016-08-28 MED ORDER — ASPIRIN 325 MG PO TABS
325.0000 mg | ORAL_TABLET | Freq: Every day | ORAL | Status: DC
  Administered 2016-08-29 – 2016-09-01 (×4): 325 mg via ORAL
  Filled 2016-08-28 (×5): qty 1

## 2016-08-28 MED ORDER — ALBUTEROL SULFATE (2.5 MG/3ML) 0.083% IN NEBU: 2.5000 mg | INHALATION_SOLUTION | RESPIRATORY_TRACT | Status: DC | PRN

## 2016-08-28 MED ORDER — PRAVASTATIN SODIUM 40 MG PO TABS
20.0000 mg | ORAL_TABLET | Freq: Every day | ORAL | Status: DC
  Administered 2016-08-28 – 2016-08-31 (×4): 20 mg via ORAL
  Filled 2016-08-28 (×4): qty 1

## 2016-08-28 NOTE — Consult Note (Signed)
[facility] Surgery Consult Note  Marie [name] 02-10-1966  [id number].    Requesting MD: Cathi Roan Chief Complaint/Reason for Consult: SBO HPI:  Marie [name] is a 51yo female who presented to [facility] earlier today with acute onset epigastric pain radiating into her chest and back. States that the pain began around 820 this morning. It was severe, sharp, and constant. Worse with movement. She also reports nausea, fever, chills, and loose stools. Most recent BM was this morning and loose. States that her other episode of loose stools was on Saturday; states that it had some bright red blood, unsure if this was in the toilet or on the paper when she wiped. No other episodes of hematochezia. No emesis and she denies abdominal distension. Tried taking advil but this did not help. She does have a history of chronic pancreatitis due to alcohol abuse, but patient states that this pain is different. Last meal was yesterday.  Hospital workup: - CT scan showed mildly dilated proximal and mid small bowel loops with decompressed distal small bowel concerning for early/ low-grade partial SBO, no transition point - WBC WNL, AST 64 (baseline), lipase WNL - troponin 0 - VSS   PMH significant for Alcohol abuse, Chronic pancreatitis, HTN, HLD, OSA on CPAP, DM, HLD, CHF, Sarcoidosis, CAD s/p CABG Abdominal surgical history: tubal ligation Last colonoscopy 3 years ago at [facility] - normal per patient Blood thinner: daily aspirin Smokes 1/2 PPD Drinks 4 glasses of wine daily  ROS: Review of Systems  Constitutional: Positive for chills and fever.  HENT: Negative.   Eyes: Negative.   Respiratory: Negative.   Cardiovascular: Negative.   Gastrointestinal: Positive for abdominal pain, blood in stool, diarrhea and nausea. Negative for constipation, heartburn, melena and vomiting.  Genitourinary: Negative.   Musculoskeletal: Negative.   Skin: Negative.   Neurological: Negative.   All systems  reviewed and otherwise negative except for as above  Family History  Problem Relation Age of Onset  . Diabetes Mother   . Coronary artery disease Mother   . Heart disease Mother   . Hyperlipidemia Mother     Past Medical History:  Diagnosis Date  . CHF (congestive heart failure) ([place])   . Chronic pancreatitis ([place])   . Coronary atherosclerosis of unspecified type of vessel, native or graft   . YQMVHQIO(962.9)    "weekly" (07/29/2013)  . Hypertension   . Leiomyoma of uterus, unspecified   . Loss of weight   . Obesity, unspecified   . OSA (obstructive sleep apnea)    no CPAP  . Pure hypercholesterolemia   . Sarcoidosis   . Shortness of breath    none since stent 2 yrs ago  . Type II diabetes mellitus ([place])     Past Surgical History:  Procedure Laterality Date  . CORONARY ANGIOPLASTY WITH STENT PLACEMENT  09/2009   "1"  . CORONARY ARTERY BYPASS GRAFT  ~ 2000   CABG X3  . HYSTEROSCOPY WITH NOVASURE N/A 09/04/2012   Procedure: HYSTEROSCOPY WITH NOVASURE;  Surgeon: Mora Bellman, MD;  Location: [facility];  Service: Gynecology;  Laterality: N/A;  with removal intrauterine device  . TUBAL LIGATION  1997    Social History:  reports that she has been smoking Cigarettes.  She has a 14.00 pack-year smoking history. She has never used smokeless tobacco. She reports that she drinks about 16.8 oz of alcohol per week . She reports that she does not use drugs.  Allergies:  Allergies  Allergen Reactions  .  Lisinopril Hives     (Not in a hospital admission)  Prior to Admission medications   Medication Sig Start Date End Date Taking? Authorizing Provider  albuterol (PROVENTIL HFA;VENTOLIN HFA) 108 (90 BASE) MCG/ACT inhaler Inhale 2 puffs into the lungs every 6 (six) hours as needed for wheezing or shortness of breath. 08/13/13   Byrum, Rose Fillers, MD  aspirin 325 MG tablet Take 325 mg by mouth daily.     [provider]  clopidogrel (PLAVIX) 75 MG tablet Take 75 mg by mouth daily  with breakfast.     [provider]  cyclobenzaprine (FLEXERIL) 5 MG tablet 1 pill by mouth up to every 8 hours as needed. Start with one pill by mouth each bedtime as needed due to sedation 06/19/15   Wendie Agreste, MD  dicyclomine (BENTYL) 20 MG tablet Take 1 tablet (20 mg total) by mouth 2 (two) times daily. Patient not taking: Reported on 07/02/2016 02/18/15   Sam, Olivia Canter, PA-C  hydrochlorothiazide (HYDRODIURIL) 25 MG tablet Take 12.5 mg by mouth every morning.  08/15/12   Oswald Hillock, MD  HYDROcodone-acetaminophen (NORCO) 7.5-325 MG tablet Take 1 tablet by mouth every 6 (six) hours as needed for moderate pain. Patient not taking: Reported on 07/02/2016 10/12/15   Lanae Crumbly, PA-C  HYDROcodone-acetaminophen (NORCO/VICODIN) 5-325 MG tablet Take 1 tablet by mouth every 6 (six) hours as needed for pain. 06/12/16   [provider]  ibuprofen (ADVIL,MOTRIN) 200 MG tablet Take 400-600 mg by mouth every 6 (six) hours as needed for moderate pain.    [provider]  losartan (COZAAR) 50 MG tablet Take 50 mg by mouth daily. 06/11/16   [provider]  metFORMIN (GLUCOPHAGE) 500 MG tablet Take 500 mg by mouth daily with breakfast. Take with food    [provider]  metoprolol (LOPRESSOR) 100 MG tablet Take 100 mg by mouth daily.  05/30/15   [provider]  ondansetron (ZOFRAN ODT) 4 MG disintegrating tablet Take 1 tablet (4 mg total) by mouth every 8 (eight) hours as needed. 10/12/15   Lanae Crumbly, PA-C  pantoprazole (PROTONIX) 40 MG tablet Take 1 tablet (40 mg total) by mouth daily. 07/02/16   Lajean Saver, MD  pravastatin (PRAVACHOL) 20 MG tablet Take 20 mg by mouth daily.     [provider]    Blood pressure (!) 148/91, pulse 77, temperature 98.2 F (36.8 C), temperature source Oral, resp. rate 19, height 5' 2"  (1.575 m), weight 135 lb (61.2 kg), SpO2 97 %. Physical Exam: General: pleasant, chronically ill appearing AA female who is  laying in bed in NAD HEENT: head is normocephalic, atraumatic.  Sclera are noninjected.  Pupils equal and round.  Ears and nose without any masses or lesions.  Mouth is pink and moist. Edentate.  Heart: regular, rate, and rhythm.  No obvious murmurs, gallops, or rubs noted.  Palpable pedal pulses bilaterally Lungs: CTAB, no wheezes, rhonchi, or rales noted.  Respiratory effort nonlabored Abd: soft, ND, +BS, no masses, hernias, or organomegaly. Mild TTP epigastric region MS: all 4 extremities are symmetrical with no cyanosis, clubbing, or edema. Skin: warm and dry with no masses, lesions, or rashes Psych: A&Ox3 with an appropriate affect. Neuro: cranial nerves grossly intact, extremity CSM intact bilaterally, normal speech  Results for orders placed or performed during the hospital encounter of 08/28/16 (from the past 48 hour(s))  Basic metabolic panel     Status: None   Collection Time: 08/28/16  9:50 AM  Result Value Ref Range   Sodium 138 135 - 145 mmol/L   Potassium 3.7 3.5 - 5.1 mmol/L   Chloride 105 101 - 111 mmol/L   CO2 22 22 - 32 mmol/L   Glucose, Bld 94 65 - 99 mg/dL   BUN 11 6 - 20 mg/dL   Creatinine, Ser 0.78 0.44 - 1.00 mg/dL   Calcium 9.5 8.9 - 10.3 mg/dL   GFR calc non Af Amer >60 >60 mL/min   GFR calc Af Amer >60 >60 mL/min    Comment: (NOTE) The eGFR has been calculated using the CKD EPI equation. This calculation has not been validated in all clinical situations. eGFR's persistently <60 mL/min signify possible Chronic Kidney Disease.    Anion gap 11 5 - 15  CBC     Status: Abnormal   Collection Time: 08/28/16  9:50 AM  Result Value Ref Range   WBC 8.8 4.0 - 10.5 K/uL   RBC 4.99 3.87 - 5.11 MIL/uL   Hemoglobin 15.8 (H) 12.0 - 15.0 g/dL   HCT 46.8 (H) 36.0 - 46.0 %   MCV 93.8 78.0 - 100.0 fL   MCH 31.7 26.0 - 34.0 pg   MCHC 33.8 30.0 - 36.0 g/dL   RDW 12.1 11.5 - 15.5 %   Platelets 227 150 - 400 K/uL  I-stat troponin, ED     Status: None   Collection Time:  08/28/16  9:53 AM  Result Value Ref Range   Troponin i, poc 0.00 0.00 - 0.08 ng/mL   Comment 3            Comment: Due to the release kinetics of cTnI, a negative result within the first hours of the onset of symptoms does not rule out myocardial infarction with certainty. If myocardial infarction is still suspected, repeat the test at appropriate intervals.   Ethanol     Status: Abnormal   Collection Time: 08/28/16 10:07 AM  Result Value Ref Range   Alcohol, Ethyl (B) 18 (H) <5 mg/dL    Comment:        LOWEST DETECTABLE LIMIT FOR SERUM ALCOHOL IS 5 mg/dL FOR MEDICAL PURPOSES ONLY   Hepatic function panel     Status: Abnormal   Collection Time: 08/28/16 10:07 AM  Result Value Ref Range   Total Protein 7.2 6.5 - 8.1 g/dL    Comment: SLIGHT HEMOLYSIS   Albumin 3.8 3.5 - 5.0 g/dL   AST 64 (H) 15 - 41 U/L   ALT 42 14 - 54 U/L   Alkaline Phosphatase 60 38 - 126 U/L   Total Bilirubin 0.9 0.3 - 1.2 mg/dL   Bilirubin, Direct 0.3 0.1 - 0.5 mg/dL   Indirect Bilirubin 0.6 0.3 - 0.9 mg/dL  Lipase, blood     Status: None   Collection Time: 08/28/16 10:07 AM  Result Value Ref Range   Lipase 21 11 - 51 U/L  Urinalysis, Routine w reflex microscopic     Status: Abnormal   Collection Time: 08/28/16 12:42 PM  Result Value Ref Range   Color, Urine YELLOW YELLOW   APPearance CLEAR CLEAR   Specific Gravity, Urine 1.041 (H) 1.005 - 1.030   pH 5.0 5.0 - 8.0   Glucose, UA NEGATIVE NEGATIVE mg/dL   Hgb urine dipstick NEGATIVE NEGATIVE   Bilirubin Urine NEGATIVE NEGATIVE   Ketones, ur NEGATIVE NEGATIVE mg/dL   Protein, ur NEGATIVE NEGATIVE mg/dL   Nitrite NEGATIVE NEGATIVE   Leukocytes, UA NEGATIVE NEGATIVE  Ct Abdomen Pelvis W Contrast  Result Date: 08/28/2016 CLINICAL DATA:  Diffuse abdominal pain EXAM: CT ABDOMEN AND PELVIS WITH CONTRAST TECHNIQUE: Multidetector CT imaging of the abdomen and pelvis was performed using the standard protocol following bolus administration of intravenous  contrast. CONTRAST:  180m ISOVUE-300 IOPAMIDOL (ISOVUE-300) INJECTION 61% COMPARISON:  02/18/2015 FINDINGS: Lower chest: Linear atelectasis in the left base.  No effusions. Hepatobiliary: Suspect mild diffuse fatty infiltration of the liver. No focal abnormality. Gallbladder unremarkable. Pancreas: Diffuse pancreatic calcifications compatible with chronic calcific pancreatitis. No evidence of acute pancreatitis. Spleen: No focal abnormality.  Normal size. Adrenals/Urinary Tract: No adrenal abnormality. No focal renal abnormality. No stones or hydronephrosis. Urinary bladder is unremarkable. Stomach/Bowel: Normal appendix. Distal small bowel loops are decompressed. Proximal and mid small bowel loops are mildly dilated. Findings concerning for early partial small bowel obstruction. Exact transition not visualized. Large bowel unremarkable. Stomach unremarkable. Vascular/Lymphatic: Aortic and iliac calcifications. No aneurysm or adenopathy. Reproductive: Numerous uterine fibroids.  No adnexal masses. Other: No free fluid or free air. Musculoskeletal: No acute bony abnormality. IMPRESSION: Mildly dilated proximal and mid small bowel loops with decompressed distal small bowel. Findings concerning for early/ low-grade partial small bowel obstruction. Changes of chronic pancreatitis with calcifications throughout the pancreas. Fibroid uterus. Normal appendix. Suspect mild fatty infiltration of the liver. Electronically Signed   By: KRolm BaptiseM.D.   On: 08/28/2016 11:21   Anti-infectives    None        Assessment/Plan SBO vs Ileus - abdominal surgical history: tubal ligation - acute onset abdominal pain, nausea, fever/chills, diarrhea. No emesis. - CT scan showed mildly dilated proximal and mid small bowel loops with decompressed distal small bowel concerning for early/ low-grade partial SBO, no transition point - WBC WNL, AST 64 (baseline), lipase WNL - troponin 0 - VSS  Alcohol abuse Chronic  pancreatitis HTN HLD OSA on CPAP DM HLD CHF Sarcoidosis CAD s/p CABG  ID - none VTE - SCDs, heparin FEN - IVF, NPO  Plan - Admit to medicine for multiple medical comorbidities. Recommend NPO, bowel rest, IVF, pain control, antiemetics. Ok to hold NG tube unless patient begins to vomit. Will continue to follow.  BJerrye Beavers [facility]ower [facility] 08/28/2016, 1:47 PM Pager: [phone]: [phone]-Fri 7:00 am-4:30 pm Sat-Sun 7:00 am-11:30 am

## 2016-08-28 NOTE — ED Notes (Signed)
Dr. Jacubowitz at bedside at this time.  

## 2016-08-28 NOTE — ED Notes (Signed)
Patient transported to CT 

## 2016-08-28 NOTE — ED Notes (Signed)
Gerald Stabs, PA at bedside at this time.

## 2016-08-28 NOTE — ED Notes (Signed)
Surgeon at bedside at this time. 

## 2016-08-28 NOTE — ED Provider Notes (Deleted)
Complains of right-sided anterior chest pain onset approximately 3 AM yesterday when she was riding a car and sneezed. Pain is worse with changing positions or with deep inspiration improved with remaining still. She denies any shortness of breath she treated herself with Tylenol 10 PM yesterday with partial relief. On exam alert no distress lungs clear auscultation chest is tender anteriorly. No crepitance abdomen soft nontender   Orlie Dakin, MD 08/28/16 [phone]

## 2016-08-28 NOTE — ED Provider Notes (Signed)
Complains of epigastric pain rating to back onset 8:20 AM accompanied by nausea and one episode of diarrhea. Pain feels like pancreatitis she's had in the past. She missed a drinking a bottle of wine last night. She had brief episode of chest pain this morning which has resolved spontaneously without treatment. Denies shortness of breath. No other associated symptoms   Orlie Dakin, MD 08/28/16 [phone]

## 2016-08-28 NOTE — Progress Notes (Signed)
Pt placed on CPAP of 4cmH20 (per home setting)with 3L O2 bled in and nasal mask. Nasal mask is a little big for pt, but she says it feels okay. Advised spouse to bring pt's nasal prongs for comfort. Pt tolerating CPAP at this time. RT will continue to monitor.

## 2016-08-28 NOTE — ED Provider Notes (Signed)
[facility] DEPT Provider Note   CSN: [id number] Arrival date & time: 08/28/16  0940     History   Chief Complaint Chief Complaint  Patient presents with  . Chest Pain    HPI Marie [name] is a 51 y.o. female presenting with acute onset of his pain.   Patient states she was in the shower when she experienced sudden sharp chest pain radiating to her abdomen and back. She states the pain is constant, nothing makes it better. Additionally she became nauseous, but did not vomit. She felt dizzy and sweaty. Prior to this morning, patient states she felt normal. She denies fevers, chills, shortness of breath, urinary symptoms. Patient has a history of stent placement 10 years ago, and used to see a cardiologist regularly but states it has been a while. She took aspirin and 2 sublingual nitroglycerin as prior to arrival, and had no improvement of her pain. She has a history of high blood pressure, but it has been well controlled recently. Patient smokes a pack a day, and drinks "a lot". States she drank a bottle wine last night. She has a history of chronic pancreatitis, and states this feels like when she last had pancreatitis.  Patient reports loose stools for the past several days, stating that they seem more greasy and with the very foul odor. Last take was last night. Her only abd surgery is a hysterectomy.   HPI  Past Medical History:  Diagnosis Date  . CHF (congestive heart failure) ([place])   . Chronic pancreatitis ([place])   . Coronary atherosclerosis of unspecified type of vessel, native or graft   . YYTKPTWS(568.1)    "weekly" (07/29/2013)  . Hypertension   . Leiomyoma of uterus, unspecified   . Loss of weight   . Obesity, unspecified   . OSA (obstructive sleep apnea)    no CPAP  . Pure hypercholesterolemia   . Sarcoidosis   . Shortness of breath    none since stent 2 yrs ago  . Type II diabetes mellitus [facility])     Patient Active Problem List   Diagnosis Date Noted  .  SBO (small bowel obstruction) ([place]) 08/28/2016  . COPD (chronic obstructive pulmonary disease) ([place]) 08/13/2013  . Hypoxemia 08/13/2013  . Syncope 07/29/2013  . Dehydration 07/29/2013  . Alcohol abuse, continuous 08/11/2012  . Abdominal pain 08/11/2012  . Abnormal uterine bleeding 08/04/2012  . Fibroid uterus 08/04/2012  . Hypokalemia 03/24/2011  . Acute pancreatitis 03/22/2011  . BRONCHITIS, OBSTRUCTIVE CHRONIC 04/14/2010  . 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 WEIGHT LOSS 03/24/2010  . FIBROIDS, UTERUS 08/24/2009  . ACID REFLUX DISEASE 08/03/2009  . HYPERCHOLESTEROLEMIA 02/22/2009  . OBESITY 12/10/2008  . TOBACCO ABUSE 12/10/2008  . PANCREATITIS, CHRONIC 12/10/2008  . HEART DISEASE 12/02/2008  . SLEEP APNEA 12/02/2008  . DIABETES MELLITUS, TYPE II 03/27/2003  . CAD 09/25/1998    Past Surgical History:  Procedure Laterality Date  . CORONARY ANGIOPLASTY WITH STENT PLACEMENT  09/2009   "1"  . CORONARY ARTERY BYPASS GRAFT  ~ 2000   CABG X3  . HYSTEROSCOPY WITH NOVASURE N/A 09/04/2012   Procedure: HYSTEROSCOPY WITH NOVASURE;  Surgeon: Mora Bellman, MD;  Location: [facility];  Service: Gynecology;  Laterality: N/A;  with removal intrauterine device  . TUBAL LIGATION  1997    OB History    Gravida Para Term Preterm AB Living   3 3 3     3    SAB TAB Ectopic Multiple Live Births  Home Medications    Prior to Admission medications   Medication Sig Start Date End Date Taking? Authorizing Provider  albuterol (PROVENTIL HFA;VENTOLIN HFA) 108 (90 BASE) MCG/ACT inhaler Inhale 2 puffs into the lungs every 6 (six) hours as needed for wheezing or shortness of breath. 08/13/13   Byrum, Rose Fillers, MD  aspirin 325 MG tablet Take 325 mg by mouth daily.     [provider]  clopidogrel (PLAVIX) 75 MG tablet Take 75 mg by mouth daily with breakfast.     [provider]  cyclobenzaprine (FLEXERIL) 5 MG tablet 1 pill by mouth up to every 8 hours as needed.  Start with one pill by mouth each bedtime as needed due to sedation 06/19/15   Wendie Agreste, MD  dicyclomine (BENTYL) 20 MG tablet Take 1 tablet (20 mg total) by mouth 2 (two) times daily. Patient not taking: Reported on 07/02/2016 02/18/15   Sam, Olivia Canter, PA-C  hydrochlorothiazide (HYDRODIURIL) 25 MG tablet Take 12.5 mg by mouth every morning.  08/15/12   Oswald Hillock, MD  HYDROcodone-acetaminophen (NORCO) 7.5-325 MG tablet Take 1 tablet by mouth every 6 (six) hours as needed for moderate pain. Patient not taking: Reported on 07/02/2016 10/12/15   Lanae Crumbly, PA-C  HYDROcodone-acetaminophen (NORCO/VICODIN) 5-325 MG tablet Take 1 tablet by mouth every 6 (six) hours as needed for pain. 06/12/16   [provider]  ibuprofen (ADVIL,MOTRIN) 200 MG tablet Take 400-600 mg by mouth every 6 (six) hours as needed for moderate pain.    [provider]  losartan (COZAAR) 50 MG tablet Take 50 mg by mouth daily. 06/11/16   [provider]  metFORMIN (GLUCOPHAGE) 500 MG tablet Take 500 mg by mouth daily with breakfast. Take with food    [provider]  metoprolol (LOPRESSOR) 100 MG tablet Take 100 mg by mouth daily.  05/30/15   [provider]  ondansetron (ZOFRAN ODT) 4 MG disintegrating tablet Take 1 tablet (4 mg total) by mouth every 8 (eight) hours as needed. 10/12/15   Lanae Crumbly, PA-C  pantoprazole (PROTONIX) 40 MG tablet Take 1 tablet (40 mg total) by mouth daily. 07/02/16   Lajean Saver, MD  pravastatin (PRAVACHOL) 20 MG tablet Take 20 mg by mouth daily.     [provider]    Family History Family History  Problem Relation Age of Onset  . Diabetes Mother   . Coronary artery disease Mother   . Heart disease Mother   . Hyperlipidemia Mother     Social History Social History  Substance Use Topics  . Smoking status: Current Every Day Smoker    Packs/day: 0.50    Years: 28.00    Types: Cigarettes  . Smokeless tobacco: Never Used  .  Alcohol use 16.8 oz/week    28 Glasses of wine per week     Comment: 07/29/2013 "3-4 glasses of wine qd", denies 01/27/2015     Allergies   Lisinopril   Review of Systems Review of Systems  Constitutional: Positive for diaphoresis. Negative for chills and fever.  HENT: Negative for rhinorrhea, sore throat and tinnitus.   Eyes: Negative for photophobia and visual disturbance.  Respiratory: Positive for chest tightness. Negative for cough and shortness of breath.   Cardiovascular: Positive for chest pain and palpitations. Negative for leg swelling.  Gastrointestinal: Positive for abdominal pain. Negative for abdominal distention, constipation, diarrhea, nausea and vomiting.  Genitourinary: Negative for difficulty urinating, dysuria and frequency.  Musculoskeletal: Negative for  arthralgias, myalgias, neck pain and neck stiffness.  Skin: Negative for rash.  Neurological: Positive for dizziness. Negative for speech difficulty and headaches.  Psychiatric/Behavioral: Negative for agitation and confusion.     Physical Exam Updated Vital Signs BP (!) 148/91   Pulse 77   Temp 98.2 F (36.8 C) (Oral)   Resp 19   Ht 5\' 2"  (1.575 m)   Wt 61.2 kg (135 lb)   SpO2 97%   BMI 24.69 kg/m   Physical Exam  Constitutional: She is oriented to person, place, and time. She appears well-developed and well-nourished. No distress (pt appears anxious).  HENT:  Head: Normocephalic and atraumatic.  Eyes: Conjunctivae and EOM are normal. Pupils are equal, round, and reactive to light.  Neck: Normal range of motion. Neck supple.  Cardiovascular: Normal rate, regular rhythm, normal heart sounds and intact distal pulses.  Exam reveals no gallop.   No murmur heard. Significant TTP of chest, back, and abd.   Pulmonary/Chest: Effort normal and breath sounds normal. No respiratory distress. She has no wheezes.  Abdominal: Soft. Bowel sounds are normal. There is tenderness.  Neurological: She is oriented to  person, place, and time.  Skin: Skin is warm and dry. She is not diaphoretic.  Psychiatric: Her mood appears anxious.     ED Treatments / Results  Labs (all labs ordered are listed, but only abnormal results are displayed) Labs Reviewed  CBC - Abnormal; Notable for the following:       Result Value   Hemoglobin 15.8 (*)    HCT 46.8 (*)    All other components within normal limits  URINALYSIS, ROUTINE W REFLEX MICROSCOPIC - Abnormal; Notable for the following:    Specific Gravity, Urine 1.041 (*)    All other components within normal limits  ETHANOL - Abnormal; Notable for the following:    Alcohol, Ethyl (B) 18 (*)    All other components within normal limits  HEPATIC FUNCTION PANEL - Abnormal; Notable for the following:    AST 64 (*)    All other components within normal limits  BASIC METABOLIC PANEL  LIPASE, BLOOD  I-STAT TROPOININ, ED    EKG  EKG Interpretation  Date/Time:  Tuesday August 28 2016 09:42:32 EDT Ventricular Rate:  101 PR Interval:    QRS Duration: 81 QT Interval:  341 QTC Calculation: 442 R Axis:   52 Text Interpretation:  Sinus tachycardia Biatrial enlargement SINCE LAST TRACING HEART RATE HAS INCREASED Confirmed by Orlie Dakin [phone]) on 08/28/2016 9:53:44 AM       Radiology Ct Abdomen Pelvis W Contrast  Result Date: 08/28/2016 CLINICAL DATA:  Diffuse abdominal pain EXAM: CT ABDOMEN AND PELVIS WITH CONTRAST TECHNIQUE: Multidetector CT imaging of the abdomen and pelvis was performed using the standard protocol following bolus administration of intravenous contrast. CONTRAST:  176mL ISOVUE-300 IOPAMIDOL (ISOVUE-300) INJECTION 61% COMPARISON:  02/18/2015 FINDINGS: Lower chest: Linear atelectasis in the left base.  No effusions. Hepatobiliary: Suspect mild diffuse fatty infiltration of the liver. No focal abnormality. Gallbladder unremarkable. Pancreas: Diffuse pancreatic calcifications compatible with chronic calcific pancreatitis. No evidence of acute  pancreatitis. Spleen: No focal abnormality.  Normal size. Adrenals/Urinary Tract: No adrenal abnormality. No focal renal abnormality. No stones or hydronephrosis. Urinary bladder is unremarkable. Stomach/Bowel: Normal appendix. Distal small bowel loops are decompressed. Proximal and mid small bowel loops are mildly dilated. Findings concerning for early partial small bowel obstruction. Exact transition not visualized. Large bowel unremarkable. Stomach unremarkable. Vascular/Lymphatic: Aortic and iliac calcifications. No aneurysm or  adenopathy. Reproductive: Numerous uterine fibroids.  No adnexal masses. Other: No free fluid or free air. Musculoskeletal: No acute bony abnormality. IMPRESSION: Mildly dilated proximal and mid small bowel loops with decompressed distal small bowel. Findings concerning for early/ low-grade partial small bowel obstruction. Changes of chronic pancreatitis with calcifications throughout the pancreas. Fibroid uterus. Normal appendix. Suspect mild fatty infiltration of the liver. Electronically Signed   By: Rolm Baptise M.D.   On: 08/28/2016 11:21    Procedures Procedures (including critical care time)  Medications Ordered in ED Medications  ondansetron (ZOFRAN) injection 4 mg (4 mg Intravenous Given 08/28/16 1039)  morphine 4 MG/ML injection 4 mg (4 mg Intravenous Given 08/28/16 1039)  iopamidol (ISOVUE-300) 61 % injection (100 mLs  Contrast Given 08/28/16 1050)  morphine 4 MG/ML injection 4 mg (4 mg Intravenous Given 08/28/16 1152)  promethazine (PHENERGAN) injection 12.5 mg (12.5 mg Intravenous Given 08/28/16 1152)     Initial Impression / Assessment and Plan / ED Course  I have reviewed the triage vital signs and the nursing notes.  Pertinent labs & imaging results that were available during my care of the patient were reviewed by me and considered in my medical decision making (see chart for details).  Clinical Course as of Aug 28 1348  Tue Aug 28, 2016  1016 On initial  assessment, patient appears very anxious. Significant medical history including diabetes, CHF, history of stent placement, and chronic pancreatitis. Initial EKG reassuring. Physical exam showed significant tenderness to palpation of back, chest, and abdomen. She has not had any long travel, leg swelling, or leg pain.  [[place]]  4356 On reassessment, patient says the pain is still there, but not quite as sharp. She still feels nauseous. Troponin negative, EKG without signs of STEMI, and nitroglycerin did not help pain, indicating cause likely not ACS. Ethanol slightly elevated, lipase normal. CT reads chronic pancreatitis with no acute inflammation at this time. Possible early/low-grade bowel obstruction. Will contact surgery consult.  [[place]]  8616 Reassessment shows patient states she is feeling better, and much less nauseous. Discussed admission to the hospital and surgery recommended admission and observation.  [[place]]    Clinical Course User Index [[place]] Stephanie Littman, PA-C      Final Clinical Impressions(s) / ED Diagnoses   Final diagnoses:  Abdominal pain, unspecified abdominal location    New Prescriptions New Prescriptions   No medications on file     Franchot Heidelberg, PA-C 08/28/16 [place], MD 08/28/16 1641

## 2016-08-28 NOTE — H&P (Signed)
History and Physical    CARMAN AUXIER KXF:[id number] DOB: Nov 17, 1965 DOA: 08/28/2016  PCP: Gaynelle Arabian, MD Patient coming from: Home  Chief Complaint: Abd Pain  HPI: KOLBY MYUNG is a 51 y.o. female with medical history significant of diabetes, sarcoidosis, hyperlipidemia, OSA on CPAP, leiomyoma of the uterus, headaches, CAD status post CABG, chronic pancreatitis, CHF. Patient presenting with sudden onset abdominal pain. Abdominally in the upper abdomen with radiation to the flank and back. Constant. Improves with rest. Worse with movement. Patient endorses symptoms of anorexia since onset. Pain started approximately 08:20. Patient reports 2 loose stools since onset of symptoms. Patient states that she developed intermittent loose stools starting on 08/25/2016 without any other symptoms. Patient denies any previous abdominal surgeries other than laparoscopic tubal ligation. Patient does endorse drinking 4 glasses of wine daily at her baseline and drink an entire bottle of wine the night prior to admission. Patient took 200 mg of ibuprofen on day of admission without improvement. Patient has never expressed panic this. Patient smokes 1/2-3/4 of a pack of cigarettes per day. Denies any fevers, chest pain, shortness of breath, palpitations, dysuria, frequency, neck stiffness, headache, focal neurological deficits, cognitive decline, hematochezia, melena, hematemesis.   ED Course: Objective findings outlined below. Patient given medications for symptomatic relief. Surgery consult due to early SBO.  Review of Systems: As per HPI otherwise all other systems reviewed and are negative  Ambulatory Status:no restrictions  Past Medical History:  Diagnosis Date  . CHF (congestive heart failure) ([place])   . Chronic pancreatitis ([place])   . Coronary atherosclerosis of unspecified type of vessel, native or graft   . IRCVELFY(101.7)    "weekly" (07/29/2013)  . Hypertension   . Leiomyoma of uterus,  unspecified   . Loss of weight   . Obesity, unspecified   . OSA (obstructive sleep apnea)    no CPAP  . Pure hypercholesterolemia   . Sarcoidosis   . Shortness of breath    none since stent 2 yrs ago  . Type II diabetes mellitus ([place])     Past Surgical History:  Procedure Laterality Date  . CORONARY ANGIOPLASTY WITH STENT PLACEMENT  09/2009   "1"  . CORONARY ARTERY BYPASS GRAFT  ~ 2000   CABG X3  . HYSTEROSCOPY WITH NOVASURE N/A 09/04/2012   Procedure: HYSTEROSCOPY WITH NOVASURE;  Surgeon: Mora Bellman, MD;  Location: [facility];  Service: Gynecology;  Laterality: N/A;  with removal intrauterine device  . TUBAL LIGATION  1997    Social History   Social History  . Marital status: Married    Spouse name: N/A  . Number of children: N/A  . Years of education: N/A   Occupational History  . Accountant    Social History Main Topics  . Smoking status: Current Every Day Smoker    Packs/day: 0.50    Years: 28.00    Types: Cigarettes  . Smokeless tobacco: Never Used  . Alcohol use 16.8 oz/week    28 Glasses of wine per week     Comment: 07/29/2013 "3-4 glasses of wine qd", denies 01/27/2015  . Drug use: No  . Sexual activity: Yes    Birth control/ protection: Surgical   Other Topics Concern  . Not on file   Social History Narrative  . No narrative on file    Allergies  Allergen Reactions  . Lisinopril Hives    Family History  Problem Relation Age of Onset  . Diabetes Mother   . Coronary artery disease  Mother   . Heart disease Mother   . Hyperlipidemia Mother     Prior to Admission medications   Medication Sig Start Date End Date Taking? Authorizing Provider  albuterol (PROVENTIL HFA;VENTOLIN HFA) 108 (90 BASE) MCG/ACT inhaler Inhale 2 puffs into the lungs every 6 (six) hours as needed for wheezing or shortness of breath. 08/13/13   Byrum, Rose Fillers, MD  aspirin 325 MG tablet Take 325 mg by mouth daily.     [provider]  clopidogrel (PLAVIX) 75 MG tablet  Take 75 mg by mouth daily with breakfast.     [provider]  cyclobenzaprine (FLEXERIL) 5 MG tablet 1 pill by mouth up to every 8 hours as needed. Start with one pill by mouth each bedtime as needed due to sedation 06/19/15   Wendie Agreste, MD  dicyclomine (BENTYL) 20 MG tablet Take 1 tablet (20 mg total) by mouth 2 (two) times daily. Patient not taking: Reported on 07/02/2016 02/18/15   Sam, Olivia Canter, PA-C  hydrochlorothiazide (HYDRODIURIL) 25 MG tablet Take 12.5 mg by mouth every morning.  08/15/12   Oswald Hillock, MD  HYDROcodone-acetaminophen (NORCO) 7.5-325 MG tablet Take 1 tablet by mouth every 6 (six) hours as needed for moderate pain. Patient not taking: Reported on 07/02/2016 10/12/15   Lanae Crumbly, PA-C  HYDROcodone-acetaminophen (NORCO/VICODIN) 5-325 MG tablet Take 1 tablet by mouth every 6 (six) hours as needed for pain. 06/12/16   [provider]  ibuprofen (ADVIL,MOTRIN) 200 MG tablet Take 400-600 mg by mouth every 6 (six) hours as needed for moderate pain.    [provider]  losartan (COZAAR) 50 MG tablet Take 50 mg by mouth daily. 06/11/16   [provider]  metFORMIN (GLUCOPHAGE) 500 MG tablet Take 500 mg by mouth daily with breakfast. Take with food    [provider]  metoprolol (LOPRESSOR) 100 MG tablet Take 100 mg by mouth daily.  05/30/15   [provider]  ondansetron (ZOFRAN ODT) 4 MG disintegrating tablet Take 1 tablet (4 mg total) by mouth every 8 (eight) hours as needed. 10/12/15   Lanae Crumbly, PA-C  pantoprazole (PROTONIX) 40 MG tablet Take 1 tablet (40 mg total) by mouth daily. 07/02/16   Lajean Saver, MD  pravastatin (PRAVACHOL) 20 MG tablet Take 20 mg by mouth daily.     [provider]    Physical Exam: Vitals:   08/28/16 1315 08/28/16 1330 08/28/16 1345 08/28/16 1400  BP: (!) 148/91 139/90 121/84 127/84  Pulse: 77 77 66 72  Resp: 19 14 12 16   Temp:      TempSrc:      SpO2: 97% 100% 99% 99%    Weight:      Height:         General:  Appears calm and comfortable Eyes:  PERRL, EOMI, normal lids, iris ENT:  grossly normal hearing, lips & tongue, mmm Neck:  no LAD, masses or thyromegaly Cardiovascular:  RRR, no m/r/g. No LE edema.  Respiratory:  CTA bilaterally, no w/r/r. Normal respiratory effort. Abdomen: Hypoactive bowel sounds, nondistended, diffuse mild tenderness across the periumbilical to upper abdominal regions with lateral extension. Skin:  no rash or induration seen on limited exam Musculoskeletal:  grossly normal tone BUE/BLE, good ROM, no bony abnormality Psychiatric:  grossly normal mood and affect, speech fluent and appropriate, AOx3 Neurologic:  CN 2-12 grossly intact, moves all extremities in coordinated fashion, sensation intact  Labs on Admission: I have personally reviewed following  labs and imaging studies  CBC:  Recent Labs Lab 08/28/16 0950  WBC 8.8  HGB 15.8*  HCT 46.8*  MCV 93.8  PLT 858   Basic Metabolic Panel:  Recent Labs Lab 08/28/16 0950  NA 138  K 3.7  CL 105  CO2 22  GLUCOSE 94  BUN 11  CREATININE 0.78  CALCIUM 9.5   GFR: Estimated Creatinine Clearance: 72.4 mL/min (by C-G formula based on SCr of 0.78 mg/dL). Liver Function Tests:  Recent Labs Lab 08/28/16 1007  AST 64*  ALT 42  ALKPHOS 60  BILITOT 0.9  PROT 7.2  ALBUMIN 3.8    Recent Labs Lab 08/28/16 1007  LIPASE 21   No results for input(s): AMMONIA in the last 168 hours. Coagulation Profile: No results for input(s): INR, PROTIME in the last 168 hours. Cardiac Enzymes: No results for input(s): CKTOTAL, CKMB, CKMBINDEX, TROPONINI in the last 168 hours. BNP (last 3 results) No results for input(s): PROBNP in the last 8760 hours. HbA1C: No results for input(s): HGBA1C in the last 72 hours. CBG: No results for input(s): GLUCAP in the last 168 hours. Lipid Profile: No results for input(s): CHOL, HDL, LDLCALC, TRIG, CHOLHDL, LDLDIRECT in the last 72  hours. Thyroid Function Tests: No results for input(s): TSH, T4TOTAL, FREET4, T3FREE, THYROIDAB in the last 72 hours. Anemia Panel: No results for input(s): VITAMINB12, FOLATE, FERRITIN, TIBC, IRON, RETICCTPCT in the last 72 hours. Urine analysis:    Component Value Date/Time   COLORURINE YELLOW 08/28/2016 1242   APPEARANCEUR CLEAR 08/28/2016 1242   LABSPEC 1.041 (H) 08/28/2016 1242   PHURINE 5.0 08/28/2016 1242   GLUCOSEU NEGATIVE 08/28/2016 1242   HGBUR NEGATIVE 08/28/2016 1242   HGBUR negative 03/16/2010 1107   BILIRUBINUR NEGATIVE 08/28/2016 1242   KETONESUR NEGATIVE 08/28/2016 1242   PROTEINUR NEGATIVE 08/28/2016 1242   UROBILINOGEN 1.0 10/12/2015 1910   NITRITE NEGATIVE 08/28/2016 1242   LEUKOCYTESUR NEGATIVE 08/28/2016 1242    Creatinine Clearance: Estimated Creatinine Clearance: 72.4 mL/min (by C-G formula based on SCr of 0.78 mg/dL).  Sepsis Labs: @LABRCNTIP (procalcitonin:4,lacticidven:4) )No results found for this or any previous visit (from the past 240 hour(s)).   Radiological Exams on Admission: Ct Abdomen Pelvis W Contrast  Result Date: 08/28/2016 CLINICAL DATA:  Diffuse abdominal pain EXAM: CT ABDOMEN AND PELVIS WITH CONTRAST TECHNIQUE: Multidetector CT imaging of the abdomen and pelvis was performed using the standard protocol following bolus administration of intravenous contrast. CONTRAST:  145mL ISOVUE-300 IOPAMIDOL (ISOVUE-300) INJECTION 61% COMPARISON:  02/18/2015 FINDINGS: Lower chest: Linear atelectasis in the left base.  No effusions. Hepatobiliary: Suspect mild diffuse fatty infiltration of the liver. No focal abnormality. Gallbladder unremarkable. Pancreas: Diffuse pancreatic calcifications compatible with chronic calcific pancreatitis. No evidence of acute pancreatitis. Spleen: No focal abnormality.  Normal size. Adrenals/Urinary Tract: No adrenal abnormality. No focal renal abnormality. No stones or hydronephrosis. Urinary bladder is unremarkable.  Stomach/Bowel: Normal appendix. Distal small bowel loops are decompressed. Proximal and mid small bowel loops are mildly dilated. Findings concerning for early partial small bowel obstruction. Exact transition not visualized. Large bowel unremarkable. Stomach unremarkable. Vascular/Lymphatic: Aortic and iliac calcifications. No aneurysm or adenopathy. Reproductive: Numerous uterine fibroids.  No adnexal masses. Other: No free fluid or free air. Musculoskeletal: No acute bony abnormality. IMPRESSION: Mildly dilated proximal and mid small bowel loops with decompressed distal small bowel. Findings concerning for early/ low-grade partial small bowel obstruction. Changes of chronic pancreatitis with calcifications throughout the pancreas. Fibroid uterus. Normal appendix. Suspect mild fatty infiltration of the  liver. Electronically Signed   By: Rolm Baptise M.D.   On: 08/28/2016 11:21    EKG: Independently reviewed. Sinus, brady  Assessment/Plan Active Problems:   Alcohol abuse, continuous   Upper abdominal pain   COPD (chronic obstructive pulmonary disease) (H[facility])   SBO (small bowel obstruction) ([facility])   History of coronary artery bypass graft   COPD with chronic bronchitis ([facility])   Essential hypertension   Chronic pain   ETOH abuse   Early SBO: No previous abdominal surgeries. Patient may have had mild viral gastrointestinal infection 2-3 days leading up to admission that may have contributed. No significant emesis at this time so likely no benefit of NG tube at this time. - Nothing by mouth except sips with meds - Follow-up surgery recommendations - Pain control. We'll minimize narcotic utilization.  - NG tube to low intermittent suction and small bowel obstruction study is symptoms worsen  ETOH abuse: drinks 4 glasses of wine at baseline. Patient drank an entire polyp wine the night prior to admission. EtOH level 18. Patient somewhat tremulous and appears to be in withdrawal - Ativan 2 mg IV 1  now - CIWA  Abdominal pain: Likely multifactorial including EtOH withdrawal, early SBO, and viral gastroenteritis. Doubt pancreatitis though may be an early stages. - Treatment as above - Zofran, Toradol, fentanyl when necessary - Lipase in am  DM: - SSI  HTN:  - Continue Losartan, Lopressor, HCTZ  CAD/CABG:  - continue ASA, Plavix, Statin  OSA/CPAP/COPD: - continue prn albuterol and CPAP QHS  GERD: - change to IV protonix  Chronic pain: Pt not taking home norco. At baseline.  - change PO flexeril to IV Robaxin     DVT prophylaxis: Hep  Code Status: full  Family Communication: Husband  Disposition Plan: pendingi mprovement  Consults called: none  Admission status: observation     MERRELL, DAVID J MD [facility]  If 7PM-7AM, please contact night-coverage [email or web address] Password [email or web address]  08/28/2016, 2:24 PM

## 2016-08-28 NOTE — ED Triage Notes (Signed)
While in the shower this am pt began to have 10/10 left sided chest pain that radiates into back and middle of chest. Pt states she felt nauseous and dizzy also. Pt has history of stent and CAGB, pt is tearful in triage in 10/10 pain. Pt had 324mg  ASA PTA and 2 sl nitros.

## 2016-08-29 ENCOUNTER — Encounter (HOSPITAL_COMMUNITY): Payer: Self-pay

## 2016-08-29 ENCOUNTER — Observation Stay (HOSPITAL_COMMUNITY): Payer: 59

## 2016-08-29 DIAGNOSIS — I509 Heart failure, unspecified: Secondary | ICD-10-CM | POA: Diagnosis present

## 2016-08-29 DIAGNOSIS — F10239 Alcohol dependence with withdrawal, unspecified: Secondary | ICD-10-CM | POA: Diagnosis present

## 2016-08-29 DIAGNOSIS — I10 Essential (primary) hypertension: Secondary | ICD-10-CM | POA: Diagnosis not present

## 2016-08-29 DIAGNOSIS — I110 Hypertensive heart disease with heart failure: Secondary | ICD-10-CM | POA: Diagnosis present

## 2016-08-29 DIAGNOSIS — K5669 Other partial intestinal obstruction: Secondary | ICD-10-CM | POA: Diagnosis not present

## 2016-08-29 DIAGNOSIS — E1121 Type 2 diabetes mellitus with diabetic nephropathy: Secondary | ICD-10-CM | POA: Diagnosis present

## 2016-08-29 DIAGNOSIS — Z955 Presence of coronary angioplasty implant and graft: Secondary | ICD-10-CM | POA: Diagnosis not present

## 2016-08-29 DIAGNOSIS — K860 Alcohol-induced chronic pancreatitis: Secondary | ICD-10-CM | POA: Diagnosis present

## 2016-08-29 DIAGNOSIS — R1084 Generalized abdominal pain: Secondary | ICD-10-CM | POA: Diagnosis not present

## 2016-08-29 DIAGNOSIS — A084 Viral intestinal infection, unspecified: Secondary | ICD-10-CM | POA: Diagnosis present

## 2016-08-29 DIAGNOSIS — G8929 Other chronic pain: Secondary | ICD-10-CM | POA: Diagnosis present

## 2016-08-29 DIAGNOSIS — D869 Sarcoidosis, unspecified: Secondary | ICD-10-CM | POA: Diagnosis present

## 2016-08-29 DIAGNOSIS — E440 Moderate protein-calorie malnutrition: Secondary | ICD-10-CM | POA: Diagnosis present

## 2016-08-29 DIAGNOSIS — F17210 Nicotine dependence, cigarettes, uncomplicated: Secondary | ICD-10-CM | POA: Diagnosis present

## 2016-08-29 DIAGNOSIS — I2510 Atherosclerotic heart disease of native coronary artery without angina pectoris: Secondary | ICD-10-CM | POA: Diagnosis present

## 2016-08-29 DIAGNOSIS — D259 Leiomyoma of uterus, unspecified: Secondary | ICD-10-CM | POA: Diagnosis present

## 2016-08-29 DIAGNOSIS — J449 Chronic obstructive pulmonary disease, unspecified: Secondary | ICD-10-CM | POA: Diagnosis not present

## 2016-08-29 DIAGNOSIS — Z7982 Long term (current) use of aspirin: Secondary | ICD-10-CM | POA: Diagnosis not present

## 2016-08-29 DIAGNOSIS — R109 Unspecified abdominal pain: Secondary | ICD-10-CM | POA: Diagnosis not present

## 2016-08-29 DIAGNOSIS — G4733 Obstructive sleep apnea (adult) (pediatric): Secondary | ICD-10-CM | POA: Diagnosis present

## 2016-08-29 DIAGNOSIS — Z7902 Long term (current) use of antithrombotics/antiplatelets: Secondary | ICD-10-CM | POA: Diagnosis not present

## 2016-08-29 DIAGNOSIS — Z6821 Body mass index (BMI) 21.0-21.9, adult: Secondary | ICD-10-CM | POA: Diagnosis not present

## 2016-08-29 DIAGNOSIS — Z7984 Long term (current) use of oral hypoglycemic drugs: Secondary | ICD-10-CM | POA: Diagnosis not present

## 2016-08-29 DIAGNOSIS — K219 Gastro-esophageal reflux disease without esophagitis: Secondary | ICD-10-CM | POA: Diagnosis present

## 2016-08-29 DIAGNOSIS — K56609 Unspecified intestinal obstruction, unspecified as to partial versus complete obstruction: Secondary | ICD-10-CM | POA: Diagnosis not present

## 2016-08-29 DIAGNOSIS — F1010 Alcohol abuse, uncomplicated: Secondary | ICD-10-CM | POA: Diagnosis not present

## 2016-08-29 DIAGNOSIS — E11649 Type 2 diabetes mellitus with hypoglycemia without coma: Secondary | ICD-10-CM | POA: Diagnosis present

## 2016-08-29 DIAGNOSIS — R1010 Upper abdominal pain, unspecified: Secondary | ICD-10-CM | POA: Diagnosis not present

## 2016-08-29 DIAGNOSIS — Y900 Blood alcohol level of less than 20 mg/100 ml: Secondary | ICD-10-CM | POA: Diagnosis present

## 2016-08-29 DIAGNOSIS — E785 Hyperlipidemia, unspecified: Secondary | ICD-10-CM | POA: Diagnosis present

## 2016-08-29 DIAGNOSIS — K56600 Partial intestinal obstruction, unspecified as to cause: Secondary | ICD-10-CM | POA: Diagnosis not present

## 2016-08-29 DIAGNOSIS — E1140 Type 2 diabetes mellitus with diabetic neuropathy, unspecified: Secondary | ICD-10-CM | POA: Diagnosis present

## 2016-08-29 DIAGNOSIS — R0602 Shortness of breath: Secondary | ICD-10-CM | POA: Diagnosis not present

## 2016-08-29 LAB — GLUCOSE, CAPILLARY
GLUCOSE-CAPILLARY: 38 mg/dL — AB (ref 65–99)
GLUCOSE-CAPILLARY: 156 mg/dL — AB (ref 65–99)
GLUCOSE-CAPILLARY: 127 mg/dL — AB (ref 65–99)
Glucose-Capillary: 37 mg/dL — CL (ref 65–99)
Glucose-Capillary: 63 mg/dL — ABNORMAL LOW (ref 65–99)
Glucose-Capillary: 71 mg/dL (ref 65–99)

## 2016-08-29 LAB — COMPREHENSIVE METABOLIC PANEL
ALK PHOS: 49 U/L (ref 38–126)
ALT: 32 U/L (ref 14–54)
ANION GAP: 11 (ref 5–15)
AST: 40 U/L (ref 15–41)
Albumin: 3.3 g/dL — ABNORMAL LOW (ref 3.5–5.0)
BILIRUBIN TOTAL: 1 mg/dL (ref 0.3–1.2)
BUN: 13 mg/dL (ref 6–20)
CALCIUM: 9.2 mg/dL (ref 8.9–10.3)
CO2: 22 mmol/L (ref 22–32)
Chloride: 102 mmol/L (ref 101–111)
Creatinine, Ser: 0.96 mg/dL (ref 0.44–1.00)
GFR calc non Af Amer: >60 mL/min (ref >60)
Glucose, Bld: 50 mg/dL — ABNORMAL LOW (ref 65–99)
Potassium: 3.6 mmol/L (ref 3.5–5.1)
SODIUM: 135 mmol/L (ref 135–145)
TOTAL PROTEIN: 6.1 g/dL — AB (ref 6.5–8.1)

## 2016-08-29 LAB — CBC
HCT: 44.1 % (ref 36.0–46.0)
HEMOGLOBIN: 14.1 g/dL (ref 12.0–15.0)
MCH: 30.7 pg (ref 26.0–34.0)
MCHC: 32 g/dL (ref 30.0–36.0)
MCV: 96.1 fL (ref 78.0–100.0)
Platelets: 174 10*3/uL (ref 150–400)
RBC: 4.59 MIL/uL (ref 3.87–5.11)
RDW: 12.3 % (ref 11.5–15.5)
WBC: 6 10*3/uL (ref 4.0–10.5)

## 2016-08-29 LAB — HIV ANTIBODY (ROUTINE TESTING W REFLEX): HIV Screen 4th Generation wRfx: NONREACTIVE

## 2016-08-29 LAB — LIPASE, BLOOD: Lipase: 16 U/L (ref 11–51)

## 2016-08-29 MED ORDER — POTASSIUM CHLORIDE 10 MEQ/100ML IV SOLN
10.0000 meq | INTRAVENOUS | Status: AC
  Administered 2016-08-29 (×3): 10 meq via INTRAVENOUS
  Filled 2016-08-29 (×2): qty 100

## 2016-08-29 MED ORDER — SODIUM CHLORIDE 0.45 % IV SOLN
INTRAVENOUS | Status: DC
  Administered 2016-08-29 – 2016-08-31 (×4): via INTRAVENOUS

## 2016-08-29 MED ORDER — POTASSIUM CHLORIDE 10 MEQ/100ML IV SOLN
INTRAVENOUS | Status: AC
  Administered 2016-08-29: 10 meq via INTRAVENOUS
  Filled 2016-08-29: qty 100

## 2016-08-29 MED ORDER — DEXTROSE-NACL 5-0.45 % IV SOLN: INTRAVENOUS | Status: DC

## 2016-08-29 NOTE — Progress Notes (Signed)
Pt given 4oz of OJ to increase CBG. Pt's glucose measured at 38 3 minutes prior.    Will recheck glucose in 15

## 2016-08-29 NOTE — Progress Notes (Signed)
Initial Nutrition Assessment  DOCUMENTATION CODES:   Non-severe (moderate) malnutrition in context of chronic illness  INTERVENTION:   -Await diet progression as tolerated -Addition of Ensure Enlive po BID, each supplement provides 350 kcal and 20 grams of protein, once diet advanced   NUTRITION DIAGNOSIS:   Malnutrition (Moderate) related to chronic illness (DM, sarcoidosis, chronic pancreatitis, EtOH abuse, CHF) as evidenced by mild depletion of body fat, mild depletion of muscle mass, energy intake < 75% for > or equal to 1 month, percent weight loss.  GOAL:   Patient will meet greater than or equal to 90% of their needs  MONITOR:   Diet advancement, Supplement acceptance, PO intake, Labs, Weight trends  REASON FOR ASSESSMENT:   Malnutrition Screening Tool    ASSESSMENT:   51 yo female admitted with sudden onset of abdominal pain with early SBO; +EtOH abuse on CIWA. Pt with hx of DM, sarcoidosis, HL, CAD/CABG, chronic pancreatitis, CHF.   Pt hypoglycemic this AM, D5 started. Denies N/V/abdominal pain this AM.  Pt reports she is very little at baseline. Pt typically eats 1 kids meal per day and that is all. Pt drinks Ensure daily when she has it available but does not have currently.  Noted pt reports drinking 4 glasses of wine daily.   Pt reports 10-15 pound wt loss in past few months. >/= 8.1% wt loss in 3 months.    08/29/16 113 lb 9.6 oz (51.5 kg)  10/12/15 135 lb (61.2 kg)  06/19/15 124 lb (56.2 kg)  02/24/15 125 lb 12.8 oz (57.1 kg)  02/18/15 127 lb 12.8 oz (58 kg)  09/21/14 146 lb (66.2 kg)  01/28/14 146 lb 1.6 oz (66.3 kg)  08/13/13 140 lb 9.6 oz (63.8 kg)  07/31/13 148 lb 11.2 oz (67.4 kg)  08/28/12 149 lb (67.6 kg)   Nutrition-Focused physical exam completed. Findings are mild fat depletion, mild muscle depletion, and no edema.   Labs: lipase wdl, CBGs 37-77 Meds: 1/2 NS at 75 ml/hr changed to D5-1/2 NS at 75 this AM, MVI, thiamine, folic acid  Diet  Order:  Diet NPO time specified Except for: Sips with Meds  Skin:  Reviewed, no issues  Last BM:  08/28/16  Height:   Ht Readings from Last 1 Encounters:  08/28/16 5\' 2"  (1.575 m)    Weight:   Wt Readings from Last 1 Encounters:  08/29/16 113 lb 9.6 oz (51.5 kg)   BMI:  Body mass index is 20.78 kg/m.  Estimated Nutritional Needs:   Kcal:  1560-1820 kcals  Protein:  78-91 g  Fluid:  >/= 1.5 L  EDUCATION NEEDS:   No education needs identified at this time  [place], [place], LDN [phone] Pager  [phone] Weekend/On-Call Pager

## 2016-08-29 NOTE — Progress Notes (Signed)
[facility] Progress Note    Subjective: Patient sitting up at the bedside, rocking back and forth because of abdominal discomfort and difficulty breathing.    Objective: Vital signs in last 24 hours: Temp:  [97.1 F (36.2 C)-98.2 F (36.8 C)] 97.4 F (36.3 C) (06/06 0448) Pulse Rate:  [53-94] 70 (06/06 0448) Resp:  [12-35] 17 (06/06 0448) BP: (98-160)/(56-104) 130/78 (06/06 0448) SpO2:  [85 %-100 %] 100 % (06/06 0448) Weight:  [50.6 kg (111 lb 9.6 oz)-61.2 kg (135 lb)] 51.5 kg (113 lb 9.6 oz) (06/06 0448) Last BM Date: 08/28/16  Intake/Output from previous day: 06/05 0701 - 06/06 0700 In: 1116.3 [I.V.:1116.3] Out: 200 [Urine:200] Intake/Output this shift: No intake/output data recorded.  General: Overall  Not feeling much better than yesterday.  Lungs: Diminished globally  Abd: Soft, hypoactive bowel sounds.  No peritonitis.  Nausea and vomiting throughout the evening according to the patient.  Not distended or tense to me  Extremities: No changes  Neuro: Intact  Lab Results:  @LABLAST2 (wbc:2,hgb:2,hct:2,plt:2) BMET ) Recent Labs  08/28/16 0950 08/28/16 1456 08/29/16 0501  NA 138  --  135  K 3.7  --  3.6  CL 105  --  102  CO2 22  --  22  GLUCOSE 94  --  50*  BUN 11  --  13  CREATININE 0.78 0.80 0.96  CALCIUM 9.5  --  9.2   PT/INR No results for input(s): LABPROT, INR in the last 72 hours. ABG No results for input(s): PHART, HCO3 in the last 72 hours.  Invalid input(s): PCO2, PO2  Studies/Results: Ct Abdomen Pelvis W Contrast  Result Date: 08/28/2016 CLINICAL DATA:  Diffuse abdominal pain EXAM: CT ABDOMEN AND PELVIS WITH CONTRAST TECHNIQUE: Multidetector CT imaging of the abdomen and pelvis was performed using the standard protocol following bolus administration of intravenous contrast. CONTRAST:  145mL ISOVUE-300 IOPAMIDOL (ISOVUE-300) INJECTION 61% COMPARISON:  02/18/2015 FINDINGS: Lower chest: Linear atelectasis in the left base.  No effusions.  Hepatobiliary: Suspect mild diffuse fatty infiltration of the liver. No focal abnormality. Gallbladder unremarkable. Pancreas: Diffuse pancreatic calcifications compatible with chronic calcific pancreatitis. No evidence of acute pancreatitis. Spleen: No focal abnormality.  Normal size. Adrenals/Urinary Tract: No adrenal abnormality. No focal renal abnormality. No stones or hydronephrosis. Urinary bladder is unremarkable. Stomach/Bowel: Normal appendix. Distal small bowel loops are decompressed. Proximal and mid small bowel loops are mildly dilated. Findings concerning for early partial small bowel obstruction. Exact transition not visualized. Large bowel unremarkable. Stomach unremarkable. Vascular/Lymphatic: Aortic and iliac calcifications. No aneurysm or adenopathy. Reproductive: Numerous uterine fibroids.  No adnexal masses. Other: No free fluid or free air. Musculoskeletal: No acute bony abnormality. IMPRESSION: Mildly dilated proximal and mid small bowel loops with decompressed distal small bowel. Findings concerning for early/ low-grade partial small bowel obstruction. Changes of chronic pancreatitis with calcifications throughout the pancreas. Fibroid uterus. Normal appendix. Suspect mild fatty infiltration of the liver. Electronically Signed   By: Rolm Baptise M.D.   On: 08/28/2016 11:21    Anti-infectives: Anti-infectives    None      Assessment/Plan: s/p  Will check plan abdominal X-rays.  Afraid to give patient oral contrast for SB study because of risk of aspiration.    LOS: 0 days   Kathryne Eriksson. Dahlia Bailiff, MD, FACS [phone] [facility] 08/29/2016

## 2016-08-29 NOTE — Progress Notes (Signed)
PROGRESS NOTE  Marie [name]  QPY:[id number] DOB: [birth year] DOA: 08/28/2016 PCP: Gaynelle Arabian, MD Outpatient Specialists:  Subjective: Awake alert this morning, still complaining about nausea but no vomiting. Per RN glucose with 37 this morning but she was awake and alert without symptoms.  Brief Narrative:  Marie [name] is [name] 51 y.o. female with medical history significant of diabetes, sarcoidosis, hyperlipidemia, OSA on CPAP, leiomyoma of the uterus, headaches, CAD status post CABG, chronic pancreatitis, CHF. Patient presenting with sudden onset abdominal pain. Abdominally in the upper abdomen with radiation to the flank and back. Constant. Improves with rest. Worse with movement. Patient endorses symptoms of anorexia since onset. Pain started approximately 08:20. Patient reports 2 loose stools since onset of symptoms. Patient states that she developed intermittent loose stools starting on 08/25/2016 without any other symptoms. Patient denies any previous abdominal surgeries other than laparoscopic tubal ligation. Patient does endorse drinking 4 glasses of wine daily at her baseline and drink an entire bottle of wine the night prior to admission. Patient took 200 mg of ibuprofen on day of admission without improvement. Patient has never expressed panic this. Patient smokes 1/2-3/4 of [name] pack of cigarettes per day. Denies any fevers, chest pain, shortness of breath, palpitations, dysuria, frequency, neck stiffness, headache, focal neurological deficits, cognitive decline, hematochezia, melena, hematemesis.  Assessment & Plan:   Active Problems:   Alcohol abuse, continuous   Upper abdominal pain   COPD (chronic obstructive pulmonary disease) (HCC)   SBO (small bowel obstruction) (HCC)   History of coronary artery bypass graft   COPD with chronic bronchitis (HCC)   Essential hypertension   Chronic pain   ETOH abuse     Early SBO No previous abdominal surgeries. Patient may have had  mild viral gastrointestinal infection 2-3 days leading up to admission that may have contributed. No significant emesis at this time so likely no benefit of NG tube at this time. -Nothing by mouth except sips with meds, NG tube if started to vomit. -No BM for today follow clinically.  Hypoglycemia -Significant hypoglycemia with blood glucose of 37, patient is diabetic but did not get any insulin last night. -Started on D5/half-normal saline at 75, I would rather keep her in the high side rather than hypoglycemic.  ETOH abuse: drinks 4 glasses of wine at baseline. Patient drank an entire polyp wine the night prior to admission. EtOH level 18. Patient somewhat tremulous and appears to be in withdrawal - Ativan 2 mg IV 1 now - CIWA  Abdominal pain: Likely multifactorial including EtOH withdrawal, early SBO, and viral gastroenteritis. Doubt pancreatitis though may be an early stages. - Treatment as above - Zofran, Toradol, fentanyl when necessary - Lipase in am  DM: - SSI  HTN:  - Continue Losartan, Lopressor, HCTZ  CAD/CABG:  - continue ASA, Plavix, Statin  OSA/CPAP/COPD: - continue prn albuterol and CPAP QHS  GERD: - change to IV protonix  Chronic pain: Pt not taking home norco. At baseline.  - change PO flexeril to IV Robaxin    DVT prophylaxis:  Code Status: Full Code Family Communication:  Disposition Plan:  Diet: Diet NPO time specified Except for: Sips with Meds  Consultants:   None  Procedures:   None  Antimicrobials:   None   Objective: Vitals:   08/28/16 2345 08/29/16 0059 08/29/16 0448 08/29/16 1021  BP:  112/76 130/78 106/69  Pulse: (!) 53 70 70 60  Resp: 16 17 17    Temp:  97.8 F (36.6  C) 97.4 F (36.3 C)   TempSrc:  Oral Oral   SpO2: 100% 100% 100%   Weight:   51.5 kg (113 lb 9.6 oz)   Height:        Intake/Output Summary (Last 24 hours) at 08/29/16 1123 Last data filed at 08/29/16 0700  Gross per 24 hour  Intake           1116.25 ml  Output              200 ml  Net           916.25 ml   Filed Weights   08/28/16 0945 08/28/16 1603 08/29/16 0448  Weight: 61.2 kg (135 lb) 50.6 kg (111 lb 9.6 oz) 51.5 kg (113 lb 9.6 oz)    Examination: General exam: Appears calm and comfortable  Respiratory system: Clear to auscultation. Respiratory effort normal. Cardiovascular system: S1 & S2 heard, RRR. No JVD, murmurs, rubs, gallops or clicks. No pedal edema. Gastrointestinal system: Abdomen is nondistended, soft and nontender. No organomegaly or masses felt. Normal bowel sounds heard. Central nervous system: Alert and oriented. No focal neurological deficits. Extremities: Symmetric 5 x 5 power. Skin: No rashes, lesions or ulcers Psychiatry: Judgement and insight appear normal. Mood & affect appropriate.   Data Reviewed: I have personally reviewed following labs and imaging studies  CBC:  Recent Labs Lab 08/28/16 0950 08/28/16 1456 08/29/16 0501  WBC 8.8 6.9 6.0  HGB 15.8* 15.0 14.1  HCT 46.8* 44.9 44.1  MCV 93.8 94.1 96.1  PLT 227 207 829   Basic Metabolic Panel:  Recent Labs Lab 08/28/16 0950 08/28/16 1456 08/29/16 0501  NA 138  --  135  K 3.7  --  3.6  CL 105  --  102  CO2 22  --  22  GLUCOSE 94  --  50*  BUN 11  --  13  CREATININE 0.78 0.80 0.96  CALCIUM 9.5  --  9.2   GFR: Estimated Creatinine Clearance: 55.4 mL/min (by C-G formula based on SCr of 0.96 mg/dL). Liver Function Tests:  Recent Labs Lab 08/28/16 1007 08/29/16 0501  AST 64* 40  ALT 42 32  ALKPHOS 60 49  BILITOT 0.9 1.0  PROT 7.2 6.1*  ALBUMIN 3.8 3.3*    Recent Labs Lab 08/28/16 1007 08/29/16 0501  LIPASE 21 16   No results for input(s): AMMONIA in the last 168 hours. Coagulation Profile: No results for input(s): INR, PROTIME in the last 168 hours. Cardiac Enzymes: No results for input(s): CKTOTAL, CKMB, CKMBINDEX, TROPONINI in the last 168 hours. BNP (last 3 results) No results for input(s): PROBNP in the  last 8760 hours. HbA1C: No results for input(s): HGBA1C in the last 72 hours. CBG:  Recent Labs Lab 08/28/16 2250 08/29/16 0823 08/29/16 0852 08/29/16 0923 08/29/16 1117  GLUCAP 74 38* 37* 63* 156*   Lipid Profile: No results for input(s): CHOL, HDL, LDLCALC, TRIG, CHOLHDL, LDLDIRECT in the last 72 hours. Thyroid Function Tests: No results for input(s): TSH, T4TOTAL, FREET4, T3FREE, THYROIDAB in the last 72 hours. Anemia Panel: No results for input(s): VITAMINB12, FOLATE, FERRITIN, TIBC, IRON, RETICCTPCT in the last 72 hours. Urine analysis:    Component Value Date/Time   COLORURINE YELLOW 08/28/2016 1242   APPEARANCEUR CLEAR 08/28/2016 1242   LABSPEC 1.041 (H) 08/28/2016 1242   PHURINE 5.0 08/28/2016 1242   GLUCOSEU NEGATIVE 08/28/2016 1242   HGBUR NEGATIVE 08/28/2016 1242   HGBUR negative 03/16/2010 1107   [place] 08/28/2016 1242  KETONESUR NEGATIVE 08/28/2016 1242   PROTEINUR NEGATIVE 08/28/2016 1242   UROBILINOGEN 1.0 10/12/2015 1910   NITRITE NEGATIVE 08/28/2016 1242   LEUKOCYTESUR NEGATIVE 08/28/2016 1242   Sepsis Labs: @LABRCNTIP (procalcitonin:4,lacticidven:4)  )No results found for this or any previous visit (from the past 240 hour(s)).   Invalid input(s): PROCALCITONIN, LACTICACIDVEN   Radiology Studies: Ct Abdomen Pelvis W Contrast  Result Date: 08/28/2016 CLINICAL DATA:  Diffuse abdominal pain EXAM: CT ABDOMEN AND PELVIS WITH CONTRAST TECHNIQUE: Multidetector CT imaging of the abdomen and pelvis was performed using the standard protocol following bolus administration of intravenous contrast. CONTRAST:  135mL ISOVUE-300 IOPAMIDOL (ISOVUE-300) INJECTION 61% COMPARISON:  02/18/2015 FINDINGS: Lower chest: Linear atelectasis in the left base.  No effusions. Hepatobiliary: Suspect mild diffuse fatty infiltration of the liver. No focal abnormality. Gallbladder unremarkable. Pancreas: Diffuse pancreatic calcifications compatible with chronic calcific  pancreatitis. No evidence of acute pancreatitis. Spleen: No focal abnormality.  Normal size. Adrenals/Urinary Tract: No adrenal abnormality. No focal renal abnormality. No stones or hydronephrosis. Urinary bladder is unremarkable. Stomach/Bowel: Normal appendix. Distal small bowel loops are decompressed. Proximal and mid small bowel loops are mildly dilated. Findings concerning for early partial small bowel obstruction. Exact transition not visualized. Large bowel unremarkable. Stomach unremarkable. Vascular/Lymphatic: Aortic and iliac calcifications. No aneurysm or adenopathy. Reproductive: Numerous uterine fibroids.  No adnexal masses. Other: No free fluid or free air. Musculoskeletal: No acute bony abnormality. IMPRESSION: Mildly dilated proximal and mid small bowel loops with decompressed distal small bowel. Findings concerning for early/ low-grade partial small bowel obstruction. Changes of chronic pancreatitis with calcifications throughout the pancreas. Fibroid uterus. Normal appendix. Suspect mild fatty infiltration of the liver. Electronically Signed   By: Rolm Baptise M.D.   On: 08/28/2016 11:21        Scheduled Meds: . aspirin  325 mg Oral Daily  . clopidogrel  75 mg Oral Q breakfast  . folic acid  1 mg Oral Daily  . heparin  5,000 Units Subcutaneous Q8H  . hydrochlorothiazide  12.5 mg Oral q morning - 10a  . insulin aspart  0-5 Units Subcutaneous QHS  . insulin aspart  0-9 Units Subcutaneous TID WC  . losartan  50 mg Oral Daily  . metoprolol tartrate  100 mg Oral Daily  . multivitamin with minerals  1 tablet Oral Daily  . pantoprazole (PROTONIX) IV  40 mg Intravenous Q24H  . pravastatin  20 mg Oral q1800  . thiamine  100 mg Oral Daily   Or  . thiamine  100 mg Intravenous Daily   Continuous Infusions: . sodium chloride 10 mL/hr at 08/29/16 0930  . dextrose 5 % and 0.45% NaCl    . methocarbamol (ROBAXIN)  IV       LOS: 0 days    Time spent: 35 minutes    Marie Porcelli A,  MD [facility] Pager [phone]  If 7PM-7AM, please contact night-coverage [email or web address] Password [email or web address] 08/29/2016, 11:23 AM

## 2016-08-29 NOTE — Progress Notes (Signed)
Pt states she was eating french fries with her family.

## 2016-08-29 NOTE — Progress Notes (Addendum)
Pt is gone to [employer]

## 2016-08-30 DIAGNOSIS — K56600 Partial intestinal obstruction, unspecified as to cause: Principal | ICD-10-CM

## 2016-08-30 DIAGNOSIS — E440 Moderate protein-calorie malnutrition: Secondary | ICD-10-CM | POA: Insufficient documentation

## 2016-08-30 LAB — BASIC METABOLIC PANEL
Anion gap: 7 (ref 5–15)
BUN: 12 mg/dL (ref 6–20)
CHLORIDE: 102 mmol/L (ref 101–111)
CO2: 26 mmol/L (ref 22–32)
Calcium: 9.1 mg/dL (ref 8.9–10.3)
Creatinine, Ser: 0.84 mg/dL (ref 0.44–1.00)
GFR calc Af Amer: >60 mL/min (ref >60)
GFR calc non Af Amer: >60 mL/min (ref >60)
GLUCOSE: 78 mg/dL (ref 65–99)
POTASSIUM: 4 mmol/L (ref 3.5–5.1)
SODIUM: 135 mmol/L (ref 135–145)

## 2016-08-30 LAB — CBC
HEMATOCRIT: 42.7 % (ref 36.0–46.0)
Hemoglobin: 13.9 g/dL (ref 12.0–15.0)
MCH: 31 pg (ref 26.0–34.0)
MCHC: 32.6 g/dL (ref 30.0–36.0)
MCV: 95.1 fL (ref 78.0–100.0)
Platelets: 175 10*3/uL (ref 150–400)
RBC: 4.49 MIL/uL (ref 3.87–5.11)
RDW: 11.8 % (ref 11.5–15.5)
WBC: 4.7 10*3/uL (ref 4.0–10.5)

## 2016-08-30 LAB — GLUCOSE, CAPILLARY
GLUCOSE-CAPILLARY: 87 mg/dL (ref 65–99)
GLUCOSE-CAPILLARY: 132 mg/dL — AB (ref 65–99)
Glucose-Capillary: 231 mg/dL — ABNORMAL HIGH (ref 65–99)
Glucose-Capillary: 76 mg/dL (ref 65–99)

## 2016-08-30 LAB — MAGNESIUM: Magnesium: 1.7 mg/dL (ref 1.7–2.4)

## 2016-08-30 MED ORDER — KETOROLAC TROMETHAMINE 10 MG PO TABS
10.0000 mg | ORAL_TABLET | Freq: Four times a day (QID) | ORAL | Status: DC
  Filled 2016-08-30 (×2): qty 1

## 2016-08-30 MED ORDER — KETOROLAC TROMETHAMINE 10 MG PO TABS
10.0000 mg | ORAL_TABLET | Freq: Once | ORAL | Status: AC
  Administered 2016-08-30: 10 mg via ORAL
  Filled 2016-08-30: qty 1

## 2016-08-30 MED ORDER — KETOROLAC TROMETHAMINE 10 MG PO TABS
10.0000 mg | ORAL_TABLET | Freq: Four times a day (QID) | ORAL | Status: DC
  Administered 2016-08-30 – 2016-09-01 (×6): 10 mg via ORAL
  Filled 2016-08-30 (×9): qty 1

## 2016-08-30 MED ORDER — FLEET ENEMA 7-19 GM/118ML RE ENEM
1.0000 | ENEMA | Freq: Once | RECTAL | Status: AC
  Administered 2016-08-30: 1 via RECTAL
  Filled 2016-08-30: qty 1

## 2016-08-30 MED ORDER — POLYETHYLENE GLYCOL 3350 17 G PO PACK
17.0000 g | PACK | Freq: Every day | ORAL | Status: DC
  Administered 2016-08-30 – 2016-09-01 (×3): 17 g via ORAL
  Filled 2016-08-30 (×3): qty 1

## 2016-08-30 MED ORDER — PANCRELIPASE (LIP-PROT-AMYL) 12000-38000 UNITS PO CPEP
12000.0000 [IU] | ORAL_CAPSULE | Freq: Three times a day (TID) | ORAL | Status: DC
  Administered 2016-08-30 – 2016-09-01 (×6): 12000 [IU] via ORAL
  Filled 2016-08-30 (×6): qty 1

## 2016-08-30 NOTE — Progress Notes (Addendum)
pt found on floor, states she assisted herself to edge of bed then floor when back pain struck and took her balance. No c/o pain, no direct impact to floor. Pt denies pain other than chronic/ongoing back pain. VS WNL, MD paged.

## 2016-08-30 NOTE — Consult Note (Signed)
   [facility] CM Inpatient Consult   08/30/2016  MILLER EDGINGTON 1965/07/13 [id number]     Came to visit Mrs. Enriqueta Shutter on behalf of [place] to [employer] program for [employer] employees/dependents with [employer]. Her husband is a Furniture conservator/restorer.  Discussed Link to Wellness program for DM management. Provided Link to [employer], 24-hr nurse line magnet, and contact information.   Mrs. Canty states she is not sure if she would sign up for Link to Wellness program due to her work schedule. However, she states she will consider.   Confirmed best contact number for post discharge call as [phone].  Appreciative of visit.    Marthenia Rolling, MSN-Ed, RN,BSN [facility] Liaison [phone]

## 2016-08-30 NOTE — Progress Notes (Signed)
Patient educated on reason for admission, purpose of NPO order, point of IV fluids.   Pt provided a list of vitamins that she is being given to supplement her impaired nutritional absorption.  Pt educated on CIWA protocol and risks.

## 2016-08-30 NOTE — Progress Notes (Signed)
Patient refusing CPAP for tonight. CPAP still in room. RT made pt aware that if she changed her mind to cal.

## 2016-08-30 NOTE — Plan of Care (Signed)
Problem: Safety: Goal: Ability to remain free from injury will improve Outcome: Progressing Patient verbalized understanding of calling for assistance when attempting to ambulate.  She correctly demonstrated use of call bell when needing assistance.  Patient progressing towards goal.

## 2016-08-30 NOTE — Progress Notes (Signed)
PROGRESS NOTE    Marie MCHANEY  BHA:[id number] DOB: [birth year] DOA: 08/28/2016 PCP: Gaynelle Arabian, MD  Outpatient Specialists:     Brief Narrative:  82 ? Prior ETOH Chr Pancreatitis   Admitted 07/2012, 07/2013 for similar event DM TY2 complications of neuropathy, nephropathy Reflux CAD at age 51 on aspirin and Plavix  OSA not on CPAP Current daily smoker half-three quarter PPD Sarcoidosis diagnosed 2007-node biopsy Dr. Arlyce Dice  HTN HLD   admit 08/29/2016 early SBO on CT scan  LFTs normal GFR >60 CBC normal Abdominal x-ray repeated 08/29/2016 p.m. showed resolution of SBO General surgery consult  Later 6/6 found to be in early withdrawal and CIWA started with Ativan       Assessment & Plan:   Active Problems:   Alcohol abuse, continuous   Upper abdominal pain   COPD (chronic obstructive pulmonary disease) (HCC)   SBO (small bowel obstruction) (HCC)   History of coronary artery bypass graft   COPD with chronic bronchitis (HCC)   Essential hypertension   Chronic pain   ETOH abuse   Malnutrition of moderate degree   Early SBO Bilat Tubal ligation as a younger lady No significant emesis at this time so likely no benefit of NG tube at this time. tol clears-some Nausea Had enema with only small effect AXr in am  Hypoglycemia Significant hypoglycemia with blood glucose of 37, patient is diabetic but did not get any insulin 6/6 am Started on D5/half-normal saline at 75 keep her in the high side rather than hypoglycemic.  ETOH abuse: drinks 4 glasses of wine at baseline. Patient drank an entire polyp wine the night prior to admission. EtOH level 18. Patient somewhat tremulous and appears to be in withdrawal Ativan 2 mg IV 1 now CIWA d/c 6/7 as no obj indication  Abdominal pain: Likely multifactorial including EtOH withdrawal, early SBO, and viral gastroenteritis. Doubt pancreatitis though may be an early stages. Treatment as above Zofran, Toradol,  fentanyl when necessary Change to PO toradol  6/7 Avoid narcs  HTN:  Continue Losartan, Lopressor, HCTZ  CAD/CABG:   continue ASA, Plavix, Statin  OSA/CPAP/COPD: continue prn albuterol and CPAP QHS  GERD: change to IV protonix  Chronic pain: Pt not taking home norco. At baseline.  change PO flexeril to IV Robaxin    DVT prophylaxis: (Lovenox/Heparin/SCD's/anticoagulated/None (if comfort care) Code Status: (Full/Partial - specify details) Family Communication: (Specify name, relationship & date discussed. NO "discussed with patient") Disposition Plan: (specify when and where you expect patient to be discharged)   Consultants:    Gen. surgery  Procedures:    CT scan 6/5  DG AX view 6/6  Antimicrobials:    none    Subjective:  Patient upset initially and unclear as to why she is nothing by mouth Doesn't feel subjectively any better Tolerating sips and chips No stool as yet Apparently has been given an enema since I saw her this morning with mild effect   Objective: Vitals:   08/29/16 1021 08/29/16 1213 08/29/16 2300 08/30/16 0610  BP: 106/69 101/65 136/73 121/64  Pulse: 60 (!) 56 60 65  Resp:  18 17 17   Temp:   97.4 F (36.3 C) 98 F (36.7 C)  TempSrc:   Oral Oral  SpO2:  99% 95% 92%  Weight:    52.4 kg (115 lb 8 oz)  Height:        Intake/Output Summary (Last 24 hours) at 08/30/16 9735 Last data filed at 08/30/16 0610  Gross per  24 hour  Intake              320 ml  Output              800 ml  Net             -480 ml   Filed Weights   08/28/16 1603 08/29/16 0448 08/30/16 0610  Weight: 50.6 kg (111 lb 9.6 oz) 51.5 kg (113 lb 9.6 oz) 52.4 kg (115 lb 8 oz)    Examination:  Alert pleasant oriented no apparent distress EOMI NCAT Chest clear Abdomen nondistended slight tenderness epigastrium Patient nontoxic per  neurology logically intact   Data Reviewed: I have personally reviewed following labs and imaging  studies  CBC:  Recent Labs Lab 08/28/16 0950 08/28/16 1456 08/29/16 0501 08/30/16 0321  WBC 8.8 6.9 6.0 4.7  HGB 15.8* 15.0 14.1 13.9  HCT 46.8* 44.9 44.1 42.7  MCV 93.8 94.1 96.1 95.1  PLT 227 207 174 235   Basic Metabolic Panel:  Recent Labs Lab 08/28/16 0950 08/28/16 1456 08/29/16 0501 08/30/16 0321  NA 138  --  135 135  K 3.7  --  3.6 4.0  CL 105  --  102 102  CO2 22  --  22 26  GLUCOSE 94  --  50* 78  BUN 11  --  13 12  CREATININE 0.78 0.80 0.96 0.84  CALCIUM 9.5  --  9.2 9.1  MG  --   --   --  1.7   GFR: Estimated Creatinine Clearance: 63.4 mL/min (by C-G formula based on SCr of 0.84 mg/dL). Liver Function Tests:  Recent Labs Lab 08/28/16 1007 08/29/16 0501  AST 64* 40  ALT 42 32  ALKPHOS 60 49  BILITOT 0.9 1.0  PROT 7.2 6.1*  ALBUMIN 3.8 3.3*    Recent Labs Lab 08/28/16 1007 08/29/16 0501  LIPASE 21 16   No results for input(s): AMMONIA in the last 168 hours. Coagulation Profile: No results for input(s): INR, PROTIME in the last 168 hours. Cardiac Enzymes: No results for input(s): CKTOTAL, CKMB, CKMBINDEX, TROPONINI in the last 168 hours. BNP (last 3 results) No results for input(s): PROBNP in the last 8760 hours. HbA1C: No results for input(s): HGBA1C in the last 72 hours. CBG:  Recent Labs Lab 08/29/16 0852 08/29/16 0923 08/29/16 1117 08/29/16 1648 08/29/16 2256  GLUCAP 37* 63* 156* 127* 71   Lipid Profile: No results for input(s): CHOL, HDL, LDLCALC, TRIG, CHOLHDL, LDLDIRECT in the last 72 hours. Thyroid Function Tests: No results for input(s): TSH, T4TOTAL, FREET4, T3FREE, THYROIDAB in the last 72 hours. Anemia Panel: No results for input(s): VITAMINB12, FOLATE, FERRITIN, TIBC, IRON, RETICCTPCT in the last 72 hours. Urine analysis:    Component Value Date/Time   COLORURINE YELLOW 08/28/2016 1242   APPEARANCEUR CLEAR 08/28/2016 1242   LABSPEC 1.041 (H) 08/28/2016 1242   PHURINE 5.0 08/28/2016 1242   GLUCOSEU NEGATIVE  08/28/2016 1242   HGBUR NEGATIVE 08/28/2016 1242   HGBUR negative 03/16/2010 1107   BILIRUBINUR NEGATIVE 08/28/2016 1242   KETONESUR NEGATIVE 08/28/2016 1242   PROTEINUR NEGATIVE 08/28/2016 1242   UROBILINOGEN 1.0 10/12/2015 1910   NITRITE NEGATIVE 08/28/2016 1242   LEUKOCYTESUR NEGATIVE 08/28/2016 1242   Sepsis Labs: @LABRCNTIP (procalcitonin:4,lacticidven:4)  )No results found for this or any previous visit (from the past 240 hour(s)).       Radiology Studies: Ct Abdomen Pelvis W Contrast  Result Date: 08/28/2016 CLINICAL DATA:  Diffuse abdominal pain EXAM: CT ABDOMEN  AND PELVIS WITH CONTRAST TECHNIQUE: Multidetector CT imaging of the abdomen and pelvis was performed using the standard protocol following bolus administration of intravenous contrast. CONTRAST:  181mL ISOVUE-300 IOPAMIDOL (ISOVUE-300) INJECTION 61% COMPARISON:  02/18/2015 FINDINGS: Lower chest: Linear atelectasis in the left base.  No effusions. Hepatobiliary: Suspect mild diffuse fatty infiltration of the liver. No focal abnormality. Gallbladder unremarkable. Pancreas: Diffuse pancreatic calcifications compatible with chronic calcific pancreatitis. No evidence of acute pancreatitis. Spleen: No focal abnormality.  Normal size. Adrenals/Urinary Tract: No adrenal abnormality. No focal renal abnormality. No stones or hydronephrosis. Urinary bladder is unremarkable. Stomach/Bowel: Normal appendix. Distal small bowel loops are decompressed. Proximal and mid small bowel loops are mildly dilated. Findings concerning for early partial small bowel obstruction. Exact transition not visualized. Large bowel unremarkable. Stomach unremarkable. Vascular/Lymphatic: Aortic and iliac calcifications. No aneurysm or adenopathy. Reproductive: Numerous uterine fibroids.  No adnexal masses. Other: No free fluid or free air. Musculoskeletal: No acute bony abnormality. IMPRESSION: Mildly dilated proximal and mid small bowel loops with decompressed  distal small bowel. Findings concerning for early/ low-grade partial small bowel obstruction. Changes of chronic pancreatitis with calcifications throughout the pancreas. Fibroid uterus. Normal appendix. Suspect mild fatty infiltration of the liver. Electronically Signed   By: Rolm Baptise M.D.   On: 08/28/2016 11:21   Dg Abd Acute W/chest  Result Date: 08/29/2016 CLINICAL DATA:  Follow-up possible early low grade partial small bowel obstruction identified on CT yesterday. Generalized abdominal pain. Shortness of breath. EXAM: DG ABDOMEN ACUTE W/ 1V CHEST COMPARISON:  CT abdomen and pelvis 08/28/2016, 02/18/2015. Acute abdomen series 02/18/2015. FINDINGS: Bowel gas pattern unremarkable without evidence of obstruction or significant ileus. Expected colonic stool burden. Calcifications involving the entire pancreas as noted on CT. No visible urinary tract calculi. Regional skeleton unremarkable. Aortoiliac atherosclerosis as noted on CT. Prior sternotomy for CABG. Cardiac silhouette mildly enlarged, unchanged. Areas of linear scarring at the left lung base, the right mid lung, and both upper lobes, unchanged. No new pulmonary parenchymal abnormalities. IMPRESSION: 1. No acute abdominal abnormality. Specifically, no evidence of bowel obstruction as was questioned on yesterday's CT. 2. Stable mild cardiomegaly. No acute cardiopulmonary disease. Stable scattered areas of scarring in both lungs. Electronically Signed   By: Evangeline Dakin M.D.   On: 08/29/2016 17:45        Scheduled Meds: . aspirin  325 mg Oral Daily  . clopidogrel  75 mg Oral Q breakfast  . folic acid  1 mg Oral Daily  . heparin  5,000 Units Subcutaneous Q8H  . hydrochlorothiazide  12.5 mg Oral q morning - 10a  . insulin aspart  0-9 Units Subcutaneous TID WC  . losartan  50 mg Oral Daily  . metoprolol tartrate  100 mg Oral Daily  . multivitamin with minerals  1 tablet Oral Daily  . pantoprazole (PROTONIX) IV  40 mg Intravenous Q24H   . pravastatin  20 mg Oral q1800  . thiamine  100 mg Oral Daily   Or  . thiamine  100 mg Intravenous Daily   Continuous Infusions: . sodium chloride 75 mL/hr at 08/30/16 0341  . dextrose 5 % and 0.45% NaCl Stopped (08/29/16 1233)  . methocarbamol (ROBAXIN)  IV       LOS: 1 day    Time spe 35 minutes    Verneita Griffes, MD [facility]   If 7PM-7AM, please contact night-coverage [email or web address] Password [email or web address] 08/30/2016, 7:22 AM

## 2016-08-30 NOTE — Progress Notes (Signed)
[facility] Progress Note     Subjective: CC: abdominal pain Patient states abdominal pain is unchanged from admission. Describes as sharp constant generalized pain. Patient having nausea but no vomiting. States she has not been eating, but per RN note may have had some french fries last night. No flatus.  UOP good. VSS.   Objective: Vital signs in last 24 hours: Temp:  [97.4 F (36.3 C)-98.4 F (36.9 C)] 98.4 F (36.9 C) (06/07 1131) Pulse Rate:  [60-74] 71 (06/07 1131) Resp:  [17] 17 (06/07 0610) BP: (117-146)/(64-87) 137/76 (06/07 1131) SpO2:  [92 %-99 %] 97 % (06/07 1131) Weight:  [52.4 kg (115 lb 8 oz)] 52.4 kg (115 lb 8 oz) (06/07 0610) Last BM Date: 08/28/16  Intake/Output from previous day: 06/06 0701 - 06/07 0700 In: 1678.8 [P.O.:320; I.V.:1358.8] Out: 800 [Urine:800] Intake/Output this shift: Total I/O In: 200 [P.O.:200] Out: 400 [Urine:400]  PE: Gen:  Alert, NAD, pleasant Card:  Regular rate and rhythm, no M/G/R Pulm:  Normal effort, clear to auscultation bilaterally Abd: Soft, generalized mild TTP, non-distended, bowel sounds present in all 4 quadrants. No guarding or rebound tenderness.  Skin: warm and dry, no rashes  Psych: A&Ox3   Lab Results:   Recent Labs  08/29/16 0501 08/30/16 0321  WBC 6.0 4.7  HGB 14.1 13.9  HCT 44.1 42.7  PLT 174 175   BMET  Recent Labs  08/29/16 0501 08/30/16 0321  NA 135 135  K 3.6 4.0  CL 102 102  CO2 22 26  GLUCOSE 50* 78  BUN 13 12  CREATININE 0.96 0.84  CALCIUM 9.2 9.1   CMP     Component Value Date/Time   NA 135 08/30/2016 0321   K 4.0 08/30/2016 0321   CL 102 08/30/2016 0321   CO2 26 08/30/2016 0321   GLUCOSE 78 08/30/2016 0321   BUN 12 08/30/2016 0321   CREATININE 0.84 08/30/2016 0321   CALCIUM 9.1 08/30/2016 0321   PROT 6.1 (L) 08/29/2016 0501   ALBUMIN 3.3 (L) 08/29/2016 0501   AST 40 08/29/2016 0501   ALT 32 08/29/2016 0501   ALKPHOS 49 08/29/2016 0501   BILITOT 1.0  08/29/2016 0501   GFRNONAA >60 08/30/2016 0321   GFRAA >60 08/30/2016 0321   Lipase     Component Value Date/Time   LIPASE 16 08/29/2016 0501    Studies/Results: Dg Abd Acute W/chest  Result Date: 08/29/2016 CLINICAL DATA:  Follow-up possible early low grade partial small bowel obstruction identified on CT yesterday. Generalized abdominal pain. Shortness of breath. EXAM: DG ABDOMEN ACUTE W/ 1V CHEST COMPARISON:  CT abdomen and pelvis 08/28/2016, 02/18/2015. Acute abdomen series 02/18/2015. FINDINGS: Bowel gas pattern unremarkable without evidence of obstruction or significant ileus. Expected colonic stool burden. Calcifications involving the entire pancreas as noted on CT. No visible urinary tract calculi. Regional skeleton unremarkable. Aortoiliac atherosclerosis as noted on CT. Prior sternotomy for CABG. Cardiac silhouette mildly enlarged, unchanged. Areas of linear scarring at the left lung base, the right mid lung, and both upper lobes, unchanged. No new pulmonary parenchymal abnormalities. IMPRESSION: 1. No acute abdominal abnormality. Specifically, no evidence of bowel obstruction as was questioned on yesterday's CT. 2. Stable mild cardiomegaly. No acute cardiopulmonary disease. Stable scattered areas of scarring in both lungs. Electronically Signed   By: Evangeline Dakin M.D.   On: 08/29/2016 17:45     Assessment/Plan SBO vs Ileus - CT scan 6/5 showed mildly dilated proximal and mid small bowel loops with decompressed distal  small bowel concerning for early/ low-grade partial SBO, no transition point - Abdominal film yesterday PM showed no evidence of bowel obstruction, with stool in colon - WBC 4.7 - patient may have sips of clears - recommend trying some enemas   Alcohol abuse Chronic pancreatitis HTN HLD OSA on CPAP DM HLD CHF Sarcoidosis CAD s/p CABG  ID - none VTE - SCDs, heparin FEN - IVF, sips of clears  Plan - enemas to try and help with stool burden. Give  patients sips of clears. Will discuss with MD and continue to follow.   LOS: 1 day    Brigid Re , [facility] Surgery 08/30/2016, 12:26 PM Pager: [phone] Consults: [phone] Mon-Fri 7:00 am-4:30 pm Sat-Sun 7:00 am-11:30 am

## 2016-08-30 NOTE — Progress Notes (Signed)
Patient's insulin has been held in the last 24 hours due to noncompliance x1 with bowel rest, and previous hypoglycemic episode yesterday morning. (Dr Hartford Poli verified plan yesterday afternoon).  Pt's diet order changed to full liquid now per Dr Verlon Au, should be able to tolerate ACHS coverage with new order.

## 2016-08-30 NOTE — Progress Notes (Signed)
Paged MD about increase in pain after patient got up to the bathroom. MD orders followed/ Patient also complained of diarrhea that looked bloody- will continue to monitor.

## 2016-08-31 ENCOUNTER — Inpatient Hospital Stay (HOSPITAL_COMMUNITY): Payer: 59

## 2016-08-31 LAB — GLUCOSE, CAPILLARY
GLUCOSE-CAPILLARY: 86 mg/dL (ref 65–99)
GLUCOSE-CAPILLARY: 131 mg/dL — AB (ref 65–99)
Glucose-Capillary: 164 mg/dL — ABNORMAL HIGH (ref 65–99)
Glucose-Capillary: 178 mg/dL — ABNORMAL HIGH (ref 65–99)

## 2016-08-31 MED ORDER — HYDROCODONE-ACETAMINOPHEN 5-325 MG PO TABS
1.0000 | ORAL_TABLET | ORAL | Status: DC | PRN
  Administered 2016-08-31 – 2016-09-01 (×6): 2 via ORAL
  Filled 2016-08-31 (×6): qty 2

## 2016-08-31 MED ORDER — PANTOPRAZOLE SODIUM 40 MG PO TBEC
40.0000 mg | DELAYED_RELEASE_TABLET | Freq: Every day | ORAL | Status: DC
  Administered 2016-08-31: 40 mg via ORAL
  Filled 2016-08-31: qty 1

## 2016-08-31 MED ORDER — FENTANYL CITRATE (PF) 100 MCG/2ML IJ SOLN: 25.0000 ug | INTRAMUSCULAR | Status: DC | PRN

## 2016-08-31 MED ORDER — FENTANYL CITRATE (PF) 100 MCG/2ML IJ SOLN
25.0000 ug | INTRAMUSCULAR | Status: DC | PRN
Start: 2016-08-31 — End: 2016-09-01
  Administered 2016-08-31: 25 ug via INTRAVENOUS
  Filled 2016-08-31: qty 2

## 2016-08-31 MED ORDER — CAPSAICIN 0.025 % EX CREA
TOPICAL_CREAM | Freq: Two times a day (BID) | CUTANEOUS | Status: DC
  Administered 2016-08-31 – 2016-09-01 (×2): via TOPICAL
  Filled 2016-08-31 (×3): qty 60

## 2016-08-31 NOTE — Progress Notes (Addendum)
PROGRESS NOTE    Marie [name]  IFO:[id number] DOB: 1965/06/27 DOA: 08/28/2016 PCP: Gaynelle Arabian, MD  Outpatient Specialists:     Brief Narrative:  33 ? Prior ETOH Chr Pancreatitis   Admitted 07/2012, 07/2013 for similar event DM TY2 complications of neuropathy, nephropathy Reflux CAD at age 51 on aspirin and Plavix  OSA not on CPAP Current daily smoker half-three quarter PPD Sarcoidosis diagnosed 2007-node biopsy Dr. Arlyce Dice  HTN HLD   admit 08/29/2016 early SBO on CT scan  LFTs normal GFR >60 CBC normal Abdominal x-ray repeated 08/29/2016 p.m. showed resolution of SBO General surgery consult  Later 6/6 found to be in early withdrawal and CIWA started with Ativan       Assessment & Plan:   Active Problems:   Alcohol abuse, continuous   Upper abdominal pain   COPD (chronic obstructive pulmonary disease) (HCC)   SBO (small bowel obstruction) (HCC)   History of coronary artery bypass graft   COPD with chronic bronchitis (HCC)   Essential hypertension   Chronic pain   ETOH abuse   Malnutrition of moderate degree   Early SBO/ileus Bilat Tubal ligation as a younger lady No significant emesis at this time so likely no benefit of NG tube at this time. tol clears-some Nausea Had enema with only small effect AXr =?ileus Await resoltuion-AXR in am  Hypoglycemia hypoglycemia with blood glucose of 37, patient is diabetic but did not get any insulin 6/6 am Started on D5/half-normal saline at 75 keep her in the high side rather than hypoglycemic. cbg 86--164  ETOH abuse: drinks 4 glasses of wine at baseline. Patient drank an entire polyp wine the night prior to admission. EtOH level 18. Patient somewhat tremulous and appears to be in withdrawal Ativan 2 mg IV 1 now CIWA d/c 6/7 as no obj indication  Abdominal pain:  Chronic pain.  change PO flexeril to IV Robaxin Likely multifactorial including EtOH withdrawal, early SBO, and viral gastroenteritis.  Doubt pancreatitis though may be an early stages. Added Creon--non compliant at home Treatment as above Zofran, Toradol, fentanyl when necessary Change to PO toradol Had to add back PO Norco 6/8 as patient saying no pain relief--watch for SBO  HTN:  Continue Losartan, Lopressor, HCTZ  CAD/CABG:   continue ASA, Plavix, Statin  OSA/CPAP/COPD: continue prn albuterol and CPAP QHS  GERD: change to IV protonix      DVT prophylaxis: (Lovenox/Heparin/SCD's/anticoagulated/None (if comfort care) Code Status: (Full/Partial - specify details) Family Communication: (Specify name, relationship & date discussed. NO "discussed with patient") Disposition Plan: (specify when and where you expect patient to be discharged)   Consultants:    Gen. surgery  Procedures:    CT scan 6/5  DG AX view 6/6  Antimicrobials:    none    Subjective:  In pain Wanted to leave [facility] earlier Talked to patient-explained rational Narcotics vs SBO risk benefit alternative-she understands risks of worsening her SBO  Objective: Vitals:   08/30/16 2157 08/31/16 0605 08/31/16 0835 08/31/16 1128  BP: 138/84 123/68 118/78 128/77  Pulse: 70 62 72 (!) 57  Resp: 17 17  18   Temp: 98.2 F (36.8 C) 98 F (36.7 C) 97.9 F (36.6 C) 98.4 F (36.9 C)  TempSrc: Oral Oral Oral Oral  SpO2: 96% 95% 96% 99%  Weight:  52.8 kg (116 lb 6.4 oz)    Height:        Intake/Output Summary (Last 24 hours) at 08/31/16 1707 Last data filed at 08/31/16 1336  Gross per 24 hour  Intake          1961.25 ml  Output             1300 ml  Net           661.25 ml   Filed Weights   08/29/16 0448 08/30/16 0610 08/31/16 0605  Weight: 51.5 kg (113 lb 9.6 oz) 52.4 kg (115 lb 8 oz) 52.8 kg (116 lb 6.4 oz)    Examination:  Alert pleasant oriented no apparent distress EOMI NCAT Chest clear Abdomen  slight tenderness epigastrium Patient nontoxic per  neurology logically intact   Data Reviewed: I have personally  reviewed following labs and imaging studies  CBC:  Recent Labs Lab 08/28/16 0950 08/28/16 1456 08/29/16 0501 08/30/16 0321  WBC 8.8 6.9 6.0 4.7  HGB 15.8* 15.0 14.1 13.9  HCT 46.8* 44.9 44.1 42.7  MCV 93.8 94.1 96.1 95.1  PLT 227 207 174 973   Basic Metabolic Panel:  Recent Labs Lab 08/28/16 0950 08/28/16 1456 08/29/16 0501 08/30/16 0321  NA 138  --  135 135  K 3.7  --  3.6 4.0  CL 105  --  102 102  CO2 22  --  22 26  GLUCOSE 94  --  50* 78  BUN 11  --  13 12  CREATININE 0.78 0.80 0.96 0.84  CALCIUM 9.5  --  9.2 9.1  MG  --   --   --  1.7   GFR: Estimated Creatinine Clearance: 63.4 mL/min (by C-G formula based on SCr of 0.84 mg/dL). Liver Function Tests:  Recent Labs Lab 08/28/16 1007 08/29/16 0501  AST 64* 40  ALT 42 32  ALKPHOS 60 49  BILITOT 0.9 1.0  PROT 7.2 6.1*  ALBUMIN 3.8 3.3*    Recent Labs Lab 08/28/16 1007 08/29/16 0501  LIPASE 21 16   No results for input(s): AMMONIA in the last 168 hours. Coagulation Profile: No results for input(s): INR, PROTIME in the last 168 hours. Cardiac Enzymes: No results for input(s): CKTOTAL, CKMB, CKMBINDEX, TROPONINI in the last 168 hours. BNP (last 3 results) No results for input(s): PROBNP in the last 8760 hours. HbA1C: No results for input(s): HGBA1C in the last 72 hours. CBG:  Recent Labs Lab 08/30/16 1625 08/30/16 2153 08/31/16 0739 08/31/16 1124 08/31/16 1608  GLUCAP 231* 76 86 131* 164*   Lipid Profile: No results for input(s): CHOL, HDL, LDLCALC, TRIG, CHOLHDL, LDLDIRECT in the last 72 hours. Thyroid Function Tests: No results for input(s): TSH, T4TOTAL, FREET4, T3FREE, THYROIDAB in the last 72 hours. Anemia Panel: No results for input(s): VITAMINB12, FOLATE, FERRITIN, TIBC, IRON, RETICCTPCT in the last 72 hours. Urine analysis:    Component Value Date/Time   COLORURINE YELLOW 08/28/2016 1242   APPEARANCEUR CLEAR 08/28/2016 1242   LABSPEC 1.041 (H) 08/28/2016 1242   PHURINE 5.0  08/28/2016 1242   GLUCOSEU NEGATIVE 08/28/2016 1242   HGBUR NEGATIVE 08/28/2016 1242   HGBUR negative 03/16/2010 1107   BILIRUBINUR NEGATIVE 08/28/2016 1242   KETONESUR NEGATIVE 08/28/2016 1242   PROTEINUR NEGATIVE 08/28/2016 1242   UROBILINOGEN 1.0 10/12/2015 1910   NITRITE NEGATIVE 08/28/2016 1242   LEUKOCYTESUR NEGATIVE 08/28/2016 1242   Sepsis Labs: @LABRCNTIP (procalcitonin:4,lacticidven:4)  )No results found for this or any previous visit (from the past 240 hour(s)).       Radiology Studies: Dg Abd 2 Views  Result Date: 08/31/2016 CLINICAL DATA:  Small bowel obstruction. EXAM: ABDOMEN - 2 VIEW COMPARISON:  08/29/2016 and  CT scan dated 08/28/2016 FINDINGS: There are slightly prominent small bowel loops in the left mid abdomen. No colon distention. No distal small bowel distention. Chronic calcific pancreatitis is again noted. No free air or free fluid. IMPRESSION: Slight distention of small bowel loops in the left mid abdomen as demonstrated on the prior CT scan. These slightly prominent bowel loops were not visible on the radiographs of 08/29/2016. This could represent a focal ileus or partial small bowel obstruction. Electronically Signed   By: Lorriane Shire M.D.   On: 08/31/2016 12:36   Dg Abd Acute W/chest  Result Date: 08/29/2016 CLINICAL DATA:  Follow-up possible early low grade partial small bowel obstruction identified on CT yesterday. Generalized abdominal pain. Shortness of breath. EXAM: DG ABDOMEN ACUTE W/ 1V CHEST COMPARISON:  CT abdomen and pelvis 08/28/2016, 02/18/2015. Acute abdomen series 02/18/2015. FINDINGS: Bowel gas pattern unremarkable without evidence of obstruction or significant ileus. Expected colonic stool burden. Calcifications involving the entire pancreas as noted on CT. No visible urinary tract calculi. Regional skeleton unremarkable. Aortoiliac atherosclerosis as noted on CT. Prior sternotomy for CABG. Cardiac silhouette mildly enlarged, unchanged. Areas  of linear scarring at the left lung base, the right mid lung, and both upper lobes, unchanged. No new pulmonary parenchymal abnormalities. IMPRESSION: 1. No acute abdominal abnormality. Specifically, no evidence of bowel obstruction as was questioned on yesterday's CT. 2. Stable mild cardiomegaly. No acute cardiopulmonary disease. Stable scattered areas of scarring in both lungs. Electronically Signed   By: Evangeline Dakin M.D.   On: 08/29/2016 17:45        Scheduled Meds: . aspirin  325 mg Oral Daily  . capsaicin   Topical BID  . clopidogrel  75 mg Oral Q breakfast  . folic acid  1 mg Oral Daily  . heparin  5,000 Units Subcutaneous Q8H  . hydrochlorothiazide  12.5 mg Oral q morning - 10a  . insulin aspart  0-9 Units Subcutaneous TID WC  . ketorolac  10 mg Oral Q6H  . lipase/protease/amylase  12,000 Units Oral TID WC  . losartan  50 mg Oral Daily  . metoprolol tartrate  100 mg Oral Daily  . multivitamin with minerals  1 tablet Oral Daily  . pantoprazole  40 mg Oral QHS  . polyethylene glycol  17 g Oral Daily  . pravastatin  20 mg Oral q1800  . thiamine  100 mg Oral Daily   Continuous Infusions: . sodium chloride 75 mL/hr at 08/31/16 0849  . dextrose 5 % and 0.45% NaCl Stopped (08/29/16 1233)  . methocarbamol (ROBAXIN)  IV       LOS: 2 days    Time spe 25 minutes    Verneita Griffes, MD [facility]   If 7PM-7AM, please contact night-coverage [email or web address] Password [email or web address] 08/31/2016, 5:07 PM

## 2016-08-31 NOTE — Progress Notes (Signed)
Pt states she wants to leave the hospital because of her pain now for 3 days. She states "if there is nothing that will make my pain go away, I'd rather leave." Dr. Verlon Au notified and updated of her condition. Per MD, he will see pt. No new orders at this time.

## 2016-08-31 NOTE — Progress Notes (Signed)
Dr. Verlon Au at bedside to discuss plan of care with pt. MD discussed pt's situation and answered pt's questions. Pt agreeable to adjustment to plan and medications.

## 2016-08-31 NOTE — Progress Notes (Signed)
May need to D/C continuous infusion- D5 0.45.

## 2016-08-31 NOTE — Progress Notes (Signed)
[facility] Progress Note    Subjective: Patient still having pain.  Objective: Vital signs in last 24 hours: Temp:  [97.9 F (36.6 C)-98.4 F (36.9 C)] 97.9 F (36.6 C) (06/08 0835) Pulse Rate:  [62-72] 72 (06/08 0835) Resp:  [16-17] 17 (06/08 0605) BP: (118-154)/(67-86) 118/78 (06/08 0835) SpO2:  [95 %-100 %] 96 % (06/08 0835) Weight:  [52.8 kg (116 lb 6.4 oz)] 52.8 kg (116 lb 6.4 oz) (06/08 0605) Last BM Date: 08/30/16  Intake/Output from previous day: 06/07 0701 - 06/08 0700 In: 2288.8 [P.O.:560; I.V.:1728.8] Out: 2200 [Urine:2200] Intake/Output this shift: Total I/O In: 0  Out: 300 [Urine:300]  General: Still with diffuse upper abdominal pain.  Lungs: Clear  Abd: flat, not distended, good bowel sounds.  No objective clinical findings to explain her abdominal pain.  Extremities: No changes  Neuro: Intact  Lab Results:  @LABLAST2 (wbc:2,hgb:2,hct:2,plt:2) BMET ) Recent Labs  08/29/16 0501 08/30/16 0321  NA 135 135  K 3.6 4.0  CL 102 102  CO2 22 26  GLUCOSE 50* 78  BUN 13 12  CREATININE 0.96 0.84  CALCIUM 9.2 9.1   PT/INR No results for input(s): LABPROT, INR in the last 72 hours. ABG No results for input(s): PHART, HCO3 in the last 72 hours.  Invalid input(s): PCO2, PO2  Studies/Results: Dg Abd Acute W/chest  Result Date: 08/29/2016 CLINICAL DATA:  Follow-up possible early low grade partial small bowel obstruction identified on CT yesterday. Generalized abdominal pain. Shortness of breath. EXAM: DG ABDOMEN ACUTE W/ 1V CHEST COMPARISON:  CT abdomen and pelvis 08/28/2016, 02/18/2015. Acute abdomen series 02/18/2015. FINDINGS: Bowel gas pattern unremarkable without evidence of obstruction or significant ileus. Expected colonic stool burden. Calcifications involving the entire pancreas as noted on CT. No visible urinary tract calculi. Regional skeleton unremarkable. Aortoiliac atherosclerosis as noted on CT. Prior sternotomy for CABG. Cardiac silhouette  mildly enlarged, unchanged. Areas of linear scarring at the left lung base, the right mid lung, and both upper lobes, unchanged. No new pulmonary parenchymal abnormalities. IMPRESSION: 1. No acute abdominal abnormality. Specifically, no evidence of bowel obstruction as was questioned on yesterday's CT. 2. Stable mild cardiomegaly. No acute cardiopulmonary disease. Stable scattered areas of scarring in both lungs. Electronically Signed   By: Evangeline Dakin M.D.   On: 08/29/2016 17:45    Anti-infectives: Anti-infectives    None      Assessment/Plan: s/p  Advance diet  LOS: 2 days   Kathryne Eriksson. Dahlia Bailiff, MD, FACS [phone] [facility] Surgery 08/31/2016

## 2016-08-31 NOTE — Progress Notes (Signed)
Patient complaining of severe pain, verbal order for fentanyl.

## 2016-09-01 LAB — GLUCOSE, CAPILLARY
GLUCOSE-CAPILLARY: 131 mg/dL — AB (ref 65–99)
Glucose-Capillary: 92 mg/dL (ref 65–99)

## 2016-09-01 MED ORDER — PANCRELIPASE (LIP-PROT-AMYL) 12000-38000 UNITS PO CPEP: 12000.0000 [IU] | ORAL_CAPSULE | Freq: Three times a day (TID) | ORAL | 3 refills | Status: DC

## 2016-09-01 NOTE — Discharge Summary (Signed)
Pt got discharged, discharge instructions provided and patient showed understanding to it, IV taken out,Telemonitor DC,pt left unit in wheelchair with all of the belongings. 

## 2016-09-01 NOTE — Discharge Summary (Signed)
Physician Discharge Summary  Marie [name] DVV:[id number] DOB: 27-Sep-1965 DOA: 08/28/2016  PCP: Gaynelle Arabian, MD  Admit date: 08/28/2016 Discharge date: 09/01/2016  Time spent: 35 minutes  Recommendations for Outpatient Follow-up:  1. contoinue CREon as OP-new med 2. Soft diet 3-5 days 3. Needs Labs 1-2 weeks as OP 4. ? AUDIT or CAGE as OP.  Unlikely she will quit though  Discharge Diagnoses:  Active Problems:   Alcohol abuse, continuous   Upper abdominal pain   COPD (chronic obstructive pulmonary disease) (HCC)   SBO (small bowel obstruction) (HCC)   History of coronary artery bypass graft   COPD with chronic bronchitis (HCC)   Essential hypertension   Chronic pain   ETOH abuse   Malnutrition of moderate degree   Discharge Condition: good  Diet recommendation: soft  Filed Weights   08/30/16 0610 08/31/16 0605 09/01/16 0603  Weight: 52.4 kg (115 lb 8 oz) 52.8 kg (116 lb 6.4 oz) 54.1 kg (119 lb 4.8 oz)    History of present illness:  51 ? Prior ETOH Chr Pancreatitis              Admitted 07/2012, 07/2013 for similar event DM TY2 complications of neuropathy, nephropathy Reflux CAD at age 36 on aspirin and Plavix  OSA not on CPAP Current daily smoker half-three quarter PPD Sarcoidosis diagnosed 2007-node biopsy Dr. Arlyce Dice  HTN HLD   admit 08/29/2016 early SBO on CT scan  LFTs normal GFR >60 CBC normal Abdominal x-ray repeated 08/29/2016 p.m. showed resolution of SBO General surgery consult  Later 6/6 found to be in early withdrawal and CIWA started with Ativan  Hospital Course:  Early SBO/ileus Bilat Tubal ligation as a younger lady No significant emesis at this time so likely no benefit of NG tube at this time. tol clears-some Nausea Had enema with only small effect AXr =?ileus Await resoltuion-AXR in am  Hypoglycemia hypoglycemia with blood glucose of 37, patient is diabetic but did not get any insulin 6/6 am Started on D5/half-normal saline at  75 keep her in the high side rather than hypoglycemic. cbg 86--164  ETOH abuse: drinks 4 glasses of wine at baseline. Patient drank an entire polyp wine the night prior to admission. EtOH level 18. Patient somewhat tremulous and appears to be in withdrawal Ativan 2 mg IV 1 now CIWA d/c 6/7 as no obj indication  Abdominal pain:  Chronic pain.  Likely multifactorial including EtOH withdrawal, early SBO, and viral gastroenteritis.  Doubt pancreatitis though may be an early stages. Added Creon--non compliant at home Treatment as above Zofran, Toradol, fentanyl when necessary Had to add back PO Norco 6/8 as patient saying no pain relief MUCH improved 6/9--eatig full meals Ready for d/c  HTN:  Continue Losartan, Lopressor, HCTZ  CAD/CABG:   continue ASA, Plavix, Statin  OSA/CPAP/COPD: continue prn albuterol and CPAP QHS  GERD: Resume PPI on d/c    Procedures:  scans   Consultations:  gen surg  Discharge Exam: Vitals:   08/31/16 2128 09/01/16 0603  BP: 101/69 115/67  Pulse: 62 (!) 57  Resp: 16   Temp: 98.7 F (37.1 C) 97.8 F (36.6 C)   Facies imporived-looks brighter and happier Eating eggs, juice and coffe General: eomi ncat Cardiovascular: s1  s2no m/r/g Respiratory: slight tender in epig  Discharge Instructions   Discharge Instructions    AMB Referral to [place] Management    Complete by:  As directed    Please assign UMR member for post discharge  call. Please assess whether interested in LTW  for DM management during post discharge call. Please see notes. Currently at [facility]. Packet provided. Thanks.Marthenia Rolling, [place], RN,BSN-THN [facility] [phone]   Reason for consult:  Please assign UMR member for post discharge   Diagnoses of:  Diabetes   Expected date of contact:  1-3 days (reserved for hospital discharges)   Diet - low sodium heart healthy    Complete by:  As directed    Discharge instructions     Complete by:  As directed    eart a soft diet for a couple of days eggs , mashed potatoes etc Follow up with regular MD Fill the Creon RX Good luck and have a happy summer   Increase activity slowly    Complete by:  As directed      Current Discharge Medication List    START taking these medications   Details  lipase/protease/amylase (CREON) 12000 units CPEP capsule Take 1 capsule (12,000 Units total) by mouth 3 (three) times daily with meals. Qty: 270 capsule, Refills: 3      CONTINUE these medications which have NOT CHANGED   Details  aspirin 325 MG tablet Take 325 mg by mouth daily as needed for mild pain.     clopidogrel (PLAVIX) 75 MG tablet Take 75 mg by mouth daily with breakfast.     hydrochlorothiazide (HYDRODIURIL) 25 MG tablet Take 25 mg by mouth daily.     HYDROcodone-acetaminophen (NORCO/VICODIN) 5-325 MG tablet Take 1 tablet by mouth every 6 (six) hours as needed for moderate pain.    ibuprofen (ADVIL,MOTRIN) 200 MG tablet Take 400-600 mg by mouth every 6 (six) hours as needed for moderate pain.    losartan (COZAAR) 50 MG tablet Take 50 mg by mouth daily. Refills: 3    metFORMIN (GLUCOPHAGE) 500 MG tablet Take 500 mg by mouth daily with breakfast. Take with food    metoprolol (LOPRESSOR) 100 MG tablet Take 100 mg by mouth daily.  Refills: 0    pantoprazole (PROTONIX) 40 MG tablet Take 1 tablet (40 mg total) by mouth daily. Qty: 20 tablet, Refills: 0    pravastatin (PRAVACHOL) 20 MG tablet Take 20 mg by mouth daily.     albuterol (PROVENTIL HFA;VENTOLIN HFA) 108 (90 BASE) MCG/ACT inhaler Inhale 2 puffs into the lungs every 6 (six) hours as needed for wheezing or shortness of breath. Qty: 1 Inhaler, Refills: 6    cyclobenzaprine (FLEXERIL) 5 MG tablet 1 pill by mouth up to every 8 hours as needed. Start with one pill by mouth each bedtime as needed due to sedation Qty: 15 tablet, Refills: 0   Associated Diagnoses: Bilateral low back pain with sciatica,  sciatica laterality unspecified; Contusion, back, unspecified laterality, initial encounter    dicyclomine (BENTYL) 20 MG tablet Take 1 tablet (20 mg total) by mouth 2 (two) times daily. Qty: 20 tablet, Refills: 0    ondansetron (ZOFRAN ODT) 4 MG disintegrating tablet Take 1 tablet (4 mg total) by mouth every 8 (eight) hours as needed. Qty: 40 tablet, Refills: 0       Allergies  Allergen Reactions  . Lisinopril Hives      The results of significant diagnostics from this hospitalization (including imaging, microbiology, ancillary and laboratory) are listed below for reference.    Significant Diagnostic Studies: Ct Abdomen Pelvis W Contrast  Result Date: 08/28/2016 CLINICAL DATA:  Diffuse abdominal pain EXAM: CT ABDOMEN AND PELVIS WITH CONTRAST TECHNIQUE: Multidetector CT imaging of the  abdomen and pelvis was performed using the standard protocol following bolus administration of intravenous contrast. CONTRAST:  146mL ISOVUE-300 IOPAMIDOL (ISOVUE-300) INJECTION 61% COMPARISON:  02/18/2015 FINDINGS: Lower chest: Linear atelectasis in the left base.  No effusions. Hepatobiliary: Suspect mild diffuse fatty infiltration of the liver. No focal abnormality. Gallbladder unremarkable. Pancreas: Diffuse pancreatic calcifications compatible with chronic calcific pancreatitis. No evidence of acute pancreatitis. Spleen: No focal abnormality.  Normal size. Adrenals/Urinary Tract: No adrenal abnormality. No focal renal abnormality. No stones or hydronephrosis. Urinary bladder is unremarkable. Stomach/Bowel: Normal appendix. Distal small bowel loops are decompressed. Proximal and mid small bowel loops are mildly dilated. Findings concerning for early partial small bowel obstruction. Exact transition not visualized. Large bowel unremarkable. Stomach unremarkable. Vascular/Lymphatic: Aortic and iliac calcifications. No aneurysm or adenopathy. Reproductive: Numerous uterine fibroids.  No adnexal masses. Other: No  free fluid or free air. Musculoskeletal: No acute bony abnormality. IMPRESSION: Mildly dilated proximal and mid small bowel loops with decompressed distal small bowel. Findings concerning for early/ low-grade partial small bowel obstruction. Changes of chronic pancreatitis with calcifications throughout the pancreas. Fibroid uterus. Normal appendix. Suspect mild fatty infiltration of the liver. Electronically Signed   By: Rolm Baptise M.D.   On: 08/28/2016 11:21   Dg Abd 2 Views  Result Date: 08/31/2016 CLINICAL DATA:  Small bowel obstruction. EXAM: ABDOMEN - 2 VIEW COMPARISON:  08/29/2016 and CT scan dated 08/28/2016 FINDINGS: There are slightly prominent small bowel loops in the left mid abdomen. No colon distention. No distal small bowel distention. Chronic calcific pancreatitis is again noted. No free air or free fluid. IMPRESSION: Slight distention of small bowel loops in the left mid abdomen as demonstrated on the prior CT scan. These slightly prominent bowel loops were not visible on the radiographs of 08/29/2016. This could represent a focal ileus or partial small bowel obstruction. Electronically Signed   By: Lorriane Shire M.D.   On: 08/31/2016 12:36   Dg Abd Acute W/chest  Result Date: 08/29/2016 CLINICAL DATA:  Follow-up possible early low grade partial small bowel obstruction identified on CT yesterday. Generalized abdominal pain. Shortness of breath. EXAM: DG ABDOMEN ACUTE W/ 1V CHEST COMPARISON:  CT abdomen and pelvis 08/28/2016, 02/18/2015. Acute abdomen series 02/18/2015. FINDINGS: Bowel gas pattern unremarkable without evidence of obstruction or significant ileus. Expected colonic stool burden. Calcifications involving the entire pancreas as noted on CT. No visible urinary tract calculi. Regional skeleton unremarkable. Aortoiliac atherosclerosis as noted on CT. Prior sternotomy for CABG. Cardiac silhouette mildly enlarged, unchanged. Areas of linear scarring at the left lung base, the right  mid lung, and both upper lobes, unchanged. No new pulmonary parenchymal abnormalities. IMPRESSION: 1. No acute abdominal abnormality. Specifically, no evidence of bowel obstruction as was questioned on yesterday's CT. 2. Stable mild cardiomegaly. No acute cardiopulmonary disease. Stable scattered areas of scarring in both lungs. Electronically Signed   By: Evangeline Dakin M.D.   On: 08/29/2016 17:45    Microbiology: No results found for this or any previous visit (from the past 240 hour(s)).   Labs: Basic Metabolic Panel:  Recent Labs Lab 08/28/16 0950 08/28/16 1456 08/29/16 0501 08/30/16 0321  NA 138  --  135 135  K 3.7  --  3.6 4.0  CL 105  --  102 102  CO2 22  --  22 26  GLUCOSE 94  --  50* 78  BUN 11  --  13 12  CREATININE 0.78 0.80 0.96 0.84  CALCIUM 9.5  --  9.2 9.1  MG  --   --   --  1.7   Liver Function Tests:  Recent Labs Lab 08/28/16 1007 08/29/16 0501  AST 64* 40  ALT 42 32  ALKPHOS 60 49  BILITOT 0.9 1.0  PROT 7.2 6.1*  ALBUMIN 3.8 3.3*    Recent Labs Lab 08/28/16 1007 08/29/16 0501  LIPASE 21 16   No results for input(s): AMMONIA in the last 168 hours. CBC:  Recent Labs Lab 08/28/16 0950 08/28/16 1456 08/29/16 0501 08/30/16 0321  WBC 8.8 6.9 6.0 4.7  HGB 15.8* 15.0 14.1 13.9  HCT 46.8* 44.9 44.1 42.7  MCV 93.8 94.1 96.1 95.1  PLT 227 207 174 175   Cardiac Enzymes: No results for input(s): CKTOTAL, CKMB, CKMBINDEX, TROPONINI in the last 168 hours. BNP: BNP (last 3 results) No results for input(s): BNP in the last 8760 hours.  ProBNP (last 3 results) No results for input(s): PROBNP in the last 8760 hours.  CBG:  Recent Labs Lab 08/31/16 0739 08/31/16 1124 08/31/16 1608 08/31/16 2128 09/01/16 0732  GLUCAP 86 131* 164* 178* 92       Signed:  Nita Sells MD   [facility] 09/01/2016, 9:06 AM

## 2016-09-03 ENCOUNTER — Other Ambulatory Visit: Payer: Self-pay | Admitting: *Deleted

## 2016-09-03 DIAGNOSIS — Z7984 Long term (current) use of oral hypoglycemic drugs: Secondary | ICD-10-CM | POA: Diagnosis not present

## 2016-09-03 DIAGNOSIS — F1020 Alcohol dependence, uncomplicated: Secondary | ICD-10-CM | POA: Diagnosis not present

## 2016-09-03 DIAGNOSIS — R809 Proteinuria, unspecified: Secondary | ICD-10-CM | POA: Diagnosis not present

## 2016-09-03 DIAGNOSIS — F1190 Opioid use, unspecified, uncomplicated: Secondary | ICD-10-CM | POA: Diagnosis not present

## 2016-09-03 DIAGNOSIS — I119 Hypertensive heart disease without heart failure: Secondary | ICD-10-CM | POA: Diagnosis not present

## 2016-09-03 DIAGNOSIS — I2510 Atherosclerotic heart disease of native coronary artery without angina pectoris: Secondary | ICD-10-CM | POA: Diagnosis not present

## 2016-09-03 DIAGNOSIS — F17200 Nicotine dependence, unspecified, uncomplicated: Secondary | ICD-10-CM | POA: Diagnosis not present

## 2016-09-03 DIAGNOSIS — E1129 Type 2 diabetes mellitus with other diabetic kidney complication: Secondary | ICD-10-CM | POA: Diagnosis not present

## 2016-09-03 DIAGNOSIS — K56609 Unspecified intestinal obstruction, unspecified as to partial versus complete obstruction: Secondary | ICD-10-CM | POA: Diagnosis not present

## 2016-09-03 NOTE — Patient Outreach (Addendum)
[facility]) Care Management  09/03/2016  Marie [name] 1965/11/30 [id number]  Subjective: Telephone call to patient's home / mobile number, spoke with patient, and HIPAA verified.  Discussed [facility] Care Management UMR Transition of care follow up, patient voiced understanding, and is in agreement to follow up.  States her [employer] is primary and her husband [employer] is secondary.  Patient states she is doing well, just left the MD's office, and the follow up appointment went good.  States she told MD that she could not afford Creon medication (will cost over $100 per month), will do without for now, MD did not offer any alterative medication, medication does not come in generic, she does not have a [place] card / Flexible spending account, and does not have hospital indemnity supplemental insurance to assistance with medication cost.   RNCM advised she will discuss her situation with [place] Management pharmacist to see if there other available community resources for medication assistance.  RNCM advised patient will contact her if any medication assistance identified, patient voiced understanding, and states she does not need call back  if no assistance identified. Patient states she has family medical leave act (FMLA) in place with her employer.  States she is interested in the diabetes Link to Wellness / [employer], will go on line to sign, and will contact this RNCM if any additional assistance needed.  States she does not have COPD or need any education.  Patient states she does not have any transition of care, care coordination, or transportation needs at this time. States she is very appreciative of the follow up and is in agreement to receive [place] Management information.     Objective: Per chart review, patient hospitalized 08/28/16 - 09/01/16 for small bowel obstruction (SBO) and alcohol abuse.  Patient also has a history of diabetes,  CAD, hypertension,  malnutrition, and OSA (wears CPAP).    Assessment: Received UMR Transition of care referral on 09/03/16.  Transition of care follow up completed, no care management needs, and will proceed with case closure.   Plan: RNCM will send patient successful outreach letter, [facility] pamphlet, and magnet. RNCM will send case closure due to follow up completed / no care management needs request to [facility]. RNCM will follow up with [place] Management Pharmacist regarding Creon medication assistance within 3 business days.   Vong Garringer H. Annia Friendly, BSN, [place] Management [facility] Telephonic CM Phone: [phone] Fax: [phone]

## 2016-09-04 ENCOUNTER — Encounter: Payer: Self-pay | Admitting: *Deleted

## 2016-09-04 ENCOUNTER — Other Ambulatory Visit: Payer: Self-pay | Admitting: *Deleted

## 2016-09-04 NOTE — Patient Outreach (Addendum)
[facility]) Care Management  09/04/2016  KARIMAH WINQUIST 06/07/1965 [id number]   Subjective: Patient's medication assistance need discussed with Deanne Coffer, [place] Assistant Director.  RNCM advised of possible medication assistance program for patient's with commercial insurance ( https://www.creon.com/register) and to share with patient. Telephone call to patient's home  / mobile number, no answer, left HIPAA compliant voicemail message, and requested call back.   Objective: Per chart review, patient hospitalized 08/28/16 - 09/01/16 for small bowel obstruction (SBO) and alcohol abuse.  Patient also has a history of diabetes,  CAD, hypertension, malnutrition, and OSA (wears CPAP).    Assessment: Received UMR Transition of care referral on 09/03/16.  Transition of care follow up completed, no care management needs, follow up on medication assistance completed, and case will remain closed.   Plan: RNCM will send patient community resource letter with medication assistance program information and encourage patient to apply at  https://www.creon.com/register.      Charron Coultas H. Annia Friendly, BSN, [place] Management [facility] Telephonic CM Phone: [phone] Fax: [phone]

## 2016-09-05 MED FILL — HYDROCODON-APAP 5-325: 5-325 | 7 days supply | Qty: 30 | Fill #0

## 2016-09-13 DIAGNOSIS — J449 Chronic obstructive pulmonary disease, unspecified: Secondary | ICD-10-CM | POA: Diagnosis not present

## 2016-10-04 MED FILL — HYDROCODON-APAP 5-325: 5-325 | 7 days supply | Qty: 30 | Fill #0

## 2016-10-13 DIAGNOSIS — J449 Chronic obstructive pulmonary disease, unspecified: Secondary | ICD-10-CM | POA: Diagnosis not present

## 2016-11-02 MED FILL — METOPROLOL TARTRATE 100 MG: 100 | 90 days supply | Qty: 90 | Fill #0

## 2016-11-02 MED FILL — HYDROCODON-APAP 5-325: 5-325 | 10 days supply | Qty: 30 | Fill #0

## 2016-11-02 MED FILL — HYDROCHLOROTHIAZIDE 25 MG T: 25 | 90 days supply | Qty: 90 | Fill #0

## 2016-11-02 MED FILL — LOSARTAN POTASSIUM 50 MG TA: 50 | 90 days supply | Qty: 90 | Fill #0

## 2016-11-02 MED FILL — metFORMIN HCL 500 MG TABS: 500 | 90 days supply | Qty: 90 | Fill #0

## 2016-11-02 MED FILL — CLOPIDOGREL 75 MG TABLET: 75 | 90 days supply | Qty: 90 | Fill #0

## 2016-11-02 MED FILL — PRAVASTATIN SODIUM 20 MG TA: 20 | 90 days supply | Qty: 90 | Fill #0

## 2016-11-13 DIAGNOSIS — J449 Chronic obstructive pulmonary disease, unspecified: Secondary | ICD-10-CM | POA: Diagnosis not present

## 2016-11-30 MED FILL — HYDROCODON-APAP 5-325: 5-325 | 8 days supply | Qty: 30 | Fill #0

## 2016-12-14 DIAGNOSIS — J449 Chronic obstructive pulmonary disease, unspecified: Secondary | ICD-10-CM | POA: Diagnosis not present

## 2017-01-13 DIAGNOSIS — J449 Chronic obstructive pulmonary disease, unspecified: Secondary | ICD-10-CM | POA: Diagnosis not present

## 2017-02-13 DIAGNOSIS — J449 Chronic obstructive pulmonary disease, unspecified: Secondary | ICD-10-CM | POA: Diagnosis not present

## 2017-03-07 ENCOUNTER — Other Ambulatory Visit: Payer: Self-pay | Admitting: Family Medicine

## 2017-03-07 DIAGNOSIS — F17200 Nicotine dependence, unspecified, uncomplicated: Secondary | ICD-10-CM | POA: Diagnosis not present

## 2017-03-07 DIAGNOSIS — F1190 Opioid use, unspecified, uncomplicated: Secondary | ICD-10-CM | POA: Diagnosis not present

## 2017-03-07 DIAGNOSIS — K861 Other chronic pancreatitis: Secondary | ICD-10-CM | POA: Diagnosis not present

## 2017-03-07 DIAGNOSIS — E1129 Type 2 diabetes mellitus with other diabetic kidney complication: Secondary | ICD-10-CM | POA: Diagnosis not present

## 2017-03-07 DIAGNOSIS — R809 Proteinuria, unspecified: Secondary | ICD-10-CM | POA: Diagnosis not present

## 2017-03-07 DIAGNOSIS — I2510 Atherosclerotic heart disease of native coronary artery without angina pectoris: Secondary | ICD-10-CM | POA: Diagnosis not present

## 2017-03-07 DIAGNOSIS — F1020 Alcohol dependence, uncomplicated: Secondary | ICD-10-CM | POA: Diagnosis not present

## 2017-03-07 DIAGNOSIS — Z1231 Encounter for screening mammogram for malignant neoplasm of breast: Secondary | ICD-10-CM

## 2017-03-07 DIAGNOSIS — I119 Hypertensive heart disease without heart failure: Secondary | ICD-10-CM | POA: Diagnosis not present

## 2017-03-15 DIAGNOSIS — J449 Chronic obstructive pulmonary disease, unspecified: Secondary | ICD-10-CM | POA: Diagnosis not present

## 2017-03-24 ENCOUNTER — Emergency Department: Payer: 59

## 2017-03-24 ENCOUNTER — Encounter: Payer: Self-pay | Admitting: Intensive Care

## 2017-03-24 ENCOUNTER — Emergency Department
Admission: EM | Admit: 2017-03-24 | Discharge: 2017-03-24 | Disposition: A | Discharge status: Home / Self Care | Payer: 59 | Attending: Emergency Medicine | Admitting: Emergency Medicine

## 2017-03-24 DIAGNOSIS — E119 Type 2 diabetes mellitus without complications: Secondary | ICD-10-CM | POA: Diagnosis not present

## 2017-03-24 DIAGNOSIS — J449 Chronic obstructive pulmonary disease, unspecified: Secondary | ICD-10-CM | POA: Diagnosis not present

## 2017-03-24 DIAGNOSIS — R1013 Epigastric pain: Secondary | ICD-10-CM | POA: Diagnosis not present

## 2017-03-24 DIAGNOSIS — I110 Hypertensive heart disease with heart failure: Secondary | ICD-10-CM | POA: Insufficient documentation

## 2017-03-24 DIAGNOSIS — R1010 Upper abdominal pain, unspecified: Secondary | ICD-10-CM | POA: Diagnosis not present

## 2017-03-24 DIAGNOSIS — Z7902 Long term (current) use of antithrombotics/antiplatelets: Secondary | ICD-10-CM | POA: Insufficient documentation

## 2017-03-24 DIAGNOSIS — Z955 Presence of coronary angioplasty implant and graft: Secondary | ICD-10-CM | POA: Insufficient documentation

## 2017-03-24 DIAGNOSIS — Z79899 Other long term (current) drug therapy: Secondary | ICD-10-CM | POA: Insufficient documentation

## 2017-03-24 DIAGNOSIS — F17210 Nicotine dependence, cigarettes, uncomplicated: Secondary | ICD-10-CM | POA: Insufficient documentation

## 2017-03-24 DIAGNOSIS — Z951 Presence of aortocoronary bypass graft: Secondary | ICD-10-CM | POA: Insufficient documentation

## 2017-03-24 DIAGNOSIS — I509 Heart failure, unspecified: Secondary | ICD-10-CM | POA: Diagnosis not present

## 2017-03-24 DIAGNOSIS — Z7984 Long term (current) use of oral hypoglycemic drugs: Secondary | ICD-10-CM | POA: Diagnosis not present

## 2017-03-24 DIAGNOSIS — R1011 Right upper quadrant pain: Secondary | ICD-10-CM | POA: Insufficient documentation

## 2017-03-24 LAB — COMPREHENSIVE METABOLIC PANEL
ALK PHOS: 68 U/L (ref 38–126)
ALT: 38 U/L (ref 14–54)
ANION GAP: 6 (ref 5–15)
AST: 44 U/L — ABNORMAL HIGH (ref 15–41)
Albumin: 3.9 g/dL (ref 3.5–5.0)
BILIRUBIN TOTAL: 0.7 mg/dL (ref 0.3–1.2)
BUN: 10 mg/dL (ref 6–20)
CALCIUM: 9.1 mg/dL (ref 8.9–10.3)
CO2: 25 mmol/L (ref 22–32)
Chloride: 108 mmol/L (ref 101–111)
Creatinine, Ser: 0.62 mg/dL (ref 0.44–1.00)
GFR calc non Af Amer: >60 mL/min (ref >60)
Glucose, Bld: 86 mg/dL (ref 65–99)
Potassium: 4.2 mmol/L (ref 3.5–5.1)
Sodium: 139 mmol/L (ref 135–145)
TOTAL PROTEIN: 7.4 g/dL (ref 6.5–8.1)

## 2017-03-24 LAB — URINALYSIS, COMPLETE (UACMP) WITH MICROSCOPIC
Bacteria, UA: NONE SEEN
Bilirubin Urine: NEGATIVE
GLUCOSE, UA: NEGATIVE mg/dL
Hgb urine dipstick: NEGATIVE
KETONES UR: NEGATIVE mg/dL
NITRITE: NEGATIVE
PH: 5 (ref 5.0–8.0)
Protein, ur: NEGATIVE mg/dL
Specific Gravity, Urine: 1.018 (ref 1.005–1.030)

## 2017-03-24 LAB — CBC
HEMATOCRIT: 44.2 % (ref 35.0–47.0)
HEMOGLOBIN: 14.8 g/dL (ref 12.0–16.0)
MCH: 31.7 pg (ref 26.0–34.0)
MCHC: 33.4 g/dL (ref 32.0–36.0)
MCV: 94.7 fL (ref 80.0–100.0)
Platelets: 227 10*3/uL (ref 150–440)
RBC: 4.67 MIL/uL (ref 3.80–5.20)
RDW: 13.3 % (ref 11.5–14.5)
WBC: 6 10*3/uL (ref 3.6–11.0)

## 2017-03-24 LAB — LIPASE, BLOOD: Lipase: 19 U/L (ref 11–51)

## 2017-03-24 LAB — POCT PREGNANCY, URINE: Preg Test, Ur: NEGATIVE

## 2017-03-24 LAB — TROPONIN I: Troponin I: <0.03 ng/mL (ref <0.03)

## 2017-03-24 MED ORDER — ONDANSETRON 4 MG PO TBDP: 4.0000 mg | ORAL_TABLET | Freq: Three times a day (TID) | ORAL | 0 refills | Status: DC | PRN

## 2017-03-24 MED ORDER — ONDANSETRON HCL 4 MG/2ML IJ SOLN
4.0000 mg | Freq: Once | INTRAMUSCULAR | Status: AC
  Administered 2017-03-24: 4 mg via INTRAVENOUS
  Filled 2017-03-24: qty 2

## 2017-03-24 MED ORDER — MORPHINE SULFATE (PF) 4 MG/ML IV SOLN
4.0000 mg | Freq: Once | INTRAVENOUS | Status: AC
  Administered 2017-03-24: 4 mg via INTRAVENOUS
  Filled 2017-03-24: qty 1

## 2017-03-24 MED ORDER — MORPHINE SULFATE (PF) 4 MG/ML IV SOLN
4.0000 mg | Freq: Once | INTRAVENOUS | Status: AC
  Administered 2017-03-24: 4 mg via INTRAVENOUS

## 2017-03-24 MED ORDER — SODIUM CHLORIDE 0.9 % IV BOLUS (SEPSIS)
1000.0000 mL | Freq: Once | INTRAVENOUS | Status: AC
  Administered 2017-03-24: 1000 mL via INTRAVENOUS

## 2017-03-24 MED ORDER — HYDROCODONE-ACETAMINOPHEN 5-325 MG PO TABS
1.0000 | ORAL_TABLET | ORAL | 0 refills | Status: DC | PRN
Start: 2017-03-24 — End: 2018-01-20

## 2017-03-24 MED ORDER — MORPHINE SULFATE (PF) 4 MG/ML IV SOLN
INTRAVENOUS | Status: AC
  Filled 2017-03-24: qty 1

## 2017-03-24 NOTE — ED Notes (Signed)
Lab called stating tubes hemolyzed. Edison Nasuti EDT to straight stick for blood.

## 2017-03-24 NOTE — ED Triage Notes (Signed)
Patient c/o abdominal pain since 0330 today. HX pancreatitis. Reports N/V/D

## 2017-03-24 NOTE — ED Notes (Signed)
ED Provider at bedside. 

## 2017-03-24 NOTE — ED Provider Notes (Signed)
[facility] Emergency Department Provider Note  Time seen: 10:22 AM  I have reviewed the triage vital signs and the nursing notes.   HISTORY  Chief Complaint Abdominal Pain    HPI Marie [name] is a 51 y.o. female with a past medical history of CHF, pancreatitis, alcohol use, hypertension, diabetes, presents to the emergency department with abdominal pain.  According to the patient she was awoken around 4:00 this morning with mid to right-sided abdominal pain and burning.  States it feels similar to prior pancreatitis episode she has had but has not had one in over one year.  Patient does admit to alcohol use including 2 mimosas and 4 glasses of wine yesterday.  Patient still has her gallbladder.  Denies any fever.  States nausea, vomiting and diarrhea today.  Past Medical History:  Diagnosis Date  . CHF (congestive heart failure) ([place])   . Chronic pancreatitis ([place])   . Coronary atherosclerosis of unspecified type of vessel, native or graft   . XNATFTDD(220.2)    "weekly" (07/29/2013)  . Hypertension   . Leiomyoma of uterus, unspecified   . Loss of weight   . Obesity, unspecified   . OSA (obstructive sleep apnea)    no CPAP  . Pure hypercholesterolemia   . Sarcoidosis   . Shortness of breath    none since stent 2 yrs ago  . Type II diabetes mellitus [facility])     Patient Active Problem List   Diagnosis Date Noted  . Malnutrition of moderate degree 08/30/2016  . SBO (small bowel obstruction) ([place]) 08/28/2016  . History of coronary artery bypass graft 08/28/2016  . COPD with chronic bronchitis ([place]) 08/28/2016  . Essential hypertension 08/28/2016  . Chronic pain 08/28/2016  . ETOH abuse 08/28/2016  . Abdominal pain   . COPD (chronic obstructive pulmonary disease) ([place]) 08/13/2013  . Hypoxemia 08/13/2013  . Syncope 07/29/2013  . Dehydration 07/29/2013  . Alcohol abuse, continuous 08/11/2012  . Upper abdominal pain 08/11/2012  . Abnormal uterine  bleeding 08/04/2012  . Fibroid uterus 08/04/2012  . Hypokalemia 03/24/2011  . Acute pancreatitis 03/22/2011  . BRONCHITIS, OBSTRUCTIVE CHRONIC 04/14/2010  . 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 WEIGHT LOSS 03/24/2010  . FIBROIDS, UTERUS 08/24/2009  . ACID REFLUX DISEASE 08/03/2009  . HYPERCHOLESTEROLEMIA 02/22/2009  . OBESITY 12/10/2008  . TOBACCO ABUSE 12/10/2008  . PANCREATITIS, CHRONIC 12/10/2008  . HEART DISEASE 12/02/2008  . OSA (obstructive sleep apnea) 12/02/2008  . Diabetes mellitus with complication ([place]) 54/27/0623  . CAD 09/25/1998    Past Surgical History:  Procedure Laterality Date  . CORONARY ANGIOPLASTY WITH STENT PLACEMENT  09/2009   "1"  . CORONARY ARTERY BYPASS GRAFT  ~ 2000   CABG X3  . HYSTEROSCOPY WITH NOVASURE N/A 09/04/2012   Procedure: HYSTEROSCOPY WITH NOVASURE;  Surgeon: Mora Bellman, MD;  Location: [place] [facility];  Service: Gynecology;  Laterality: N/A;  with removal intrauterine device  . TUBAL LIGATION  1997    Prior to Admission medications   Medication Sig Start Date End Date Taking? Authorizing Provider  albuterol (PROVENTIL HFA;VENTOLIN HFA) 108 (90 BASE) MCG/ACT inhaler Inhale 2 puffs into the lungs every 6 (six) hours as needed for wheezing or shortness of breath. Patient not taking: Reported on 09/03/2016 08/13/13   Collene Gobble, MD  aspirin 325 MG tablet Take 325 mg by mouth daily as needed for mild pain.     [provider]  clopidogrel (PLAVIX) 75 MG tablet Take 75 mg by mouth daily  with breakfast.     [provider]  cyclobenzaprine (FLEXERIL) 5 MG tablet 1 pill by mouth up to every 8 hours as needed. Start with one pill by mouth each bedtime as needed due to sedation Patient not taking: Reported on 08/28/2016 06/19/15   Wendie Agreste, MD  dicyclomine (BENTYL) 20 MG tablet Take 1 tablet (20 mg total) by mouth 2 (two) times daily. Patient not taking: Reported on 07/02/2016 02/18/15   Anne Ng, PA-C  hydrochlorothiazide  (HYDRODIURIL) 25 MG tablet Take 25 mg by mouth daily.  08/15/12   Oswald Hillock, MD  HYDROcodone-acetaminophen (NORCO/VICODIN) 5-325 MG tablet Take 1 tablet by mouth every 6 (six) hours as needed for moderate pain.    [provider]  ibuprofen (ADVIL,MOTRIN) 200 MG tablet Take 400-600 mg by mouth every 6 (six) hours as needed for moderate pain.    [provider]  lipase/protease/amylase (CREON) 12000 units CPEP capsule Take 1 capsule (12,000 Units total) by mouth 3 (three) times daily with meals. Patient not taking: Reported on 09/03/2016 09/01/16   Nita Sells, MD  losartan (COZAAR) 50 MG tablet Take 50 mg by mouth daily. 06/11/16   [provider]  metFORMIN (GLUCOPHAGE) 500 MG tablet Take 500 mg by mouth daily with breakfast. Take with food    [provider]  metoprolol (LOPRESSOR) 100 MG tablet Take 100 mg by mouth daily.  05/30/15   [provider]  ondansetron (ZOFRAN ODT) 4 MG disintegrating tablet Take 1 tablet (4 mg total) by mouth every 8 (eight) hours as needed. Patient not taking: Reported on 09/03/2016 10/12/15   Lanae Crumbly, PA-C  pantoprazole (PROTONIX) 40 MG tablet Take 1 tablet (40 mg total) by mouth daily. 07/02/16   Lajean Saver, MD  pravastatin (PRAVACHOL) 20 MG tablet Take 20 mg by mouth daily.     [provider]    Allergies  Allergen Reactions  . Lisinopril Hives    Family History  Problem Relation Age of Onset  . Diabetes Mother   . Coronary artery disease Mother   . Heart disease Mother   . Hyperlipidemia Mother     Social History Social History   Tobacco Use  . Smoking status: Current Every Day Smoker    Packs/day: 0.50    Years: 32.00    Pack years: 16.00    Types: Cigarettes  . Smokeless tobacco: Never Used  Substance Use Topics  . Alcohol use: Yes    Alcohol/week: 14.4 oz    Types: 24 Glasses of wine per week    Comment: 08/28/2016 "3-4 glasses of wine qd; last glass was last night",  .  Drug use: No    Review of Systems Constitutional: Negative for fever. Eyes: Negative for visual changes. ENT: Negative for congestion Cardiovascular: Negative for chest pain. Respiratory: Negative for shortness of breath. Gastrointestinal: Positive for abdominal pain.  Positive for nausea vomiting diarrhea. Genitourinary: Negative for dysuria. Musculoskeletal: Negative for back pain. Skin: Negative for rash. Neurological: Moderate headache yesterday, mild currently. All other ROS negative  ____________________________________________   PHYSICAL EXAM:  VITAL SIGNS: ED Triage Vitals  Enc Vitals Group     BP 03/24/17 0957 (!) 157/97     Pulse Rate 03/24/17 0957 81     Resp 03/24/17 0957 16     Temp 03/24/17 0957 97.8 F (36.6 C)     Temp Source 03/24/17 0957 Oral     SpO2 03/24/17 0957 96 %  Weight 03/24/17 0955 127 lb (57.6 kg)     Height 03/24/17 0955 5\' 2"  (1.575 m)     Head Circumference --      Peak Flow --      Pain Score 03/24/17 0955 10     Pain Loc --      Pain Edu? --      Excl. in [place]? --    Constitutional: Alert and oriented. Well appearing and in no distress. Eyes: Normal exam ENT   Head: Normocephalic and atraumatic.   Mouth/Throat: Mucous membranes are moist. Cardiovascular: Normal rate, regular rhythm. No murmur Respiratory: Normal respiratory effort without tachypnea nor retractions. Breath sounds are clear  Gastrointestinal: Soft, moderate periumbilical epigastric and right upper quadrant tenderness.  No rebound or guarding.  No distention. Musculoskeletal: Nontender with normal range of motion in all extremities.  Neurologic:  Normal speech and language. No gross focal neurologic deficits Skin:  Skin is warm, dry and intact.  Psychiatric: Mood and affect are normal.   ____________________________________________     RADIOLOGY  Ultrasound negative  ____________________________________________   INITIAL IMPRESSION / ASSESSMENT AND  PLAN / ED COURSE  Pertinent labs & imaging results that were available during my care of the patient were reviewed by me and considered in my medical decision making (see chart for details).  Patient presents the emergency department for abdominal pain nausea vomiting diarrhea.  Differential would include pancreatitis, gastroenteritis, gallbladder disease, SBO, gastritis.  We will check labs, treat pain and nausea, IV hydrate and obtain a right upper quadrant ultrasound to further evaluate.  Patient agreeable to this plan of care.  I reviewed the patient's records including most recent discharge summary 09/01/16 which the patient was admitted for an ileus versus early SBO.  Ultrasound is negative.  Labs are largely within normal limits.  Patient states she is feeling much better.  I discussed the options with the patient as far as discharge home with strict return precautions versus obtaining a CT scan in the emergency department.  Patient wishes to hold off on CT imaging at this time.  We will discharge home with nausea medication and a very short course of pain medication.  I discussed with the patient avoiding alcohol products and adhering to a low-fat diet.  Patient agreeable to plan.  ____________________________________________   FINAL CLINICAL IMPRESSION(S) / ED DIAGNOSES  Abdominal pain    Harvest Dark, MD 03/24/17 1418

## 2017-04-12 ENCOUNTER — Ambulatory Visit: Payer: 59 | Admitting: Obstetrics & Gynecology

## 2017-04-15 DIAGNOSIS — J449 Chronic obstructive pulmonary disease, unspecified: Secondary | ICD-10-CM | POA: Diagnosis not present

## 2017-04-19 ENCOUNTER — Encounter: Payer: Self-pay | Admitting: *Deleted

## 2017-05-13 ENCOUNTER — Emergency Department
Admission: EM | Admit: 2017-05-13 | Discharge: 2017-05-13 | Disposition: A | Discharge status: Home / Self Care | Payer: 59 | Attending: Emergency Medicine | Admitting: Emergency Medicine

## 2017-05-13 ENCOUNTER — Encounter: Payer: Self-pay | Admitting: Emergency Medicine

## 2017-05-13 ENCOUNTER — Other Ambulatory Visit: Payer: Self-pay

## 2017-05-13 DIAGNOSIS — Z7982 Long term (current) use of aspirin: Secondary | ICD-10-CM | POA: Diagnosis not present

## 2017-05-13 DIAGNOSIS — I509 Heart failure, unspecified: Secondary | ICD-10-CM | POA: Diagnosis not present

## 2017-05-13 DIAGNOSIS — R197 Diarrhea, unspecified: Secondary | ICD-10-CM | POA: Diagnosis not present

## 2017-05-13 DIAGNOSIS — R1013 Epigastric pain: Secondary | ICD-10-CM | POA: Insufficient documentation

## 2017-05-13 DIAGNOSIS — I110 Hypertensive heart disease with heart failure: Secondary | ICD-10-CM | POA: Diagnosis not present

## 2017-05-13 DIAGNOSIS — F17210 Nicotine dependence, cigarettes, uncomplicated: Secondary | ICD-10-CM | POA: Diagnosis not present

## 2017-05-13 DIAGNOSIS — Z79899 Other long term (current) drug therapy: Secondary | ICD-10-CM | POA: Diagnosis not present

## 2017-05-13 DIAGNOSIS — K529 Noninfective gastroenteritis and colitis, unspecified: Secondary | ICD-10-CM | POA: Diagnosis not present

## 2017-05-13 DIAGNOSIS — R109 Unspecified abdominal pain: Secondary | ICD-10-CM | POA: Diagnosis present

## 2017-05-13 LAB — COMPREHENSIVE METABOLIC PANEL
ALBUMIN: 4.3 g/dL (ref 3.5–5.0)
ALK PHOS: 67 U/L (ref 38–126)
ALT: 41 U/L (ref 14–54)
ANION GAP: 7 (ref 5–15)
AST: 60 U/L — ABNORMAL HIGH (ref 15–41)
BILIRUBIN TOTAL: 0.8 mg/dL (ref 0.3–1.2)
BUN: 11 mg/dL (ref 6–20)
CALCIUM: 9.2 mg/dL (ref 8.9–10.3)
CO2: 28 mmol/L (ref 22–32)
Chloride: 105 mmol/L (ref 101–111)
Creatinine, Ser: 0.77 mg/dL (ref 0.44–1.00)
GFR calc non Af Amer: >60 mL/min (ref >60)
Glucose, Bld: 122 mg/dL — ABNORMAL HIGH (ref 65–99)
POTASSIUM: 4 mmol/L (ref 3.5–5.1)
SODIUM: 140 mmol/L (ref 135–145)
TOTAL PROTEIN: 7.8 g/dL (ref 6.5–8.1)

## 2017-05-13 LAB — URINALYSIS, COMPLETE (UACMP) WITH MICROSCOPIC
BACTERIA UA: NONE SEEN
BILIRUBIN URINE: NEGATIVE
GLUCOSE, UA: NEGATIVE mg/dL
HGB URINE DIPSTICK: NEGATIVE
KETONES UR: NEGATIVE mg/dL
Leukocytes, UA: NEGATIVE
NITRITE: NEGATIVE
PROTEIN: NEGATIVE mg/dL
Specific Gravity, Urine: 1.019 (ref 1.005–1.030)
pH: 6 (ref 5.0–8.0)

## 2017-05-13 LAB — CBC
HEMATOCRIT: 47.1 % — AB (ref 35.0–47.0)
HEMOGLOBIN: 15.6 g/dL (ref 12.0–16.0)
MCH: 32 pg (ref 26.0–34.0)
MCHC: 33.2 g/dL (ref 32.0–36.0)
MCV: 96.5 fL (ref 80.0–100.0)
Platelets: 211 10*3/uL (ref 150–440)
RBC: 4.88 MIL/uL (ref 3.80–5.20)
RDW: 13.3 % (ref 11.5–14.5)
WBC: 6.5 10*3/uL (ref 3.6–11.0)

## 2017-05-13 LAB — LIPASE, BLOOD: Lipase: 22 U/L (ref 11–51)

## 2017-05-13 LAB — TROPONIN I: Troponin I: <0.03 ng/mL (ref <0.03)

## 2017-05-13 MED ORDER — ONDANSETRON HCL 4 MG PO TABS: 4.0000 mg | ORAL_TABLET | Freq: Three times a day (TID) | ORAL | 0 refills | Status: DC | PRN

## 2017-05-13 MED ORDER — ACETAMINOPHEN 500 MG PO TABS
1000.0000 mg | ORAL_TABLET | Freq: Once | ORAL | Status: AC
  Administered 2017-05-13: 1000 mg via ORAL
  Filled 2017-05-13: qty 2

## 2017-05-13 MED ORDER — SODIUM CHLORIDE 0.9 % IV BOLUS (SEPSIS)
1000.0000 mL | Freq: Once | INTRAVENOUS | Status: AC
  Administered 2017-05-13: 1000 mL via INTRAVENOUS

## 2017-05-13 MED ORDER — KETOROLAC TROMETHAMINE 30 MG/ML IJ SOLN
15.0000 mg | Freq: Once | INTRAMUSCULAR | Status: AC
  Administered 2017-05-13: 15 mg via INTRAVENOUS
  Filled 2017-05-13: qty 1

## 2017-05-13 MED ORDER — ONDANSETRON HCL 4 MG/2ML IJ SOLN
4.0000 mg | Freq: Once | INTRAMUSCULAR | Status: AC
  Administered 2017-05-13: 4 mg via INTRAVENOUS
  Filled 2017-05-13: qty 2

## 2017-05-13 MED ORDER — DICYCLOMINE HCL 10 MG PO CAPS: 20.0000 mg | ORAL_CAPSULE | Freq: Four times a day (QID) | ORAL | 0 refills | Status: DC

## 2017-05-13 NOTE — Discharge Instructions (Signed)

## 2017-05-13 NOTE — ED Triage Notes (Signed)
Pt with generalized abd pain started this morning with diarrhea, nausea. No vomiting.

## 2017-05-13 NOTE — ED Provider Notes (Signed)
[facility] Emergency Department Provider Note  ____________________________________________  Time seen: Approximately 9:27 AM  I have reviewed the triage vital signs and the nursing notes.   HISTORY  Chief Complaint Abdominal Pain   HPI Marie [name] is a 52 y.o. female a history of alcohol abuse, chronic pancreatitis, CAD, hypertension, diabetes and presents for evaluation of abdominal pain. Patient reports that the pain started at 4:30 AM this morning the pain is sharp, 10 out of 10, located in her upper abdominal region radiating to her back, associated with nausea and three episodes of watery diarrhea. No vomiting, hematemesis, coffee-ground emesis, melena, or hematochezia. No fever or chills. No dysuria or hematuria.No prior abdominal surgeries. No prior history of C. difficile. Patient last alcohol intake was 2 days ago.  Past Medical History:  Diagnosis Date  . CHF (congestive heart failure) ([place])   . Chronic pancreatitis ([place])   . Coronary atherosclerosis of unspecified type of vessel, native or graft   . NIOEVOJJ(009.3)    "weekly" (07/29/2013)  . Hypertension   . Leiomyoma of uterus, unspecified   . Loss of weight   . Obesity, unspecified   . OSA (obstructive sleep apnea)    no CPAP  . Pure hypercholesterolemia   . Sarcoidosis   . Shortness of breath    none since stent 2 yrs ago  . Type II diabetes mellitus [facility])     Patient Active Problem List   Diagnosis Date Noted  . Malnutrition of moderate degree 08/30/2016  . SBO (small bowel obstruction) ([place]) 08/28/2016  . History of coronary artery bypass graft 08/28/2016  . COPD with chronic bronchitis ([place]) 08/28/2016  . Essential hypertension 08/28/2016  . Chronic pain 08/28/2016  . ETOH abuse 08/28/2016  . Abdominal pain   . COPD (chronic obstructive pulmonary disease) ([place]) 08/13/2013  . Hypoxemia 08/13/2013  . Syncope 07/29/2013  . Dehydration 07/29/2013  . Alcohol abuse,  continuous 08/11/2012  . Upper abdominal pain 08/11/2012  . Abnormal uterine bleeding 08/04/2012  . Fibroid uterus 08/04/2012  . Hypokalemia 03/24/2011  . Acute pancreatitis 03/22/2011  . BRONCHITIS, OBSTRUCTIVE CHRONIC 04/14/2010  . 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 WEIGHT LOSS 03/24/2010  . FIBROIDS, UTERUS 08/24/2009  . ACID REFLUX DISEASE 08/03/2009  . HYPERCHOLESTEROLEMIA 02/22/2009  . OBESITY 12/10/2008  . TOBACCO ABUSE 12/10/2008  . PANCREATITIS, CHRONIC 12/10/2008  . HEART DISEASE 12/02/2008  . OSA (obstructive sleep apnea) 12/02/2008  . Diabetes mellitus with complication ([place]) 81/82/9937  . CAD 09/25/1998    Past Surgical History:  Procedure Laterality Date  . CORONARY ANGIOPLASTY WITH STENT PLACEMENT  09/2009   "1"  . CORONARY ARTERY BYPASS GRAFT  ~ 2000   CABG X3  . HYSTEROSCOPY WITH NOVASURE N/A 09/04/2012   Procedure: HYSTEROSCOPY WITH NOVASURE;  Surgeon: Mora Bellman, MD;  Location: [place] [facility];  Service: Gynecology;  Laterality: N/A;  with removal intrauterine device  . TUBAL LIGATION  1997    Prior to Admission medications   Medication Sig Start Date End Date Taking? Authorizing Provider  aspirin 325 MG tablet Take 325 mg by mouth daily as needed for mild pain.    Yes [provider]  hydrochlorothiazide (HYDRODIURIL) 25 MG tablet Take 25 mg by mouth daily.  08/15/12  Yes Oswald Hillock, MD  losartan (COZAAR) 50 MG tablet Take 50 mg by mouth daily. 06/11/16  Yes [provider]  metFORMIN (GLUCOPHAGE) 500 MG tablet Take 500 mg by mouth daily with breakfast. Take with food  Yes [provider]  metoprolol (LOPRESSOR) 100 MG tablet Take 100 mg by mouth daily.  05/30/15  Yes [provider]  pravastatin (PRAVACHOL) 20 MG tablet Take 20 mg by mouth daily.    Yes [provider]  albuterol (PROVENTIL HFA;VENTOLIN HFA) 108 (90 BASE) MCG/ACT inhaler Inhale 2 puffs into the lungs every 6 (six) hours as needed for wheezing or  shortness of breath. Patient not taking: Reported on 09/03/2016 08/13/13   Collene Gobble, MD  cyclobenzaprine (FLEXERIL) 5 MG tablet 1 pill by mouth up to every 8 hours as needed. Start with one pill by mouth each bedtime as needed due to sedation Patient not taking: Reported on 08/28/2016 06/19/15   Wendie Agreste, MD  dicyclomine (BENTYL) 10 MG capsule Take 2 capsules (20 mg total) by mouth 4 (four) times daily for 14 days. 05/13/17 05/27/17  Rudene Re, MD  HYDROcodone-acetaminophen (NORCO/VICODIN) 5-325 MG tablet Take 1 tablet by mouth every 4 (four) hours as needed. 03/24/17   Harvest Dark, MD  lipase/protease/amylase (CREON) 12000 units CPEP capsule Take 1 capsule (12,000 Units total) by mouth 3 (three) times daily with meals. Patient not taking: Reported on 09/03/2016 09/01/16   Nita Sells, MD  ondansetron (ZOFRAN ODT) 4 MG disintegrating tablet Take 1 tablet (4 mg total) by mouth every 8 (eight) hours as needed for nausea or vomiting. 03/24/17   Harvest Dark, MD  ondansetron (ZOFRAN) 4 MG tablet Take 1 tablet (4 mg total) by mouth every 8 (eight) hours as needed for nausea or vomiting. 05/13/17   Alfred Levins, [place], MD  pantoprazole (PROTONIX) 40 MG tablet Take 1 tablet (40 mg total) by mouth daily. Patient not taking: Reported on 05/13/2017 07/02/16   Lajean Saver, MD    Allergies Lisinopril  Family History  Problem Relation Age of Onset  . Diabetes Mother   . Coronary artery disease Mother   . Heart disease Mother   . Hyperlipidemia Mother     Social History Social History   Tobacco Use  . Smoking status: Current Every Day Smoker    Packs/day: 0.50    Years: 32.00    Pack years: 16.00    Types: Cigarettes  . Smokeless tobacco: Never Used  Substance Use Topics  . Alcohol use: Yes    Alcohol/week: 14.4 oz    Types: 24 Glasses of wine per week    Comment: 08/28/2016 "3-4 glasses of wine qd; last glass was last night",  . Drug use: No    Review of  Systems  Constitutional: Negative for fever. Eyes: Negative for visual changes. ENT: Negative for sore throat. Neck: No neck pain  Cardiovascular: Negative for chest pain. Respiratory: Negative for shortness of breath. Gastrointestinal: + upper abdominal pain, nausea, and diarrhea. No vomiting Genitourinary: Negative for dysuria. Musculoskeletal: Negative for back pain. Skin: Negative for rash. Neurological: Negative for headaches, weakness or numbness. Psych: No SI or HI  ____________________________________________   PHYSICAL EXAM:  VITAL SIGNS: ED Triage Vitals  Enc Vitals Group     BP 05/13/17 0824 (!) 171/97     Pulse Rate 05/13/17 0824 91     Resp 05/13/17 0824 20     Temp 05/13/17 0824 98.5 F (36.9 C)     Temp Source 05/13/17 0824 Oral     SpO2 05/13/17 0824 99 %     Weight 05/13/17 0826 127 lb (57.6 kg)     Height --      Head Circumference --  Peak Flow --      Pain Score 05/13/17 0826 10     Pain Loc --      Pain Edu? --      Excl. in [place]? --     Constitutional: Alert and oriented. Well appearing and in no apparent distress. HEENT:      Head: Normocephalic and atraumatic.         Eyes: Conjunctivae are normal. Sclera is non-icteric.       Mouth/Throat: Mucous membranes are moist.       Neck: Supple with no signs of meningismus. Cardiovascular: Regular rate and rhythm. No murmurs, gallops, or rubs. 2+ symmetrical distal pulses are present in all extremities. No JVD. Respiratory: Normal respiratory effort. Lungs are clear to auscultation bilaterally. No wheezes, crackles, or rhonchi.  Gastrointestinal: Soft, ttp over the epigastric region, and non distended with positive bowel sounds. No rebound or guarding. Musculoskeletal: Nontender with normal range of motion in all extremities. No edema, cyanosis, or erythema of extremities. Neurologic: Normal speech and language. Face is symmetric. Moving all extremities. No gross focal neurologic deficits are  appreciated. Skin: Skin is warm, dry and intact. No rash noted. Psychiatric: Mood and affect are normal. Speech and behavior are normal.  ____________________________________________   LABS (all labs ordered are listed, but only abnormal results are displayed)  Labs Reviewed  COMPREHENSIVE METABOLIC PANEL - Abnormal; Notable for the following components:      Result Value   Glucose, Bld 122 (*)    AST 60 (*)    All other components within normal limits  CBC - Abnormal; Notable for the following components:   HCT 47.1 (*)    All other components within normal limits  URINALYSIS, COMPLETE (UACMP) WITH MICROSCOPIC - Abnormal; Notable for the following components:   Color, Urine YELLOW (*)    APPearance CLEAR (*)    Squamous Epithelial / LPF 0-5 (*)    All other components within normal limits  LIPASE, BLOOD  TROPONIN I   ____________________________________________  EKG  ED ECG REPORT I, Rudene Re, the attending physician, personally viewed and interpreted this ECG.  Normal sinus rhythm, rate of 85, normal intervals, left axis deviation, no ST elevations or depressions, T-wave inversion in V2. No significant changes when compared to prior  ____________________________________________  RADIOLOGY  none  ____________________________________________   PROCEDURES  Procedure(s) performed: None Procedures Critical Care performed:  None ____________________________________________   INITIAL IMPRESSION / ASSESSMENT AND PLAN / ED COURSE   52 y.o. female a history of alcohol abuse, chronic pancreatitis, CAD, hypertension, diabetes and presents for evaluation of epigastric abdominal pain associated with diarrhea. Patient is well-appearing, in no distress, normal vital signs, abdomen is soft with epigastric tenderness, no rebound or guarding, negative Murphy's sign. Differential diagnoses including viral gastroenteritis versus C. difficile versus pancreatitis versus peptic  ulcer disease versus gastritis versus GB disease. Labs including CMP, lipase, and CBC are within normal limits. Patient had a right upper quadrant ultrasound done in December 2018 with no evidence of gallstones therefore at this time and believe patient needs repeat imaging studies that she would normal T bili, alkaline phosphatase, lipase, and LFTs. We'll give IV fluids, Toradol, Tylenol, and Zofran. We'll send stool for C. difficile culture.    _________________________ 12:13 PM on 05/13/2017 -----------------------------------------  Patient emergency department for almost 4 hours with no bowel movements. She is tolerating by mouth. Labs are within normal limits. UA with no evidence of dehydration or infection. At this time presentation concerning  for viral gastroenteritis. We'll discharge home with close follow-up with primary care doctor. Discussed return precautions.   As part of my medical decision making, I reviewed the following data within the [place] notes reviewed and incorporated, Labs reviewed , EKG interpreted , Notes from prior ED visits and [place] Controlled Substance Database    Pertinent labs & imaging results that were available during my care of the patient were reviewed by me and considered in my medical decision making (see chart for details).    ____________________________________________   FINAL CLINICAL IMPRESSION(S) / ED DIAGNOSES  Final diagnoses:  Epigastric pain  Diarrhea of presumed infectious origin      NEW MEDICATIONS STARTED DURING THIS VISIT:  ED Discharge Orders        Ordered    ondansetron (ZOFRAN) 4 MG tablet  Every 8 hours PRN     05/13/17 1212    dicyclomine (BENTYL) 10 MG capsule  4 times daily     05/13/17 1212       Note:  This document was prepared using Dragon voice recognition software and may include unintentional dictation errors.    Alfred Levins, [place], MD 05/13/17 1213

## 2017-05-13 NOTE — ED Notes (Signed)
2 unsuccessful attempts at IV by this RN. Tommy, EMT-P at beside to attempt IV access.

## 2017-05-13 NOTE — ED Notes (Signed)
Patient ambulatory to lobby with steady gait and NAD noted. Verbalized understanding of discharge instructions and follow-up care.  

## 2017-05-16 DIAGNOSIS — J449 Chronic obstructive pulmonary disease, unspecified: Secondary | ICD-10-CM | POA: Diagnosis not present

## 2017-06-13 DIAGNOSIS — J449 Chronic obstructive pulmonary disease, unspecified: Secondary | ICD-10-CM | POA: Diagnosis not present

## 2017-06-20 ENCOUNTER — Ambulatory Visit: Payer: 59 | Admitting: Obstetrics & Gynecology

## 2017-07-14 DIAGNOSIS — J449 Chronic obstructive pulmonary disease, unspecified: Secondary | ICD-10-CM | POA: Diagnosis not present

## 2017-08-13 DIAGNOSIS — J449 Chronic obstructive pulmonary disease, unspecified: Secondary | ICD-10-CM | POA: Diagnosis not present

## 2017-09-13 DIAGNOSIS — F1190 Opioid use, unspecified, uncomplicated: Secondary | ICD-10-CM | POA: Diagnosis not present

## 2017-09-13 DIAGNOSIS — F17200 Nicotine dependence, unspecified, uncomplicated: Secondary | ICD-10-CM | POA: Diagnosis not present

## 2017-09-13 DIAGNOSIS — I2510 Atherosclerotic heart disease of native coronary artery without angina pectoris: Secondary | ICD-10-CM | POA: Diagnosis not present

## 2017-09-13 DIAGNOSIS — Z1231 Encounter for screening mammogram for malignant neoplasm of breast: Secondary | ICD-10-CM | POA: Diagnosis not present

## 2017-09-13 DIAGNOSIS — M6281 Muscle weakness (generalized): Secondary | ICD-10-CM | POA: Diagnosis not present

## 2017-09-13 DIAGNOSIS — R809 Proteinuria, unspecified: Secondary | ICD-10-CM | POA: Diagnosis not present

## 2017-09-13 DIAGNOSIS — J449 Chronic obstructive pulmonary disease, unspecified: Secondary | ICD-10-CM | POA: Diagnosis not present

## 2017-09-13 DIAGNOSIS — I119 Hypertensive heart disease without heart failure: Secondary | ICD-10-CM | POA: Diagnosis not present

## 2017-09-13 DIAGNOSIS — F1020 Alcohol dependence, uncomplicated: Secondary | ICD-10-CM | POA: Diagnosis not present

## 2017-09-13 DIAGNOSIS — K861 Other chronic pancreatitis: Secondary | ICD-10-CM | POA: Diagnosis not present

## 2017-09-13 DIAGNOSIS — E1129 Type 2 diabetes mellitus with other diabetic kidney complication: Secondary | ICD-10-CM | POA: Diagnosis not present

## 2017-10-03 ENCOUNTER — Ambulatory Visit: Payer: 59

## 2017-10-07 ENCOUNTER — Ambulatory Visit (INDEPENDENT_AMBULATORY_CARE_PROVIDER_SITE_OTHER): Payer: 59 | Admitting: Obstetrics & Gynecology

## 2017-10-07 ENCOUNTER — Encounter: Payer: Self-pay | Admitting: Obstetrics & Gynecology

## 2017-10-07 VITALS — BP 170/88 | HR 68 | Wt 121.5 lb

## 2017-10-07 DIAGNOSIS — Z1151 Encounter for screening for human papillomavirus (HPV): Secondary | ICD-10-CM | POA: Diagnosis not present

## 2017-10-07 DIAGNOSIS — Z124 Encounter for screening for malignant neoplasm of cervix: Secondary | ICD-10-CM | POA: Diagnosis not present

## 2017-10-07 DIAGNOSIS — Z01419 Encounter for gynecological examination (general) (routine) without abnormal findings: Secondary | ICD-10-CM

## 2017-10-07 NOTE — Progress Notes (Signed)
Subjective:     Marie [name] is a 52 y.o. female here for a routine exam. G3P3  LMP 2015 after endometrial ablation. Current complaints: pt denies hot flushes or other changes.  No GYN complaints.     Gynecologic History No LMP recorded. Patient has had an ablation. Contraception: post menopausal status Last Pap: 01/28/2014. Results were: normal Last mammogram: 03/22/2014. Results were: abnormal birad 2 suspect benign  Obstetric History OB History  Gravida Para Term Preterm AB Living  3 3 3     3   SAB TAB Ectopic Multiple Live Births               # Outcome Date GA Lbr Len/2nd Weight Sex Delivery Anes PTL Lv  3 Term           2 Term           1 Term             The following portions of the patient's history were reviewed and updated as appropriate: allergies, current medications, past family history, past medical history, past social history, past surgical history and problem list. Review of Systems Pertinent items are noted in HPI.    Objective:  BP (!) 170/88   Pulse 68   Wt 121 lb 8 oz (55.1 kg)   BMI 22.22 kg/m   General Appearance:    Alert, cooperative, no distress, appears stated age  Head:    Normocephalic, without obvious abnormality, atraumatic  Eyes:    conjunctiva/corneas clear, EOM's intact, both eyes  Ears:    Normal external ear canals, both ears  Nose:   Nares normal, septum midline, mucosa normal, no drainage    or sinus tenderness  Throat:   Lips, mucosa, and tongue normal; teeth and gums normal  Neck:   Supple, symmetrical, trachea midline, no adenopathy;    thyroid:  no enlargement/tenderness/nodules  Back:     Symmetric, no curvature, ROM normal, no CVA tenderness  Lungs:     Clear to auscultation bilaterally, respirations unlabored  Chest Wall:    No tenderness or deformity   Heart:    Regular rate and rhythm, S1 and S2 normal, no murmur, rub   or gallop  Breast Exam:    No tenderness, masses, or nipple abnormality  Abdomen:     Soft,  non-tender, bowel sounds active all four quadrants,    no masses, no organomegaly  Genitalia:    Normal female without lesion, discharge or tenderness     Extremities:   Extremities normal, atraumatic, no cyanosis or edema  Pulses:   2+ and symmetric all extremities  Skin:   Skin color, texture, turgor normal, no rashes or lesions     Assessment:    Healthy female exam.    Plan:    Follow up in: 1 year.    F/u PAP with hrHPV mammogram 10/24/2017 scheduled already  [place]. Harraway-Smith, M.D., Marie [name]

## 2017-10-09 LAB — CYTOLOGY - PAP
Diagnosis: UNDETERMINED — AB
HPV: DETECTED — AB

## 2017-10-11 ENCOUNTER — Telehealth: Payer: Self-pay | Admitting: General Practice

## 2017-10-11 NOTE — Telephone Encounter (Signed)
-----   Message from Lavonia Drafts, MD sent at 10/10/2017  9:20 PM EDT ----- Please call pt. She has an abnormal PAP and needs a colpo.   Thx, clh-S

## 2017-10-11 NOTE — Telephone Encounter (Signed)
Called patient, no answer- left message on voicemail to call us back for results.

## 2017-10-13 DIAGNOSIS — J449 Chronic obstructive pulmonary disease, unspecified: Secondary | ICD-10-CM | POA: Diagnosis not present

## 2017-10-14 NOTE — Telephone Encounter (Signed)
Patient called and left message stating she is returning our call for results.

## 2017-10-15 NOTE — Telephone Encounter (Signed)
Called patient & informed her of pap results & explained colposcopy procedure. Discussed someone from our front office will contact her with this appt. Patient verbalized understanding & had no questions.

## 2017-10-16 ENCOUNTER — Encounter (INDEPENDENT_AMBULATORY_CARE_PROVIDER_SITE_OTHER): Payer: Self-pay

## 2017-10-24 ENCOUNTER — Ambulatory Visit
Admission: RE | Admit: 2017-10-24 | Discharge: 2017-10-24 | Disposition: A | Discharge status: Home / Self Care | Payer: 59 | Source: Ambulatory Visit | Attending: Family Medicine | Admitting: Family Medicine

## 2017-10-24 DIAGNOSIS — Z1231 Encounter for screening mammogram for malignant neoplasm of breast: Secondary | ICD-10-CM

## 2017-10-30 ENCOUNTER — Ambulatory Visit: Payer: 59 | Admitting: Obstetrics & Gynecology

## 2017-11-13 DIAGNOSIS — J449 Chronic obstructive pulmonary disease, unspecified: Secondary | ICD-10-CM | POA: Diagnosis not present

## 2017-12-14 DIAGNOSIS — J449 Chronic obstructive pulmonary disease, unspecified: Secondary | ICD-10-CM | POA: Diagnosis not present

## 2017-12-18 ENCOUNTER — Emergency Department: Payer: 59

## 2017-12-18 ENCOUNTER — Other Ambulatory Visit: Payer: Self-pay

## 2017-12-18 ENCOUNTER — Emergency Department
Admission: EM | Admit: 2017-12-18 | Discharge: 2017-12-19 | Disposition: A | Discharge status: Home / Self Care | Payer: 59 | Attending: Emergency Medicine | Admitting: Emergency Medicine

## 2017-12-18 DIAGNOSIS — K5730 Diverticulosis of large intestine without perforation or abscess without bleeding: Secondary | ICD-10-CM | POA: Diagnosis not present

## 2017-12-18 DIAGNOSIS — K760 Fatty (change of) liver, not elsewhere classified: Secondary | ICD-10-CM | POA: Diagnosis not present

## 2017-12-18 DIAGNOSIS — K8020 Calculus of gallbladder without cholecystitis without obstruction: Secondary | ICD-10-CM | POA: Insufficient documentation

## 2017-12-18 DIAGNOSIS — R0600 Dyspnea, unspecified: Secondary | ICD-10-CM | POA: Diagnosis not present

## 2017-12-18 DIAGNOSIS — R1011 Right upper quadrant pain: Secondary | ICD-10-CM | POA: Diagnosis not present

## 2017-12-18 DIAGNOSIS — K5790 Diverticulosis of intestine, part unspecified, without perforation or abscess without bleeding: Secondary | ICD-10-CM | POA: Diagnosis not present

## 2017-12-18 DIAGNOSIS — R1013 Epigastric pain: Secondary | ICD-10-CM | POA: Diagnosis not present

## 2017-12-18 LAB — COMPREHENSIVE METABOLIC PANEL
ALBUMIN: 4.7 g/dL (ref 3.5–5.0)
ALT: 95 U/L — ABNORMAL HIGH (ref 0–44)
AST: 104 U/L — ABNORMAL HIGH (ref 15–41)
Alkaline Phosphatase: 83 U/L (ref 38–126)
Anion gap: 10 (ref 5–15)
BUN: 13 mg/dL (ref 6–20)
CHLORIDE: 99 mmol/L (ref 98–111)
CO2: 27 mmol/L (ref 22–32)
Calcium: 10.1 mg/dL (ref 8.9–10.3)
Creatinine, Ser: 0.67 mg/dL (ref 0.44–1.00)
Glucose, Bld: 207 mg/dL — ABNORMAL HIGH (ref 70–99)
POTASSIUM: 4.4 mmol/L (ref 3.5–5.1)
SODIUM: 136 mmol/L (ref 135–145)
Total Bilirubin: 1.5 mg/dL — ABNORMAL HIGH (ref 0.3–1.2)
Total Protein: 8.4 g/dL — ABNORMAL HIGH (ref 6.5–8.1)

## 2017-12-18 LAB — CBC
HEMATOCRIT: 48.2 % — AB (ref 35.0–47.0)
HEMOGLOBIN: 16.2 g/dL — AB (ref 12.0–16.0)
MCH: 32.4 pg (ref 26.0–34.0)
MCHC: 33.6 g/dL (ref 32.0–36.0)
MCV: 96.4 fL (ref 80.0–100.0)
Platelets: 216 10*3/uL (ref 150–440)
RBC: 5 MIL/uL (ref 3.80–5.20)
RDW: 13.1 % (ref 11.5–14.5)
WBC: 6.5 10*3/uL (ref 3.6–11.0)

## 2017-12-18 LAB — LIPASE, BLOOD: LIPASE: 41 U/L (ref 11–51)

## 2017-12-18 LAB — TROPONIN I: Troponin I: <0.03 ng/mL (ref <0.03)

## 2017-12-18 LAB — URINALYSIS, COMPLETE (UACMP) WITH MICROSCOPIC
BACTERIA UA: NONE SEEN
BILIRUBIN URINE: NEGATIVE
Glucose, UA: NEGATIVE mg/dL
Hgb urine dipstick: NEGATIVE
Ketones, ur: NEGATIVE mg/dL
LEUKOCYTES UA: NEGATIVE
Nitrite: NEGATIVE
PH: 5 (ref 5.0–8.0)
Protein, ur: NEGATIVE mg/dL
Specific Gravity, Urine: 1.015 (ref 1.005–1.030)

## 2017-12-18 LAB — PREGNANCY, URINE: PREG TEST UR: NEGATIVE

## 2017-12-18 MED ORDER — ONDANSETRON 4 MG PO TBDP
4.0000 mg | ORAL_TABLET | Freq: Once | ORAL | Status: AC | PRN
  Administered 2017-12-18: 4 mg via ORAL
  Filled 2017-12-18: qty 1

## 2017-12-18 NOTE — ED Triage Notes (Signed)
Pt arrives to ed via POV with complaints of right upper abdominal pain radiating into back. Ambulatory to triage. Pt states started having sharp pain around 3 pm today. Pt also states she has been having oily loose stool for around 1.5 weeks. No acute distress noted at this time

## 2017-12-19 ENCOUNTER — Encounter: Payer: Self-pay | Admitting: Radiology

## 2017-12-19 ENCOUNTER — Emergency Department: Payer: 59

## 2017-12-19 DIAGNOSIS — K760 Fatty (change of) liver, not elsewhere classified: Secondary | ICD-10-CM | POA: Diagnosis not present

## 2017-12-19 DIAGNOSIS — K5790 Diverticulosis of intestine, part unspecified, without perforation or abscess without bleeding: Secondary | ICD-10-CM | POA: Diagnosis not present

## 2017-12-19 DIAGNOSIS — K5730 Diverticulosis of large intestine without perforation or abscess without bleeding: Secondary | ICD-10-CM | POA: Diagnosis not present

## 2017-12-19 DIAGNOSIS — K8020 Calculus of gallbladder without cholecystitis without obstruction: Secondary | ICD-10-CM | POA: Diagnosis not present

## 2017-12-19 DIAGNOSIS — R1011 Right upper quadrant pain: Secondary | ICD-10-CM | POA: Diagnosis not present

## 2017-12-19 DIAGNOSIS — R0600 Dyspnea, unspecified: Secondary | ICD-10-CM | POA: Diagnosis not present

## 2017-12-19 MED ORDER — OXYCODONE-ACETAMINOPHEN 5-325 MG PO TABS: 1.0000 | ORAL_TABLET | ORAL | 0 refills | Status: DC | PRN

## 2017-12-19 MED ORDER — ONDANSETRON HCL 4 MG/2ML IJ SOLN
4.0000 mg | Freq: Once | INTRAMUSCULAR | Status: AC
  Administered 2017-12-19: 4 mg via INTRAVENOUS
  Filled 2017-12-19: qty 2

## 2017-12-19 MED ORDER — IOPAMIDOL (ISOVUE-300) INJECTION 61%
100.0000 mL | Freq: Once | INTRAVENOUS | Status: AC | PRN
  Administered 2017-12-19: 100 mL via INTRAVENOUS

## 2017-12-19 MED ORDER — MORPHINE SULFATE (PF) 4 MG/ML IV SOLN
4.0000 mg | Freq: Once | INTRAVENOUS | Status: AC
  Administered 2017-12-19: 4 mg via INTRAVENOUS
  Filled 2017-12-19: qty 1

## 2017-12-19 NOTE — ED Provider Notes (Signed)
[facility] Emergency Department Provider Note   First MD Initiated Contact with Patient 12/18/17 2327     (approximate)  I have reviewed the triage vital signs and the nursing notes.   HISTORY  Chief Complaint Abdominal Pain    HPI Marie [name] is a 52 y.o. female with below list of chronic medical conditions including alcohol abuse chronic pancreatitis presents to the emergency department with sharp epigastric abdominal pain with radiation to the right upper quadrant since 3 PM yesterday.  Patient also admits to a one-week history of "oily loose stools".  Patient denies any nausea or vomiting.  Patient denies any fever.  Patient denies any urinary symptoms.  Patient also admits to approximate 40 pound weight loss that was unintentional over the past "few months".  Past Medical History:  Diagnosis Date  . CHF (congestive heart failure) ([place])   . Chronic pancreatitis ([employer])   . Coronary atherosclerosis of unspecified type of vessel, native or graft   . MAUQJFHL(456.2)    "weekly" (07/29/2013)  . Hypertension   . Leiomyoma of uterus, unspecified   . Loss of weight   . Obesity, unspecified   . OSA (obstructive sleep apnea)    no CPAP  . Pure hypercholesterolemia   . Sarcoidosis   . Shortness of breath    none since stent 2 yrs ago  . Type II diabetes mellitus [facility])     Patient Active Problem List   Diagnosis Date Noted  . Malnutrition of moderate degree 08/30/2016  . SBO (small bowel obstruction) ([place]) 08/28/2016  . History of coronary artery bypass graft 08/28/2016  . COPD with chronic bronchitis ([place]) 08/28/2016  . Essential hypertension 08/28/2016  . Chronic pain 08/28/2016  . ETOH abuse 08/28/2016  . Abdominal pain   . COPD (chronic obstructive pulmonary disease) ([place]) 08/13/2013  . Hypoxemia 08/13/2013  . Syncope 07/29/2013  . Dehydration 07/29/2013  . Alcohol abuse, continuous 08/11/2012  . Upper abdominal pain 08/11/2012  .  Abnormal uterine bleeding 08/04/2012  . Fibroid uterus 08/04/2012  . Hypokalemia 03/24/2011  . Acute pancreatitis 03/22/2011  . BRONCHITIS, OBSTRUCTIVE CHRONIC 04/14/2010  . 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 WEIGHT LOSS 03/24/2010  . FIBROIDS, UTERUS 08/24/2009  . ACID REFLUX DISEASE 08/03/2009  . HYPERCHOLESTEROLEMIA 02/22/2009  . OBESITY 12/10/2008  . TOBACCO ABUSE 12/10/2008  . PANCREATITIS, CHRONIC 12/10/2008  . HEART DISEASE 12/02/2008  . OSA (obstructive sleep apnea) 12/02/2008  . Diabetes mellitus with complication ([place]) 56/38/9373  . CAD 09/25/1998    Past Surgical History:  Procedure Laterality Date  . CORONARY ANGIOPLASTY WITH STENT PLACEMENT  09/2009   "1"  . CORONARY ARTERY BYPASS GRAFT  ~ 2000   CABG X3  . HYSTEROSCOPY WITH NOVASURE N/A 09/04/2012   Procedure: HYSTEROSCOPY WITH NOVASURE;  Surgeon: Mora Bellman, MD;  Location: [facility];  Service: Gynecology;  Laterality: N/A;  with removal intrauterine device  . TUBAL LIGATION  1997    Prior to Admission medications   Medication Sig Start Date End Date Taking? Authorizing Provider  hydrochlorothiazide (HYDRODIURIL) 25 MG tablet Take 25 mg by mouth daily.  08/15/12   Oswald Hillock, MD  HYDROcodone-acetaminophen (NORCO/VICODIN) 5-325 MG tablet Take 1 tablet by mouth every 4 (four) hours as needed. 03/24/17   Harvest Dark, MD  lipase/protease/amylase (CREON) 12000 units CPEP capsule Take 1 capsule (12,000 Units total) by mouth 3 (three) times daily with meals. 09/01/16   Nita Sells, MD  metFORMIN (GLUCOPHAGE) 500 MG tablet Take 500  mg by mouth daily with breakfast. Take with food    [provider]  metoprolol (LOPRESSOR) 100 MG tablet Take 100 mg by mouth daily.  05/30/15   [provider]  oxyCODONE-acetaminophen (PERCOCET) 5-325 MG tablet Take 1 tablet by mouth every 4 (four) hours as needed for severe pain. 12/19/17 12/19/18  Gregor Hams, MD  pravastatin (PRAVACHOL) 20 MG tablet  Take 20 mg by mouth daily.     [provider]    Allergies Lisinopril  Family History  Problem Relation Age of Onset  . Diabetes Mother   . Coronary artery disease Mother   . Heart disease Mother   . Hyperlipidemia Mother   . Breast cancer Neg Hx     Social History Social History   Tobacco Use  . Smoking status: Current Every Day Smoker    Packs/day: 0.50    Years: 32.00    Pack years: 16.00    Types: Cigarettes  . Smokeless tobacco: Never Used  Substance Use Topics  . Alcohol use: Yes    Alcohol/week: 24.0 standard drinks    Types: 24 Glasses of wine per week    Comment: 08/28/2016 "3-4 glasses of wine qd; last glass was last night",  . Drug use: No    Review of Systems Constitutional: No fever/chills Eyes: No visual changes. ENT: No sore throat. Cardiovascular: Denies chest pain. Respiratory: Denies shortness of breath. Gastrointestinal: Positive for abdominal pain.  No nausea, no vomiting.  No diarrhea.  No constipation. Genitourinary: Negative for dysuria. Musculoskeletal: Negative for neck pain.  Negative for back pain. Integumentary: Negative for rash. Neurological: Negative for headaches, focal weakness or numbness.   ____________________________________________   PHYSICAL EXAM:  VITAL SIGNS: ED Triage Vitals  Enc Vitals Group     BP 12/18/17 1822 (!) 143/91     Pulse Rate 12/18/17 1822 84     Resp 12/19/17 0012 16     Temp 12/18/17 1822 98.5 F (36.9 C)     Temp Source 12/18/17 1822 Oral     SpO2 12/18/17 1822 99 %     Weight 12/18/17 1829 54.4 kg (120 lb)     Height 12/18/17 1829 1.575 m (5\' 2" )     Head Circumference --      Peak Flow --      Pain Score 12/18/17 1829 10     Pain Loc --      Pain Edu? --      Excl. in [place]? --     Constitutional: Alert and oriented. Well appearing and in no acute distress. Eyes: Conjunctivae are normal. PERRL. EOMI. Head: Atraumatic. Mouth/Throat: Mucous membranes are moist.  Oropharynx  non-erythematous. Neck: No stridor.  No meningeal signs.   Cardiovascular: Normal rate, regular rhythm. Good peripheral circulation. Grossly normal heart sounds. Respiratory: Normal respiratory effort.  No retractions. Lungs CTAB. Gastrointestinal: Right upper quadrant tenderness to palpation.. No distention.  Musculoskeletal: No lower extremity tenderness nor edema. No gross deformities of extremities. Neurologic:  Normal speech and language. No gross focal neurologic deficits are appreciated.  Skin:  Skin is warm, dry and intact. No rash noted. Psychiatric: Mood and affect are normal. Speech and behavior are normal.  ____________________________________________   LABS (all labs ordered are listed, but only abnormal results are displayed)  Labs Reviewed  COMPREHENSIVE METABOLIC PANEL - Abnormal; Notable for the following components:      Result Value   Glucose, Bld 207 (*)    Total Protein 8.4 (*)  AST 104 (*)    ALT 95 (*)    Total Bilirubin 1.5 (*)    All other components within normal limits  CBC - Abnormal; Notable for the following components:   Hemoglobin 16.2 (*)    HCT 48.2 (*)    All other components within normal limits  URINALYSIS, COMPLETE (UACMP) WITH MICROSCOPIC - Abnormal; Notable for the following components:   Color, Urine YELLOW (*)    APPearance CLEAR (*)    All other components within normal limits  LIPASE, BLOOD  PREGNANCY, URINE  TROPONIN I   ____________________________________________  EKG  ED ECG REPORT I, JAARS N Ilean Spradlin, the attending physician, personally viewed and interpreted this ECG.   Date: 12/19/2017  EKG Time: 6:37 PM  Rate: 79  Rhythm: Normal sinus rhythm  Axis: Normal  Intervals: Normal  ST&T Change: None  ____________________________________________  RADIOLOGY I, [place] N Shakeela Rabadan, personally viewed and evaluated these images (plain radiographs) as part of my medical decision making, as well as reviewing the written report  by the radiologist.  ED MD interpretation: No acute cardiopulmonary findings per radiologist on chest x-ray.  Sigmoid diverticulosis and evidence of chronic pancreatitis on CT abdomen pelvis on CT abdomen.  Right upper quadrant ultrasound revealed cholelithiasis.  Official radiology report(s): Dg Chest 2 View  Result Date: 12/19/2017 CLINICAL DATA:  Right upper abdominal pain radiating to the back. Cough and dyspnea. History of sarcoid. EXAM: CHEST - 2 VIEW COMPARISON:  08/29/2016 FINDINGS: Status post median sternotomy. Top-normal size heart with moderate aortic atherosclerosis. Chronic linear scarring in the upper lobes and left lung base. No significant change from prior. No pulmonary consolidations, effusions or pulmonary edema. No acute nor suspicious osseous abnormality. IMPRESSION: Likely remote sarcoid related chronic interstitial change and scarring of the lungs. No acute cardiopulmonary abnormality. Electronically Signed   By: Ashley Royalty M.D.   On: 12/19/2017 00:09   Ct Abdomen Pelvis W Contrast  Result Date: 12/19/2017 CLINICAL DATA:  52 year old female with abdominal pain. EXAM: CT ABDOMEN AND PELVIS WITH CONTRAST TECHNIQUE: Multidetector CT imaging of the abdomen and pelvis was performed using the standard protocol following bolus administration of intravenous contrast. CONTRAST:  132mL ISOVUE-300 IOPAMIDOL (ISOVUE-300) INJECTION 61% COMPARISON:  Abdominal ultrasound dated 12/18/2017 and CT dated 08/28/2016 FINDINGS: Lower chest: Focal area of linear and nodular density at the left lung base similar to prior CT most likely scarring. There is diffuse interstitial coarsening of the visualized lung bases. No intra-abdominal free air or free fluid. Hepatobiliary: Apparent fatty infiltration of the liver. No intrahepatic biliary ductal dilatation. The gallbladder is unremarkable. Pancreas: Extensive coarse calcification of the pancreas likely sequela of recurrent pancreatitis. No active  inflammatory changes identified. Correlation with pancreatic enzymes recommended. Spleen: Normal in size without focal abnormality. Adrenals/Urinary Tract: Adrenal glands are unremarkable. Kidneys are normal, without renal calculi, focal lesion, or hydronephrosis. Bladder is unremarkable. Stomach/Bowel: There is sigmoid diverticulosis with muscular hypertrophy. No active inflammatory changes. There is no bowel obstruction. Normal appendix. Vascular/Lymphatic: Moderate aortoiliac atherosclerotic disease. No portal venous gas. There is no adenopathy. Reproductive: Enlarged myomatous uterus. The ovaries are grossly unremarkable as visualized. Other: None Musculoskeletal: No acute or significant osseous findings. IMPRESSION: 1. Sigmoid diverticulosis. No bowel obstruction or active inflammation. Normal appendix. 2. Mild fatty liver. 3. Sequela of chronic pancreatitis. Electronically Signed   By: Anner Crete M.D.   On: 12/19/2017 01:02   US Abdomen Limited Ruq  Result Date: 12/19/2017 CLINICAL DATA:  Right upper quadrant pain radiating to the  back x8 hours. EXAM: ULTRASOUND ABDOMEN LIMITED RIGHT UPPER QUADRANT COMPARISON:  03/24/2017 FINDINGS: Gallbladder: Nondistended gallbladder with a few layering calculi noted within, the largest approximately 3.5 mm. No pericholecystic fluid. No sonographic Murphy sign noted by sonographer. Common bile duct: Diameter: 5 mm Liver: No focal lesion identified. Within normal limits in parenchymal echogenicity. Portal vein is patent on color Doppler imaging with normal direction of blood flow towards the liver. IMPRESSION: Uncomplicated cholelithiasis. Electronically Signed   By: Ashley Royalty M.D.   On: 12/19/2017 00:18     Procedures   ____________________________________________   INITIAL IMPRESSION / ASSESSMENT AND PLAN / ED COURSE  As part of my medical decision making, I reviewed the following data within the electronic MEDICAL RECORD NUMBER  52 year old female  presenting with above-stated history and physical exam secondary to epigastric/right upper quadrant pain.  Concern for possible cholelithiasis versus cholecystitis and as such ultrasound was performed which revealed evidence of cholelithiasis.  Given patient's marketed weight loss that was unintentional CT scan of the abdomen performed to evaluate for possible carcinoma however only sigmoid diverticulosis and chronic pancreatitis was noted.  Patient was given IV morphine in the emergency department with pain resolution.  Patient will be prescribed Percocet for home with recommendation to follow-up with general surgery Dr. Dahlia Byes __________________________________________  FINAL CLINICAL IMPRESSION(S) / ED DIAGNOSES  Final diagnoses:  Calculus of gallbladder without cholecystitis without obstruction  Diverticulosis     MEDICATIONS GIVEN DURING THIS VISIT:  Medications  ondansetron (ZOFRAN-ODT) disintegrating tablet 4 mg (4 mg Oral Given 12/18/17 1837)  iopamidol (ISOVUE-300) 61 % injection 100 mL (100 mLs Intravenous Contrast Given 12/19/17 0041)  morphine 4 MG/ML injection 4 mg (4 mg Intravenous Given 12/19/17 0037)  ondansetron (ZOFRAN) injection 4 mg (4 mg Intravenous Given 12/19/17 0037)     ED Discharge Orders         Ordered    oxyCODONE-acetaminophen (PERCOCET) 5-325 MG tablet  Every 4 hours PRN     12/19/17 0150           Note:  This document was prepared using Dragon voice recognition software and may include unintentional dictation errors.    Gregor Hams, MD 12/19/17 [id number]

## 2017-12-23 ENCOUNTER — Ambulatory Visit (INDEPENDENT_AMBULATORY_CARE_PROVIDER_SITE_OTHER): Payer: 59 | Admitting: Surgery

## 2017-12-23 ENCOUNTER — Encounter: Payer: Self-pay | Admitting: Surgery

## 2017-12-23 VITALS — BP 147/86 | HR 85 | Temp 97.9°F | Ht 62.0 in | Wt 122.0 lb

## 2017-12-23 DIAGNOSIS — K8020 Calculus of gallbladder without cholecystitis without obstruction: Secondary | ICD-10-CM | POA: Diagnosis not present

## 2017-12-23 NOTE — Progress Notes (Signed)
Patient ID: Marie [name], female   DOB: May 28, 1965, 52 y.o.   MRN: [id number]  HPI Marie [name] is a 52 y.o. female recently seen in the emergency room for biliary colic.  She has multiple comorbidities including chronic pancreatitis, diabetes and she does have daily alcohol abuse.  She reports that since a week or so she has been having some intermittent right upper quadrant pain.  She reports that now her stool is oily.  There is no fevers no chills.  The pain is sharp and moderate in nature.  No specific alleviating or aggravating factors.  She does report some weight loss over the last few months. No evidence of cholangitis or jaundice. He had a CT scan of the abdomen and pelvis as well as an ultrasound that I have personally reviewed.  There is evidence of cholelithiasis.  There is no cholecystitis there is normal common bile duct.  There are changes consistent with chronic pancreatitis.  HPI  Past Medical History:  Diagnosis Date  . CHF (congestive heart failure) ([place])   . Chronic pancreatitis ([place])   . Coronary atherosclerosis of unspecified type of vessel, native or graft   . WUJWJXBJ(478.2)    "weekly" (07/29/2013)  . Hypertension   . Leiomyoma of uterus, unspecified   . Loss of weight   . Obesity, unspecified   . OSA (obstructive sleep apnea)    no CPAP  . Pure hypercholesterolemia   . Sarcoidosis   . Shortness of breath    none since stent 2 yrs ago  . Type II diabetes mellitus ([place])     Past Surgical History:  Procedure Laterality Date  . CORONARY ANGIOPLASTY WITH STENT PLACEMENT  09/2009   "1"  . CORONARY ARTERY BYPASS GRAFT  ~ 2000   CABG X3  . HYSTEROSCOPY WITH NOVASURE N/A 09/04/2012   Procedure: HYSTEROSCOPY WITH NOVASURE;  Surgeon: Mora Bellman, MD;  Location: [facility];  Service: Gynecology;  Laterality: N/A;  with removal intrauterine device  . TUBAL LIGATION  1997    Family History  Problem Relation Age of Onset  . Diabetes Mother   . Coronary artery  disease Mother   . Heart disease Mother   . Hyperlipidemia Mother   . Breast cancer Neg Hx     Social History Social History   Tobacco Use  . Smoking status: Current Every Day Smoker    Packs/day: 0.50    Years: 32.00    Pack years: 16.00    Types: Cigarettes  . Smokeless tobacco: Never Used  Substance Use Topics  . Alcohol use: Yes    Alcohol/week: 24.0 standard drinks    Types: 24 Glasses of wine per week    Comment: 08/28/2016 "3-4 glasses of wine qd; last glass was last night",  . Drug use: No    Allergies  Allergen Reactions  . Lisinopril Hives    Current Outpatient Medications  Medication Sig Dispense Refill  . hydrochlorothiazide (HYDRODIURIL) 25 MG tablet Take 25 mg by mouth daily.     Marland Kitchen HYDROcodone-acetaminophen (NORCO/VICODIN) 5-325 MG tablet Take 1 tablet by mouth every 4 (four) hours as needed. 8 tablet 0  . lipase/protease/amylase (CREON) 12000 units CPEP capsule Take 1 capsule (12,000 Units total) by mouth 3 (three) times daily with meals. 270 capsule 3  . metFORMIN (GLUCOPHAGE) 500 MG tablet Take 500 mg by mouth daily with breakfast. Take with food    . metoprolol (LOPRESSOR) 100 MG tablet Take 100 mg by mouth daily.  0  . oxyCODONE-acetaminophen (PERCOCET) 5-325 MG tablet Take 1 tablet by mouth every 4 (four) hours as needed for severe pain. 20 tablet 0  . pravastatin (PRAVACHOL) 20 MG tablet Take 20 mg by mouth daily.      No current facility-administered medications for this visit.      Review of Systems Full ROS  was asked and was negative except for the information on the HPI  Physical Exam Blood pressure (!) 147/86, pulse 85, temperature 97.9 F (36.6 C), temperature source Skin, height 5\' 2"  (1.575 m), weight 122 lb (55.3 kg). CONSTITUTIONAL: NAD EYES: Pupils are equal, round, and reactive to light, Sclera are non-icteric. EARS, NOSE, MOUTH AND THROAT: The oropharynx is clear. The oral mucosa is pink and moist. Hearing is intact to voice. LYMPH  NODES:  Lymph nodes in the neck are normal. RESPIRATORY:  Lungs are clear. There is normal respiratory effort, with equal breath sounds bilaterally, and without pathologic use of accessory muscles. CARDIOVASCULAR: Heart is regular without murmurs, gallops, or rubs. GI: The abdomen is  soft, nontender, and nondistended. There are no palpable masses. There is no hepatosplenomegaly. There are normal bowel sounds in all quadrants.  There is no ascites GU: Rectal deferred.   MUSCULOSKELETAL: Normal muscle strength and tone. No cyanosis or edema.   SKIN: Turgor is good and there are no pathologic skin lesions or ulcers. NEUROLOGIC: Motor and sensation is grossly normal. Cranial nerves are grossly intact. PSYCH:  Oriented to person, place and time. Affect is normal.  Data Reviewed  I have personally reviewed the patient's imaging, laboratory findings and medical records.    Assessment/Plan 52 year old female with signs consistent with biliary colic.  I had a lengthy discussion with the patient regarding the potential also this being caused by her chronic pancreatitis.  She is adamant that these symptoms are different.  Discussed with the patient in detail.  I do not see any contraindication for cholecystectomy. I Do think that she will be a good candidate for robotic assisted cholecystectomy.The risks, benefits, complications, treatment options, and expected outcomes were discussed with the patient. The possibilities of bleeding, recurrent infection, finding a normal gallbladder, perforation of viscus organs, damage to surrounding structures, bile leak, abscess formation, needing a drain placed, the need for additional procedures, reaction to medication, pulmonary aspiration,  failure to diagnose a condition, the possible need to convert to an open procedure, and creating a complication requiring transfusion or operation were discussed with the patient. The patient and/or family concurred with the proposed  plan, giving informed consent.   Caroleen Hamman, MD FACS General Surgeon 12/23/2017, 1:01 PM

## 2017-12-23 NOTE — H&P (View-Only) (Signed)
Patient ID: Marie [name], female   DOB: 1965/04/30, 52 y.o.   MRN: [id number]  HPI Marie [name] is a 52 y.o. female recently seen in the emergency room for biliary colic.  She has multiple comorbidities including chronic pancreatitis, diabetes and she does have daily alcohol abuse.  She reports that since a week or so she has been having some intermittent right upper quadrant pain.  She reports that now her stool is oily.  There is no fevers no chills.  The pain is sharp and moderate in nature.  No specific alleviating or aggravating factors.  She does report some weight loss over the last few months. No evidence of cholangitis or jaundice. He had a CT scan of the abdomen and pelvis as well as an ultrasound that I have personally reviewed.  There is evidence of cholelithiasis.  There is no cholecystitis there is normal common bile duct.  There are changes consistent with chronic pancreatitis.  HPI  Past Medical History:  Diagnosis Date  . CHF (congestive heart failure) ([place])   . Chronic pancreatitis ([place])   . Coronary atherosclerosis of unspecified type of vessel, native or graft   . YWVPXTGG(269.4)    "weekly" (07/29/2013)  . Hypertension   . Leiomyoma of uterus, unspecified   . Loss of weight   . Obesity, unspecified   . OSA (obstructive sleep apnea)    no CPAP  . Pure hypercholesterolemia   . Sarcoidosis   . Shortness of breath    none since stent 2 yrs ago  . Type II diabetes mellitus ([place])     Past Surgical History:  Procedure Laterality Date  . CORONARY ANGIOPLASTY WITH STENT PLACEMENT  09/2009   "1"  . CORONARY ARTERY BYPASS GRAFT  ~ 2000   CABG X3  . HYSTEROSCOPY WITH NOVASURE N/A 09/04/2012   Procedure: HYSTEROSCOPY WITH NOVASURE;  Surgeon: Mora Bellman, MD;  Location: [facility];  Service: Gynecology;  Laterality: N/A;  with removal intrauterine device  . TUBAL LIGATION  1997    Family History  Problem Relation Age of Onset  . Diabetes Mother   . Coronary artery  disease Mother   . Heart disease Mother   . Hyperlipidemia Mother   . Breast cancer Neg Hx     Social History Social History   Tobacco Use  . Smoking status: Current Every Day Smoker    Packs/day: 0.50    Years: 32.00    Pack years: 16.00    Types: Cigarettes  . Smokeless tobacco: Never Used  Substance Use Topics  . Alcohol use: Yes    Alcohol/week: 24.0 standard drinks    Types: 24 Glasses of wine per week    Comment: 08/28/2016 "3-4 glasses of wine qd; last glass was last night",  . Drug use: No    Allergies  Allergen Reactions  . Lisinopril Hives    Current Outpatient Medications  Medication Sig Dispense Refill  . hydrochlorothiazide (HYDRODIURIL) 25 MG tablet Take 25 mg by mouth daily.     Marland Kitchen HYDROcodone-acetaminophen (NORCO/VICODIN) 5-325 MG tablet Take 1 tablet by mouth every 4 (four) hours as needed. 8 tablet 0  . lipase/protease/amylase (CREON) 12000 units CPEP capsule Take 1 capsule (12,000 Units total) by mouth 3 (three) times daily with meals. 270 capsule 3  . metFORMIN (GLUCOPHAGE) 500 MG tablet Take 500 mg by mouth daily with breakfast. Take with food    . metoprolol (LOPRESSOR) 100 MG tablet Take 100 mg by mouth daily.  0  . oxyCODONE-acetaminophen (PERCOCET) 5-325 MG tablet Take 1 tablet by mouth every 4 (four) hours as needed for severe pain. 20 tablet 0  . pravastatin (PRAVACHOL) 20 MG tablet Take 20 mg by mouth daily.      No current facility-administered medications for this visit.      Review of Systems Full ROS  was asked and was negative except for the information on the HPI  Physical Exam Blood pressure (!) 147/86, pulse 85, temperature 97.9 F (36.6 C), temperature source Skin, height 5\' 2"  (1.575 m), weight 122 lb (55.3 kg). CONSTITUTIONAL: NAD EYES: Pupils are equal, round, and reactive to light, Sclera are non-icteric. EARS, NOSE, MOUTH AND THROAT: The oropharynx is clear. The oral mucosa is pink and moist. Hearing is intact to voice. LYMPH  NODES:  Lymph nodes in the neck are normal. RESPIRATORY:  Lungs are clear. There is normal respiratory effort, with equal breath sounds bilaterally, and without pathologic use of accessory muscles. CARDIOVASCULAR: Heart is regular without murmurs, gallops, or rubs. GI: The abdomen is  soft, nontender, and nondistended. There are no palpable masses. There is no hepatosplenomegaly. There are normal bowel sounds in all quadrants.  There is no ascites GU: Rectal deferred.   MUSCULOSKELETAL: Normal muscle strength and tone. No cyanosis or edema.   SKIN: Turgor is good and there are no pathologic skin lesions or ulcers. NEUROLOGIC: Motor and sensation is grossly normal. Cranial nerves are grossly intact. PSYCH:  Oriented to person, place and time. Affect is normal.  Data Reviewed  I have personally reviewed the patient's imaging, laboratory findings and medical records.    Assessment/Plan 52 year old female with signs consistent with biliary colic.  I had a lengthy discussion with the patient regarding the potential also this being caused by her chronic pancreatitis.  She is adamant that these symptoms are different.  Discussed with the patient in detail.  I do not see any contraindication for cholecystectomy. I Do think that she will be a good candidate for robotic assisted cholecystectomy.The risks, benefits, complications, treatment options, and expected outcomes were discussed with the patient. The possibilities of bleeding, recurrent infection, finding a normal gallbladder, perforation of viscus organs, damage to surrounding structures, bile leak, abscess formation, needing a drain placed, the need for additional procedures, reaction to medication, pulmonary aspiration,  failure to diagnose a condition, the possible need to convert to an open procedure, and creating a complication requiring transfusion or operation were discussed with the patient. The patient and/or family concurred with the proposed  plan, giving informed consent.   Caroleen Hamman, MD FACS General Surgeon 12/23/2017, 1:01 PM

## 2017-12-23 NOTE — Patient Instructions (Addendum)

## 2017-12-25 ENCOUNTER — Telehealth: Payer: Self-pay

## 2017-12-25 NOTE — Telephone Encounter (Signed)
Patient notified of date and time.  

## 2017-12-25 NOTE — Telephone Encounter (Signed)
The patient is scheduled for Pre Admit testing on 01/06/18 at 8:00 am in the [place] at [facility]. Unable to leave a message for the patient as her voicemail is full.

## 2018-01-06 ENCOUNTER — Encounter: Payer: Self-pay | Admitting: *Deleted

## 2018-01-06 ENCOUNTER — Other Ambulatory Visit: Payer: Self-pay

## 2018-01-06 ENCOUNTER — Encounter
Admission: RE | Admit: 2018-01-06 | Discharge: 2018-01-06 | Disposition: A | Discharge status: Home / Self Care | Payer: 59 | Source: Ambulatory Visit | Attending: Surgery | Admitting: Surgery

## 2018-01-06 ENCOUNTER — Other Ambulatory Visit: Payer: 59

## 2018-01-06 DIAGNOSIS — Z01812 Encounter for preprocedural laboratory examination: Secondary | ICD-10-CM | POA: Insufficient documentation

## 2018-01-06 LAB — COMPREHENSIVE METABOLIC PANEL
ALBUMIN: 3.7 g/dL (ref 3.5–5.0)
ALK PHOS: 71 U/L (ref 38–126)
ALT: 78 U/L — ABNORMAL HIGH (ref 0–44)
AST: 126 U/L — AB (ref 15–41)
Anion gap: 11 (ref 5–15)
BILIRUBIN TOTAL: 0.9 mg/dL (ref 0.3–1.2)
BUN: 11 mg/dL (ref 6–20)
CALCIUM: 9.3 mg/dL (ref 8.9–10.3)
CO2: 25 mmol/L (ref 22–32)
Chloride: 105 mmol/L (ref 98–111)
Creatinine, Ser: 0.67 mg/dL (ref 0.44–1.00)
GFR calc Af Amer: >60 mL/min (ref >60)
GFR calc non Af Amer: >60 mL/min (ref >60)
GLUCOSE: 205 mg/dL — AB (ref 70–99)
Potassium: 4 mmol/L (ref 3.5–5.1)
Sodium: 141 mmol/L (ref 135–145)
TOTAL PROTEIN: 7.4 g/dL (ref 6.5–8.1)

## 2018-01-06 NOTE — Patient Instructions (Addendum)
  Your procedure is scheduled on: Tuesday January 14, 2018 Report to Same Day Surgery 2nd floor [facility] Entrance-take elevator on left to 2nd floor.  Check in with surgery information desk.) To find out your arrival time, call [phone] 1:00-3:00 PM on Monday January 13, 2018  Remember: Instructions that are not followed completely may result in serious medical risk, up to and including death, or upon the discretion of your surgeon and anesthesiologist your surgery may need to be rescheduled.    __x__ 1. Do not eat food (including mints, candies, chewing gum) after midnight the night before your procedure. You may drink water up to 2 hours before you are scheduled to arrive at the hospital for your procedure.  Do not drink anything within 2 hours of your scheduled arrival to the hospital.    __x__ 2. No Alcohol for 24 hours before or after surgery.   __x__ 3. No Smoking or e-cigarettes for 24 hours before surgery.  Do not use any chewable tobacco products for at least 6 hours before surgery.   __x__ 4. Notify your doctor if there is any change in your medical condition (cold, fever, infections).   __x__ 5. On the morning of surgery brush your teeth with toothpaste and water.  You may rinse your mouth with mouthwash if you wish.  Do not swallow any toothpaste or mouthwash.  Please read over the following fact sheets that you were given:   [facility] Preparing for Surgery and/or MRSA Information    __x__ Use CHG Soap or Sage wipes as directed on instruction sheet    Do not wear jewelry, make-up, hairpins, clips or nail polish on the day of surgery.  Do not wear lotions, powders, deodorant, or perfumes.   Do not shave below the face/neck 48 hours prior to surgery.   Do not bring valuables to the hospital.    [facility] is not responsible for any belongings or valuables.               Contacts, dentures or bridgework may not be worn into surgery.  For patients  discharged on the day of surgery, you will NOT be permitted to drive yourself home.  You must have a responsible adult with you for 24 hours after surgery.      __x__ Take anti-hypertensive listed below, cardiac, seizure, asthma, anti-reflux and psychiatric medicines on the morning of surgery with a small sip of water. These include:  1. Metoprolol (Lopressor)  2. Pravastatin (Pravachol)  3. Hydrocodone (Vicodin) if needed for pain  Do NOT Losartan (Cozaar) or HCTZ (Hydrodiuril) on the morning of surgery.  There is no need to stop these medicines prior to the day of surgery.  __x__ Stop Metformin 2 days before surgery (Last dose Saturday January 11, 2018).    __x__ Follow recommendations from Cardiologist, Pulmonologist or PCP regarding stopping Plavix, Aspirin, Coumadin, Eliquis, Effient, Pradaxa, and Pletal.  Guideline is to stop 7 days prior to surgery (Last dose today).  __x__ TODAY: Stop Anti-inflammatories such as Advil, Ibuprofen, Motrin, Aleve, Naproxen, Naprosyn, BC/Goodies powders or aspirin products. You may continue to take Tylenol and Celebrex.   __x__ TODAY: Stop supplements until after surgery. You may continue to take Vitamin D, Vitamin B, and multivitamin.

## 2018-01-06 NOTE — Progress Notes (Signed)
Per Earlie Server, RN in Pre-admission Testing, patient will require medical clearance from Dr. Raphael Gibney office prior to gallbladder surgery which is scheduled for 01-14-18 with Dr. Dahlia Byes.  A message has been left for Dr. Raphael Gibney nurse to see if patient will be required to come in for an appointment or if she can be cleared off of last visit in June.   Surgery pending medical clearance.

## 2018-01-06 NOTE — Pre-Procedure Instructions (Signed)
Dr. Andree Elk (anesthesiology) consulted re: CMP results, chronic pancreatitis, EtOH use.  Per Dr. Andree Elk, medical clearance requested from PCP Dr. Marisue Humble.  This RN spoke w/ Santiago Glad @ Dr. Andrew Au office re: upcoming surgery.  Last visit June 2019.  Per Santiago Glad, clearance faxed to secondary fax # [phone] d/t fax machine down.  CMP results faxed to Dr. Corlis Leak office, [place] re: medical clearance request.

## 2018-01-06 NOTE — Pre-Procedure Instructions (Signed)
Fax to Dr. Andrew Au office failed to primary and secondary fax numbers.

## 2018-01-07 NOTE — Pre-Procedure Instructions (Signed)
REFAXED TO [phone], DR Marisue Humble AND VERIFIED FAX RECEIVED  FOR MEDICAL CLEARANCE

## 2018-01-08 ENCOUNTER — Telehealth: Payer: Self-pay | Admitting: *Deleted

## 2018-01-08 NOTE — Telephone Encounter (Signed)
Patient called Dr. Andrew Au office and has a nurse visit scheduled for tomorrow, 01-09-18 at 2 pm.

## 2018-01-08 NOTE — Telephone Encounter (Signed)
Message left on cell and work number for patient to call the office.   Patient needs to medical clearance prior to surgery scheduled with Dr. Dahlia Byes for 01-14-18.  Per Dr. Andrew Au office, patient will need to call them to schedule an appointment for a nurse visit to have an EKG. Their office called patient and left a message to try and schedule an appointment but the patient has not called their office back.   Surgery pending medical clearance.

## 2018-01-09 DIAGNOSIS — Z01810 Encounter for preprocedural cardiovascular examination: Secondary | ICD-10-CM | POA: Diagnosis not present

## 2018-01-09 DIAGNOSIS — K8020 Calculus of gallbladder without cholecystitis without obstruction: Secondary | ICD-10-CM | POA: Diagnosis not present

## 2018-01-13 DIAGNOSIS — J449 Chronic obstructive pulmonary disease, unspecified: Secondary | ICD-10-CM | POA: Diagnosis not present

## 2018-01-13 MED ORDER — CEFAZOLIN SODIUM-DEXTROSE 2-4 GM/100ML-% IV SOLN
2.0000 g | INTRAVENOUS | Status: AC
  Administered 2018-01-14: 2 g via INTRAVENOUS

## 2018-01-13 NOTE — Telephone Encounter (Signed)
We have received medical clearance from Dr. Andrew Au office.   A copy of clearance has been forwarded to Same Day Surgery and received per [facility].   Surgery as scheduled for 01-14-18 with Dr. Dahlia Byes.   Patient aware.

## 2018-01-14 ENCOUNTER — Ambulatory Visit: Payer: 59 | Admitting: Certified Registered Nurse Anesthetist

## 2018-01-14 ENCOUNTER — Encounter: Payer: Self-pay | Admitting: *Deleted

## 2018-01-14 ENCOUNTER — Encounter
Admission: RE | Disposition: A | Discharge status: Home / Self Care | Payer: Self-pay | Source: Ambulatory Visit | Attending: Surgery

## 2018-01-14 ENCOUNTER — Ambulatory Visit
Admission: RE | Admit: 2018-01-14 | Discharge: 2018-01-14 | Disposition: A | Discharge status: Home / Self Care | Payer: 59 | Source: Ambulatory Visit | Attending: Surgery | Admitting: Surgery

## 2018-01-14 ENCOUNTER — Other Ambulatory Visit: Payer: Self-pay

## 2018-01-14 DIAGNOSIS — I509 Heart failure, unspecified: Secondary | ICD-10-CM | POA: Insufficient documentation

## 2018-01-14 DIAGNOSIS — E119 Type 2 diabetes mellitus without complications: Secondary | ICD-10-CM | POA: Insufficient documentation

## 2018-01-14 DIAGNOSIS — I2510 Atherosclerotic heart disease of native coronary artery without angina pectoris: Secondary | ICD-10-CM | POA: Insufficient documentation

## 2018-01-14 DIAGNOSIS — Z79899 Other long term (current) drug therapy: Secondary | ICD-10-CM | POA: Diagnosis not present

## 2018-01-14 DIAGNOSIS — K811 Chronic cholecystitis: Secondary | ICD-10-CM | POA: Insufficient documentation

## 2018-01-14 DIAGNOSIS — K8020 Calculus of gallbladder without cholecystitis without obstruction: Secondary | ICD-10-CM | POA: Diagnosis not present

## 2018-01-14 DIAGNOSIS — D869 Sarcoidosis, unspecified: Secondary | ICD-10-CM | POA: Diagnosis not present

## 2018-01-14 DIAGNOSIS — E7800 Pure hypercholesterolemia, unspecified: Secondary | ICD-10-CM | POA: Diagnosis not present

## 2018-01-14 DIAGNOSIS — K861 Other chronic pancreatitis: Secondary | ICD-10-CM | POA: Diagnosis not present

## 2018-01-14 DIAGNOSIS — G4733 Obstructive sleep apnea (adult) (pediatric): Secondary | ICD-10-CM | POA: Diagnosis not present

## 2018-01-14 DIAGNOSIS — Z7984 Long term (current) use of oral hypoglycemic drugs: Secondary | ICD-10-CM | POA: Insufficient documentation

## 2018-01-14 DIAGNOSIS — Z951 Presence of aortocoronary bypass graft: Secondary | ICD-10-CM | POA: Insufficient documentation

## 2018-01-14 DIAGNOSIS — K8050 Calculus of bile duct without cholangitis or cholecystitis without obstruction: Secondary | ICD-10-CM | POA: Diagnosis present

## 2018-01-14 DIAGNOSIS — I110 Hypertensive heart disease with heart failure: Secondary | ICD-10-CM | POA: Diagnosis not present

## 2018-01-14 DIAGNOSIS — F1010 Alcohol abuse, uncomplicated: Secondary | ICD-10-CM | POA: Insufficient documentation

## 2018-01-14 DIAGNOSIS — F17210 Nicotine dependence, cigarettes, uncomplicated: Secondary | ICD-10-CM | POA: Diagnosis not present

## 2018-01-14 DIAGNOSIS — J449 Chronic obstructive pulmonary disease, unspecified: Secondary | ICD-10-CM | POA: Insufficient documentation

## 2018-01-14 HISTORY — PX: ROBOTIC ASSISTED LAPAROSCOPIC CHOLECYSTECTOMY-MULTI SITE: SHX6603

## 2018-01-14 LAB — GLUCOSE, CAPILLARY
Glucose-Capillary: 81 mg/dL (ref 70–99)
Glucose-Capillary: 107 mg/dL — ABNORMAL HIGH (ref 70–99)

## 2018-01-14 LAB — POCT PREGNANCY, URINE: PREG TEST UR: NEGATIVE

## 2018-01-14 SURGERY — ROBOTIC ASSISTED LAPAROSCOPIC CHOLECYSTECTOMY-MULTI SITE
Anesthesia: General | Site: Abdomen

## 2018-01-14 MED ORDER — FAMOTIDINE 20 MG PO TABS
20.0000 mg | ORAL_TABLET | Freq: Once | ORAL | Status: AC
  Administered 2018-01-14: 20 mg via ORAL

## 2018-01-14 MED ORDER — HYDROCODONE-ACETAMINOPHEN 5-325 MG PO TABS: 1.0000 | ORAL_TABLET | Freq: Four times a day (QID) | ORAL | 0 refills | Status: DC | PRN

## 2018-01-14 MED ORDER — BUPIVACAINE-EPINEPHRINE (PF) 0.25% -1:200000 IJ SOLN
INTRAMUSCULAR | Status: AC
  Filled 2018-01-14: qty 30

## 2018-01-14 MED ORDER — MIDAZOLAM HCL 2 MG/2ML IJ SOLN
INTRAMUSCULAR | Status: DC | PRN
  Administered 2018-01-14: 2 mg via INTRAVENOUS

## 2018-01-14 MED ORDER — DEXAMETHASONE SODIUM PHOSPHATE 10 MG/ML IJ SOLN
INTRAMUSCULAR | Status: AC
  Filled 2018-01-14: qty 1

## 2018-01-14 MED ORDER — FENTANYL CITRATE (PF) 100 MCG/2ML IJ SOLN
INTRAMUSCULAR | Status: AC
  Filled 2018-01-14: qty 2

## 2018-01-14 MED ORDER — MEPERIDINE HCL 50 MG/ML IJ SOLN: 6.2500 mg | INTRAMUSCULAR | Status: DC | PRN

## 2018-01-14 MED ORDER — BUPIVACAINE HCL (PF) 0.25 % IJ SOLN
INTRAMUSCULAR | Status: AC
  Filled 2018-01-14: qty 30

## 2018-01-14 MED ORDER — CEFAZOLIN SODIUM-DEXTROSE 2-4 GM/100ML-% IV SOLN
INTRAVENOUS | Status: AC
  Filled 2018-01-14: qty 100

## 2018-01-14 MED ORDER — SODIUM CHLORIDE 0.9 % IV SOLN
INTRAVENOUS | Status: DC
  Administered 2018-01-14: 07:00:00 via INTRAVENOUS

## 2018-01-14 MED ORDER — BUPIVACAINE-EPINEPHRINE 0.25% -1:200000 IJ SOLN
INTRAMUSCULAR | Status: DC | PRN
  Administered 2018-01-14: 30 mL

## 2018-01-14 MED ORDER — PROPOFOL 10 MG/ML IV BOLUS
INTRAVENOUS | Status: DC | PRN
  Administered 2018-01-14: 120 mg via INTRAVENOUS

## 2018-01-14 MED ORDER — PHENYLEPHRINE HCL 10 MG/ML IJ SOLN
INTRAMUSCULAR | Status: AC
  Filled 2018-01-14: qty 1

## 2018-01-14 MED ORDER — PROPOFOL 10 MG/ML IV BOLUS
INTRAVENOUS | Status: AC
  Filled 2018-01-14: qty 20

## 2018-01-14 MED ORDER — METOPROLOL TARTRATE 50 MG PO TABS
ORAL_TABLET | ORAL | Status: AC
  Administered 2018-01-14: 100 mg via ORAL
  Filled 2018-01-14: qty 2

## 2018-01-14 MED ORDER — KETOROLAC TROMETHAMINE 30 MG/ML IJ SOLN
INTRAMUSCULAR | Status: DC | PRN
  Administered 2018-01-14: 15 mg via INTRAVENOUS

## 2018-01-14 MED ORDER — HYDROCODONE-ACETAMINOPHEN 5-325 MG PO TABS
1.0000 | ORAL_TABLET | Freq: Once | ORAL | Status: AC
  Administered 2018-01-14: 1 via ORAL

## 2018-01-14 MED ORDER — ROCURONIUM BROMIDE 50 MG/5ML IV SOLN
INTRAVENOUS | Status: AC
  Filled 2018-01-14: qty 1

## 2018-01-14 MED ORDER — EPHEDRINE SULFATE 50 MG/ML IJ SOLN
INTRAMUSCULAR | Status: DC | PRN
  Administered 2018-01-14: 15 mg via INTRAVENOUS

## 2018-01-14 MED ORDER — OXYCODONE HCL 5 MG/5ML PO SOLN: 5.0000 mg | Freq: Once | ORAL | Status: DC | PRN

## 2018-01-14 MED ORDER — FENTANYL CITRATE (PF) 100 MCG/2ML IJ SOLN
25.0000 ug | INTRAMUSCULAR | Status: AC | PRN
  Administered 2018-01-14 (×6): 25 ug via INTRAVENOUS

## 2018-01-14 MED ORDER — METOPROLOL TARTRATE 50 MG PO TABS
100.0000 mg | ORAL_TABLET | Freq: Once | ORAL | Status: AC
  Administered 2018-01-14: 100 mg via ORAL

## 2018-01-14 MED ORDER — DEXAMETHASONE SODIUM PHOSPHATE 10 MG/ML IJ SOLN
INTRAMUSCULAR | Status: DC | PRN
  Administered 2018-01-14: 5 mg via INTRAVENOUS

## 2018-01-14 MED ORDER — OXYCODONE HCL 5 MG PO TABS: 5.0000 mg | ORAL_TABLET | Freq: Once | ORAL | Status: DC | PRN

## 2018-01-14 MED ORDER — MIDAZOLAM HCL 2 MG/2ML IJ SOLN
INTRAMUSCULAR | Status: AC
  Filled 2018-01-14: qty 2

## 2018-01-14 MED ORDER — PHENYLEPHRINE HCL 10 MG/ML IJ SOLN
INTRAMUSCULAR | Status: DC | PRN
  Administered 2018-01-14 (×3): 100 ug via INTRAVENOUS

## 2018-01-14 MED ORDER — INDOCYANINE GREEN 25 MG IV SOLR
2.5000 mg | Freq: Once | INTRAVENOUS | Status: DC
  Filled 2018-01-14: qty 3

## 2018-01-14 MED ORDER — SUGAMMADEX SODIUM 200 MG/2ML IV SOLN
INTRAVENOUS | Status: DC | PRN
  Administered 2018-01-14: 200 mg via INTRAVENOUS

## 2018-01-14 MED ORDER — EPHEDRINE SULFATE 50 MG/ML IJ SOLN
INTRAMUSCULAR | Status: AC
  Filled 2018-01-14: qty 1

## 2018-01-14 MED ORDER — ONDANSETRON HCL 4 MG/2ML IJ SOLN
INTRAMUSCULAR | Status: DC | PRN
  Administered 2018-01-14: 4 mg via INTRAVENOUS

## 2018-01-14 MED ORDER — FENTANYL CITRATE (PF) 100 MCG/2ML IJ SOLN
INTRAMUSCULAR | Status: DC | PRN
  Administered 2018-01-14: 100 ug via INTRAVENOUS
  Administered 2018-01-14: 50 ug via INTRAVENOUS

## 2018-01-14 MED ORDER — INDOCYANINE GREEN 25 MG IV SOLR
7.5000 mg | Freq: Once | INTRAVENOUS | Status: AC
  Administered 2018-01-14: 7.5 mg via INTRAVENOUS
  Filled 2018-01-14: qty 25

## 2018-01-14 MED ORDER — CHLORHEXIDINE GLUCONATE CLOTH 2 % EX PADS: 6.0000 | MEDICATED_PAD | Freq: Once | CUTANEOUS | Status: DC

## 2018-01-14 MED ORDER — LIDOCAINE HCL (CARDIAC) PF 100 MG/5ML IV SOSY
PREFILLED_SYRINGE | INTRAVENOUS | Status: DC | PRN
  Administered 2018-01-14: 60 mg via INTRAVENOUS

## 2018-01-14 MED ORDER — PROMETHAZINE HCL 25 MG/ML IJ SOLN: 6.2500 mg | INTRAMUSCULAR | Status: DC | PRN

## 2018-01-14 MED ORDER — FAMOTIDINE 20 MG PO TABS
ORAL_TABLET | ORAL | Status: AC
  Administered 2018-01-14: 20 mg via ORAL
  Filled 2018-01-14: qty 1

## 2018-01-14 MED ORDER — FENTANYL CITRATE (PF) 250 MCG/5ML IJ SOLN
INTRAMUSCULAR | Status: AC
  Filled 2018-01-14: qty 5

## 2018-01-14 MED ORDER — ROCURONIUM BROMIDE 100 MG/10ML IV SOLN
INTRAVENOUS | Status: DC | PRN
  Administered 2018-01-14: 50 mg via INTRAVENOUS

## 2018-01-14 MED ORDER — ONDANSETRON HCL 4 MG/2ML IJ SOLN
INTRAMUSCULAR | Status: AC
  Filled 2018-01-14: qty 2

## 2018-01-14 MED ORDER — HYDROCODONE-ACETAMINOPHEN 5-325 MG PO TABS
ORAL_TABLET | ORAL | Status: AC
  Filled 2018-01-14: qty 1

## 2018-01-14 MED ORDER — SUGAMMADEX SODIUM 200 MG/2ML IV SOLN
INTRAVENOUS | Status: AC
  Filled 2018-01-14: qty 2

## 2018-01-14 MED ORDER — LIDOCAINE HCL (PF) 2 % IJ SOLN
INTRAMUSCULAR | Status: AC
  Filled 2018-01-14: qty 10

## 2018-01-14 SURGICAL SUPPLY — 42 items
"PENCIL ELECTRO HAND CTR " (MISCELLANEOUS) ×1 IMPLANT
CANISTER SUCT 1200ML W/VALVE (MISCELLANEOUS) ×2 IMPLANT
CHLORAPREP W/TINT 26ML (MISCELLANEOUS) ×2 IMPLANT
CLIP VESOLOCK MED LG 6/CT (CLIP) ×4 IMPLANT
CORD BIP STRL DISP 12FT (MISCELLANEOUS) ×2 IMPLANT
COVER TIP SHEARS 8 DVNC (MISCELLANEOUS) IMPLANT
COVER TIP SHEARS 8MM DA VINCI (MISCELLANEOUS)
COVER WAND RF STERILE (DRAPES) ×2 IMPLANT
DECANTER SPIKE VIAL GLASS SM (MISCELLANEOUS) ×2 IMPLANT
DEFOGGER SCOPE WARMER CLEARIFY (MISCELLANEOUS) ×2 IMPLANT
DERMABOND ADVANCED (GAUZE/BANDAGES/DRESSINGS) ×1
DERMABOND ADVANCED .7 DNX12 (GAUZE/BANDAGES/DRESSINGS) ×1 IMPLANT
DRAPE 3 ARM ACCESS DA VINCI (DRAPES) ×1
DRAPE 3 ARM ACCESS DVNC (DRAPES) ×1 IMPLANT
DRAPE SHEET LG 3/4 BI-LAMINATE (DRAPES) ×2 IMPLANT
ELECT REM PT RETURN 9FT ADLT (ELECTROSURGICAL) ×2
ELECTRODE REM PT RTRN 9FT ADLT (ELECTROSURGICAL) ×1 IMPLANT
GLOVE BIO SURGEON STRL SZ7 (GLOVE) ×4 IMPLANT
GOWN STRL REUS W/ TWL LRG LVL3 (GOWN DISPOSABLE) ×4 IMPLANT
GOWN STRL REUS W/TWL LRG LVL3 (GOWN DISPOSABLE) ×4
IRRIGATION STRYKERFLOW (MISCELLANEOUS) IMPLANT
IRRIGATOR STRYKERFLOW (MISCELLANEOUS)
IV NS 1000ML (IV SOLUTION) ×1
IV NS 1000ML BAXH (IV SOLUTION) ×1 IMPLANT
KIT PINK PAD W/HEAD ARE REST (MISCELLANEOUS) ×2
KIT PINK PAD W/HEAD ARM REST (MISCELLANEOUS) ×1 IMPLANT
LABEL OR SOLS (LABEL) ×2 IMPLANT
NEEDLE HYPO 22GX1.5 SAFETY (NEEDLE) ×2 IMPLANT
NS IRRIG 500ML POUR BTL (IV SOLUTION) ×2 IMPLANT
PACK LAP CHOLECYSTECTOMY (MISCELLANEOUS) ×2 IMPLANT
PENCIL ELECTRO HAND CTR (MISCELLANEOUS) ×2 IMPLANT
POUCH SPECIMEN RETRIEVAL 10MM (ENDOMECHANICALS) ×2 IMPLANT
PROGRASP ENDOWRIST DA VINCI (INSTRUMENTS)
PROGRASP ENDOWRIST DVNC (INSTRUMENTS) IMPLANT
SOLUTION ELECTROLUBE (MISCELLANEOUS) ×2 IMPLANT
SPONGE LAP 18X18 RF (DISPOSABLE) ×2 IMPLANT
STRAP SAFETY 5IN WIDE (MISCELLANEOUS) ×2 IMPLANT
SUT MNCRL AB 4-0 PS2 18 (SUTURE) ×2 IMPLANT
SUT VICRYL 0 AB UR-6 (SUTURE) ×4 IMPLANT
TROCAR 130MM GELPORT  DAV (MISCELLANEOUS) ×2 IMPLANT
TROCAR XCEL NON-BLD 5MMX100MML (ENDOMECHANICALS) ×2 IMPLANT
TUBING INSUF HEATED (TUBING) ×2 IMPLANT

## 2018-01-14 NOTE — Discharge Instructions (Signed)

## 2018-01-14 NOTE — Anesthesia Postprocedure Evaluation (Signed)
Anesthesia Post Note  Patient: Marie [name]  Procedure(s) Performed: ROBOTIC ASSISTED LAPAROSCOPIC CHOLECYSTECTOMY-MULTI SITE (N/A Abdomen)  Patient location during evaluation: PACU Anesthesia Type: General Level of consciousness: awake and alert and oriented Pain management: pain level controlled Vital Signs Assessment: post-procedure vital signs reviewed and stable Respiratory status: spontaneous breathing, nonlabored ventilation and respiratory function stable Cardiovascular status: blood pressure returned to baseline and stable Postop Assessment: no signs of nausea or vomiting Anesthetic complications: no     Last Vitals:  Vitals:   01/14/18 0900 01/14/18 0945  BP: (!) 151/88 (!) 131/93  Pulse:    Resp:    Temp: (!) 36.3 C (!) 36.2 C  SpO2:      Last Pain:  Vitals:   01/14/18 0945  TempSrc:   PainSc: 4                  Gennie Eisinger

## 2018-01-14 NOTE — Transfer of Care (Signed)
Immediate Anesthesia Transfer of Care Note  Patient: Marie [name]  Procedure(s) Performed: ROBOTIC ASSISTED LAPAROSCOPIC CHOLECYSTECTOMY-MULTI SITE (N/A Abdomen)  Patient Location: PACU  Anesthesia Type:General  Level of Consciousness: awake, alert , oriented and patient cooperative  Airway & Oxygen Therapy: Patient Spontanous Breathing and Patient connected to face mask oxygen  Post-op Assessment: Report given to RN and Post -op Vital signs reviewed and stable  Post vital signs: Reviewed and stable  Last Vitals:  Vitals Value Taken Time  BP 151/88 01/14/2018  9:00 AM  Temp    Pulse 57 01/14/2018  9:01 AM  Resp 13 01/14/2018  9:01 AM  SpO2 100 % 01/14/2018  9:01 AM  Vitals shown include unvalidated device data.  Last Pain:  Vitals:   01/14/18 0606  TempSrc: Tympanic  PainSc: 7          Complications: No apparent anesthesia complications

## 2018-01-14 NOTE — Anesthesia Post-op Follow-up Note (Signed)
Anesthesia QCDR form completed.        

## 2018-01-14 NOTE — Anesthesia Preprocedure Evaluation (Signed)
Anesthesia Evaluation  Patient identified by MRN, date of birth, ID band Patient awake    Reviewed: Allergy & Precautions, NPO status , Patient's Chart, lab work & pertinent test results  History of Anesthesia Complications Negative for: history of anesthetic complications  Airway Mallampati: II  TM Distance: >3 FB Neck ROM: Full    Dental  (+) Edentulous Upper, Edentulous Lower   Pulmonary COPD, Current Smoker,    breath sounds clear to auscultation- rhonchi (-) wheezing      Cardiovascular hypertension, + CAD, + Cardiac Stents (2011), + CABG (2000) and +CHF   Rhythm:Regular Rate:Normal - Systolic murmurs and - Diastolic murmurs    Neuro/Psych  Headaches, negative psych ROS   GI/Hepatic Neg liver ROS, GERD  ,  Endo/Other  diabetes, Oral Hypoglycemic Agents  Renal/GU negative Renal ROS     Musculoskeletal negative musculoskeletal ROS (+)   Abdominal (+) - obese,   Peds  Hematology negative hematology ROS (+)   Anesthesia Other Findings Past Medical History: No date: CHF (congestive heart failure) ([facility]) No date: Chronic pancreatitis ([facility]) No date: Coronary atherosclerosis of unspecified type of vessel,  native or graft No date: Headache(784.0)     Comment:  "weekly" (07/29/2013) No date: Hypertension No date: Leiomyoma of uterus, unspecified No date: Loss of weight No date: Obesity, unspecified No date: OSA (obstructive sleep apnea)     Comment:  no CPAP No date: Pure hypercholesterolemia No date: Sarcoidosis No date: Shortness of breath     Comment:  none since stent 2 yrs ago No date: Type II diabetes mellitus ([place])   Reproductive/Obstetrics                             Anesthesia Physical Anesthesia Plan  ASA: III  Anesthesia Plan: General   Post-op Pain Management:    Induction: Intravenous  PONV Risk Score and Plan: 1 and Ondansetron and Midazolam  Airway Management  Planned: Oral ETT  Additional Equipment:   Intra-op Plan:   Post-operative Plan: Extubation in OR  Informed Consent: I have reviewed the patients History and Physical, chart, labs and discussed the procedure including the risks, benefits and alternatives for the proposed anesthesia with the patient or authorized representative who has indicated his/her understanding and acceptance.   Dental advisory given  Plan Discussed with: CRNA and Anesthesiologist  Anesthesia Plan Comments:         Anesthesia Quick Evaluation

## 2018-01-14 NOTE — Anesthesia Procedure Notes (Signed)
Procedure Name: Intubation Date/Time: 01/14/2018 7:39 AM Performed by: Eben Burow, CRNA Pre-anesthesia Checklist: Patient identified, Emergency Drugs available, Suction available, Patient being monitored and Timeout performed Patient Re-evaluated:Patient Re-evaluated prior to induction Oxygen Delivery Method: Circle system utilized Preoxygenation: Pre-oxygenation with 100% oxygen Induction Type: IV induction Ventilation: Mask ventilation without difficulty Laryngoscope Size: Mac and 4 Grade View: Grade I Tube type: Oral Tube size: 7.0 mm Number of attempts: 1 Airway Equipment and Method: Stylet and LTA kit utilized Placement Confirmation: ETT inserted through vocal cords under direct vision,  positive ETCO2 and breath sounds checked- equal and bilateral Secured at: 21 cm Tube secured with: Tape Dental Injury: Teeth and Oropharynx as per pre-operative assessment

## 2018-01-14 NOTE — Op Note (Signed)
Robotic assisted laparoscopic Cholecystectomy  Pre-operative Diagnosis: Biliary colic  Post-operative Diagnosis: Same   Surgeon: Caroleen Hamman, MD FACS  Anesthesia: Gen. with endotracheal tube  Findings: Chronic Cholecystitis  ICG cholangiogram show no evidence of bile leak or aberrant anatomy  Estimated Blood Loss: 5cc         Drains: none         Specimens: Gallbladder           Complications: none   Procedure Details  The patient was seen again in the Holding Room. The benefits, complications, treatment options, and expected outcomes were discussed with the patient. The risks of bleeding, infection, recurrence of symptoms, failure to resolve symptoms, bile duct damage, bile duct leak, retained common bile duct stone, bowel injury, any of which could require further surgery and/or ERCP, stent, or papillotomy were reviewed with the patient. The likelihood of improving the patient's symptoms with return to their baseline status is good.  The patient and/or family concurred with the proposed plan, giving informed consent.  The patient was taken to Operating Room, identified as Marie [name] and the procedure verified as Robotic Laparoscopic Cholecystectomy.  A Time Out was held and the above information confirmed.  Prior to the induction of general anesthesia, antibiotic prophylaxis was administered. VTE prophylaxis was in place. General endotracheal anesthesia was then administered and tolerated well. After the induction, the abdomen was prepped with Chloraprep and draped in the sterile fashion. The patient was positioned in the supine position.  Cut down technique was used to enter the abdominal cavity and a Hasson trochar was placed after two vicryl stitches were anchored to the fascia. Pneumoperitoneum was then created with CO2 and tolerated well without any adverse changes in the patient's vital signs.  Three 34mm ports were placed  under direct vision. All skin incisions  were  infiltrated with a local anesthetic agent before making the incision and placing the trocars.   The patient was positioned  in reverse Trendelenburg.  The robot was brought to the surgical field and docked in the standard fashion.  We made sure there were no collisions within the arms and the instruments were under direct visualization at all times.  I scrubbed out and went to the console.     The gallbladder was identified, the fundus grasped and retracted cephalad. Adhesions were lysed bluntly. The infundibulum was grasped and retracted laterally, exposing the peritoneum overlying the triangle of Calot. This was then divided and exposed in a blunt fashion. An extended critical view of the cystic duct and cystic artery was obtained.  The cystic duct was clearly identified and bluntly dissected.   Artery and duct were double clipped and divided.  The gallbladder was taken from the gallbladder fossa in a retrograde fashion with the electrocautery.  Inspection of the right upper quadrant was performed. No bleeding, bile duct injury or leak, or bowel injury was noted. The instruments were removed and the robot was undocked; The gallbladder was removed and placed in an Endocatch bag.   Second Look laparoscopy revealed no evidence of any bowel injury or vascular injury   Pneumoperitoneum was released and ports were removed The periumbilical port site was closed with interrumpted 0 Vicryl sutures. 4-0 subcuticular Monocryl was used to close the skin. Dermabond was  applied.  The patient was then extubated and brought to the recovery room in stable condition. Sponge, lap, and needle counts were correct at closure and at the conclusion of the case.  Caroleen Hamman, MD, FACS

## 2018-01-14 NOTE — Interval H&P Note (Signed)
History and Physical Interval Note:  01/14/2018 7:18 AM  Sunday Spillers Marie [name]  has presented today for surgery, with the diagnosis of cholelithiasis  The various methods of treatment have been discussed with the patient and family. After consideration of risks, benefits and other options for treatment, the patient has consented to  Procedure(s): ROBOTIC [place] (N/A) as a surgical intervention .  The patient's history has been reviewed, patient examined, no change in status, stable for surgery.  I have reviewed the patient's chart and labs.  Questions were answered to the patient's satisfaction.     [place]

## 2018-01-15 LAB — SURGICAL PATHOLOGY

## 2018-01-19 ENCOUNTER — Other Ambulatory Visit: Payer: Self-pay

## 2018-01-19 DIAGNOSIS — E7800 Pure hypercholesterolemia, unspecified: Secondary | ICD-10-CM | POA: Insufficient documentation

## 2018-01-19 DIAGNOSIS — R1011 Right upper quadrant pain: Secondary | ICD-10-CM | POA: Diagnosis not present

## 2018-01-19 DIAGNOSIS — Z955 Presence of coronary angioplasty implant and graft: Secondary | ICD-10-CM | POA: Insufficient documentation

## 2018-01-19 DIAGNOSIS — Z9049 Acquired absence of other specified parts of digestive tract: Secondary | ICD-10-CM | POA: Insufficient documentation

## 2018-01-19 DIAGNOSIS — G4733 Obstructive sleep apnea (adult) (pediatric): Secondary | ICD-10-CM | POA: Diagnosis not present

## 2018-01-19 DIAGNOSIS — Z951 Presence of aortocoronary bypass graft: Secondary | ICD-10-CM | POA: Diagnosis not present

## 2018-01-19 DIAGNOSIS — R1013 Epigastric pain: Secondary | ICD-10-CM | POA: Diagnosis not present

## 2018-01-19 DIAGNOSIS — F17210 Nicotine dependence, cigarettes, uncomplicated: Secondary | ICD-10-CM | POA: Insufficient documentation

## 2018-01-19 DIAGNOSIS — Z79899 Other long term (current) drug therapy: Secondary | ICD-10-CM | POA: Diagnosis not present

## 2018-01-19 DIAGNOSIS — R7989 Other specified abnormal findings of blood chemistry: Secondary | ICD-10-CM | POA: Diagnosis not present

## 2018-01-19 DIAGNOSIS — K859 Acute pancreatitis without necrosis or infection, unspecified: Secondary | ICD-10-CM | POA: Diagnosis not present

## 2018-01-19 DIAGNOSIS — K861 Other chronic pancreatitis: Secondary | ICD-10-CM | POA: Insufficient documentation

## 2018-01-19 DIAGNOSIS — R109 Unspecified abdominal pain: Secondary | ICD-10-CM | POA: Diagnosis present

## 2018-01-19 DIAGNOSIS — I10 Essential (primary) hypertension: Secondary | ICD-10-CM | POA: Insufficient documentation

## 2018-01-19 DIAGNOSIS — E119 Type 2 diabetes mellitus without complications: Secondary | ICD-10-CM | POA: Insufficient documentation

## 2018-01-19 DIAGNOSIS — I2510 Atherosclerotic heart disease of native coronary artery without angina pectoris: Secondary | ICD-10-CM | POA: Diagnosis not present

## 2018-01-19 DIAGNOSIS — Z7902 Long term (current) use of antithrombotics/antiplatelets: Secondary | ICD-10-CM | POA: Diagnosis not present

## 2018-01-19 DIAGNOSIS — Z7984 Long term (current) use of oral hypoglycemic drugs: Secondary | ICD-10-CM | POA: Insufficient documentation

## 2018-01-19 DIAGNOSIS — J449 Chronic obstructive pulmonary disease, unspecified: Secondary | ICD-10-CM | POA: Insufficient documentation

## 2018-01-19 DIAGNOSIS — R1084 Generalized abdominal pain: Secondary | ICD-10-CM | POA: Diagnosis not present

## 2018-01-19 NOTE — ED Triage Notes (Signed)
First nurse: patient brought in by ems from home. Patient is 5 days post op gallbladder removal. Patient with complaint of abdominal pain after eating tonight. Patient took half a pain pill with no improvement. Per ems vital signs stable.

## 2018-01-19 NOTE — ED Triage Notes (Signed)
Pt states that she had her gallbladder removed this past week and tonight was the first night for her to eat a full meal, pt states that she has been having abd pain since, pt states that she took half a pain pill without relief. Pt points to the top of her abd when showing location of pain

## 2018-01-20 ENCOUNTER — Observation Stay
Admission: EM | Admit: 2018-01-20 | Discharge: 2018-01-21 | Disposition: A | Discharge status: Home / Self Care | Payer: 59 | Attending: Internal Medicine | Admitting: Internal Medicine

## 2018-01-20 ENCOUNTER — Other Ambulatory Visit: Payer: Self-pay

## 2018-01-20 ENCOUNTER — Emergency Department: Payer: 59

## 2018-01-20 ENCOUNTER — Encounter: Payer: Self-pay | Admitting: Radiology

## 2018-01-20 DIAGNOSIS — R1010 Upper abdominal pain, unspecified: Secondary | ICD-10-CM

## 2018-01-20 DIAGNOSIS — I2510 Atherosclerotic heart disease of native coronary artery without angina pectoris: Secondary | ICD-10-CM | POA: Diagnosis not present

## 2018-01-20 DIAGNOSIS — R109 Unspecified abdominal pain: Secondary | ICD-10-CM | POA: Diagnosis not present

## 2018-01-20 DIAGNOSIS — E119 Type 2 diabetes mellitus without complications: Secondary | ICD-10-CM | POA: Diagnosis not present

## 2018-01-20 DIAGNOSIS — Z9049 Acquired absence of other specified parts of digestive tract: Secondary | ICD-10-CM | POA: Diagnosis not present

## 2018-01-20 DIAGNOSIS — K859 Acute pancreatitis without necrosis or infection, unspecified: Secondary | ICD-10-CM | POA: Diagnosis not present

## 2018-01-20 DIAGNOSIS — G4733 Obstructive sleep apnea (adult) (pediatric): Secondary | ICD-10-CM | POA: Diagnosis not present

## 2018-01-20 DIAGNOSIS — Z951 Presence of aortocoronary bypass graft: Secondary | ICD-10-CM | POA: Diagnosis not present

## 2018-01-20 DIAGNOSIS — R7989 Other specified abnormal findings of blood chemistry: Secondary | ICD-10-CM | POA: Diagnosis not present

## 2018-01-20 DIAGNOSIS — K861 Other chronic pancreatitis: Secondary | ICD-10-CM | POA: Diagnosis not present

## 2018-01-20 DIAGNOSIS — R945 Abnormal results of liver function studies: Secondary | ICD-10-CM | POA: Diagnosis not present

## 2018-01-20 DIAGNOSIS — I10 Essential (primary) hypertension: Secondary | ICD-10-CM | POA: Diagnosis not present

## 2018-01-20 LAB — URINALYSIS, COMPLETE (UACMP) WITH MICROSCOPIC
BILIRUBIN URINE: NEGATIVE
Bacteria, UA: NONE SEEN
Glucose, UA: NEGATIVE mg/dL
Hgb urine dipstick: NEGATIVE
KETONES UR: NEGATIVE mg/dL
LEUKOCYTES UA: NEGATIVE
Nitrite: NEGATIVE
PROTEIN: NEGATIVE mg/dL
Specific Gravity, Urine: 1.005 (ref 1.005–1.030)
pH: 5 (ref 5.0–8.0)

## 2018-01-20 LAB — CBC
HCT: 41.8 % (ref 36.0–46.0)
HEMATOCRIT: 38.5 % (ref 36.0–46.0)
Hemoglobin: 12.3 g/dL (ref 12.0–15.0)
Hemoglobin: 13.5 g/dL (ref 12.0–15.0)
MCH: 31.4 pg (ref 26.0–34.0)
MCH: 31.5 pg (ref 26.0–34.0)
MCHC: 31.9 g/dL (ref 30.0–36.0)
MCHC: 32.3 g/dL (ref 30.0–36.0)
MCV: 97.2 fL (ref 80.0–100.0)
MCV: 98.7 fL (ref 80.0–100.0)
PLATELETS: 191 10*3/uL (ref 150–400)
PLATELETS: 234 10*3/uL (ref 150–400)
RBC: 3.9 MIL/uL (ref 3.87–5.11)
RBC: 4.3 MIL/uL (ref 3.87–5.11)
RDW: 12.4 % (ref 11.5–15.5)
RDW: 12.7 % (ref 11.5–15.5)
WBC: 4.4 10*3/uL (ref 4.0–10.5)
WBC: 5.7 10*3/uL (ref 4.0–10.5)
nRBC: 0 % (ref 0.0–0.2)
nRBC: 0 % (ref 0.0–0.2)

## 2018-01-20 LAB — COMPREHENSIVE METABOLIC PANEL
ALBUMIN: 3.6 g/dL (ref 3.5–5.0)
ALK PHOS: 119 U/L (ref 38–126)
ALT: 92 U/L — ABNORMAL HIGH (ref 0–44)
ANION GAP: 10 (ref 5–15)
AST: 204 U/L — AB (ref 15–41)
BUN: 8 mg/dL (ref 6–20)
CO2: 21 mmol/L — ABNORMAL LOW (ref 22–32)
Calcium: 9.2 mg/dL (ref 8.9–10.3)
Chloride: 108 mmol/L (ref 98–111)
Creatinine, Ser: 0.56 mg/dL (ref 0.44–1.00)
GFR calc Af Amer: >60 mL/min (ref >60)
GFR calc non Af Amer: >60 mL/min (ref >60)
GLUCOSE: 119 mg/dL — AB (ref 70–99)
Potassium: 4 mmol/L (ref 3.5–5.1)
Sodium: 139 mmol/L (ref 135–145)
Total Bilirubin: 0.7 mg/dL (ref 0.3–1.2)
Total Protein: 7 g/dL (ref 6.5–8.1)

## 2018-01-20 LAB — CREATININE, SERUM
Creatinine, Ser: 0.59 mg/dL (ref 0.44–1.00)
GFR calc Af Amer: >60 mL/min (ref >60)
GFR calc non Af Amer: >60 mL/min (ref >60)

## 2018-01-20 LAB — LIPASE, BLOOD: Lipase: 21 U/L (ref 11–51)

## 2018-01-20 MED ORDER — PRAVASTATIN SODIUM 20 MG PO TABS
20.0000 mg | ORAL_TABLET | Freq: Every day | ORAL | Status: DC
  Administered 2018-01-20: 20 mg via ORAL
  Filled 2018-01-20 (×2): qty 1

## 2018-01-20 MED ORDER — HYDROCODONE-ACETAMINOPHEN 5-325 MG PO TABS: 1.0000 | ORAL_TABLET | ORAL | Status: DC | PRN

## 2018-01-20 MED ORDER — METFORMIN HCL 500 MG PO TABS
500.0000 mg | ORAL_TABLET | Freq: Every day | ORAL | Status: DC
  Administered 2018-01-20 – 2018-01-21 (×2): 500 mg via ORAL
  Filled 2018-01-20 (×2): qty 1

## 2018-01-20 MED ORDER — SODIUM CHLORIDE 0.9 % IV BOLUS
1000.0000 mL | Freq: Once | INTRAVENOUS | Status: AC
  Administered 2018-01-20: 1000 mL via INTRAVENOUS

## 2018-01-20 MED ORDER — OXYCODONE-ACETAMINOPHEN 5-325 MG PO TABS
1.0000 | ORAL_TABLET | ORAL | Status: DC | PRN
  Administered 2018-01-20 – 2018-01-21 (×6): 1 via ORAL
  Filled 2018-01-20 (×6): qty 1

## 2018-01-20 MED ORDER — PANCRELIPASE (LIP-PROT-AMYL) 12000-38000 UNITS PO CPEP
12000.0000 [IU] | ORAL_CAPSULE | Freq: Three times a day (TID) | ORAL | Status: DC
  Administered 2018-01-20 – 2018-01-21 (×4): 12000 [IU] via ORAL
  Filled 2018-01-20 (×6): qty 1

## 2018-01-20 MED ORDER — HEPARIN SODIUM (PORCINE) 5000 UNIT/ML IJ SOLN
5000.0000 [IU] | Freq: Three times a day (TID) | INTRAMUSCULAR | Status: DC
  Administered 2018-01-20: 5000 [IU] via SUBCUTANEOUS
  Filled 2018-01-20: qty 1

## 2018-01-20 MED ORDER — CLOPIDOGREL BISULFATE 75 MG PO TABS
75.0000 mg | ORAL_TABLET | Freq: Every day | ORAL | Status: DC
  Administered 2018-01-20 – 2018-01-21 (×2): 75 mg via ORAL
  Filled 2018-01-20 (×2): qty 1

## 2018-01-20 MED ORDER — IOHEXOL 300 MG/ML  SOLN
100.0000 mL | Freq: Once | INTRAMUSCULAR | Status: AC | PRN
  Administered 2018-01-20: 100 mL via INTRAVENOUS

## 2018-01-20 MED ORDER — SODIUM CHLORIDE 0.9 % IV SOLN
INTRAVENOUS | Status: DC
  Administered 2018-01-20 – 2018-01-21 (×3): via INTRAVENOUS

## 2018-01-20 MED ORDER — ONDANSETRON HCL 4 MG/2ML IJ SOLN
4.0000 mg | Freq: Four times a day (QID) | INTRAMUSCULAR | Status: DC | PRN
  Administered 2018-01-20 – 2018-01-21 (×4): 4 mg via INTRAVENOUS
  Filled 2018-01-20 (×4): qty 2

## 2018-01-20 MED ORDER — ACETAMINOPHEN 325 MG PO TABS: 650.0000 mg | ORAL_TABLET | Freq: Four times a day (QID) | ORAL | Status: DC | PRN

## 2018-01-20 MED ORDER — ONDANSETRON HCL 4 MG/2ML IJ SOLN
4.0000 mg | Freq: Once | INTRAMUSCULAR | Status: AC
  Administered 2018-01-20: 4 mg via INTRAVENOUS
  Filled 2018-01-20: qty 2

## 2018-01-20 MED ORDER — TRAZODONE HCL 50 MG PO TABS: 25.0000 mg | ORAL_TABLET | Freq: Every evening | ORAL | Status: DC | PRN

## 2018-01-20 MED ORDER — HEPARIN SODIUM (PORCINE) 5000 UNIT/ML IJ SOLN
5000.0000 [IU] | Freq: Three times a day (TID) | INTRAMUSCULAR | Status: DC
  Administered 2018-01-20 – 2018-01-21 (×2): 5000 [IU] via SUBCUTANEOUS
  Filled 2018-01-20 (×2): qty 1

## 2018-01-20 MED ORDER — HYDROCHLOROTHIAZIDE 25 MG PO TABS
25.0000 mg | ORAL_TABLET | Freq: Every day | ORAL | Status: DC
  Administered 2018-01-20 – 2018-01-21 (×2): 25 mg via ORAL
  Filled 2018-01-20 (×2): qty 1

## 2018-01-20 MED ORDER — MORPHINE SULFATE (PF) 4 MG/ML IV SOLN
4.0000 mg | Freq: Once | INTRAVENOUS | Status: AC
  Administered 2018-01-20: 4 mg via INTRAVENOUS
  Filled 2018-01-20: qty 1

## 2018-01-20 MED ORDER — DOCUSATE SODIUM 100 MG PO CAPS
100.0000 mg | ORAL_CAPSULE | Freq: Two times a day (BID) | ORAL | Status: DC
  Administered 2018-01-20 – 2018-01-21 (×3): 100 mg via ORAL
  Filled 2018-01-20 (×3): qty 1

## 2018-01-20 MED ORDER — BISACODYL 5 MG PO TBEC
5.0000 mg | DELAYED_RELEASE_TABLET | Freq: Every day | ORAL | Status: DC | PRN
  Filled 2018-01-20: qty 1

## 2018-01-20 MED ORDER — METOPROLOL TARTRATE 50 MG PO TABS
100.0000 mg | ORAL_TABLET | Freq: Every day | ORAL | Status: DC
  Administered 2018-01-20 – 2018-01-21 (×2): 100 mg via ORAL
  Filled 2018-01-20 (×2): qty 2

## 2018-01-20 MED ORDER — ONDANSETRON HCL 4 MG PO TABS
4.0000 mg | ORAL_TABLET | Freq: Four times a day (QID) | ORAL | Status: DC | PRN
  Administered 2018-01-21: 4 mg via ORAL
  Filled 2018-01-20: qty 1

## 2018-01-20 MED ORDER — KETOROLAC TROMETHAMINE 30 MG/ML IJ SOLN
30.0000 mg | Freq: Four times a day (QID) | INTRAMUSCULAR | Status: DC | PRN
  Administered 2018-01-20 (×2): 30 mg via INTRAVENOUS
  Filled 2018-01-20 (×2): qty 1

## 2018-01-20 MED ORDER — INFLUENZA VAC SPLIT QUAD 0.5 ML IM SUSY
0.5000 mL | PREFILLED_SYRINGE | INTRAMUSCULAR | Status: DC
  Filled 2018-01-20: qty 0.5

## 2018-01-20 MED ORDER — ACETAMINOPHEN 650 MG RE SUPP: 650.0000 mg | Freq: Four times a day (QID) | RECTAL | Status: DC | PRN

## 2018-01-20 MED ORDER — HYDROCODONE-ACETAMINOPHEN 5-325 MG PO TABS
1.0000 | ORAL_TABLET | Freq: Four times a day (QID) | ORAL | Status: DC | PRN
  Administered 2018-01-21 (×2): 2 via ORAL
  Filled 2018-01-20 (×2): qty 2

## 2018-01-20 NOTE — ED Provider Notes (Signed)
-----------------------------------------   7:50 AM on 01/20/2018 -----------------------------------------  Patient care assumed from Dr. Mable Paris.  I have discussed with the hospitalist, they believe this is more likely a surgical issue and pancreatitis related.  Discussed the patient with Dr. Hampton Abbot of general surgery who believes that this is likely chronic pain or chronic pancreatitis and less likely related to the 1 cm seroma seen on CT.  They will discuss this matter with each other to decide upon appropriate disposition for the patient.  Dr. Hampton Abbot has seen the patient, they do not believe this to be a surgical issue.  I discussed with Dr. Manuella Ghazi of the hospitalist service he will be admitting to his service.    Harvest Dark, MD 01/20/18 [phone]

## 2018-01-20 NOTE — ED Provider Notes (Signed)
[facility] Emergency Department Provider Note  ____________________________________________   First MD Initiated Contact with Patient 01/20/18 [phone]     (approximate)  I have reviewed the triage vital signs and the nursing notes.   HISTORY  Chief Complaint Abdominal Pain and Post-op Problem   HPI Marie [name] is a 52 y.o. female who comes to the emergency department via EMS with epigastric and right upper quadrant pain that began suddenly at 10 PM last night roughly an hour and a half prior to arrival.  5 days ago she had a laparoscopic cholecystectomy performed by Dr. Dahlia Byes.  She had done well postoperatively until last night.  Her pain was sudden onset severe, constant and associated with nausea.  Is worse with movement improved with rest.  No fevers or chills.  No chest pain or shortness of breath.  No diarrhea.  She also has a long history of chronic pancreatitis and this does feel similar.   Past Medical History:  Diagnosis Date  . CHF (congestive heart failure) ([place])   . Chronic pancreatitis ([place])   . Coronary atherosclerosis of unspecified type of vessel, native or graft   . DPOEUMPN(361.4)    "weekly" (07/29/2013)  . Hypertension   . Leiomyoma of uterus, unspecified   . Loss of weight   . Obesity, unspecified   . OSA (obstructive sleep apnea)    no CPAP  . Pure hypercholesterolemia   . Sarcoidosis   . Shortness of breath    none since stent 2 yrs ago  . Type II diabetes mellitus [facility])     Patient Active Problem List   Diagnosis Date Noted  . Calculus of gallbladder without cholecystitis without obstruction   . Malnutrition of moderate degree 08/30/2016  . SBO (small bowel obstruction) ([place]) 08/28/2016  . History of coronary artery bypass graft 08/28/2016  . COPD with chronic bronchitis ([place]) 08/28/2016  . Essential hypertension 08/28/2016  . Chronic pain 08/28/2016  . ETOH abuse 08/28/2016  . Abdominal pain   . COPD (chronic  obstructive pulmonary disease) ([place]) 08/13/2013  . Hypoxemia 08/13/2013  . Syncope 07/29/2013  . Dehydration 07/29/2013  . Alcohol abuse, continuous 08/11/2012  . Upper abdominal pain 08/11/2012  . Abnormal uterine bleeding 08/04/2012  . Fibroid uterus 08/04/2012  . Hypokalemia 03/24/2011  . Acute pancreatitis 03/22/2011  . BRONCHITIS, OBSTRUCTIVE CHRONIC 04/14/2010  . 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 WEIGHT LOSS 03/24/2010  . FIBROIDS, UTERUS 08/24/2009  . ACID REFLUX DISEASE 08/03/2009  . HYPERCHOLESTEROLEMIA 02/22/2009  . OBESITY 12/10/2008  . TOBACCO ABUSE 12/10/2008  . PANCREATITIS, CHRONIC 12/10/2008  . HEART DISEASE 12/02/2008  . OSA (obstructive sleep apnea) 12/02/2008  . Diabetes mellitus with complication ([place]) 43/15/4008  . CAD 09/25/1998    Past Surgical History:  Procedure Laterality Date  . CORONARY ANGIOPLASTY WITH STENT PLACEMENT  09/2009   "1"  . CORONARY ARTERY BYPASS GRAFT  ~ 2000   CABG X3  . HYSTEROSCOPY WITH NOVASURE N/A 09/04/2012   Procedure: HYSTEROSCOPY WITH NOVASURE;  Surgeon: Mora Bellman, MD;  Location: [facility];  Service: Gynecology;  Laterality: N/A;  with removal intrauterine device  . ROBOTIC ASSISTED LAPAROSCOPIC CHOLECYSTECTOMY-MULTI SITE N/A 01/14/2018   Procedure: ROBOTIC ASSISTED LAPAROSCOPIC CHOLECYSTECTOMY-MULTI SITE;  Surgeon: Jules Husbands, MD;  Location: [facility];  Service: General;  Laterality: N/A;  . TUBAL LIGATION  1997    Prior to Admission medications   Medication Sig Start Date End Date Taking? Authorizing Provider  clopidogrel (PLAVIX) 75 MG tablet  Take 75 mg by mouth daily.    [provider]  hydrochlorothiazide (HYDRODIURIL) 25 MG tablet Take 25 mg by mouth daily.  08/15/12   Oswald Hillock, MD  HYDROcodone-acetaminophen (NORCO/VICODIN) 5-325 MG tablet Take 1 tablet by mouth every 4 (four) hours as needed. Patient taking differently: Take 1 tablet by mouth every 6 (six) hours as needed for moderate pain.   03/24/17   Harvest Dark, MD  HYDROcodone-acetaminophen (NORCO/VICODIN) 5-325 MG tablet Take 1-2 tablets by mouth every 6 (six) hours as needed for moderate pain. 01/14/18   Pabon, Diego F, MD  lipase/protease/amylase (CREON) 12000 units CPEP capsule Take 1 capsule (12,000 Units total) by mouth 3 (three) times daily with meals. Patient not taking: Reported on 01/01/2018 09/01/16   Nita Sells, MD  losartan (COZAAR) 50 MG tablet Take 50 mg by mouth daily.    [provider]  metFORMIN (GLUCOPHAGE) 500 MG tablet Take 500 mg by mouth daily with breakfast.     [provider]  metoprolol (LOPRESSOR) 100 MG tablet Take 100 mg by mouth daily.  05/30/15   [provider]  oxyCODONE-acetaminophen (PERCOCET) 5-325 MG tablet Take 1 tablet by mouth every 4 (four) hours as needed for severe pain. Patient not taking: Reported on 01/01/2018 12/19/17 12/19/18  Gregor Hams, MD  pravastatin (PRAVACHOL) 20 MG tablet Take 20 mg by mouth daily.     [provider]    Allergies Lisinopril  Family History  Problem Relation Age of Onset  . Diabetes Mother   . Coronary artery disease Mother   . Heart disease Mother   . Hyperlipidemia Mother   . Breast cancer Neg Hx     Social History Social History   Tobacco Use  . Smoking status: Current Every Day Smoker    Packs/day: 1.00    Years: 32.00    Pack years: 32.00    Types: Cigarettes  . Smokeless tobacco: Never Used  Substance Use Topics  . Alcohol use: Yes    Alcohol/week: 24.0 standard drinks    Types: 24 Glasses of wine per week    Comment: 08/28/2016 "3-4 glasses of wine qd; last glass was last night",  . Drug use: No    Review of Systems Constitutional: No fever/chills Eyes: No visual changes. ENT: No sore throat. Cardiovascular: Denies chest pain. Respiratory: Denies shortness of breath. Gastrointestinal: Positive for abdominal pain.  Positive for nausea, no vomiting.  No diarrhea.  No  constipation. Genitourinary: Negative for dysuria. Musculoskeletal: Negative for back pain. Skin: Negative for rash. Neurological: Negative for headaches, focal weakness or numbness.   ____________________________________________   PHYSICAL EXAM:  VITAL SIGNS: ED Triage Vitals  Enc Vitals Group     BP 01/19/18 2333 95/66     Pulse Rate 01/19/18 2333 84     Resp 01/19/18 2333 18     Temp 01/19/18 2333 97.7 F (36.5 C)     Temp Source 01/19/18 2333 Oral     SpO2 01/19/18 2333 93 %     Weight 01/19/18 2334 121 lb 0.5 oz (54.9 kg)     Height 01/19/18 2334 5\' 2"  (1.575 m)     Head Circumference --      Peak Flow --      Pain Score 01/19/18 2334 10     Pain Loc --      Pain Edu? --      Excl. in [place]? --     Constitutional: Alert and oriented x4 appears quite  uncomfortable guarding and holding her upper abdomen tearful Eyes: PERRL EOMI. Head: Atraumatic. Nose: No congestion/rhinnorhea. Mouth/Throat: No trismus Neck: No stridor.   Cardiovascular: Normal rate, regular rhythm. Grossly normal heart sounds.  Good peripheral circulation. Respiratory: Normal respiratory effort.  No retractions. Lungs CTAB and moving good air Gastrointestinal: No frank peritonitis but exquisitely tender in the epigastric area in the right upper quadrant. Musculoskeletal: No lower extremity edema   Neurologic:  Normal speech and language. No gross focal neurologic deficits are appreciated. Skin:  Skin is warm, dry and intact. No rash noted. Psychiatric: Mood and affect are normal. Speech and behavior are normal.    ____________________________________________   DIFFERENTIAL includes but not limited to  Postoperative infection, seroma, biloma, retained sponge, postoperative pain, pancreatitis ____________________________________________   LABS (all labs ordered are listed, but only abnormal results are displayed)  Labs Reviewed  COMPREHENSIVE METABOLIC PANEL - Abnormal; Notable for the  following components:      Result Value   CO2 21 (*)    Glucose, Bld 119 (*)    AST 204 (*)    ALT 92 (*)    All other components within normal limits  URINALYSIS, COMPLETE (UACMP) WITH MICROSCOPIC - Abnormal; Notable for the following components:   Color, Urine YELLOW (*)    APPearance CLEAR (*)    All other components within normal limits  LIPASE, BLOOD  CBC    Lab work reviewed by me shows new transaminitis otherwise unremarkable __________________________________________  EKG   ____________________________________________  RADIOLOGY  CT abdomen pelvis reviewed by me shows new subhepatic fluid collection ____________________________________________   PROCEDURES  Procedure(s) performed: no  Procedures  Critical Care performed: no  ____________________________________________   INITIAL IMPRESSION / ASSESSMENT AND PLAN / ED COURSE  Pertinent labs & imaging results that were available during my care of the patient were reviewed by me and considered in my medical decision making (see chart for details).   As part of my medical decision making, I reviewed the following data within the [place] History obtained from family if available, nursing notes, old chart and ekg, as well as notes from prior ED visits.  The patient comes to the emergency department extremely uncomfortable appearing with moderate to severe epigastric and right upper quadrant pain along with tenderness in the immediate postoperative period.  I appreciate that she had lab work and an ultrasound previously however she requires a CT scan with IV contrast.  4 mg of IV morphine and 4 mg of IV Zofran now for pain and nausea control.    ----------------------------------------- 6:38 AM on 01/20/2018 -----------------------------------------  The patient CT scan is back showing a subhepatic fluid collection which could be consistent with either seroma, biloma, or early abscess.  She is  afebrile with a normal white count however has a transaminitis and has required multiple doses of IV morphine for pain control with quite a tender abdomen.  I have a call out to general surgery to discuss.  ____________________________________________ ----------------------------------------- 7:45 AM on 01/20/2018 -----------------------------------------  I discussed the CT scan with on-call general surgeon Dr. Hampton Abbot who agrees there is a small amount of fluid however feels this is normal postoperatively and should not be responsible for the patient's severe pain.  She does have a calcified pancreas and on further questioning the patient says this does feel similar to previous episodes of pancreatitis she has had.  At this point given her persistent IV opioid and IV antiemetic requirement she does require inpatient admission.  She understands she is to be n.p.o. with IV fluids and pain control.  I discussed with the hospitalist Dr. female who has graciously agreed to admit the patient to her service.  FINAL CLINICAL IMPRESSION(S) / ED DIAGNOSES  Final diagnoses:  Abdominal pain  Chronic pancreatitis, unspecified pancreatitis type ([place])      NEW MEDICATIONS STARTED DURING THIS VISIT:  New Prescriptions   No medications on file     Note:  This document was prepared using Dragon voice recognition software and may include unintentional dictation errors.     Darel Hong, MD 01/20/18 [phone]

## 2018-01-20 NOTE — ED Notes (Signed)
Patient reports cholecystomy Tuesday. Patient c/o upper increased abdominal pain beginning yesterday at approx 2200. Patient reports nausea, denies emesis. Patient reports normal BM yesterday. Patient tender to palpation of abdomen. Normal bowel sounds auscultated throughout abdomen.  Surgical incisions noted to abdomen. Surgical scar noted to medial chest).

## 2018-01-20 NOTE — ED Notes (Signed)
Assisted patient to toilet. AS

## 2018-01-20 NOTE — H&P (Signed)
[facility] [place] NAME: Marie [name]    MR#:  [id number]  DATE OF BIRTH:  1965/04/04  DATE OF ADMISSION:  01/20/2018  PRIMARY CARE PHYSICIAN: Gaynelle Arabian, MD   REQUESTING/REFERRING PHYSICIAN: Harvest Dark, MD  CHIEF COMPLAINT:   Chief Complaint  Patient presents with  . Abdominal Pain  . Post-op Problem    HISTORY OF PRESENT ILLNESS:  Marie [name]  is a 52 y.o. female with a recent Lap chole performed by Dr. Dahlia Byes on 10/22 brought in via EMS with epigastric and right upper quadrant pain that began suddenly at 10 PM last night roughly an hour and a half prior to arrival.  She had done well postoperatively until last night.  Her pain was sudden onset severe, constant and associated with nausea.  Is worse with movement improved with rest. She also has a long history of chronic pancreatitis and this does feel similar. I discussed with surgeon on call to evaluate considering recent surgery and normal lipase as admission is requested for pancreatitis but surgeon Dr Hampton Abbot didn't find any acute surgical etio and requests medical admission. He mentioned her lipase had always been normal with acute pancreatitis. PAST MEDICAL HISTORY:   Past Medical History:  Diagnosis Date  . CHF (congestive heart failure) ([place])   . Chronic pancreatitis ([place])   . Coronary atherosclerosis of unspecified type of vessel, native or graft   . TIRWERXV(400.8)    "weekly" (07/29/2013)  . Hypertension   . Leiomyoma of uterus, unspecified   . Loss of weight   . Obesity, unspecified   . OSA (obstructive sleep apnea)    no CPAP  . Pure hypercholesterolemia   . Sarcoidosis   . Shortness of breath    none since stent 2 yrs ago  . Type II diabetes mellitus ([place])     PAST SURGICAL HISTORY:   Past Surgical History:  Procedure Laterality Date  . CORONARY ANGIOPLASTY WITH STENT PLACEMENT  09/2009   "1"  . CORONARY ARTERY BYPASS GRAFT  ~ 2000   CABG  X3  . HYSTEROSCOPY WITH NOVASURE N/A 09/04/2012   Procedure: HYSTEROSCOPY WITH NOVASURE;  Surgeon: Mora Bellman, MD;  Location: [facility];  Service: Gynecology;  Laterality: N/A;  with removal intrauterine device  . ROBOTIC ASSISTED LAPAROSCOPIC CHOLECYSTECTOMY-MULTI SITE N/A 01/14/2018   Procedure: ROBOTIC ASSISTED LAPAROSCOPIC CHOLECYSTECTOMY-MULTI SITE;  Surgeon: Jules Husbands, MD;  Location: [facility];  Service: General;  Laterality: N/A;  . TUBAL LIGATION  1997    SOCIAL HISTORY:   Social History   Tobacco Use  . Smoking status: Current Every Day Smoker    Packs/day: 1.00    Years: 32.00    Pack years: 32.00    Types: Cigarettes  . Smokeless tobacco: Never Used  Substance Use Topics  . Alcohol use: Yes    Alcohol/week: 24.0 standard drinks    Types: 24 Glasses of wine per week    Comment: 08/28/2016 "3-4 glasses of wine qd; last glass was last night",    FAMILY HISTORY:   Family History  Problem Relation Age of Onset  . Diabetes Mother   . Coronary artery disease Mother   . Heart disease Mother   . Hyperlipidemia Mother   . Breast cancer Neg Hx     DRUG ALLERGIES:   Allergies  Allergen Reactions  . Lisinopril Hives    REVIEW OF SYSTEMS:   Review of Systems  Constitutional: Negative for chills, fever and weight  loss.  HENT: Negative for nosebleeds and sore throat.   Eyes: Negative for blurred vision.  Respiratory: Negative for cough, shortness of breath and wheezing.   Cardiovascular: Negative for chest pain, orthopnea, leg swelling and PND.  Gastrointestinal: Positive for abdominal pain. Negative for constipation, diarrhea, heartburn, nausea and vomiting.  Genitourinary: Negative for dysuria and urgency.  Musculoskeletal: Negative for back pain.  Skin: Negative for rash.  Neurological: Negative for dizziness, speech change, focal weakness and headaches.  Endo/Heme/Allergies: Does not bruise/bleed easily.  Psychiatric/Behavioral: Negative for depression.     MEDICATIONS AT HOME:   Prior to Admission medications   Medication Sig Start Date End Date Taking? Authorizing Provider  clopidogrel (PLAVIX) 75 MG tablet Take 75 mg by mouth daily.   Yes [provider]  hydrochlorothiazide (HYDRODIURIL) 25 MG tablet Take 25 mg by mouth daily.  08/15/12  Yes Oswald Hillock, MD  HYDROcodone-acetaminophen (NORCO/VICODIN) 5-325 MG tablet Take 1-2 tablets by mouth every 6 (six) hours as needed for moderate pain. 01/14/18  Yes Pabon, Diego F, MD  losartan (COZAAR) 50 MG tablet Take 50 mg by mouth daily.   Yes [provider]  metFORMIN (GLUCOPHAGE) 500 MG tablet Take 500 mg by mouth daily with breakfast.    Yes [provider]  metoprolol (LOPRESSOR) 100 MG tablet Take 100 mg by mouth daily.  05/30/15  Yes [provider]  pravastatin (PRAVACHOL) 20 MG tablet Take 20 mg by mouth daily.    Yes [provider]  lipase/protease/amylase (CREON) 12000 units CPEP capsule Take 1 capsule (12,000 Units total) by mouth 3 (three) times daily with meals. Patient not taking: Reported on 01/01/2018 09/01/16   Nita Sells, MD  oxyCODONE-acetaminophen (PERCOCET) 5-325 MG tablet Take 1 tablet by mouth every 4 (four) hours as needed for severe pain. Patient not taking: Reported on 01/01/2018 12/19/17 12/19/18  Gregor Hams, MD      VITAL SIGNS:  Blood pressure 111/70, pulse 61, temperature 97.7 F (36.5 C), temperature source Oral, resp. rate 13, height 5\' 2"  (1.575 m), weight 54.9 kg, SpO2 98 %.  PHYSICAL EXAMINATION:  Physical Exam  Constitutional: She is oriented to person, place, and time.  HENT:  Head: Normocephalic and atraumatic.  Eyes: Pupils are equal, round, and reactive to light. Conjunctivae and EOM are normal.  Neck: Normal range of motion. Neck supple. No tracheal deviation present. No thyromegaly present.  Cardiovascular: Normal rate, regular rhythm and normal heart sounds.  Pulmonary/Chest: Effort normal  and breath sounds normal. No respiratory distress. She has no wheezes. She exhibits no tenderness.  Abdominal: Soft. Bowel sounds are normal. She exhibits no distension. There is generalized tenderness and tenderness in the epigastric area.  Musculoskeletal: Normal range of motion.  Neurological: She is alert and oriented to person, place, and time. No cranial nerve deficit.  Skin: Skin is warm and dry. No rash noted.   LABORATORY PANEL:   CBC Recent Labs  Lab 01/20/18 0846  WBC 4.4  HGB 12.3  HCT 38.5  PLT 191   ------------------------------------------------------------------------------------------------------------------  Chemistries  Recent Labs  Lab 01/19/18 2348 01/20/18 0846  NA 139  --   K 4.0  --   CL 108  --   CO2 21*  --   GLUCOSE 119*  --   BUN 8  --   CREATININE 0.56 0.59  CALCIUM 9.2  --   AST 204*  --   ALT 92*  --   ALKPHOS 119  --   BILITOT  0.7  --    ------------------------------------------------------------------------------------------------------------------  Cardiac Enzymes No results for input(s): TROPONINI in the last 168 hours. ------------------------------------------------------------------------------------------------------------------  RADIOLOGY:  Ct Abdomen Pelvis W Contrast  Result Date: 01/20/2018 CLINICAL DATA:  52 year old female with recent cholecystectomy presenting with abdominal pain. EXAM: CT ABDOMEN AND PELVIS WITH CONTRAST TECHNIQUE: Multidetector CT imaging of the abdomen and pelvis was performed using the standard protocol following bolus administration of intravenous contrast. CONTRAST:  133mL OMNIPAQUE IOHEXOL 300 MG/ML  SOLN COMPARISON:  Abdominal ultrasound dated 01/20/2018 and CT dated 12/19/2017 FINDINGS: Lower chest: Bilateral interstitial coarsening and left lung base scarring. No intra-abdominal free air or free fluid. Hepatobiliary: Probable mild fatty liver. No intrahepatic biliary ductal dilatation.  Cholecystectomy. There is stranding of the subhepatic fat. There is a 15 x 25 mm fluid collection along the inferior surface of the liver which may represent postsurgical seroma. A phlegmon or developing abscess is not excluded. Clinical correlation and follow-up recommended. A biloma is less likely. There is slight irregularity of the inferior surface of the liver along this collection. A traumatic liver injury or laceration is less likely but not excluded. Pancreas: Extensive coarse calcification of the pancreas sequela of chronic and recurrent pancreatitis. No evidence of active inflammation. Spleen: Normal in size without focal abnormality. Adrenals/Urinary Tract: The adrenal glands are unremarkable. There is no hydronephrosis or nephrolithiasis on either side. Subcentimeter left renal inferior pole hypodensities too small to characterize. There is symmetric enhancement and excretion of contrast by both kidneys. The visualized ureters appear unremarkable. The urinary bladder is distended. Stomach/Bowel: There is no bowel obstruction or active inflammation. The appendix is normal. Vascular/Lymphatic: Moderate aortoiliac atherosclerotic disease. No portal venous gas. There is no adenopathy. Reproductive: Enlarged myomatous uterus. Other: Induration of the anterior abdominal wall adjacent to the umbilicus likely secondary to laparoscopic port placement. No fluid collection. Musculoskeletal: No acute or significant osseous findings. IMPRESSION: 1. Cholecystectomy with a small subhepatic fluid collection, likely a postsurgical seroma. A developing abscess or biloma are less likely but not excluded. Clinical correlation recommended. No drainable fluid collection at this time. 2. Sequela of chronic and recurrent pancreatitis. 3. No bowel obstruction or active inflammation. Normal appendix. Electronically Signed   By: Anner Crete M.D.   On: 01/20/2018 06:34   US Abdomen Limited Ruq  Result Date:  01/20/2018 CLINICAL DATA:  52 year old female with abdominal pain. Status post recent cholecystectomy. EXAM: ULTRASOUND ABDOMEN LIMITED RIGHT UPPER QUADRANT COMPARISON:  CT of the abdomen pelvis dated 12/19/2017 FINDINGS: Gallbladder: Cholecystectomy. Common bile duct: Diameter: 4 mm Liver: The liver is unremarkable. Portal vein is patent on color Doppler imaging with normal direction of blood flow towards the liver. IMPRESSION: Cholecystectomy, otherwise unremarkable right upper quadrant ultrasound. Electronically Signed   By: Anner Crete M.D.   On: 01/20/2018 01:58   IMPRESSION AND PLAN:  24 y f with Acute on chronic pancreatitis  * Acute on chronic pancreatitis: CLD for now. - symptomatic mgmt  * elevated LFTs - likely due to recent lap chole and/pancreatitis  * htn - continue home meds and monitor  * dm - continue home meds and consider SSI if need    All the records are reviewed and case discussed with ED provider. Management plans discussed with the patient, Nursing and they are in agreement.  CODE STATUS: FULL CODE  TOTAL TIME TAKING CARE OF THIS PATIENT: 35 minutes.    Max Sane M.D on 01/20/2018 at 2:11 PM  Between 7am to 6pm - Pager - [phone]  After 6pm go to [email or web address] - Proofreader  [facility]  Office  [phone]  CC: Primary care physician; Gaynelle Arabian, MD   Note: This dictation was prepared with Dragon dictation along with smaller phrase technology. Any transcriptional errors that result from this process are unintentional.

## 2018-01-20 NOTE — ED Notes (Signed)
Patient given a warm blanket and updated on wait time.

## 2018-01-20 NOTE — ED Notes (Signed)
Pt given lunch tray.

## 2018-01-20 NOTE — ED Notes (Signed)
MD at bedside. 

## 2018-01-20 NOTE — ED Notes (Signed)
Pt ambulated to bedside commode. Peri care done independently.

## 2018-01-20 NOTE — ED Notes (Signed)
Pt c/o 9/10 pain. Requesting pain medication at this time.

## 2018-01-20 NOTE — ED Notes (Signed)
Pt oxygen at 89% on room air. Pt requested to this RN that she be placed on oxygen because pt felt as though she was becoming short of breath. Pt reported to this RN that she wears 2L oxygen at home intermittently, as needed, when she feels short of breath. Pt placed on 2L nasal cannula at this time. Will continue to monitor. MD notified.

## 2018-01-21 DIAGNOSIS — I2510 Atherosclerotic heart disease of native coronary artery without angina pectoris: Secondary | ICD-10-CM | POA: Diagnosis not present

## 2018-01-21 DIAGNOSIS — E119 Type 2 diabetes mellitus without complications: Secondary | ICD-10-CM | POA: Diagnosis not present

## 2018-01-21 DIAGNOSIS — R7989 Other specified abnormal findings of blood chemistry: Secondary | ICD-10-CM | POA: Diagnosis not present

## 2018-01-21 DIAGNOSIS — K859 Acute pancreatitis without necrosis or infection, unspecified: Secondary | ICD-10-CM | POA: Diagnosis not present

## 2018-01-21 DIAGNOSIS — G4733 Obstructive sleep apnea (adult) (pediatric): Secondary | ICD-10-CM | POA: Diagnosis not present

## 2018-01-21 DIAGNOSIS — Z9049 Acquired absence of other specified parts of digestive tract: Secondary | ICD-10-CM | POA: Diagnosis not present

## 2018-01-21 DIAGNOSIS — I10 Essential (primary) hypertension: Secondary | ICD-10-CM | POA: Diagnosis not present

## 2018-01-21 DIAGNOSIS — K861 Other chronic pancreatitis: Secondary | ICD-10-CM | POA: Diagnosis not present

## 2018-01-21 DIAGNOSIS — R945 Abnormal results of liver function studies: Secondary | ICD-10-CM | POA: Diagnosis not present

## 2018-01-21 DIAGNOSIS — Z951 Presence of aortocoronary bypass graft: Secondary | ICD-10-CM | POA: Diagnosis not present

## 2018-01-21 LAB — BASIC METABOLIC PANEL
ANION GAP: 7 (ref 5–15)
BUN: 8 mg/dL (ref 6–20)
CALCIUM: 8.2 mg/dL — AB (ref 8.9–10.3)
CO2: 26 mmol/L (ref 22–32)
Chloride: 107 mmol/L (ref 98–111)
Creatinine, Ser: 0.63 mg/dL (ref 0.44–1.00)
GFR calc non Af Amer: >60 mL/min (ref >60)
Glucose, Bld: 78 mg/dL (ref 70–99)
Potassium: 3.6 mmol/L (ref 3.5–5.1)
Sodium: 140 mmol/L (ref 135–145)

## 2018-01-21 LAB — CBC
HCT: 39 % (ref 36.0–46.0)
Hemoglobin: 12.2 g/dL (ref 12.0–15.0)
MCH: 31.4 pg (ref 26.0–34.0)
MCHC: 31.3 g/dL (ref 30.0–36.0)
MCV: 100.5 fL — ABNORMAL HIGH (ref 80.0–100.0)
NRBC: 0 % (ref 0.0–0.2)
PLATELETS: 203 10*3/uL (ref 150–400)
RBC: 3.88 MIL/uL (ref 3.87–5.11)
RDW: 12.3 % (ref 11.5–15.5)
WBC: 4.1 10*3/uL (ref 4.0–10.5)

## 2018-01-21 LAB — GLUCOSE, CAPILLARY: Glucose-Capillary: 96 mg/dL (ref 70–99)

## 2018-01-21 LAB — HIV ANTIBODY (ROUTINE TESTING W REFLEX): HIV SCREEN 4TH GENERATION: NONREACTIVE

## 2018-01-21 MED ORDER — ONDANSETRON HCL 4 MG PO TABS: 4.0000 mg | ORAL_TABLET | Freq: Four times a day (QID) | ORAL | 0 refills | Status: DC | PRN

## 2018-01-21 MED ORDER — PANCRELIPASE (LIP-PROT-AMYL) 12000-38000 UNITS PO CPEP: 12000.0000 [IU] | ORAL_CAPSULE | Freq: Three times a day (TID) | ORAL | 3 refills | Status: DC

## 2018-01-21 NOTE — Consult Note (Signed)
Date of Consultation:  01/21/2018  Requesting Physician:  Darel Hong, MD  Reason for Consultation:  Abdominal pain  History of Present Illness: Marie [name] is a 52 y.o. female who is recently s/p laparoscopic cholecystectomy with Dr. Dahlia Byes on 10/22 for chronic cholecystitis.  She presents to the ED with upper abdominal pain, across the epigastric area.  This is associated with nausea and an episode of emesis.  She has a long history of chronic pancreatitis as well and has been hospitalized before for this.  She reports that prior to her cholecystectomy, her biliary pain was in the RUQ, whereas her pain now is across her epigastrium, similar to her pancreatitis type of pain.  She reports her incisions are healing well but does have soreness over them, but no worsening pain at the incisions.  Denies any fevers or chills at home, chest pain, shortness of breath.  Did have an episode of diarrhea earlier.  In the ED, her workup included an ultrasound of RUQ which was unremarkable, and a CT scan which shows a very small 1.5 x 2.5 cm fluid collection in the gallbladder fossa.  Her total bilirubin was normal, but AST and ALT were mildly elevated to 204 and 92 respectively, but she has had elevated transaminases in the past.  Her lipase was normal at 21, but her lipase has been normal during her recent episodes of pancreatitis.  She has been on Creon in the past, but she's not currently taking any.  Past Medical History: Past Medical History:  Diagnosis Date  . CHF (congestive heart failure) ([place])   . Chronic pancreatitis ([place])   . Coronary atherosclerosis of unspecified type of vessel, native or graft   . OMBTDHRC(163.8)    "weekly" (07/29/2013)  . Hypertension   . Leiomyoma of uterus, unspecified   . Loss of weight   . Obesity, unspecified   . OSA (obstructive sleep apnea)    no CPAP  . Pure hypercholesterolemia   . Sarcoidosis   . Shortness of breath    none since stent 2 yrs ago  .  Type II diabetes mellitus ([place])      Past Surgical History: Past Surgical History:  Procedure Laterality Date  . CORONARY ANGIOPLASTY WITH STENT PLACEMENT  09/2009   "1"  . CORONARY ARTERY BYPASS GRAFT  ~ 2000   CABG X3  . HYSTEROSCOPY WITH NOVASURE N/A 09/04/2012   Procedure: HYSTEROSCOPY WITH NOVASURE;  Surgeon: Mora Bellman, MD;  Location: [facility];  Service: Gynecology;  Laterality: N/A;  with removal intrauterine device  . ROBOTIC ASSISTED LAPAROSCOPIC CHOLECYSTECTOMY-MULTI SITE N/A 01/14/2018   Procedure: ROBOTIC ASSISTED LAPAROSCOPIC CHOLECYSTECTOMY-MULTI SITE;  Surgeon: Jules Husbands, MD;  Location: [facility];  Service: General;  Laterality: N/A;  . TUBAL LIGATION  1997    Home Medications: Prior to Admission medications   Medication Sig Start Date End Date Taking? Authorizing Provider  clopidogrel (PLAVIX) 75 MG tablet Take 75 mg by mouth daily.   Yes [provider]  hydrochlorothiazide (HYDRODIURIL) 25 MG tablet Take 25 mg by mouth daily.  08/15/12  Yes Oswald Hillock, MD  HYDROcodone-acetaminophen (NORCO/VICODIN) 5-325 MG tablet Take 1-2 tablets by mouth every 6 (six) hours as needed for moderate pain. 01/14/18  Yes Pabon, Diego F, MD  losartan (COZAAR) 50 MG tablet Take 50 mg by mouth daily.   Yes [provider]  metFORMIN (GLUCOPHAGE) 500 MG tablet Take 500 mg by mouth daily with breakfast.    Yes [provider]  metoprolol (LOPRESSOR) 100 MG tablet Take 100 mg by mouth daily.  05/30/15  Yes [provider]  pravastatin (PRAVACHOL) 20 MG tablet Take 20 mg by mouth daily.    Yes [provider]  lipase/protease/amylase (CREON) 12000 units CPEP capsule Take 1 capsule (12,000 Units total) by mouth 3 (three) times daily with meals. 01/21/18   Max Sane, MD  ondansetron (ZOFRAN) 4 MG tablet Take 1 tablet (4 mg total) by mouth every 6 (six) hours as needed for nausea. 01/21/18   Max Sane, MD  oxyCODONE-acetaminophen (PERCOCET) 5-325  MG tablet Take 1 tablet by mouth every 4 (four) hours as needed for severe pain. Patient not taking: Reported on 01/01/2018 12/19/17 12/19/18  Gregor Hams, MD    Allergies: Allergies  Allergen Reactions  . Lisinopril Hives    Social History:  reports that she has been smoking cigarettes. She has a 32.00 pack-year smoking history. She has never used smokeless tobacco. She reports that she drinks about 24.0 standard drinks of alcohol per week. She reports that she does not use drugs.   Family History: Family History  Problem Relation Age of Onset  . Diabetes Mother   . Coronary artery disease Mother   . Heart disease Mother   . Hyperlipidemia Mother   . Breast cancer Neg Hx     Review of Systems: Review of Systems  Constitutional: Negative for chills and fever.  HENT: Negative for hearing loss.   Eyes: Negative for blurred vision.  Respiratory: Negative for shortness of breath.   Cardiovascular: Negative for chest pain.  Gastrointestinal: Positive for abdominal pain, diarrhea, nausea and vomiting.  Genitourinary: Negative for dysuria.  Musculoskeletal: Negative for myalgias.  Skin: Negative for rash.  Neurological: Negative for dizziness.  Psychiatric/Behavioral: Negative for depression.    Physical Exam BP 114/68 (BP Location: Left Arm)   Pulse 71   Temp (!) 97.5 F (36.4 C) (Oral)   Resp 16   Ht 5\' 2"  (1.575 m)   Wt 58.3 kg   SpO2 96%   BMI 23.52 kg/m  CONSTITUTIONAL: No acute distress HEENT:  Normocephalic, atraumatic, extraocular motion intact. NECK: Trachea is midline, and there is no jugular venous distension. RESPIRATORY:  Lungs are clear, and breath sounds are equal bilaterally. Normal respiratory effort without pathologic use of accessory muscles. CARDIOVASCULAR: Heart is regular without murmurs, gallops, or rubs. GI: The abdomen is soft, non-distended, with some mild tenderness to palpation over the epigastric area.  Non-peritoneal.  Her incisions are  clean, dry, and intact, without any induration or evidence of infection. There were no palpable masses.  MUSCULOSKELETAL:  Normal muscle strength and tone in all four extremities.  No peripheral edema or cyanosis. SKIN: Skin turgor is normal. There are no pathologic skin lesions.  NEUROLOGIC:  Motor and sensation is grossly normal.  Cranial nerves are grossly intact. PSYCH:  Alert and oriented to person, place and time. Affect is normal.  Laboratory Analysis: Results for orders placed or performed during the hospital encounter of 01/20/18 (from the past 24 hour(s))  Basic metabolic panel     Status: Abnormal   Collection Time: 01/21/18  3:25 AM  Result Value Ref Range   Sodium 140 135 - 145 mmol/L   Potassium 3.6 3.5 - 5.1 mmol/L   Chloride 107 98 - 111 mmol/L   CO2 26 22 - 32 mmol/L   Glucose, Bld 78 70 - 99 mg/dL   BUN 8 6 - 20 mg/dL   Creatinine, Ser 0.63 0.44 -  1.00 mg/dL   Calcium 8.2 (L) 8.9 - 10.3 mg/dL   GFR calc non Af Amer >60 >60 mL/min   GFR calc Af Amer >60 >60 mL/min   Anion gap 7 5 - 15  CBC     Status: Abnormal   Collection Time: 01/21/18  3:25 AM  Result Value Ref Range   WBC 4.1 4.0 - 10.5 K/uL   RBC 3.88 3.87 - 5.11 MIL/uL   Hemoglobin 12.2 12.0 - 15.0 g/dL   HCT 39.0 36.0 - 46.0 %   MCV 100.5 (H) 80.0 - 100.0 fL   MCH 31.4 26.0 - 34.0 pg   MCHC 31.3 30.0 - 36.0 g/dL   RDW 12.3 11.5 - 15.5 %   Platelets 203 150 - 400 K/uL   nRBC 0.0 0.0 - 0.2 %  Glucose, capillary     Status: None   Collection Time: 01/21/18  8:23 AM  Result Value Ref Range   Glucose-Capillary 96 70 - 99 mg/dL    Imaging: U/S 10/28: IMPRESSION: Cholecystectomy, otherwise unremarkable right upper quadrant ultrasound.  CT abd/pelvis 10/28: IMPRESSION: 1. Cholecystectomy with a small subhepatic fluid collection, likely a postsurgical seroma. A developing abscess or biloma are less likely but not excluded. Clinical correlation recommended. No drainable fluid collection at this time. 2.  Sequela of chronic and recurrent pancreatitis. 3. No bowel obstruction or active inflammation. Normal appendix.   Assessment and Plan: This is a 52 y.o. female with abdominal pain, s/p recent laparoscopic cholecystectomy.  I have independently viewed the patient's imaging studies and reviewed her laboratory studies.  Overall, her ultrasound did not show any abnormalities.  Her CT scan did show a very small fluid collection at the gallbladder fossa, but I believe it's more likely residual post-op fluid or seroma.  It is unlikely a biloma.  Her total bilirubin is normal, and though her AST/ALT are elevated, they have been in the past.  The patient describes her pain as more similar to her pancreatitis type of pain rather than her RUQ pain prior to her gallbladder surgery.  Also, the very small fluid collection is unlikely to be causing such significant pain either.  Most likely this pain is related to pancreatitis and her surgical status is more of an incidental circumstance.  Do not think she needs any surgical intervention or other testing at this point.  Would recommend pain control and po trial, and if unable to, admission to medicine for management of likely chronic pancreatitis pain.  She should keep her follow up appointment with Dr. Dahlia Byes on 01/29/18.  Face-to-face time spent with the patient and care providers was 55 minutes, with more than 50% of the time spent counseling, educating, and coordinating care of the patient.     Melvyn Neth, MD [facility] Pg:  [phone]

## 2018-01-21 NOTE — Discharge Instructions (Signed)
Acute Pancreatitis Acute pancreatitis is a condition in which the pancreas suddenly gets irritated and swollen (has inflammation). The pancreas is a large gland behind the stomach. It makes enzymes that help to digest food. The pancreas also makes hormones that help to control your blood sugar. Acute pancreatitis happens when the enzymes attack the pancreas and damage it. Most attacks last a couple of days and can cause serious problems. Follow these instructions at home: Eating and drinking  Follow instructions from your doctor about diet. You may need to: ? Avoid alcohol. ? Limit how much fat is in your diet.  Eat small meals often. Avoid eating big meals.  Drink enough fluid to keep your pee (urine) clear or pale yellow.  Do not drink alcohol if it caused your condition. General instructions  Take over-the-counter and prescription medicines only as told by your doctor.  Do not use any tobacco products. These include cigarettes, chewing tobacco, and e-cigarettes. If you need help quitting, ask your doctor.  Get plenty of rest.  If directed, check your blood sugar at home as told by your doctor.  Keep all follow-up visits as told by your doctor. This is important. Contact a doctor if:  You do not get better as quickly as expected.  You have new symptoms.  Your symptoms get worse.  You have lasting pain or weakness.  You continue to feel sick to your stomach (nauseous).  You get better and then you have another pain attack.  You have a fever. Get help right away if:  You cannot eat or keep fluids down.  Your pain becomes very bad.  Your skin or the white part of your eyes turns yellow (jaundice).  You throw up (vomit).  You feel dizzy or you pass out (faint).  Your blood sugar is high (over 300 mg/dL). This information is not intended to replace advice given to you by your health care provider. Make sure you discuss any questions you have with your health care  provider. Document Released: 08/29/2007 Document Revised: 08/18/2015 Document Reviewed: 12/14/2014 Elsevier Interactive Patient Education  2018 Elsevier Inc.  

## 2018-01-21 NOTE — Progress Notes (Signed)
Marie [name] to be D/C'd home per MD order.  Discussed prescriptions and follow up appointments with the patient. Prescriptions given to patient, medication list explained in detail. Pt verbalized understanding.  Allergies as of 01/21/2018      Reactions   Lisinopril Hives      Medication List    TAKE these medications   clopidogrel 75 MG tablet Commonly known as:  PLAVIX Take 75 mg by mouth daily.   hydrochlorothiazide 25 MG tablet Commonly known as:  HYDRODIURIL Take 25 mg by mouth daily.   HYDROcodone-acetaminophen 5-325 MG tablet Commonly known as:  NORCO/VICODIN Take 1-2 tablets by mouth every 6 (six) hours as needed for moderate pain.   lipase/protease/amylase 12000 units Cpep capsule Commonly known as:  CREON Take 1 capsule (12,000 Units total) by mouth 3 (three) times daily with meals.   losartan 50 MG tablet Commonly known as:  COZAAR Take 50 mg by mouth daily.   metFORMIN 500 MG tablet Commonly known as:  GLUCOPHAGE Take 500 mg by mouth daily with breakfast.   metoprolol tartrate 100 MG tablet Commonly known as:  LOPRESSOR Take 100 mg by mouth daily.   ondansetron 4 MG tablet Commonly known as:  ZOFRAN Take 1 tablet (4 mg total) by mouth every 6 (six) hours as needed for nausea.   oxyCODONE-acetaminophen 5-325 MG tablet Commonly known as:  PERCOCET/ROXICET Take 1 tablet by mouth every 4 (four) hours as needed for severe pain.   pravastatin 20 MG tablet Commonly known as:  PRAVACHOL Take 20 mg by mouth daily.       Vitals:   01/20/18 1929 01/21/18 0446  BP: 114/79 114/68  Pulse: 64 71  Resp: 20 16  Temp: (!) 97.5 F (36.4 C) (!) 97.5 F (36.4 C)  SpO2: 94% 96%    Skin clean, dry and intact without evidence of skin break down, no evidence of skin tears noted. IV catheter discontinued intact. Site without signs and symptoms of complications. Dressing and pressure applied. Pt denies pain at this time. No complaints noted.  An After Visit  Summary was printed and given to the patient. Patient escorted via [place], and D/C home via private auto.  Rik Wadel A Raul Winterhalter

## 2018-01-23 NOTE — Discharge Summary (Signed)
[place] [facility] [place] NAME: Marie [name]    MR#:  [id number]  DATE OF BIRTH:  [birth year]  DATE OF ADMISSION:  01/20/2018   ADMITTING PHYSICIAN: Sela Hua, MD  DATE OF DISCHARGE: 01/21/2018  5:12 PM  PRIMARY CARE PHYSICIAN: Gaynelle Arabian, MD   ADMISSION DIAGNOSIS:  Abdominal pain [R10.9] Chronic pancreatitis, unspecified pancreatitis type ([place]) [K86.1] DISCHARGE DIAGNOSIS:  Active Problems:   Acute on chronic pancreatitis ([place])  SECONDARY DIAGNOSIS:   Past Medical History:  Diagnosis Date  . CHF (congestive heart failure) ([place])   . Chronic pancreatitis ([place])   . Coronary atherosclerosis of unspecified type of vessel, native or graft   . JKKXFGHW(299.3)    "weekly" (07/29/2013)  . Hypertension   . Leiomyoma of uterus, unspecified   . Loss of weight   . Obesity, unspecified   . OSA (obstructive sleep apnea)    no CPAP  . Pure hypercholesterolemia   . Sarcoidosis   . Shortness of breath    none since stent 2 yrs ago  . Type II diabetes mellitus ([place])    HOSPITAL COURSE:   63 y f with Acute on chronic pancreatitis  * Acute on chronic pancreatitis: - tolerating diet and pain under control  * elevated LFTs: stable - likely due to recent lap chole and/pancreatitis  * htn - stable on home meds   * dm - continue home meds  DISCHARGE CONDITIONS:  stable CONSULTS OBTAINED:   DRUG ALLERGIES:   Allergies  Allergen Reactions  . Lisinopril Hives   DISCHARGE MEDICATIONS:   Allergies as of 01/21/2018      Reactions   Lisinopril Hives      Medication List    TAKE these medications   clopidogrel 75 MG tablet Commonly known as:  PLAVIX Take 75 mg by mouth daily.   hydrochlorothiazide 25 MG tablet Commonly known as:  HYDRODIURIL Take 25 mg by mouth daily.   HYDROcodone-acetaminophen 5-325 MG tablet Commonly known as:  NORCO/VICODIN Take 1-2 tablets by mouth every 6 (six) hours as needed for  moderate pain.   lipase/protease/amylase 12000 units Cpep capsule Commonly known as:  CREON Take 1 capsule (12,000 Units total) by mouth 3 (three) times daily with meals.   losartan 50 MG tablet Commonly known as:  COZAAR Take 50 mg by mouth daily.   metFORMIN 500 MG tablet Commonly known as:  GLUCOPHAGE Take 500 mg by mouth daily with breakfast.   metoprolol tartrate 100 MG tablet Commonly known as:  LOPRESSOR Take 100 mg by mouth daily.   ondansetron 4 MG tablet Commonly known as:  ZOFRAN Take 1 tablet (4 mg total) by mouth every 6 (six) hours as needed for nausea.   oxyCODONE-acetaminophen 5-325 MG tablet Commonly known as:  PERCOCET/ROXICET Take 1 tablet by mouth every 4 (four) hours as needed for severe pain.   pravastatin 20 MG tablet Commonly known as:  PRAVACHOL Take 20 mg by mouth daily.        DISCHARGE INSTRUCTIONS:   DIET:  Low fat, Low cholesterol diet DISCHARGE CONDITION:  Good ACTIVITY:  Activity as tolerated OXYGEN:  Home Oxygen: No.  Oxygen Delivery: room air DISCHARGE LOCATION:  home   If you experience worsening of your admission symptoms, develop shortness of breath, life threatening emergency, suicidal or homicidal thoughts you must seek medical attention immediately by calling 911 or calling your MD immediately  if symptoms less severe.  You Must read complete instructions/literature along  with all the possible adverse reactions/side effects for all the Medicines you take and that have been prescribed to you. Take any new Medicines after you have completely understood and accpet all the possible adverse reactions/side effects.   Please note  You were cared for by a hospitalist during your hospital stay. If you have any questions about your discharge medications or the care you received while you were in the hospital after you are discharged, you can call the unit and asked to speak with the hospitalist on call if the hospitalist that took  care of you is not available. Once you are discharged, your primary care physician will handle any further medical issues. Please note that NO REFILLS for any discharge medications will be authorized once you are discharged, as it is imperative that you return to your primary care physician (or establish a relationship with a primary care physician if you do not have one) for your aftercare needs so that they can reassess your need for medications and monitor your lab values.    On the day of Discharge:  VITAL SIGNS:  Blood pressure 114/68, pulse 71, temperature (!) 97.5 F (36.4 C), temperature source Oral, resp. rate 16, height 5\' 2"  (1.575 m), weight 58.3 kg, SpO2 96 %. PHYSICAL EXAMINATION:  GENERAL:  52 y.o.-year-old patient lying in the bed with no acute distress.  EYES: Pupils equal, round, reactive to light and accommodation. No scleral icterus. Extraocular muscles intact.  HEENT: Head atraumatic, normocephalic. Oropharynx and nasopharynx clear.  NECK:  Supple, no jugular venous distention. No thyroid enlargement, no tenderness.  LUNGS: Normal breath sounds bilaterally, no wheezing, rales,rhonchi or crepitation. No use of accessory muscles of respiration.  CARDIOVASCULAR: S1, S2 normal. No murmurs, rubs, or gallops.  ABDOMEN: Soft, non-tender, non-distended. Bowel sounds present. No organomegaly or mass.  EXTREMITIES: No pedal edema, cyanosis, or clubbing.  NEUROLOGIC: Cranial nerves II through XII are intact. Muscle strength 5/5 in all extremities. Sensation intact. Gait not checked.  PSYCHIATRIC: The patient is alert and oriented x 3.  SKIN: No obvious rash, lesion, or ulcer.  DATA REVIEW:   CBC Recent Labs  Lab 01/21/18 0325  WBC 4.1  HGB 12.2  HCT 39.0  PLT 203    Chemistries  Recent Labs  Lab 01/19/18 2348  01/21/18 0325  NA 139  --  140  K 4.0  --  3.6  CL 108  --  107  CO2 21*  --  26  GLUCOSE 119*  --  78  BUN 8  --  8  CREATININE 0.56   < > 0.63  CALCIUM  9.2  --  8.2*  AST 204*  --   --   ALT 92*  --   --   ALKPHOS 119  --   --   BILITOT 0.7  --   --    < > = values in this interval not displayed.     Follow-up Information    Gaynelle Arabian, MD. Schedule an appointment as soon as possible for a visit in 5 day(s).   Specialty:  Family Medicine Contact information: [address], [place] [address] [phone]            Management plans discussed with the patient, family and they are in agreement.  CODE STATUS: Prior   TOTAL TIME TAKING CARE OF THIS PATIENT: 45 minutes.    Max Sane M.D on 01/23/2018 at 8:14 PM  Between 7am to 6pm - Pager -  [phone]  After 6pm go to [email or web address] - Proofreader  [facility]  Office  [phone]  CC: Primary care physician; Gaynelle Arabian, MD   Note: This dictation was prepared with Dragon dictation along with smaller phrase technology. Any transcriptional errors that result from this process are unintentional.

## 2018-01-29 ENCOUNTER — Other Ambulatory Visit: Payer: Self-pay

## 2018-01-29 ENCOUNTER — Encounter: Payer: Self-pay | Admitting: Surgery

## 2018-01-29 ENCOUNTER — Ambulatory Visit (INDEPENDENT_AMBULATORY_CARE_PROVIDER_SITE_OTHER): Payer: 59 | Admitting: Surgery

## 2018-01-29 VITALS — BP 116/74 | HR 74 | Resp 12 | Ht 62.0 in | Wt 119.0 lb

## 2018-01-29 DIAGNOSIS — Z09 Encounter for follow-up examination after completed treatment for conditions other than malignant neoplasm: Secondary | ICD-10-CM

## 2018-01-29 NOTE — Patient Instructions (Signed)

## 2018-01-31 ENCOUNTER — Encounter: Payer: Self-pay | Admitting: Surgery

## 2018-01-31 NOTE — Progress Notes (Signed)
S/p rob lap chole Doing well No complaints Path d/w pt  PE NAD Abd: soft, nt, incisions c/d/i, no infection  A/ p Doing well No complications RTC prn

## 2018-02-03 DIAGNOSIS — K861 Other chronic pancreatitis: Secondary | ICD-10-CM | POA: Diagnosis not present

## 2018-02-13 DIAGNOSIS — J449 Chronic obstructive pulmonary disease, unspecified: Secondary | ICD-10-CM | POA: Diagnosis not present

## 2018-03-15 DIAGNOSIS — J449 Chronic obstructive pulmonary disease, unspecified: Secondary | ICD-10-CM | POA: Diagnosis not present

## 2018-04-15 DIAGNOSIS — J449 Chronic obstructive pulmonary disease, unspecified: Secondary | ICD-10-CM | POA: Diagnosis not present

## 2018-04-29 DIAGNOSIS — I2510 Atherosclerotic heart disease of native coronary artery without angina pectoris: Secondary | ICD-10-CM | POA: Diagnosis not present

## 2018-04-29 DIAGNOSIS — K861 Other chronic pancreatitis: Secondary | ICD-10-CM | POA: Diagnosis not present

## 2018-04-29 DIAGNOSIS — I119 Hypertensive heart disease without heart failure: Secondary | ICD-10-CM | POA: Diagnosis not present

## 2018-04-29 DIAGNOSIS — E1129 Type 2 diabetes mellitus with other diabetic kidney complication: Secondary | ICD-10-CM | POA: Diagnosis not present

## 2018-04-29 DIAGNOSIS — F17200 Nicotine dependence, unspecified, uncomplicated: Secondary | ICD-10-CM | POA: Diagnosis not present

## 2018-04-29 DIAGNOSIS — F1190 Opioid use, unspecified, uncomplicated: Secondary | ICD-10-CM | POA: Diagnosis not present

## 2018-04-29 DIAGNOSIS — F1020 Alcohol dependence, uncomplicated: Secondary | ICD-10-CM | POA: Diagnosis not present

## 2018-04-29 DIAGNOSIS — J209 Acute bronchitis, unspecified: Secondary | ICD-10-CM | POA: Diagnosis not present

## 2018-04-29 DIAGNOSIS — R809 Proteinuria, unspecified: Secondary | ICD-10-CM | POA: Diagnosis not present

## 2018-05-16 DIAGNOSIS — J449 Chronic obstructive pulmonary disease, unspecified: Secondary | ICD-10-CM | POA: Diagnosis not present

## 2018-06-14 DIAGNOSIS — J449 Chronic obstructive pulmonary disease, unspecified: Secondary | ICD-10-CM | POA: Diagnosis not present

## 2018-07-15 DIAGNOSIS — J449 Chronic obstructive pulmonary disease, unspecified: Secondary | ICD-10-CM | POA: Diagnosis not present

## 2018-07-25 ENCOUNTER — Telehealth: Payer: Self-pay | Admitting: *Deleted

## 2018-07-25 NOTE — Telephone Encounter (Signed)
-----   Message from Nelva Bush, MD sent at 07/25/2018  7:15 AM EDT ----- Regarding: RE: Referral notes in EPIC It looks like this referral is a preop for oral surgery and to establish routine cardiology follow-up (remote h/o CABG).  Please ask Ms. Rando when she is scheduled for her oral surgery.  If it is happing in the next month or the patient is having active cardiac symptoms, please set her up for an in-person visit as soon as possible.  If surgery is on hold due to COVID-19, she should have an in-person visit when COVID-19 precautions have eased.  Thanks.  Gerald Stabs ----- Message ----- From: Vanessa Ralphs, RN Sent: 07/24/2018   4:55 PM EDT To: Nelva Bush, MD Subject: Referral notes in EPIC                         Hi Dr End,  This is a new patient you were waiting to get referral notes to review before deciding on in office or virtual new patient visit.  They have been scanned in under the Media tab for you to review.  Thanks and see you tomorrow, Danise Mina

## 2018-07-25 NOTE — Telephone Encounter (Signed)
Called and spoke to patient. She states she is not currently scheduled for her oral surgery due to Covid and she also has to come up with an amount of money before they will do the surgery. Denies current chest pain or shortness of breath. She needs to see cardiologist for clearance. She elected to reschedule for June at this time and will contact us closer to time if she still has not gotten the surgery and money together.

## 2018-07-30 ENCOUNTER — Ambulatory Visit: Payer: 59 | Admitting: Internal Medicine

## 2018-08-14 DIAGNOSIS — J449 Chronic obstructive pulmonary disease, unspecified: Secondary | ICD-10-CM | POA: Diagnosis not present

## 2018-09-09 ENCOUNTER — Encounter: Payer: Self-pay | Admitting: Internal Medicine

## 2018-09-09 ENCOUNTER — Telehealth: Payer: Self-pay | Admitting: *Deleted

## 2018-09-09 NOTE — Telephone Encounter (Signed)

## 2018-09-09 NOTE — Progress Notes (Signed)
New Outpatient Visit Date: 09/10/2018  Referring Provider: Gaynelle Arabian, MD [address] [place],  [place] [address]  Chief Complaint: Chest pain and history of coronary artery disease  HPI:  Marie [name] is a 53 y.o. female who is being seen today for preoperative cardiovascular risk assessment in anticipation of dental implants and ongoing management of coronary artery disease by Dr. Marisue Humble. She has a history of coronary artery disease with three-vessel CABG in 09/1998 and subsequent PCI to the LCx in 07/2009, hypertension, type 2 diabetes mellitus, and chronic pancreatitis.  Marie [name] reports that her anginal equivalent leading up to her CABG in 2000 (performed at [facility]) and subsequent PCI in 2011 (performed at [facility]) was shortness of breath.  She also noted mild chest tightness.  Today, Marie [name] reports that she is doing fairly well.  She has experienced pain under the left breast for the last year when she bends forward.  The pain is sharp without associated symptoms.  The pain resolves as soon as she stands up again.  She has not had any exertional chest discomfort, though she notes mild dyspnea with exertion.  Her DOE is not nearly as pronounced as what she felt leading up to her CABG and PCI.  Marie [name] notes occasional palpitations during which it feels like her heart races.  This typically happens when she is in a stressful situation, on average happening about once a month.  Her chest sometimes feels a little tight when this happens.  Marie [name] denies orthopnea, PND, and lightheadedness.  She is in need of dental implants, though the procedure has not yet been scheduled on account of COVID-19.  Marie [name] last saw a cardiologist ~4 years ago; she believes this was through our practice, though I do not see any notes in Epic.  --------------------------------------------------------------------------------------------------  Cardiovascular History & Procedures:  Cardiovascular Problems:  Coronary artery disease  Risk Factors:  Known coronary artery disease, hypertension, and diabetes mellitus.  Cath/PCI:  PCI to LCx (07/2009)  CV Surgery:  Three-vessel CABG (09/1998)  EP Procedures and Devices:  None.  Non-Invasive Evaluation(s):  None available  Recent CV Pertinent Labs: Lab Results  Component Value Date   CHOL 210 (H) 03/22/2011   HDL 52 03/22/2011   LDLCALC 135 (H) 03/22/2011   TRIG 99 08/13/2012   CHOLHDL 4.0 03/22/2011   INR 1.07 03/22/2011   K 3.6 01/21/2018   MG 1.7 08/30/2016   BUN 8 01/21/2018   CREATININE 0.63 01/21/2018    --------------------------------------------------------------------------------------------------  Past Medical History:  Diagnosis Date  . CHF (congestive heart failure) ([place])   . Chronic pancreatitis ([place])   . Coronary atherosclerosis of unspecified type of vessel, native or graft   . JFHLKTGY(563.8)    "weekly" (07/29/2013)  . Hypertension   . Leiomyoma of uterus, unspecified   . Loss of weight   . Obesity, unspecified   . OSA (obstructive sleep apnea)    no CPAP  . Pure hypercholesterolemia   . Sarcoidosis   . Shortness of breath    none since stent 2 yrs ago  . Type II diabetes mellitus ([place])     Past Surgical History:  Procedure Laterality Date  . CORONARY ANGIOPLASTY WITH STENT PLACEMENT  09/2009   "1"  . CORONARY ARTERY BYPASS GRAFT  ~ 2000   CABG X3  . HYSTEROSCOPY WITH NOVASURE N/A 09/04/2012   Procedure: HYSTEROSCOPY WITH NOVASURE;  Surgeon: Mora Bellman, MD;  Location: [facility];  Service: Gynecology;  Laterality: N/A;  with removal intrauterine device  . ROBOTIC ASSISTED LAPAROSCOPIC CHOLECYSTECTOMY-MULTI SITE N/A 01/14/2018   Procedure: ROBOTIC ASSISTED LAPAROSCOPIC CHOLECYSTECTOMY-MULTI SITE;  Surgeon: Jules Husbands, MD;  Location: [facility];  Service: General;  Laterality: N/A;  . TUBAL LIGATION  1997    Current Meds  Medication Sig  . clopidogrel (PLAVIX) 75  MG tablet Take 75 mg by mouth daily.  . hydrochlorothiazide (HYDRODIURIL) 25 MG tablet Take 25 mg by mouth daily.   Marland Kitchen HYDROcodone-acetaminophen (NORCO/VICODIN) 5-325 MG tablet Take 1-2 tablets by mouth every 6 (six) hours as needed for moderate pain.  Marland Kitchen losartan (COZAAR) 50 MG tablet Take 50 mg by mouth daily.  . metFORMIN (GLUCOPHAGE) 500 MG tablet Take 500 mg by mouth daily with breakfast.   . metoprolol (LOPRESSOR) 100 MG tablet Take 100 mg by mouth daily.   . ondansetron (ZOFRAN) 4 MG tablet Take 1 tablet (4 mg total) by mouth every 6 (six) hours as needed for nausea.  Marland Kitchen oxyCODONE-acetaminophen (PERCOCET) 5-325 MG tablet Take 1 tablet by mouth every 4 (four) hours as needed for severe pain.  . pravastatin (PRAVACHOL) 20 MG tablet Take 20 mg by mouth daily.     Allergies: Lisinopril  Social History   Tobacco Use  . Smoking status: Current Every Day Smoker    Packs/day: 0.50    Years: 32.00    Pack years: 16.00    Types: Cigarettes  . Smokeless tobacco: Never Used  Substance Use Topics  . Alcohol use: Yes    Alcohol/week: 24.0 standard drinks    Types: 24 Glasses of wine per week    Comment: 08/28/2016 "3-4 glasses of wine qd; last glass was last night",  . Drug use: No    Family History  Problem Relation Age of Onset  . Diabetes Mother   . Coronary artery disease Mother   . Heart disease Mother   . Hyperlipidemia Mother   . Sudden death Mother   . Colon cancer Maternal Grandfather   . Liver disease Maternal Uncle   . Heart disease Maternal Uncle   . Heart disease Paternal Aunt   . Stroke Maternal Grandmother   . Breast cancer Neg Hx     Review of Systems: A 12-system review of systems was performed and was negative except as noted in the HPI.  --------------------------------------------------------------------------------------------------  Physical Exam: BP 136/82 (BP Location: Left Arm, Patient Position: Sitting, Cuff Size: Normal)   Pulse (!) 54   Ht 5\' 2"   (1.575 m)   Wt 116 lb 4 oz (52.7 kg)   BMI 21.26 kg/m   General:  NAD HEENT: No conjunctival pallor or scleral icterus. Moist mucous membranes. OP clear. Neck: Supple without lymphadenopathy, thyromegaly, JVD, or HJR. No carotid bruit. Lungs: Normal work of breathing. Clear to auscultation bilaterally without wheezes or crackles. Heart: Bradycardic but regular without murmurs, rubs, or gallops. Non-displaced PMI. Abd: Bowel sounds present. Soft, NT/ND without hepatosplenomegaly Ext: No lower extremity edema. Radial, PT, and DP pulses are 2+ bilaterally Skin: Warm and dry without rash. Neuro: CNIII-XII intact. Strength and fine-touch sensation intact in upper and lower extremities bilaterally. Psych: Normal mood and affect.  EKG:  Sinus bradycardia (HR 54 bpm).  Otherwise, no significant abnormality.  Lab Results  Component Value Date   WBC 4.1 01/21/2018   HGB 12.2 01/21/2018   HCT 39.0 01/21/2018   MCV 100.5 (H) 01/21/2018   PLT 203 01/21/2018    Lab Results  Component Value Date   NA  140 01/21/2018   K 3.6 01/21/2018   CL 107 01/21/2018   CO2 26 01/21/2018   BUN 8 01/21/2018   CREATININE 0.63 01/21/2018   GLUCOSE 78 01/21/2018   ALT 92 (H) 01/19/2018    Lab Results  Component Value Date   CHOL 210 (H) 03/22/2011   HDL 52 03/22/2011   LDLCALC 135 (H) 03/22/2011   TRIG 99 08/13/2012   CHOLHDL 4.0 03/22/2011   LDL (04/29/2018): 60  --------------------------------------------------------------------------------------------------  ASSESSMENT AND PLAN: Coronary artery disease with atypical chest pain and shortness of breath: Isolated left sided chest pain when bending forward is not typical for angina.  However, given history of very early CAD with CABG 20 years ago and PCI 9 years ago, as well as mild exertional dyspnea (which has been the patient's anginal equivalent), I have recommended that we obtain a pharmacologic myocardial perfusion stress test.  Physical exam  and EKG are normal today other than mild bradycardia.  We will continue her current medications, including clopidogrel and pravastatin.  I will also provide her with a prescription for sublingual nitroglycerin to be used as needed for chest pain.  Hyperlipidemia: Most recent labs from Dr. Lanelle Bal obtained in 04/2018 were reviewed through [place]; LDL was at goal (60).  We will continue pravastatin for now, though if there is evidence of progressive CAD, we may need to consider escalating to high-intensity statin therapy.  Hypertension: BP is borderline elevated today but typically lower at home.  No medication changes at this time.  Preop assessment: Oral surgery is typically a low-risk procedure from a cardiac standpoint.  However, in light of DOE and atypical chest pain, I recommend proceeding with pharmacologic myocardial perfusion stress test, as outlined above, before providing final clearance for any elective surgery.  Follow-up: Return to clinic in 3 months.  Nelva Bush, MD 09/11/2018 7:07 AM

## 2018-09-09 NOTE — Telephone Encounter (Signed)
Lmovm to complete covid 19 prescreening before visit.

## 2018-09-09 NOTE — Telephone Encounter (Signed)
Pt will return call to complete COVID 19 prescreening.

## 2018-09-10 ENCOUNTER — Ambulatory Visit (INDEPENDENT_AMBULATORY_CARE_PROVIDER_SITE_OTHER): Payer: 59 | Admitting: Internal Medicine

## 2018-09-10 ENCOUNTER — Encounter: Payer: Self-pay | Admitting: Internal Medicine

## 2018-09-10 ENCOUNTER — Other Ambulatory Visit: Payer: Self-pay

## 2018-09-10 VITALS — BP 136/82 | HR 54 | Ht 62.0 in | Wt 116.2 lb

## 2018-09-10 DIAGNOSIS — I2510 Atherosclerotic heart disease of native coronary artery without angina pectoris: Secondary | ICD-10-CM

## 2018-09-10 DIAGNOSIS — R0602 Shortness of breath: Secondary | ICD-10-CM | POA: Diagnosis not present

## 2018-09-10 DIAGNOSIS — Z01810 Encounter for preprocedural cardiovascular examination: Secondary | ICD-10-CM | POA: Diagnosis not present

## 2018-09-10 DIAGNOSIS — R0789 Other chest pain: Secondary | ICD-10-CM

## 2018-09-10 DIAGNOSIS — I10 Essential (primary) hypertension: Secondary | ICD-10-CM

## 2018-09-10 DIAGNOSIS — E785 Hyperlipidemia, unspecified: Secondary | ICD-10-CM | POA: Diagnosis not present

## 2018-09-10 MED ORDER — NITROGLYCERIN 0.4 MG SL SUBL: 0.4000 mg | SUBLINGUAL_TABLET | SUBLINGUAL | 2 refills | Status: DC | PRN

## 2018-09-10 NOTE — Patient Instructions (Addendum)
Medication Instructions:  Your physician has recommended you make the following change in your medication:  1- AS NEEDED Nitroglycerin 0.4 mg - Dissolve 1 tablet under your tongue every 5 minutes as needed for chest pain. Maximum of 3 doses. If chest pain does not subside, call 911 or go to the Emergency Room.   If you need a refill on your cardiac medications before your next appointment, please call your pharmacy.   Lab work: - None ordered.  If you have labs (blood work) drawn today and your tests are completely normal, you will receive your results only by: Marland Kitchen MyChart Message (if you have MyChart) OR . A paper copy in the mail If you have any lab test that is abnormal or we need to change your treatment, we will call you to review the results.  Testing/Procedures: Your physician has requested that you have a lexiscan myoview. For further information please visit [email or web address]. Please follow instruction sheet, as given.  [place]  Your caregiver has ordered a Stress Test with nuclear imaging. The purpose of this test is to evaluate the blood supply to your heart muscle. This procedure is referred to as a "Non-Invasive Stress Test." This is because other than having an IV started in your vein, nothing is inserted or "invades" your body. Cardiac stress tests are done to find areas of poor blood flow to the heart by determining the extent of coronary artery disease (CAD). Some patients exercise on a treadmill, which naturally increases the blood flow to your heart, while others who are  unable to walk on a treadmill due to physical limitations have a pharmacologic/chemical stress agent called Lexiscan . This medicine will mimic walking on a treadmill by temporarily increasing your coronary blood flow.   Please note: these test may take anywhere between 2-4 hours to complete  PLEASE REPORT TO [facility] AT THE FIRST DESK WILL DIRECT YOU WHERE  TO GO  Date of Procedure:_____________________________________  Arrival Time for Procedure:______________________________  Instructions regarding medication:   _xx_ : Hold diabetes medication morning of procedure  METFORMIN  _XX_:  Hold betablocker(s) night before procedure and morning of procedure  METOPROLOL  _XX_:  Hold other medications as follows:_______HYDROCHLOROTHIAZIDE____  PLEASE NOTIFY THE OFFICE AT LEAST 24 HOURS IN ADVANCE IF YOU ARE UNABLE TO KEEP YOUR APPOINTMENT.  [phone] AND  PLEASE NOTIFY NUCLEAR MEDICINE AT [facility] AT LEAST 24 HOURS IN ADVANCE IF YOU ARE UNABLE TO KEEP YOUR APPOINTMENT. [phone]  How to prepare for your Myoview test:  1. Do not eat or drink after midnight 2. No caffeine for 24 hours prior to test 3. No smoking 24 hours prior to test. 4. Your medication may be taken with water.  If your doctor stopped a medication because of this test, do not take that medication. 5. Ladies, please do not wear dresses.  Skirts or pants are appropriate. Please wear a short sleeve shirt. 6. No perfume, cologne or lotion. 7. Wear comfortable walking shoes. No heels!            Follow-Up: At [facility], you and your health needs are our priority.  As part of our continuing mission to provide you with exceptional heart care, we have created designated Provider Care Teams.  These Care Teams include your primary Cardiologist (physician) and Advanced Practice Providers (APPs -  Physician Assistants and Nurse Practitioners) who all work together to provide you with the care you need, when you need it.  You will need a follow up appointment in 3 months.  Please call our office 2 months in advance to schedule this appointment.  You may see DR Harrell Gave END or one of the following Advanced Practice Providers on your designated Care Team:   Murray Hodgkins, NP Christell Faith, PA-C . Marrianne Mood, PA-C      Cardiac Nuclear Scan A cardiac nuclear scan is  a test that measures blood flow to the heart when a person is resting and when he or she is exercising. The test looks for problems such as:  Not enough blood reaching a portion of the heart.  The heart muscle not working normally. You may need this test if:  You have heart disease.  You have had abnormal lab results.  You have had heart surgery or a balloon procedure to open up blocked arteries (angioplasty).  You have chest pain.  You have shortness of breath. In this test, a radioactive dye (tracer) is injected into your bloodstream. After the tracer has traveled to your heart, an imaging device is used to measure how much of the tracer is absorbed by or distributed to various areas of your heart. This procedure is usually done at a hospital and takes 2-4 hours. Tell a health care provider about:  Any allergies you have.  All medicines you are taking, including vitamins, herbs, eye drops, creams, and over-the-counter medicines.  Any problems you or family members have had with anesthetic medicines.  Any blood disorders you have.  Any surgeries you have had.  Any medical conditions you have.  Whether you are pregnant or may be pregnant. What are the risks? Generally, this is a safe procedure. However, problems may occur, including:  Serious chest pain and heart attack. This is only a risk if the stress portion of the test is done.  Rapid heartbeat.  Sensation of warmth in your chest. This usually passes quickly.  Allergic reaction to the tracer. What happens before the procedure?  Ask your health care provider about changing or stopping your regular medicines. This is especially important if you are taking diabetes medicines or blood thinners.  Follow instructions from your health care provider about eating or drinking restrictions.  Remove your jewelry on the day of the procedure. What happens during the procedure?  An IV will be inserted into one of your  veins.  Your health care provider will inject a small amount of radioactive tracer through the IV.  You will wait for 20-40 minutes while the tracer travels through your bloodstream.  Your heart activity will be monitored with an electrocardiogram (ECG).  You will lie down on an exam table.  Images of your heart will be taken for about 15-20 minutes.  You may also have a stress test. For this test, one of the following may be done: ? You will exercise on a treadmill or stationary bike. While you exercise, your heart's activity will be monitored with an ECG, and your blood pressure will be checked. ? You will be given medicines that will increase blood flow to parts of your heart. This is done if you are unable to exercise.  When blood flow to your heart has peaked, a tracer will again be injected through the IV.  After 20-40 minutes, you will get back on the exam table and have more images taken of your heart.  Depending on the type of tracer used, scans may need to be repeated 3-4 hours later.  Your IV line will  be removed when the procedure is over. The procedure may vary among health care providers and hospitals. What happens after the procedure?  Unless your health care provider tells you otherwise, you may return to your normal schedule, including diet, activities, and medicines.  Unless your health care provider tells you otherwise, you may increase your fluid intake. This will help to flush the contrast dye from your body. Drink enough fluid to keep your urine pale yellow.  Ask your health care provider, or the department that is doing the test: ? When will my results be ready? ? How will I get my results? Summary  A cardiac nuclear scan measures the blood flow to the heart when a person is resting and when he or she is exercising.  Tell your health care provider if you are pregnant.  Before the procedure, ask your health care provider about changing or stopping your  regular medicines. This is especially important if you are taking diabetes medicines or blood thinners.  After the procedure, unless your health care provider tells you otherwise, increase your fluid intake. This will help flush the contrast dye from your body.  After the procedure, unless your health care provider tells you otherwise, you may return to your normal schedule, including diet, activities, and medicines. This information is not intended to replace advice given to you by your health care provider. Make sure you discuss any questions you have with your health care provider. Document Released: 04/06/2004 Document Revised: 08/26/2017 Document Reviewed: 08/26/2017 Elsevier Interactive Patient Education  2019 [employer].

## 2018-09-11 ENCOUNTER — Encounter: Payer: Self-pay | Admitting: Internal Medicine

## 2018-09-11 DIAGNOSIS — R0602 Shortness of breath: Secondary | ICD-10-CM | POA: Insufficient documentation

## 2018-09-11 DIAGNOSIS — E785 Hyperlipidemia, unspecified: Secondary | ICD-10-CM | POA: Insufficient documentation

## 2018-09-11 DIAGNOSIS — R0789 Other chest pain: Secondary | ICD-10-CM | POA: Insufficient documentation

## 2018-09-11 DIAGNOSIS — I2510 Atherosclerotic heart disease of native coronary artery without angina pectoris: Secondary | ICD-10-CM | POA: Insufficient documentation

## 2018-09-12 ENCOUNTER — Telehealth: Payer: Self-pay | Admitting: Obstetrics & Gynecology

## 2018-09-12 NOTE — Telephone Encounter (Signed)
[place] referring for h/o abnormal pap smear. Called and left voicemail for patient to call back to be schedule

## 2018-09-14 DIAGNOSIS — J449 Chronic obstructive pulmonary disease, unspecified: Secondary | ICD-10-CM | POA: Diagnosis not present

## 2018-09-16 ENCOUNTER — Ambulatory Visit: Payer: 59 | Attending: Internal Medicine

## 2018-09-25 ENCOUNTER — Other Ambulatory Visit: Payer: Self-pay | Admitting: Family Medicine

## 2018-09-25 ENCOUNTER — Other Ambulatory Visit: Payer: Self-pay

## 2018-09-25 ENCOUNTER — Other Ambulatory Visit (HOSPITAL_COMMUNITY)
Admission: RE | Admit: 2018-09-25 | Discharge: 2018-09-25 | Disposition: A | Discharge status: Home / Self Care | Payer: 59 | Source: Ambulatory Visit | Attending: Obstetrics & Gynecology | Admitting: Obstetrics & Gynecology

## 2018-09-25 ENCOUNTER — Encounter: Payer: Self-pay | Admitting: Obstetrics & Gynecology

## 2018-09-25 ENCOUNTER — Ambulatory Visit (INDEPENDENT_AMBULATORY_CARE_PROVIDER_SITE_OTHER): Payer: 59 | Admitting: Obstetrics & Gynecology

## 2018-09-25 VITALS — BP 100/60 | Ht 62.0 in | Wt 114.0 lb

## 2018-09-25 DIAGNOSIS — R87612 Low grade squamous intraepithelial lesion on cytologic smear of cervix (LGSIL): Secondary | ICD-10-CM | POA: Diagnosis not present

## 2018-09-25 DIAGNOSIS — N871 Moderate cervical dysplasia: Secondary | ICD-10-CM | POA: Diagnosis not present

## 2018-09-25 DIAGNOSIS — N870 Mild cervical dysplasia: Secondary | ICD-10-CM | POA: Diagnosis not present

## 2018-09-25 DIAGNOSIS — N3946 Mixed incontinence: Secondary | ICD-10-CM

## 2018-09-25 DIAGNOSIS — D060 Carcinoma in situ of endocervix: Secondary | ICD-10-CM

## 2018-09-25 DIAGNOSIS — Z1231 Encounter for screening mammogram for malignant neoplasm of breast: Secondary | ICD-10-CM

## 2018-09-25 MED ORDER — TOLTERODINE TARTRATE ER 4 MG PO CP24: 4.0000 mg | ORAL_CAPSULE | Freq: Every day | ORAL | 11 refills | Status: DC

## 2018-09-25 NOTE — Patient Instructions (Signed)
Colposcopy, Care After This sheet gives you information about how to care for yourself after your procedure. Your health care provider may also give you more specific instructions. If you have problems or questions, contact your health care provider. What can I expect after the procedure? If you had a colposcopy without a biopsy, you can expect to feel fine right away, but you may have some spotting for a few days. You can go back to your normal activities. If you had a colposcopy with a biopsy, it is common to have:  Soreness and pain. This may last for a few days.  Light-headedness.  Mild vaginal bleeding or dark-colored, grainy discharge. This may last for a few days. The discharge may be due to a solution that was used during the procedure. You may need to wear a sanitary pad during this time.  Spotting for at least 48 hours after the procedure. Follow these instructions at home:   Take over-the-counter and prescription medicines only as told by your health care provider. Talk with your health care provider about what type of over-the-counter pain medicine and prescription medicine you can start taking again. It is especially important to talk with your health care provider if you take blood-thinning medicine.  Do not drive or use heavy machinery while taking prescription pain medicine.  For at least 3 days after your procedure, or as long as told by your health care provider, avoid: ? Douching. ? Using tampons. ? Having sexual intercourse.  Continue to use birth control (contraception).  Limit your physical activity for the first day after the procedure as told by your health care provider. Ask your health care provider what activities are safe for you.  It is up to you to get the results of your procedure. Ask your health care provider, or the department performing the procedure, when your results will be ready.  Keep all follow-up visits as told by your health care provider.  This is important. Contact a health care provider if:  You develop a skin rash. Get help right away if:  You are bleeding heavily from your vagina or you are passing blood clots. This includes using more than one sanitary pad per hour for 2 hours in a row.  You have a fever or chills.  You have pelvic pain.  You have abnormal, yellow-colored, or bad-smelling vaginal discharge. This could be a sign of infection.  You have severe pain or cramps in your lower abdomen that do not get better with medicine.  You feel light-headed or dizzy, or you faint. Summary  If you had a colposcopy without a biopsy, you can expect to feel fine right away, but you may have some spotting for a few days. You can go back to your normal activities.  If you had a colposcopy with a biopsy, you may notice mild pain and spotting for 48 hours after the procedure.  Avoid douching, using tampons, and having sexual intercourse for 3 days after the procedure or as long as told by your health care provider.  Contact your health care provider if you have bleeding, severe pain, or signs of infection. This information is not intended to replace advice given to you by your health care provider. Make sure you discuss any questions you have with your health care provider. Document Released: 12/31/2012 Document Revised: 02/22/2017 Document Reviewed: 10/28/2015 Elsevier Patient Education  2020 [employer].

## 2018-09-25 NOTE — Progress Notes (Signed)
Referring Provider:  [facility], Dr Doran Stabler  HPI:  Marie [name] is a 53 y.o.  [phone]  who presents today for evaluation and management of abnormal cervical cytology.    Also reports worsening urinary incontinence.. Prior Sling 15 years ago.  Recent urgency and nocturia, also LOU w cough and sneeze.    Dysplasia History:  LGSIL last year, did not follow up for evaluation initially  ROS:  Pertinent items are noted in HPI.  OB History  Gravida Para Term Preterm AB Living  3 3 3     3   SAB TAB Ectopic Multiple Live Births               # Outcome Date GA Lbr Len/2nd Weight Sex Delivery Anes PTL Lv  3 Term           2 Term           1 Term             Past Medical History:  Diagnosis Date  . CHF (congestive heart failure) ([place])   . Chronic pancreatitis ([place])   . Coronary atherosclerosis of unspecified type of vessel, native or graft   . AVWUJWJX(914.7)    "weekly" (07/29/2013)  . Hypertension   . Leiomyoma of uterus, unspecified   . Loss of weight   . Obesity, unspecified   . OSA (obstructive sleep apnea)    no CPAP  . Pure hypercholesterolemia   . Sarcoidosis   . Shortness of breath    none since stent 2 yrs ago  . Type II diabetes mellitus ([place])     Past Surgical History:  Procedure Laterality Date  . CORONARY ANGIOPLASTY WITH STENT PLACEMENT  09/2009   "1"  . CORONARY ARTERY BYPASS GRAFT  ~ 2000   CABG X3  . HYSTEROSCOPY WITH NOVASURE N/A 09/04/2012   Procedure: HYSTEROSCOPY WITH NOVASURE;  Surgeon: Mora Bellman, MD;  Location: [place] [facility];  Service: Gynecology;  Laterality: N/A;  with removal intrauterine device  . ROBOTIC ASSISTED LAPAROSCOPIC CHOLECYSTECTOMY-MULTI SITE N/A 01/14/2018   Procedure: ROBOTIC ASSISTED LAPAROSCOPIC CHOLECYSTECTOMY-MULTI SITE;  Surgeon: Jules Husbands, MD;  Location: [facility];  Service: General;  Laterality: N/A;  . TUBAL LIGATION  1997    SOCIAL HISTORY:  Social History   Substance and Sexual Activity  Alcohol Use  Yes  . Alcohol/week: 24.0 standard drinks  . Types: 24 Glasses of wine per week   Comment: 08/28/2016 "3-4 glasses of wine qd; last glass was last night",    Social History   Substance and Sexual Activity  Drug Use No     Family History  Problem Relation Age of Onset  . Diabetes Mother   . Coronary artery disease Mother   . Heart disease Mother   . Hyperlipidemia Mother   . Sudden death Mother   . Colon cancer Maternal Grandfather   . Liver disease Maternal Uncle   . Heart disease Maternal Uncle   . Heart disease Paternal Aunt   . Stroke Maternal Grandmother   . Breast cancer Neg Hx     ALLERGIES:  Lisinopril  Current Outpatient Medications on File Prior to Visit  Medication Sig Dispense Refill  . clopidogrel (PLAVIX) 75 MG tablet Take 75 mg by mouth daily.    . hydrochlorothiazide (HYDRODIURIL) 25 MG tablet Take 25 mg by mouth daily.     Marland Kitchen HYDROcodone-acetaminophen (NORCO/VICODIN) 5-325 MG tablet Take 1-2 tablets by mouth every 6 (six) hours as needed for moderate  pain. 30 tablet 0  . losartan (COZAAR) 50 MG tablet Take 50 mg by mouth daily.    . metFORMIN (GLUCOPHAGE) 500 MG tablet Take 500 mg by mouth daily with breakfast.     . metoprolol (LOPRESSOR) 100 MG tablet Take 100 mg by mouth daily.   0  . nitroGLYCERIN (NITROSTAT) 0.4 MG SL tablet Place 1 tablet (0.4 mg total) under the tongue every 5 (five) minutes as needed for chest pain. Maximum of 3 doses. 25 tablet 2  . ondansetron (ZOFRAN) 4 MG tablet Take 1 tablet (4 mg total) by mouth every 6 (six) hours as needed for nausea. 20 tablet 0  . pravastatin (PRAVACHOL) 20 MG tablet Take 20 mg by mouth daily.     Marland Kitchen oxyCODONE-acetaminophen (PERCOCET) 5-325 MG tablet Take 1 tablet by mouth every 4 (four) hours as needed for severe pain. (Patient not taking: Reported on 09/25/2018) 20 tablet 0   No current facility-administered medications on file prior to visit.     Physical Exam: -Vitals:  BP 100/60   Ht 5\' 2"  (1.575 m)    Wt 114 lb (51.7 kg)   BMI 20.85 kg/m  GEN: WD, WN, NAD.  A+ O x 3, good mood and affect. ABD:  NT, ND.  Soft, no masses.  No hernias noted.   Pelvic:   Vulva: Normal appearance.  No lesions.  Vagina: No lesions or abnormalities noted.  Support: Normal pelvic support.  Urethra No masses tenderness or scarring.  Meatus Normal size without lesions or prolapse.  Cervix: See below.  Anus: Normal exam.  No lesions.  Perineum: Normal exam.  No lesions.        Bimanual   Uterus: Normal size.  Non-tender.  Mobile.  AV.  Adnexae: No masses.  Non-tender to palpation.  Cul-de-sac: Negative for abnormality.   PROCEDURE: 1.  Urine Pregnancy Test:  not done 2.  Colposcopy performed with 4% acetic acid after verbal consent obtained                                         -Aceto-white Lesions Location(s): none.              -Biopsy performed at 6, 12 o'clock               -ECC indicated and performed: Yes.       -Biopsy sites made hemostatic with pressure, AgNO3, and/or Monsel's solution   -Satisfactory colposcopy: Yes.      -Evidence of Invasive cervical CA :  NO  ASSESSMENT:  Marie [name] is a 53 y.o. G3P3003 here for  1. LGSIL on Pap smear of cervix   2. Mixed stress and urge urinary incontinence    PLAN: 1.  I discussed the grading system of pap smears and HPV high risk viral types.  We will discuss and base management after colpo results return. 2. Follow up PAP 6 months, vs intervention if high grade dysplasia identified 3. Treatment of persistantly abnormal PAP smears and cervical dysplasia, even mild, is discussed w pt today in detail, as well as the pros and cons of Cryo and LEEP procedures. Will consider and discuss after results. 4. Plan Detrol LA for OAB component of bladder sx's.  Give 2 mos to see results, side effects discussed.      [name], MD, [facility], [facility] 09/25/2018  10:46 AM

## 2018-09-30 DIAGNOSIS — H524 Presbyopia: Secondary | ICD-10-CM | POA: Diagnosis not present

## 2018-09-30 NOTE — Progress Notes (Signed)
Colpo results Diagnosis 1. 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 2. Cervix, biopsy, 12 o'clock - LOW GRADE SQUAMOUS INTRAEPITHELIAL LESION, CIN I (MILD DYSPLASIA). 3. Cervix, biopsy, 6 o'clock - LOW GRADE SQUAMOUS INTRAEPITHELIAL LESION, CIN I (MILD DYSPLASIA). Will discuss need for LEEP LM  [name], MD, [facility], [facility] 09/30/2018  11:23 AM

## 2018-10-01 ENCOUNTER — Telehealth: Payer: Self-pay

## 2018-10-01 ENCOUNTER — Other Ambulatory Visit: Payer: Self-pay | Admitting: Obstetrics & Gynecology

## 2018-10-01 MED ORDER — OXYBUTYNIN CHLORIDE ER 5 MG PO TB24: 5.0000 mg | ORAL_TABLET | Freq: Every day | ORAL | 6 refills | Status: DC

## 2018-10-01 NOTE — Telephone Encounter (Signed)
1. We have called in alternative medicine that should be covered to [place];  have her give it a try 2. Is this good number for Dr Kenton Kingfisher to call you regarding biopsy results?

## 2018-10-01 NOTE — Telephone Encounter (Signed)
Pt calling; ins doesn't cover med rx'd Fri; states ins tried to get [place] to change to medication they will cover and we wouldn't change it.  Pt doesn't have $112 to pay for medication rx'd.  [phone]

## 2018-10-02 ENCOUNTER — Telehealth: Payer: Self-pay | Admitting: Obstetrics & Gynecology

## 2018-10-02 NOTE — Progress Notes (Signed)
Surgery Booking Request Patient Full Name:  Marie [name]  MRN: [id number]  DOB: 1965-11-14  Surgeon: Hoyt Koch, MD  Requested Surgery Date and Time: Soonest available (even 7/14) Primary Diagnosis AND Code: Severe cervical dysplasia Secondary Diagnosis and Code:  Surgical Procedure: LEEP L&D Notification: No Admission Status: same day surgery Length of Surgery: 20 min Special Case Needs: LEEP H&P: yes (date) Phone Interview???: yes Interpreter: Language:  Medical Clearance: no Special Scheduling Instructions: no Acuity: P2 - cancer risk

## 2018-10-02 NOTE — Telephone Encounter (Signed)
No answer , vm is full

## 2018-10-02 NOTE — Telephone Encounter (Signed)
Called pt - no answer / mail box full

## 2018-10-02 NOTE — Telephone Encounter (Signed)
-----   Message from Gae Dry, MD sent at 10/02/2018  3:17 PM EDT ----- Surgery Booking Request Patient Full Name:  Marie [name]  MRN: [id number]  DOB: 1966-03-05  Surgeon: Hoyt Koch, MD  Requested Surgery Date and Time: Soonest available (even 7/14) Primary Diagnosis AND Code: Severe cervical dysplasia Secondary Diagnosis and Code:  Surgical Procedure: LEEP L&D Notification: No Admission Status: same day surgery Length of Surgery: 20 min Special Case Needs: LEEP H&P: yes (date) Phone Interview???: yes Interpreter: Language:  Medical Clearance: no Special Scheduling Instructions: no Acuity: P2 - cancer risk

## 2018-10-02 NOTE — Telephone Encounter (Signed)
Should cover Oxybutynin as that ios the one CVE requested to change it to   Is this the one that is 120$?

## 2018-10-02 NOTE — Telephone Encounter (Signed)
Pt states the pharmacy said her insurance will not cover the rx and its 120 dollars, this is a good number for you to call her at with results.

## 2018-10-02 NOTE — Telephone Encounter (Signed)
I called pharmacy and the rx is 5 dollars

## 2018-10-02 NOTE — Telephone Encounter (Signed)
Pt aware.

## 2018-10-06 NOTE — Telephone Encounter (Signed)
Patient is aware of H&P on 10/14/18 @ 11:20am w/ Dr. Kenton Kingfisher, Pre-admit Testing phone interview and COVID testing to be scheduled, and Or on 10/21/18. Patient is aware she will be asked to quarantine after COVID testing. Patient is aware she may receive calls from the [place] and [facility]. Patient confirmed UMR, and no secondary insurance.

## 2018-10-08 ENCOUNTER — Telehealth: Payer: Self-pay | Admitting: Obstetrics & Gynecology

## 2018-10-08 NOTE — Telephone Encounter (Signed)
Attempted to reach the patient to let her know her [facility] insurance is inactive. Lmtrc.

## 2018-10-08 NOTE — Telephone Encounter (Signed)
Patient returned the call. Patient rescheduled her surgery to 10/28/18, and H&P is rescheduled for 10/23/18 @ 8:20am. Patient will check on insurance, and will let me know what she finds out. Patient is aware insurance info is needed to check for authorization.

## 2018-10-14 ENCOUNTER — Encounter: Payer: 59 | Admitting: Obstetrics & Gynecology

## 2018-10-14 ENCOUNTER — Other Ambulatory Visit: Payer: 59

## 2018-10-14 DIAGNOSIS — J449 Chronic obstructive pulmonary disease, unspecified: Secondary | ICD-10-CM | POA: Diagnosis not present

## 2018-10-17 ENCOUNTER — Other Ambulatory Visit: Payer: 59

## 2018-10-20 NOTE — Telephone Encounter (Signed)
Attempted to reach the patient to see if she has updated Environmental consultant. No answer, v/m is full.

## 2018-10-23 ENCOUNTER — Encounter: Payer: Self-pay | Admitting: Obstetrics & Gynecology

## 2018-10-23 ENCOUNTER — Encounter: Admission: RE | Admit: 2018-10-23 | Payer: 59 | Source: Ambulatory Visit

## 2018-10-23 DIAGNOSIS — D069 Carcinoma in situ of cervix, unspecified: Secondary | ICD-10-CM | POA: Insufficient documentation

## 2018-10-23 NOTE — Progress Notes (Deleted)
PRE-OPERATIVE HISTORY AND PHYSICAL EXAM  HPI:  Marie [name] is a 53 y.o. G3P3003 No LMP recorded. Patient has had an ablation.; she is being admitted for surgery related to cervical dysplasia. Colpo / Biopsy Diagnosis 1. 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 2. Cervix, biopsy, 12 o'clock - LOW GRADE SQUAMOUS INTRAEPITHELIAL LESION, CIN I (MILD DYSPLASIA). 3. Cervix, biopsy, 6 o'clock - LOW GRADE SQUAMOUS INTRAEPITHELIAL LESION, CIN I (MILD DYSPLASIA).  PMHx: Past Medical History:  Diagnosis Date  . CHF (congestive heart failure) ([place])   . Chronic pancreatitis ([place])   . Coronary atherosclerosis of unspecified type of vessel, native or graft   . YBWLSLHT(342.8)    "weekly" (07/29/2013)  . Hypertension   . Leiomyoma of uterus, unspecified   . Loss of weight   . Obesity, unspecified   . OSA (obstructive sleep apnea)    no CPAP  . Pure hypercholesterolemia   . Sarcoidosis   . Shortness of breath    none since stent 2 yrs ago  . Type II diabetes mellitus ([place])    Past Surgical History:  Procedure Laterality Date  . CORONARY ANGIOPLASTY WITH STENT PLACEMENT  09/2009   "1"  . CORONARY ARTERY BYPASS GRAFT  ~ 2000   CABG X3  . HYSTEROSCOPY WITH NOVASURE N/A 09/04/2012   Procedure: HYSTEROSCOPY WITH NOVASURE;  Surgeon: Mora Bellman, MD;  Location: [place] [facility];  Service: Gynecology;  Laterality: N/A;  with removal intrauterine device  . ROBOTIC ASSISTED LAPAROSCOPIC CHOLECYSTECTOMY-MULTI SITE N/A 01/14/2018   Procedure: ROBOTIC ASSISTED LAPAROSCOPIC CHOLECYSTECTOMY-MULTI SITE;  Surgeon: Jules Husbands, MD;  Location: [facility];  Service: General;  Laterality: N/A;  . TUBAL LIGATION  1997   Family History  Problem Relation Age of Onset  . Diabetes Mother   . Coronary artery disease Mother   . Heart disease Mother   . Hyperlipidemia Mother   . Sudden death Mother   . Colon cancer Maternal Grandfather    . Liver disease Maternal Uncle   . Heart disease Maternal Uncle   . Heart disease Paternal Aunt   . Stroke Maternal Grandmother   . Breast cancer Neg Hx    Social History   Tobacco Use  . Smoking status: Current Every Day Smoker    Packs/day: 0.50    Years: 32.00    Pack years: 16.00    Types: Cigarettes  . Smokeless tobacco: Never Used  Substance Use Topics  . Alcohol use: Yes    Alcohol/week: 24.0 standard drinks    Types: 24 Glasses of wine per week    Comment: 08/28/2016 "3-4 glasses of wine qd; last glass was last night",  . Drug use: No    Current Outpatient Medications:  .  clopidogrel (PLAVIX) 75 MG tablet, Take 75 mg by mouth daily., Disp: , Rfl:  .  hydrochlorothiazide (HYDRODIURIL) 25 MG tablet, Take 25 mg by mouth daily. , Disp: , Rfl:  .  HYDROcodone-acetaminophen (NORCO/VICODIN) 5-325 MG tablet, Take 1-2 tablets by mouth every 6 (six) hours as needed for moderate pain., Disp: 30 tablet, Rfl: 0 .  losartan (COZAAR) 50 MG tablet, Take 50 mg by mouth daily., Disp: , Rfl:  .  metFORMIN (GLUCOPHAGE) 500 MG tablet, Take 500 mg by mouth daily with breakfast. , Disp: , Rfl:  .  metoprolol (LOPRESSOR) 100 MG tablet, Take 100 mg by mouth daily. , Disp: , Rfl: 0 .  nitroGLYCERIN (NITROSTAT) 0.4 MG SL tablet,  Place 1 tablet (0.4 mg total) under the tongue every 5 (five) minutes as needed for chest pain. Maximum of 3 doses., Disp: 25 tablet, Rfl: 2 .  ondansetron (ZOFRAN) 4 MG tablet, Take 1 tablet (4 mg total) by mouth every 6 (six) hours as needed for nausea., Disp: 20 tablet, Rfl: 0 .  oxybutynin (DITROPAN-XL) 5 MG 24 hr tablet, Take 1 tablet (5 mg total) by mouth at bedtime., Disp: 30 tablet, Rfl: 6 .  pravastatin (PRAVACHOL) 20 MG tablet, Take 20 mg by mouth daily. , Disp: , Rfl:  Allergies: Lisinopril  Review of Systems  Constitutional: Negative for chills, fever and malaise/fatigue.  HENT: Negative for congestion, sinus pain and sore throat.   Eyes: Negative for blurred  vision and pain.  Respiratory: Negative for cough and wheezing.   Cardiovascular: Negative for chest pain and leg swelling.  Gastrointestinal: Negative for abdominal pain, constipation, diarrhea, heartburn, nausea and vomiting.  Genitourinary: Negative for dysuria, frequency, hematuria and urgency.  Musculoskeletal: Negative for back pain, joint pain, myalgias and neck pain.  Skin: Negative for itching and rash.  Neurological: Negative for dizziness, tremors and weakness.  Endo/Heme/Allergies: Does not bruise/bleed easily.  Psychiatric/Behavioral: Negative for depression. The patient is not nervous/anxious and does not have insomnia.     Objective: There were no vitals taken for this visit. There were no vitals filed for this visit. Physical Exam Constitutional:      General: She is not in acute distress.    Appearance: She is well-developed.  HENT:     Head: Normocephalic and atraumatic. No laceration.     Right Ear: Hearing normal.     Left Ear: Hearing normal.     Mouth/Throat:     Pharynx: Uvula midline.  Eyes:     Pupils: Pupils are equal, round, and reactive to light.  Neck:     Musculoskeletal: Normal range of motion and neck supple.     Thyroid: No thyromegaly.  Cardiovascular:     Rate and Rhythm: Normal rate and regular rhythm.     Heart sounds: No murmur. No friction rub. No gallop.   Pulmonary:     Effort: Pulmonary effort is normal. No respiratory distress.     Breath sounds: Normal breath sounds. No wheezing.  Chest:     Breasts:        Right: No mass, skin change or tenderness.        Left: No mass, skin change or tenderness.  Abdominal:     General: Bowel sounds are normal. There is no distension.     Palpations: Abdomen is soft.     Tenderness: There is no abdominal tenderness. There is no rebound.  Musculoskeletal: Normal range of motion.  Neurological:     Mental Status: She is alert and oriented to person, place, and time.     Cranial Nerves: No  cranial nerve deficit.  Skin:    General: Skin is warm and dry.  Psychiatric:        Judgment: Judgment normal.  Vitals signs reviewed.     Assessment: 1. CIN III (cervical intraepithelial neoplasia grade III) with severe dysplasia   Plan LEEP  I have had a careful discussion with this patient about all the options available and the risk/benefits of each. I have fully informed this patient that surgery may subject her to a variety of discomforts and risks: She understands that most patients have surgery with little difficulty, but problems can happen ranging from minor to fatal.  These include nausea, vomiting, pain, bleeding, infection, poor healing, hernia, or formation of adhesions. Unexpected reactions may occur from any drug or anesthetic given. Unintended injury may occur to other pelvic or abdominal structures such as Fallopian tubes, ovaries, bladder, ureter (tube from kidney to bladder), or bowel. Nerves going from the pelvis to the legs may be injured. Any such injury may require immediate or later additional surgery to correct the problem. Excessive blood loss requiring transfusion is very unlikely but possible. Dangerous blood clots may form in the legs or lungs. Physical and sexual activity will be restricted in varying degrees for an indeterminate period of time but most often 2-6 weeks.  Finally, she understands that it is impossible to list every possible undesirable effect and that the condition for which surgery is done is not always cured or significantly improved, and in rare cases may be even worse.Ample time was given to answer all questions.  [name], MD, [facility], [facility] 10/23/2018  8:32 AM

## 2018-10-24 ENCOUNTER — Inpatient Hospital Stay: Admission: RE | Admit: 2018-10-24 | Payer: 59 | Source: Ambulatory Visit

## 2018-10-24 ENCOUNTER — Telehealth: Payer: Self-pay

## 2018-10-24 ENCOUNTER — Other Ambulatory Visit: Payer: 59

## 2018-10-24 ENCOUNTER — Encounter: Admission: RE | Admit: 2018-10-24 | Payer: 59 | Source: Ambulatory Visit

## 2018-10-24 NOTE — Telephone Encounter (Signed)
Judeen Hammans called to say they have tried to contact pt regarding surgery on 10/28/18 and haven't been able to. They spoke to someone here at our office and was given another number, so she will call pt again.

## 2018-10-24 NOTE — Telephone Encounter (Signed)
LMVM for [facility] w/P.A.T @ [place] to verify if she was able to reach patient. Advised to contact me if she needed any additional information.

## 2018-10-24 NOTE — Pre-Procedure Instructions (Signed)
It has been recommended by cardiology this patient have a stress test prior to another procedure. Contacted both Dr Amie Critchley and Dr Kenton Kingfisher regarding this.  Dr Amie Critchley  agrees patient should have stress test as recommended by Dr End prior to her surgical procedure with Dr Kenton Kingfisher as well. Patient will be rescheduled once cardiac clearance has been obtained. She will still need PAT visit as we have not been able to contact her after several attempts.

## 2018-10-27 ENCOUNTER — Inpatient Hospital Stay: Admission: RE | Admit: 2018-10-27 | Payer: 59 | Source: Ambulatory Visit

## 2018-10-28 ENCOUNTER — Ambulatory Visit: Admission: RE | Admit: 2018-10-28 | Payer: 59 | Source: Home / Self Care | Admitting: Obstetrics & Gynecology

## 2018-10-28 ENCOUNTER — Encounter: Admission: RE | Payer: Self-pay | Source: Home / Self Care

## 2018-10-28 SURGERY — LEEP (LOOP ELECTROSURGICAL EXCISION PROCEDURE)
Anesthesia: Choice

## 2018-11-07 ENCOUNTER — Other Ambulatory Visit: Payer: Self-pay | Admitting: Obstetrics & Gynecology

## 2018-11-07 ENCOUNTER — Telehealth: Payer: Self-pay | Admitting: Obstetrics & Gynecology

## 2018-11-07 NOTE — Telephone Encounter (Signed)
We will be able to complete clearance form after stress test has been completed.  Nelva Bush, MD [facility] Pager: [phone]

## 2018-11-07 NOTE — Telephone Encounter (Signed)
Patient returned the call and will contact Dr. Darnelle Bos office to reschedule the stress test previously ordered, and for cardiac clearance for the LEEP procedure.

## 2018-11-07 NOTE — Telephone Encounter (Signed)
Attempted to reach the patient regarding cardiac clearance w/ Dr. Saunders Revel and rescheduling surgery. Lmtrc.

## 2018-11-14 ENCOUNTER — Other Ambulatory Visit: Payer: Self-pay

## 2018-11-14 ENCOUNTER — Ambulatory Visit
Admission: RE | Admit: 2018-11-14 | Discharge: 2018-11-14 | Disposition: A | Discharge status: Home / Self Care | Payer: Self-pay | Source: Ambulatory Visit | Attending: Internal Medicine | Admitting: Internal Medicine

## 2018-11-14 DIAGNOSIS — R0789 Other chest pain: Secondary | ICD-10-CM | POA: Insufficient documentation

## 2018-11-14 DIAGNOSIS — Z01810 Encounter for preprocedural cardiovascular examination: Secondary | ICD-10-CM | POA: Insufficient documentation

## 2018-11-14 DIAGNOSIS — R0602 Shortness of breath: Secondary | ICD-10-CM | POA: Insufficient documentation

## 2018-11-14 DIAGNOSIS — I2510 Atherosclerotic heart disease of native coronary artery without angina pectoris: Secondary | ICD-10-CM | POA: Insufficient documentation

## 2018-11-18 ENCOUNTER — Other Ambulatory Visit: Payer: Self-pay

## 2018-11-18 ENCOUNTER — Ambulatory Visit (HOSPITAL_BASED_OUTPATIENT_CLINIC_OR_DEPARTMENT_OTHER)
Admission: RE | Admit: 2018-11-18 | Discharge: 2018-11-18 | Disposition: A | Discharge status: Home / Self Care | Payer: Self-pay | Source: Ambulatory Visit | Attending: Internal Medicine | Admitting: Internal Medicine

## 2018-11-18 DIAGNOSIS — R0789 Other chest pain: Secondary | ICD-10-CM

## 2018-11-18 DIAGNOSIS — R0602 Shortness of breath: Secondary | ICD-10-CM

## 2018-11-18 DIAGNOSIS — Z01810 Encounter for preprocedural cardiovascular examination: Secondary | ICD-10-CM

## 2018-11-18 DIAGNOSIS — I2510 Atherosclerotic heart disease of native coronary artery without angina pectoris: Secondary | ICD-10-CM

## 2018-11-18 LAB — NM MYOCAR MULTI W/SPECT W/WALL MOTION / EF
Estimated workload: 1 METS
LV dias vol: 43 mL (ref 46–106)
LV sys vol: 14 mL
MPHR: 168 {beats}/min
Peak HR: 104 {beats}/min
Percent HR: 61 %
Rest HR: 69 {beats}/min
TID: 1.12

## 2018-11-18 MED ORDER — TECHNETIUM TC 99M TETROFOSMIN IV KIT
31.7930 | PACK | Freq: Once | INTRAVENOUS | Status: AC | PRN
  Administered 2018-11-18: 31.793 via INTRAVENOUS

## 2018-11-18 MED ORDER — TECHNETIUM TC 99M TETROFOSMIN IV KIT
10.0000 | PACK | Freq: Once | INTRAVENOUS | Status: AC | PRN
  Administered 2018-11-18: 10.81 via INTRAVENOUS

## 2018-11-18 MED ORDER — REGADENOSON 0.4 MG/5ML IV SOLN
0.4000 mg | Freq: Once | INTRAVENOUS | Status: AC
  Administered 2018-11-18: 10:00:00 0.4 mg via INTRAVENOUS

## 2018-11-18 NOTE — Telephone Encounter (Signed)
Patient called to say her husband retired from the hospital 7/15, and thought their insurance was active until the end of August, but it actually termed the end of July.  Patient wanted to know payment options. Patient was advised [place] offers a 55% self pay discount, that she can have the procedure in the office or in the hospital, that she can call [facility] Patient Financial Services for options, and that she could contact [employer] after she receives her statement to set up a payment plan. Patient plans to call [facility] Patient Financial Services for options and questions, including whether she can receive a self pay discount along with using [employer]. Patient also said she had her procedure today for Dr. Saunders Revel.

## 2018-11-19 ENCOUNTER — Telehealth: Payer: Self-pay | Admitting: Internal Medicine

## 2018-11-19 NOTE — Telephone Encounter (Signed)
Patient calling to check on status of myoview results  Please call to discuss

## 2018-11-19 NOTE — Telephone Encounter (Signed)
Patient called to say she spoke to someone at [facility] Patient Financial Services and is ready to schedule once she is cleared by Dr. Saunders Revel. Patient intends to call Dr. Darnelle Bos office this afternoon.

## 2018-11-20 NOTE — Telephone Encounter (Signed)
Results of myoview called to patient and she verbalized understanding. See full details in result note.

## 2018-11-28 ENCOUNTER — Telehealth: Payer: Self-pay | Admitting: Internal Medicine

## 2018-11-28 NOTE — Telephone Encounter (Signed)
Per patient, she was told by someone at Dr. Darnelle Bos office that they were faxing her clearance. Neither [facility] nor Pre-admit Testing have received the clearance. Patient will call Dr. Darnelle Bos office. My fax# was given, along with the phone# to Dr. Darnelle Bos office.

## 2018-11-28 NOTE — Telephone Encounter (Signed)
Patient would like Stress test results to be sent to Dr. Kenton Kingfisher fax [phone]

## 2018-11-28 NOTE — Telephone Encounter (Signed)
Spoke with patient and she states that she needs clearance for her upcoming procedure not the stress test faxed. Advised that is totally different and I would need to send back to provider for his review and clearance information. She verbalized understanding with no further questions at this time.

## 2018-12-01 NOTE — Telephone Encounter (Signed)
Please forward to the preop pool.  Thanks.  Nelva Bush, MD [facility] Pager: [phone]

## 2018-12-02 NOTE — Telephone Encounter (Signed)
   [facility] Pre-operative Risk Assessment    Request for surgical clearance:  1. What type of surgery is being performed? LEEP  2. When is this surgery scheduled? TBD   3. What type of clearance is required (medical clearance vs. Pharmacy clearance to hold med vs. Both)? Medical  4. Are there any medications that need to be held prior to surgery and how long?   5. Practice name and name of physician performing surgery? [facility], Dr. Kenton Kingfisher   6. What is your office phone number? [phone]    7.   What is your office fax number? [phone]  8.   Anesthesia type (None, local, MAC, general) ? Choice   Marie [name] 12/02/2018, 4:28 PM  _________________________________________________________________   (provider comments below)

## 2018-12-02 NOTE — Telephone Encounter (Signed)
   Primary Cardiologist: Ida Rogue, MD  Chart reviewed as part of pre-operative protocol coverage. She underwent nuclear stress test on 11/18/18 that did not show any significant abnormalities. Per Dr. Rockey Situ, [place] to proceed with procedure without additional testing.   I will route this recommendation to the requesting party via Epic fax function and remove from pre-op pool.  Please call with questions.  [place], PA 12/02/2018, 5:09 PM

## 2018-12-16 ENCOUNTER — Telehealth: Payer: Self-pay | Admitting: Obstetrics & Gynecology

## 2018-12-16 NOTE — Telephone Encounter (Signed)
Patient is aware of H&P on 12/18/18 @ 3:10pm w/ Dr. Kenton Kingfisher, Pre-admit testing to be scheduled, Covid testing on 12/22/18 @ 8-10:30am, and OR on 12/25/18. Patient is aware she will be asked to quarantine after Covid testing. Patient is aware she may receive calls from the [place] and [facility]. Patient has contacted [facility] Patient Financial Services and has paperwork for a payment option.

## 2018-12-18 ENCOUNTER — Other Ambulatory Visit: Payer: Self-pay

## 2018-12-18 ENCOUNTER — Ambulatory Visit (INDEPENDENT_AMBULATORY_CARE_PROVIDER_SITE_OTHER): Payer: Self-pay | Admitting: Obstetrics & Gynecology

## 2018-12-18 ENCOUNTER — Encounter: Payer: Self-pay | Admitting: Obstetrics & Gynecology

## 2018-12-18 VITALS — BP 140/80 | Ht 62.0 in | Wt 112.0 lb

## 2018-12-18 DIAGNOSIS — D069 Carcinoma in situ of cervix, unspecified: Secondary | ICD-10-CM

## 2018-12-18 NOTE — Progress Notes (Signed)
PRE-OPERATIVE HISTORY AND PHYSICAL EXAM  HPI:  Marie [name] is a 53 y.o. G3P3003 No LMP recorded. Patient has had an ablation.; she is being admitted for surgery related to cervical dysplasia.  Pthad a recent PAP LGSIL.  This was followed by a biopsy procedure.  Results as follows: Diagnosis  1. 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  2. Cervix, biopsy, 12 o'clock  - LOW GRADE SQUAMOUS INTRAEPITHELIAL LESION, CIN I (MILD DYSPLASIA).  3. Cervix, biopsy, 6 o'clock  - LOW GRADE SQUAMOUS INTRAEPITHELIAL LESION, CIN I (MILD DYSPLASIA).   No prior h/o cervical disease.  Options counseled at length as to surgery, cryo, expectant management; office based surgery vs hospital based surgery; recovery.  PMHx: Past Medical History:  Diagnosis Date  . CHF (congestive heart failure) ([place])   . Chronic pancreatitis ([place])   . Coronary atherosclerosis of unspecified type of vessel, native or graft   . [id number]    "weekly" (07/29/2013)  . Hypertension   . Leiomyoma of uterus, unspecified   . Loss of weight   . Obesity, unspecified   . OSA (obstructive sleep apnea)    no CPAP  . Pure hypercholesterolemia   . Sarcoidosis   . Shortness of breath    none since stent 2 yrs ago  . Type II diabetes mellitus ([place])    Past Surgical History:  Procedure Laterality Date  . CORONARY ANGIOPLASTY WITH STENT PLACEMENT  09/2009   "1"  . CORONARY ARTERY BYPASS GRAFT  ~ 2000   CABG X3  . HYSTEROSCOPY WITH NOVASURE N/A 09/04/2012   Procedure: HYSTEROSCOPY WITH NOVASURE;  Surgeon: Mora Bellman, MD;  Location: [facility];  Service: Gynecology;  Laterality: N/A;  with removal intrauterine device  . ROBOTIC ASSISTED LAPAROSCOPIC CHOLECYSTECTOMY-MULTI SITE N/A 01/14/2018   Procedure: ROBOTIC ASSISTED LAPAROSCOPIC CHOLECYSTECTOMY-MULTI SITE;  Surgeon: Jules Husbands, MD;  Location: [facility];  Service: General;  Laterality: N/A;  .  TUBAL LIGATION  1997   Family History  Problem Relation Age of Onset  . Diabetes Mother   . Coronary artery disease Mother   . Heart disease Mother   . Hyperlipidemia Mother   . Sudden death Mother   . Colon cancer Maternal Grandfather   . Liver disease Maternal Uncle   . Heart disease Maternal Uncle   . Heart disease Paternal Aunt   . Stroke Maternal Grandmother   . Breast cancer Neg Hx    Social History   Tobacco Use  . Smoking status: Current Every Day Smoker    Packs/day: 0.50    Years: 32.00    Pack years: 16.00    Types: Cigarettes  . Smokeless tobacco: Never Used  Substance Use Topics  . Alcohol use: Yes    Alcohol/week: 24.0 standard drinks    Types: 24 Glasses of wine per week    Comment: 08/28/2016 "3-4 glasses of wine qd; last glass was last night",  . Drug use: No    Current Outpatient Medications:  .  clopidogrel (PLAVIX) 75 MG tablet, Take 75 mg by mouth daily., Disp: , Rfl:  .  hydrochlorothiazide (HYDRODIURIL) 25 MG tablet, Take 25 mg by mouth daily. , Disp: , Rfl:  .  HYDROcodone-acetaminophen (NORCO/VICODIN) 5-325 MG tablet, Take 1-2 tablets by mouth every 6 (six) hours as needed for moderate pain., Disp: 30 tablet, Rfl: 0 .  losartan (COZAAR) 50 MG tablet, Take 50 mg by mouth daily., Disp: , Rfl:  .  metFORMIN (GLUCOPHAGE) 500 MG tablet, Take 500 mg by mouth daily with breakfast. , Disp: , Rfl:  .  metoprolol (LOPRESSOR) 100 MG tablet, Take 100 mg by mouth daily. , Disp: , Rfl: 0 .  nitroGLYCERIN (NITROSTAT) 0.4 MG SL tablet, Place 1 tablet (0.4 mg total) under the tongue every 5 (five) minutes as needed for chest pain. Maximum of 3 doses., Disp: 25 tablet, Rfl: 2 .  ondansetron (ZOFRAN) 4 MG tablet, Take 1 tablet (4 mg total) by mouth every 6 (six) hours as needed for nausea., Disp: 20 tablet, Rfl: 0 .  oxybutynin (DITROPAN-XL) 5 MG 24 hr tablet, Take 1 tablet (5 mg total) by mouth at bedtime., Disp: 30 tablet, Rfl: 6 .  pravastatin (PRAVACHOL) 20 MG  tablet, Take 20 mg by mouth daily. , Disp: , Rfl:  Allergies: Lisinopril  Review of Systems  Constitutional: Negative for chills, fever and malaise/fatigue.  HENT: Negative for congestion, sinus pain and sore throat.   Eyes: Negative for blurred vision and pain.  Respiratory: Negative for cough and wheezing.   Cardiovascular: Negative for chest pain and leg swelling.  Gastrointestinal: Negative for abdominal pain, constipation, diarrhea, heartburn, nausea and vomiting.  Genitourinary: Negative for dysuria, frequency, hematuria and urgency.  Musculoskeletal: Negative for back pain, joint pain, myalgias and neck pain.  Skin: Negative for itching and rash.  Neurological: Negative for dizziness, tremors and weakness.  Endo/Heme/Allergies: Does not bruise/bleed easily.  Psychiatric/Behavioral: Negative for depression. The patient is not nervous/anxious and does not have insomnia.     Objective: BP 140/80   Ht 5\' 2"  (1.575 m)   Wt 112 lb (50.8 kg)   BMI 20.49 kg/m   Filed Weights   12/18/18 1502  Weight: 112 lb (50.8 kg)   Physical Exam Constitutional:      General: She is not in acute distress.    Appearance: She is well-developed.  HENT:     Head: Normocephalic and atraumatic. No laceration.     Right Ear: Hearing normal.     Left Ear: Hearing normal.     Mouth/Throat:     Pharynx: Uvula midline.  Eyes:     Pupils: Pupils are equal, round, and reactive to light.  Neck:     Musculoskeletal: Normal range of motion and neck supple.     Thyroid: No thyromegaly.  Cardiovascular:     Rate and Rhythm: Normal rate and regular rhythm.     Heart sounds: No murmur. No friction rub. No gallop.   Pulmonary:     Effort: Pulmonary effort is normal. No respiratory distress.     Breath sounds: Normal breath sounds. No wheezing.  Chest:     Breasts:        Right: No mass, skin change or tenderness.        Left: No mass, skin change or tenderness.  Abdominal:     General: Bowel sounds  are normal. There is no distension.     Palpations: Abdomen is soft.     Tenderness: There is no abdominal tenderness. There is no rebound.  Musculoskeletal: Normal range of motion.  Neurological:     Mental Status: She is alert and oriented to person, place, and time.     Cranial Nerves: No cranial nerve deficit.  Skin:    General: Skin is warm and dry.  Psychiatric:        Judgment: Judgment normal.  Vitals signs reviewed.     Assessment: 1. CIN III (cervical intraepithelial neoplasia grade  III) with severe dysplasia   Pros and cons of surgery (LEEP), recovery restrictions discussed Follow up for dysplasia also planned  I have had a careful discussion with this patient about all the options available and the risk/benefits of each. I have fully informed this patient that surgery may subject her to a variety of discomforts and risks: She understands that most patients have surgery with little difficulty, but problems can happen ranging from minor to fatal. These include nausea, vomiting, pain, bleeding, infection, poor healing, hernia, or formation of adhesions. Unexpected reactions may occur from any drug or anesthetic given. Unintended injury may occur to other pelvic or abdominal structures such as Fallopian tubes, ovaries, bladder, ureter (tube from kidney to bladder), or bowel. Nerves going from the pelvis to the legs may be injured. Any such injury may require immediate or later additional surgery to correct the problem. Excessive blood loss requiring transfusion is very unlikely but possible. Dangerous blood clots may form in the legs or lungs. Physical and sexual activity will be restricted in varying degrees for an indeterminate period of time but most often 2-6 weeks.  Finally, she understands that it is impossible to list every possible undesirable effect and that the condition for which surgery is done is not always cured or significantly improved, and in rare cases may be even  worse.Ample time was given to answer all questions.  [name], MD, [facility], [facility] 12/18/2018  3:20 PM

## 2018-12-18 NOTE — Patient Instructions (Signed)
LEEP & Cervical Conization, Care After This sheet gives you information about how to care for yourself after your procedure. Your doctor may also give you more specific instructions. If you have problems or questions, contact your doctor. Follow these instructions at home: Medicines   Take over-the-counter and prescription medicines only as told by your doctor.  Do not take aspirin until your doctor says it is okay.  If you take pain medicine: ? You may have constipation. To help treat this, your doctor may tell you to:  Drink enough fluid to keep your pee (urine) clear or pale yellow.  Take medicines.  Eat foods that are high in fiber. These include fresh fruits and vegetables, whole grains, bran, and beans.  Limit foods that are high in fat and sugar. These include fried foods and sweet foods. ? Do not drive or use heavy machines. General instructions  You can eat your usual diet unless your doctor tells you not to do so.  Take showers for the first week. Do not take baths, swim, or use hot tubs until your doctor says it is okay.  Do not douche, use tampons, or have sex until your doctor says it is okay.  For 7-14 days after your procedure, avoid: ? Being very active. ? Exercising. ? Heavy lifting.  Keep all follow-up visits as told by your doctor. This is important. Contact a doctor if:  You have a rash.  You are dizzy or lightheaded.  You feel sick to your stomach (nauseous).  You throw up (vomit).  You have fluid from your vagina (vaginal discharge) that smells bad. Get help right away if:  There are blood clots coming from your vagina.  You have more bleeding than you would have in a normal period. For example, you soak a pad in less than 1 hour.  You have a fever.  You have more and more cramps.  You pass out (faint).  You have pain when peeing.  Your have a lot of pain.  Your pain gets worse.  Your pain does not get better when you take your  medicine.  You have blood in your pee.  You throw up (vomit). Summary  After your procedure, take over-the-counter and prescription medicines only as told by your doctor.  Do not douche, use tampons, or have sex until your doctor says it is okay.  For about 7-14 days after your procedure, try not to exercise or lift heavy objects.  Get help right away if you have new symptoms, or if your symptoms become worse. This information is not intended to replace advice given to you by your health care provider. Make sure you discuss any questions you have with your health care provider. Document Released: 12/20/2007 Document Revised: 02/22/2017 Document Reviewed: 03/14/2016 Elsevier Patient Education  2020 [employer].

## 2018-12-18 NOTE — H&P (View-Only) (Signed)
PRE-OPERATIVE HISTORY AND PHYSICAL EXAM  HPI:  Marie [name] is a 53 y.o. G3P3003 No LMP recorded. Patient has had an ablation.; she is being admitted for surgery related to cervical dysplasia.  Pthad a recent PAP LGSIL.  This was followed by a biopsy procedure.  Results as follows: Diagnosis  1. 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  2. Cervix, biopsy, 12 o'clock  - LOW GRADE SQUAMOUS INTRAEPITHELIAL LESION, CIN I (MILD DYSPLASIA).  3. Cervix, biopsy, 6 o'clock  - LOW GRADE SQUAMOUS INTRAEPITHELIAL LESION, CIN I (MILD DYSPLASIA).   No prior h/o cervical disease.  Options counseled at length as to surgery, cryo, expectant management; office based surgery vs hospital based surgery; recovery.  PMHx: Past Medical History:  Diagnosis Date  . CHF (congestive heart failure) ([place])   . Chronic pancreatitis ([place])   . Coronary atherosclerosis of unspecified type of vessel, native or graft   . [id number]    "weekly" (07/29/2013)  . Hypertension   . Leiomyoma of uterus, unspecified   . Loss of weight   . Obesity, unspecified   . OSA (obstructive sleep apnea)    no CPAP  . Pure hypercholesterolemia   . Sarcoidosis   . Shortness of breath    none since stent 2 yrs ago  . Type II diabetes mellitus ([place])    Past Surgical History:  Procedure Laterality Date  . CORONARY ANGIOPLASTY WITH STENT PLACEMENT  09/2009   "1"  . CORONARY ARTERY BYPASS GRAFT  ~ 2000   CABG X3  . HYSTEROSCOPY WITH NOVASURE N/A 09/04/2012   Procedure: HYSTEROSCOPY WITH NOVASURE;  Surgeon: Mora Bellman, MD;  Location: [facility];  Service: Gynecology;  Laterality: N/A;  with removal intrauterine device  . ROBOTIC ASSISTED LAPAROSCOPIC CHOLECYSTECTOMY-MULTI SITE N/A 01/14/2018   Procedure: ROBOTIC ASSISTED LAPAROSCOPIC CHOLECYSTECTOMY-MULTI SITE;  Surgeon: Jules Husbands, MD;  Location: [facility];  Service: General;  Laterality: N/A;  .  TUBAL LIGATION  1997   Family History  Problem Relation Age of Onset  . Diabetes Mother   . Coronary artery disease Mother   . Heart disease Mother   . Hyperlipidemia Mother   . Sudden death Mother   . Colon cancer Maternal Grandfather   . Liver disease Maternal Uncle   . Heart disease Maternal Uncle   . Heart disease Paternal Aunt   . Stroke Maternal Grandmother   . Breast cancer Neg Hx    Social History   Tobacco Use  . Smoking status: Current Every Day Smoker    Packs/day: 0.50    Years: 32.00    Pack years: 16.00    Types: Cigarettes  . Smokeless tobacco: Never Used  Substance Use Topics  . Alcohol use: Yes    Alcohol/week: 24.0 standard drinks    Types: 24 Glasses of wine per week    Comment: 08/28/2016 "3-4 glasses of wine qd; last glass was last night",  . Drug use: No    Current Outpatient Medications:  .  clopidogrel (PLAVIX) 75 MG tablet, Take 75 mg by mouth daily., Disp: , Rfl:  .  hydrochlorothiazide (HYDRODIURIL) 25 MG tablet, Take 25 mg by mouth daily. , Disp: , Rfl:  .  HYDROcodone-acetaminophen (NORCO/VICODIN) 5-325 MG tablet, Take 1-2 tablets by mouth every 6 (six) hours as needed for moderate pain., Disp: 30 tablet, Rfl: 0 .  losartan (COZAAR) 50 MG tablet, Take 50 mg by mouth daily., Disp: , Rfl:  .  metFORMIN (GLUCOPHAGE) 500 MG tablet, Take 500 mg by mouth daily with breakfast. , Disp: , Rfl:  .  metoprolol (LOPRESSOR) 100 MG tablet, Take 100 mg by mouth daily. , Disp: , Rfl: 0 .  nitroGLYCERIN (NITROSTAT) 0.4 MG SL tablet, Place 1 tablet (0.4 mg total) under the tongue every 5 (five) minutes as needed for chest pain. Maximum of 3 doses., Disp: 25 tablet, Rfl: 2 .  ondansetron (ZOFRAN) 4 MG tablet, Take 1 tablet (4 mg total) by mouth every 6 (six) hours as needed for nausea., Disp: 20 tablet, Rfl: 0 .  oxybutynin (DITROPAN-XL) 5 MG 24 hr tablet, Take 1 tablet (5 mg total) by mouth at bedtime., Disp: 30 tablet, Rfl: 6 .  pravastatin (PRAVACHOL) 20 MG  tablet, Take 20 mg by mouth daily. , Disp: , Rfl:  Allergies: Lisinopril  Review of Systems  Constitutional: Negative for chills, fever and malaise/fatigue.  HENT: Negative for congestion, sinus pain and sore throat.   Eyes: Negative for blurred vision and pain.  Respiratory: Negative for cough and wheezing.   Cardiovascular: Negative for chest pain and leg swelling.  Gastrointestinal: Negative for abdominal pain, constipation, diarrhea, heartburn, nausea and vomiting.  Genitourinary: Negative for dysuria, frequency, hematuria and urgency.  Musculoskeletal: Negative for back pain, joint pain, myalgias and neck pain.  Skin: Negative for itching and rash.  Neurological: Negative for dizziness, tremors and weakness.  Endo/Heme/Allergies: Does not bruise/bleed easily.  Psychiatric/Behavioral: Negative for depression. The patient is not nervous/anxious and does not have insomnia.     Objective: BP 140/80   Ht 5\' 2"  (1.575 m)   Wt 112 lb (50.8 kg)   BMI 20.49 kg/m   Filed Weights   12/18/18 1502  Weight: 112 lb (50.8 kg)   Physical Exam Constitutional:      General: She is not in acute distress.    Appearance: She is well-developed.  HENT:     Head: Normocephalic and atraumatic. No laceration.     Right Ear: Hearing normal.     Left Ear: Hearing normal.     Mouth/Throat:     Pharynx: Uvula midline.  Eyes:     Pupils: Pupils are equal, round, and reactive to light.  Neck:     Musculoskeletal: Normal range of motion and neck supple.     Thyroid: No thyromegaly.  Cardiovascular:     Rate and Rhythm: Normal rate and regular rhythm.     Heart sounds: No murmur. No friction rub. No gallop.   Pulmonary:     Effort: Pulmonary effort is normal. No respiratory distress.     Breath sounds: Normal breath sounds. No wheezing.  Chest:     Breasts:        Right: No mass, skin change or tenderness.        Left: No mass, skin change or tenderness.  Abdominal:     General: Bowel sounds  are normal. There is no distension.     Palpations: Abdomen is soft.     Tenderness: There is no abdominal tenderness. There is no rebound.  Musculoskeletal: Normal range of motion.  Neurological:     Mental Status: She is alert and oriented to person, place, and time.     Cranial Nerves: No cranial nerve deficit.  Skin:    General: Skin is warm and dry.  Psychiatric:        Judgment: Judgment normal.  Vitals signs reviewed.     Assessment: 1. CIN III (cervical intraepithelial neoplasia grade  III) with severe dysplasia   Pros and cons of surgery (LEEP), recovery restrictions discussed Follow up for dysplasia also planned  I have had a careful discussion with this patient about all the options available and the risk/benefits of each. I have fully informed this patient that surgery may subject her to a variety of discomforts and risks: She understands that most patients have surgery with little difficulty, but problems can happen ranging from minor to fatal. These include nausea, vomiting, pain, bleeding, infection, poor healing, hernia, or formation of adhesions. Unexpected reactions may occur from any drug or anesthetic given. Unintended injury may occur to other pelvic or abdominal structures such as Fallopian tubes, ovaries, bladder, ureter (tube from kidney to bladder), or bowel. Nerves going from the pelvis to the legs may be injured. Any such injury may require immediate or later additional surgery to correct the problem. Excessive blood loss requiring transfusion is very unlikely but possible. Dangerous blood clots may form in the legs or lungs. Physical and sexual activity will be restricted in varying degrees for an indeterminate period of time but most often 2-6 weeks.  Finally, she understands that it is impossible to list every possible undesirable effect and that the condition for which surgery is done is not always cured or significantly improved, and in rare cases may be even  worse.Ample time was given to answer all questions.  [name], MD, [facility], [facility] 12/18/2018  3:20 PM

## 2018-12-19 ENCOUNTER — Other Ambulatory Visit
Admission: RE | Admit: 2018-12-19 | Discharge: 2018-12-19 | Disposition: A | Discharge status: Home / Self Care | Payer: Self-pay | Source: Ambulatory Visit | Attending: Obstetrics & Gynecology | Admitting: Obstetrics & Gynecology

## 2018-12-19 ENCOUNTER — Other Ambulatory Visit: Payer: Self-pay

## 2018-12-19 NOTE — Patient Instructions (Addendum)
Your procedure is scheduled on: Thurs 10/1 Report to Day Surgery. [facility] To find out your arrival time please call [phone] between [place] on Wed. 9/30  Remember: Instructions that are not followed completely may result in serious medical risk,  up to and including death, or upon the discretion of your surgeon and anesthesiologist your  surgery may need to be rescheduled.     _X__ 1. Do not eat food after midnight the night before your procedure.                 No gum chewing or hard candies. You may drink clear liquids up to 2 hours                 before you are scheduled to arrive for your surgery- DO not drink clear                 liquids within 2 hours of the start of your surgery.                 Clear Liquids include:  water, Black Coffee or Tea (Do not add                 anything to coffee or tea).  __X__2.  On the morning of surgery brush your teeth with toothpaste and water, you                may rinse your mouth with mouthwash if you wish.  Do not swallow any toothpaste of mouthwash.     _X__ 3.  No Alcohol for 24 hours before or after surgery.   _X__ 4.  Do Not Smoke or use e-cigarettes For 24 Hours Prior to Your Surgery.                 Do not use any chewable tobacco products for at least 6 hours prior to                 surgery.  ____  5.  Bring all medications with you on the day of surgery if instructed.   __x__  6.  Notify your doctor if there is any change in your medical condition      (cold, fever, infections).     Do not wear jewelry, make-up, hairpins, clips or nail polish. Do not wear lotions, powders, or perfumes. You may wear deodorant. Do not shave 48 hours prior to surgery. Men may shave face and neck. Do not bring valuables to the hospital.    [facility] is not responsible for any belongings or valuables.  Contacts, dentures or bridgework may not be worn into surgery. Leave your suitcase in the car. After  surgery it may be brought to your room. For patients admitted to the hospital, discharge time is determined by your treatment team.   Patients discharged the day of surgery will not be allowed to drive home.   Please read over the following fact sheets that you were given:    x ____ Take these medicines the morning of surgery with A SIP OF WATER:    1. metoprolol (LOPRESSOR) 100 MG tablet  2.   3.   4.  5.  6.  ____ Fleet Enema (as directed)   ____ Use CHG Soap as directed  ____ Use inhalers on the day of surgery  ____ Stop metformin 2 days prior to surgery    ____ Take 1/2 of usual insulin dose the  night before surgery. No insulin the morning          of surgery.   __x__ Stop Plavix as per your Cardiologist's instructions  ____ Stop Anti-inflammatories    ____ Stop supplements until after surgery.    ____ Bring C-Pap to the hospital.

## 2018-12-22 ENCOUNTER — Other Ambulatory Visit: Payer: Self-pay

## 2018-12-22 ENCOUNTER — Other Ambulatory Visit
Admission: RE | Admit: 2018-12-22 | Discharge: 2018-12-22 | Disposition: A | Discharge status: Home / Self Care | Payer: Self-pay | Source: Ambulatory Visit | Attending: Obstetrics & Gynecology | Admitting: Obstetrics & Gynecology

## 2018-12-22 DIAGNOSIS — Z01812 Encounter for preprocedural laboratory examination: Secondary | ICD-10-CM | POA: Insufficient documentation

## 2018-12-22 DIAGNOSIS — Z20828 Contact with and (suspected) exposure to other viral communicable diseases: Secondary | ICD-10-CM | POA: Insufficient documentation

## 2018-12-22 LAB — CBC
HCT: 47.4 % — ABNORMAL HIGH (ref 36.0–46.0)
Hemoglobin: 15.4 g/dL — ABNORMAL HIGH (ref 12.0–15.0)
MCH: 31 pg (ref 26.0–34.0)
MCHC: 32.5 g/dL (ref 30.0–36.0)
MCV: 95.6 fL (ref 80.0–100.0)
Platelets: 192 10*3/uL (ref 150–400)
RBC: 4.96 MIL/uL (ref 3.87–5.11)
RDW: 12 % (ref 11.5–15.5)
WBC: 6 10*3/uL (ref 4.0–10.5)
nRBC: 0 % (ref 0.0–0.2)

## 2018-12-22 LAB — TYPE AND SCREEN
ABO/RH(D): O POS
Antibody Screen: NEGATIVE

## 2018-12-22 LAB — SARS CORONAVIRUS 2 (TAT 6-24 HRS): SARS Coronavirus 2: NEGATIVE

## 2018-12-25 ENCOUNTER — Encounter
Admission: RE | Disposition: A | Discharge status: Home / Self Care | Payer: Self-pay | Source: Home / Self Care | Attending: Obstetrics & Gynecology

## 2018-12-25 ENCOUNTER — Other Ambulatory Visit: Payer: Self-pay

## 2018-12-25 ENCOUNTER — Encounter: Payer: Self-pay | Admitting: Anesthesiology

## 2018-12-25 ENCOUNTER — Ambulatory Visit: Payer: Self-pay | Admitting: Anesthesiology

## 2018-12-25 ENCOUNTER — Ambulatory Visit
Admission: RE | Admit: 2018-12-25 | Discharge: 2018-12-25 | Disposition: A | Discharge status: Home / Self Care | Payer: Self-pay | Attending: Obstetrics & Gynecology | Admitting: Obstetrics & Gynecology

## 2018-12-25 DIAGNOSIS — E119 Type 2 diabetes mellitus without complications: Secondary | ICD-10-CM | POA: Insufficient documentation

## 2018-12-25 DIAGNOSIS — Z7984 Long term (current) use of oral hypoglycemic drugs: Secondary | ICD-10-CM | POA: Insufficient documentation

## 2018-12-25 DIAGNOSIS — G4733 Obstructive sleep apnea (adult) (pediatric): Secondary | ICD-10-CM | POA: Insufficient documentation

## 2018-12-25 DIAGNOSIS — I509 Heart failure, unspecified: Secondary | ICD-10-CM | POA: Insufficient documentation

## 2018-12-25 DIAGNOSIS — D060 Carcinoma in situ of endocervix: Secondary | ICD-10-CM | POA: Insufficient documentation

## 2018-12-25 DIAGNOSIS — Z79899 Other long term (current) drug therapy: Secondary | ICD-10-CM | POA: Insufficient documentation

## 2018-12-25 DIAGNOSIS — D069 Carcinoma in situ of cervix, unspecified: Secondary | ICD-10-CM | POA: Diagnosis present

## 2018-12-25 DIAGNOSIS — F17210 Nicotine dependence, cigarettes, uncomplicated: Secondary | ICD-10-CM | POA: Insufficient documentation

## 2018-12-25 DIAGNOSIS — Z888 Allergy status to other drugs, medicaments and biological substances status: Secondary | ICD-10-CM | POA: Insufficient documentation

## 2018-12-25 DIAGNOSIS — E7800 Pure hypercholesterolemia, unspecified: Secondary | ICD-10-CM | POA: Insufficient documentation

## 2018-12-25 DIAGNOSIS — Z7902 Long term (current) use of antithrombotics/antiplatelets: Secondary | ICD-10-CM | POA: Insufficient documentation

## 2018-12-25 DIAGNOSIS — Z955 Presence of coronary angioplasty implant and graft: Secondary | ICD-10-CM | POA: Insufficient documentation

## 2018-12-25 DIAGNOSIS — I2510 Atherosclerotic heart disease of native coronary artery without angina pectoris: Secondary | ICD-10-CM | POA: Insufficient documentation

## 2018-12-25 DIAGNOSIS — R87612 Low grade squamous intraepithelial lesion on cytologic smear of cervix (LGSIL): Secondary | ICD-10-CM | POA: Insufficient documentation

## 2018-12-25 DIAGNOSIS — I110 Hypertensive heart disease with heart failure: Secondary | ICD-10-CM | POA: Insufficient documentation

## 2018-12-25 DIAGNOSIS — K861 Other chronic pancreatitis: Secondary | ICD-10-CM | POA: Insufficient documentation

## 2018-12-25 DIAGNOSIS — N880 Leukoplakia of cervix uteri: Secondary | ICD-10-CM

## 2018-12-25 HISTORY — PX: LEEP: SHX91

## 2018-12-25 LAB — GLUCOSE, CAPILLARY
Glucose-Capillary: 78 mg/dL (ref 70–99)
Glucose-Capillary: 81 mg/dL (ref 70–99)

## 2018-12-25 LAB — ABO/RH: ABO/RH(D): O POS

## 2018-12-25 SURGERY — LEEP (LOOP ELECTROSURGICAL EXCISION PROCEDURE)
Anesthesia: General

## 2018-12-25 MED ORDER — LIDOCAINE-EPINEPHRINE 1 %-1:100000 IJ SOLN
INTRAMUSCULAR | Status: AC
  Filled 2018-12-25: qty 1

## 2018-12-25 MED ORDER — IODINE STRONG (LUGOLS) 5 % PO SOLN
ORAL | Status: DC | PRN
  Administered 2018-12-25: 0.2 mL

## 2018-12-25 MED ORDER — HYDROMORPHONE HCL 1 MG/ML IJ SOLN
INTRAMUSCULAR | Status: AC
  Administered 2018-12-25: 0.5 mg via INTRAVENOUS
  Filled 2018-12-25: qty 1

## 2018-12-25 MED ORDER — ACETAMINOPHEN 650 MG RE SUPP
650.0000 mg | RECTAL | Status: DC | PRN
  Filled 2018-12-25: qty 1

## 2018-12-25 MED ORDER — FAMOTIDINE 20 MG PO TABS
20.0000 mg | ORAL_TABLET | Freq: Once | ORAL | Status: AC
  Administered 2018-12-25: 20 mg via ORAL

## 2018-12-25 MED ORDER — ONDANSETRON HCL 4 MG/2ML IJ SOLN
INTRAMUSCULAR | Status: DC | PRN
  Administered 2018-12-25: 4 mg via INTRAVENOUS

## 2018-12-25 MED ORDER — HYDROMORPHONE HCL 1 MG/ML IJ SOLN
0.5000 mg | INTRAMUSCULAR | Status: AC | PRN
  Administered 2018-12-25 (×4): 0.5 mg via INTRAVENOUS

## 2018-12-25 MED ORDER — OXYCODONE-ACETAMINOPHEN 5-325 MG PO TABS: 1.0000 | ORAL_TABLET | ORAL | Status: DC | PRN

## 2018-12-25 MED ORDER — DEXAMETHASONE SODIUM PHOSPHATE 10 MG/ML IJ SOLN
INTRAMUSCULAR | Status: DC | PRN
  Administered 2018-12-25: 10 mg via INTRAVENOUS

## 2018-12-25 MED ORDER — GLYCOPYRROLATE 0.2 MG/ML IJ SOLN
INTRAMUSCULAR | Status: DC | PRN
  Administered 2018-12-25: 0.2 mg via INTRAVENOUS

## 2018-12-25 MED ORDER — FENTANYL CITRATE (PF) 100 MCG/2ML IJ SOLN
INTRAMUSCULAR | Status: DC | PRN
  Administered 2018-12-25 (×2): 25 ug via INTRAVENOUS
  Administered 2018-12-25: 50 ug via INTRAVENOUS

## 2018-12-25 MED ORDER — MORPHINE SULFATE (PF) 4 MG/ML IV SOLN: 1.0000 mg | INTRAVENOUS | Status: DC | PRN

## 2018-12-25 MED ORDER — OXYCODONE-ACETAMINOPHEN 5-325 MG PO TABS: 1.0000 | ORAL_TABLET | ORAL | 0 refills | Status: DC | PRN

## 2018-12-25 MED ORDER — PROPOFOL 10 MG/ML IV BOLUS
INTRAVENOUS | Status: DC | PRN
  Administered 2018-12-25: 100 mg via INTRAVENOUS

## 2018-12-25 MED ORDER — LIDOCAINE HCL (CARDIAC) PF 100 MG/5ML IV SOSY
PREFILLED_SYRINGE | INTRAVENOUS | Status: DC | PRN
  Administered 2018-12-25: 100 mg via INTRATRACHEAL

## 2018-12-25 MED ORDER — FENTANYL CITRATE (PF) 100 MCG/2ML IJ SOLN
INTRAMUSCULAR | Status: AC
  Filled 2018-12-25: qty 2

## 2018-12-25 MED ORDER — FAMOTIDINE 20 MG PO TABS
ORAL_TABLET | ORAL | Status: AC
  Filled 2018-12-25: qty 1

## 2018-12-25 MED ORDER — KETOROLAC TROMETHAMINE 30 MG/ML IJ SOLN
30.0000 mg | Freq: Four times a day (QID) | INTRAMUSCULAR | Status: DC
  Administered 2018-12-25: 30 mg via INTRAVENOUS

## 2018-12-25 MED ORDER — KETOROLAC TROMETHAMINE 30 MG/ML IJ SOLN
INTRAMUSCULAR | Status: AC
  Filled 2018-12-25: qty 1

## 2018-12-25 MED ORDER — MIDAZOLAM HCL 2 MG/2ML IJ SOLN
INTRAMUSCULAR | Status: AC
  Filled 2018-12-25: qty 2

## 2018-12-25 MED ORDER — LACTATED RINGERS IV SOLN: INTRAVENOUS | Status: DC

## 2018-12-25 MED ORDER — ACETAMINOPHEN 325 MG PO TABS: 650.0000 mg | ORAL_TABLET | ORAL | Status: DC | PRN

## 2018-12-25 MED ORDER — FENTANYL CITRATE (PF) 100 MCG/2ML IJ SOLN
25.0000 ug | INTRAMUSCULAR | Status: AC | PRN
  Administered 2018-12-25 (×6): 25 ug via INTRAVENOUS

## 2018-12-25 MED ORDER — FENTANYL CITRATE (PF) 100 MCG/2ML IJ SOLN
INTRAMUSCULAR | Status: AC
  Administered 2018-12-25: 25 ug via INTRAVENOUS
  Filled 2018-12-25: qty 2

## 2018-12-25 MED ORDER — ONDANSETRON HCL 4 MG/2ML IJ SOLN: 4.0000 mg | Freq: Once | INTRAMUSCULAR | Status: DC | PRN

## 2018-12-25 MED ORDER — SODIUM CHLORIDE 0.9 % IV SOLN
INTRAVENOUS | Status: DC
  Administered 2018-12-25: 12:00:00 via INTRAVENOUS

## 2018-12-25 MED ORDER — FERRIC SUBSULFATE 259 MG/GM EX SOLN
CUTANEOUS | Status: AC
  Filled 2018-12-25: qty 8

## 2018-12-25 MED ORDER — ACETAMINOPHEN 500 MG PO TABS
1000.0000 mg | ORAL_TABLET | Freq: Once | ORAL | Status: AC
  Administered 2018-12-25: 13:00:00 1000 mg via ORAL
  Filled 2018-12-25: qty 2

## 2018-12-25 MED ORDER — ACETAMINOPHEN 500 MG PO TABS
ORAL_TABLET | ORAL | Status: AC
  Administered 2018-12-25: 1000 mg via ORAL
  Filled 2018-12-25: qty 2

## 2018-12-25 MED ORDER — LIDOCAINE-EPINEPHRINE 1 %-1:100000 IJ SOLN
INTRAMUSCULAR | Status: DC | PRN
  Administered 2018-12-25: 10 mL

## 2018-12-25 MED ORDER — IODINE STRONG (LUGOLS) 5 % PO SOLN
ORAL | Status: AC
  Filled 2018-12-25: qty 1

## 2018-12-25 SURGICAL SUPPLY — 31 items
APPLICATOR COTTON TIP 6IN STRL (MISCELLANEOUS) ×4 IMPLANT
BAG COUNTER SPONGE EZ (MISCELLANEOUS) ×2 IMPLANT
CANISTER SUCT 1200ML W/VALVE (MISCELLANEOUS) ×2 IMPLANT
CATH ROBINSON RED A/P 16FR (CATHETERS) ×2 IMPLANT
CNTNR SPEC 2.5X3XGRAD LEK (MISCELLANEOUS)
CONT SPEC 4OZ STER OR WHT (MISCELLANEOUS)
CONTAINER SPEC 2.5X3XGRAD LEK (MISCELLANEOUS) IMPLANT
COVER WAND RF STERILE (DRAPES) ×2 IMPLANT
ELECT LEEP BALL 5MM 12CM (MISCELLANEOUS) ×2
ELECT LEEP LOOP 20X10 R2010 (MISCELLANEOUS) ×2
ELECT LOOP 1.0X1.0CM R1010 (MISCELLANEOUS) ×2
ELECT REM PT RETURN 9FT ADLT (ELECTROSURGICAL) ×2
ELECTRODE LEEP BALL 5MM 12CM (MISCELLANEOUS) ×1 IMPLANT
ELECTRODE LEP LOOP 20X10 R2010 (MISCELLANEOUS) ×1 IMPLANT
ELECTRODE LOOP 1.0X1.0CM R1010 (MISCELLANEOUS) ×1 IMPLANT
ELECTRODE REM PT RTRN 9FT ADLT (ELECTROSURGICAL) ×1 IMPLANT
GLOVE BIO SURGEON STRL SZ8 (GLOVE) ×2 IMPLANT
GOWN STRL REUS W/ TWL LRG LVL3 (GOWN DISPOSABLE) ×1 IMPLANT
GOWN STRL REUS W/ TWL XL LVL3 (GOWN DISPOSABLE) ×1 IMPLANT
GOWN STRL REUS W/TWL LRG LVL3 (GOWN DISPOSABLE) ×1
GOWN STRL REUS W/TWL XL LVL3 (GOWN DISPOSABLE) ×1
NEEDLE SPNL 22GX3.5 QUINCKE BK (NEEDLE) ×2 IMPLANT
NS IRRIG 500ML POUR BTL (IV SOLUTION) ×2 IMPLANT
PACK DNC HYST (MISCELLANEOUS) ×2 IMPLANT
PAD OB MATERNITY 4.3X12.25 (PERSONAL CARE ITEMS) ×2 IMPLANT
PAD PREP 24X41 OB/GYN DISP (PERSONAL CARE ITEMS) ×2 IMPLANT
PENCIL ELECTRO HAND CTR (MISCELLANEOUS) ×2 IMPLANT
STRAP SAFETY 5IN WIDE (MISCELLANEOUS) ×2 IMPLANT
STRAW SMOKE EVAC LEEP 6150 NON (MISCELLANEOUS) ×2 IMPLANT
SYR CONTROL 10ML LL (SYRINGE) ×2 IMPLANT
TUBING CONNECTING 10 (TUBING) ×2 IMPLANT

## 2018-12-25 NOTE — Anesthesia Post-op Follow-up Note (Signed)
Anesthesia QCDR form completed.        

## 2018-12-25 NOTE — Discharge Instructions (Signed)
Outpatient Surgery, Adult, Care After These instructions provide you with information about caring for yourself after your procedure. Your health care provider may also give you more specific instructions. Your treatment has been planned according to current medical practices, but problems sometimes occur. Call your health care provider if you have any problems or questions after your procedure. What can I expect after the procedure? After the procedure, it is common to have:  Tenderness and numbness at the surgical site.  Swelling and bruising around the surgical site.  Nausea. Follow these instructions at home:     For at least 24 hours after the procedure:  Have a responsible adult stay with you. It is important to have someone help care for you until you are awake and alert.  Rest as needed.  Do not: ? Participate in activities in which you could fall or become injured. ? Drive. ? Use heavy machinery. ? Drink alcohol. ? Take sleeping pills or medicines that cause drowsiness. ? Make important decisions or sign legal documents. ? Take care of children on your own. Activity  Return to your normal activities as told by your health care provider. Ask your health care provider what activities are safe for you.  Do not lift anything that is heavier than 10 lb (4.5 kg), or the limit that your health care provider tells you, until your health care provider says it is okay.  Do not play contact sports until your health care provider says it is okay. Incision care  Follow instructions from your health care provider about how to take care of an incision, if you have one. Make sure you: ? Wash your hands with soap and water before you change your bandage (dressing). If soap and water are not available, use hand sanitizer. ? Change your dressing as told by your health care provider. ? Leave stitches (sutures), skin glue, or adhesive strips in place. These skin closures may need to stay  in place for 2 weeks or longer. If adhesive strip edges start to loosen and curl up, you may trim the loose edges. Do not remove adhesive strips completely unless your health care provider tells you to do that.  Check your incision area every day for signs of infection. Check for: ? More redness, swelling, or pain. ? More fluid or blood. ? Warmth. ? Pus or a bad smell. Medicines  Take over-the-counter and prescription medicines only as told by your health care provider.  Do not drive or use heavy machinery while taking prescription pain medicines. Eating and drinking  Follow the diet recommended by your health care provider.  When you are hungry, begin eating light and bland foods such as toast. Gradually return to your regular diet.  If you vomit: ? Drink water, juice, or soup when you can drink without vomiting. ? Make sure you have little or no nausea before eating solid foods. General instructions  If you have sleep apnea, surgery and certain medicines can increase your risk for breathing problems. Follow instructions from your HCP about wearing your sleep device: ? Anytime you are sleeping, including during daytime naps. ? While taking prescription pain medicines, sleeping medicines, or medicines that make you drowsy.  Do not use any tobacco products, such as cigarettes, chewing tobacco, and e-cigarettes, for as long as possible.  If you smoke, do not smoke without supervision.  Keep all follow-up visits as told by your health care provider. This is important. Contact a health care provider if:  You  have more redness, swelling, or pain around your incision.  You have more fluid or blood coming from your incision.  Your incision feels warm to the touch.  You have pus or a bad smell coming from your incision.  You have a fever.  You feel light-headed or you faint.  You develop a rash.  You keep feeling nauseous or keep vomiting.  You have very bad pain, even after  taking the medicines your health care provider has prescribed or recommended.  You have constipation. Get help right away if:  You are unable to pass urine.  You have trouble breathing. Summary  Have a responsible adult stay with you for at least 24 hours after the procedure.  Nausea is common after a procedure. Make sure you have little or no nausea before eating solid foods. Follow the diet recommended by your health care provider.  Ask your health care provider what activities are safe for you. This information is not intended to replace advice given to you by your health care provider. Make sure you discuss any questions you have with your health care provider. Document Released: 07/03/2015 Document Revised: 06/10/2017 Document Reviewed: 07/03/2015 Elsevier Patient Education  2020 [place]   1) The drugs that you were given will stay in your system until tomorrow so for the next 24 hours you should not:  A) Drive an automobile B) Make any legal decisions C) Drink any alcoholic beverage   2) You may resume regular meals tomorrow.  Today it is better to start with liquids and gradually work up to solid foods.  You may eat anything you prefer, but it is better to start with liquids, then soup and crackers, and gradually work up to solid foods.   3) Please notify your doctor immediately if you have any unusual bleeding, trouble breathing, redness and pain at the surgery site, drainage, fever, or pain not relieved by medication.    4) Additional Instructions:        Please contact your physician with any problems or Same Day Surgery at [phone], Monday through Friday 6 am to 4 pm, or [place] at [facility] number at [phone].

## 2018-12-25 NOTE — Anesthesia Preprocedure Evaluation (Addendum)
Anesthesia Evaluation  Patient identified by MRN, date of birth, ID band Patient awake    Reviewed: Allergy & Precautions, NPO status , Patient's Chart, lab work & pertinent test results, reviewed documented beta blocker date and time   Airway Mallampati: III  TM Distance: >3 FB     Dental  (+) Upper Dentures, Lower Dentures   Pulmonary shortness of breath, sleep apnea , COPD, Current Smoker and Patient abstained from smoking.,           Cardiovascular hypertension, Pt. on medications and Pt. on home beta blockers + CAD, + Cardiac Stents, + CABG and +CHF       Neuro/Psych  Headaches, PSYCHIATRIC DISORDERS    GI/Hepatic GERD  Controlled,  Endo/Other  diabetes, Type 2  Renal/GU      Musculoskeletal   Abdominal   Peds  Hematology   Anesthesia Other Findings Sarcoid. Smokes. ETOH. EKG ok from 1 yr ago.  Reproductive/Obstetrics                            Anesthesia Physical Anesthesia Plan  ASA: III  Anesthesia Plan: General   Post-op Pain Management:    Induction: Intravenous  PONV Risk Score and Plan:   Airway Management Planned: Oral ETT and LMA  Additional Equipment:   Intra-op Plan:   Post-operative Plan:   Informed Consent: I have reviewed the patients History and Physical, chart, labs and discussed the procedure including the risks, benefits and alternatives for the proposed anesthesia with the patient or authorized representative who has indicated his/her understanding and acceptance.       Plan Discussed with: CRNA  Anesthesia Plan Comments:         Anesthesia Quick Evaluation

## 2018-12-25 NOTE — Progress Notes (Signed)
Pt states pain 8/10 after 15mcg Fentanyl and 30mg  Toradol. Dr. Marcello Moores notified. Acknowledged. Orders received.

## 2018-12-25 NOTE — Interval H&P Note (Signed)
History and Physical Interval Note:  12/25/2018 10:46 AM  Marie [name] Rutha Bouchard  has presented today for surgery, with the diagnosis of Severe cervical dysplasia.  The various methods of treatment have been discussed with the patient and family. After consideration of risks, benefits and other options for treatment, the patient has consented to  Procedure(s): LOOP ELECTROSURGICAL EXCISION PROCEDURE (LEEP) (N/A) as a surgical intervention.  The patient's history has been reviewed, patient examined, no change in status, stable for surgery.  I have reviewed the patient's chart and labs.  Questions were answered to the patient's satisfaction.     Hoyt Koch

## 2018-12-25 NOTE — Anesthesia Procedure Notes (Signed)
Procedure Name: LMA Insertion Date/Time: 12/25/2018 11:44 AM Performed by: Jerrye Noble, CRNA Pre-anesthesia Checklist: Patient identified, Emergency Drugs available, Suction available, Patient being monitored and Timeout performed Patient Re-evaluated:Patient Re-evaluated prior to induction Oxygen Delivery Method: Circle system utilized Preoxygenation: Pre-oxygenation with 100% oxygen Induction Type: IV induction Ventilation: Mask ventilation without difficulty LMA: LMA inserted LMA Size: 4.0 Number of attempts: 1

## 2018-12-25 NOTE — Transfer of Care (Signed)
Immediate Anesthesia Transfer of Care Note  Patient: Marie [name]  Procedure(s) Performed: LOOP ELECTROSURGICAL EXCISION PROCEDURE (LEEP) (N/A )  Patient Location: PACU  Anesthesia Type:General  Level of Consciousness: awake and alert   Airway & Oxygen Therapy: Patient Spontanous Breathing and Patient connected to face mask oxygen  Post-op Assessment: Report given to RN and Post -op Vital signs reviewed and stable  Post vital signs: Reviewed and stable  Last Vitals:  Vitals Value Taken Time  BP 147/94 12/25/18 1223  Temp 36.1 C 12/25/18 1223  Pulse 72 12/25/18 1224  Resp 21 12/25/18 1224  SpO2 100 % 12/25/18 1224  Vitals shown include unvalidated device data.  Last Pain:  Vitals:   12/25/18 1041  TempSrc: Temporal  PainSc: 0-No pain         Complications: No apparent anesthesia complications

## 2018-12-25 NOTE — Op Note (Signed)
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 Cervix grossly showed no lesions.    PROCEDURE IN DETAIL: After informed consent was obtained, the patient was taken to the operating room where general anesthesia was administered without difficulty. The patient was positioned in the dorsal lithotomy position in [place]. Time-out was performed. The patient was examined under anesthesia, and the above findings were noted. She was prepped and draped in normal sterile fashion. The patient's bladder was cathaterized with an in and out catheter.  A weighted speculum and deaver were placed inside the patient's vagina. The above mentioned findings were noted. A sound was then used to trace the canal and it's width.  Using the LEEP device and loops for ecto- and endo- cervical specimens,  the conization was performed, making sure to encompass the entire lesion and transformation zone and to take a deeper second specimen. The cone bed was then cauterized to achieve hemostasis. Speculum was removed and patient awoken from anesthesia.  The patient tolerated the procedure well. Sponge, lap, needle, and instrument counts were correct x 2. The patient was transferred to the recovery room awake, alert and breathing independently in stable condition.  [name], MD, [facility], [facility] 12/25/2018  12:09 PM

## 2018-12-25 NOTE — Anesthesia Postprocedure Evaluation (Signed)
Anesthesia Post Note  Patient: Marie [name]  Procedure(s) Performed: LOOP ELECTROSURGICAL EXCISION PROCEDURE (LEEP) (N/A )  Patient location during evaluation: PACU Anesthesia Type: General Level of consciousness: awake and alert Pain management: pain level controlled Vital Signs Assessment: post-procedure vital signs reviewed and stable Respiratory status: spontaneous breathing, nonlabored ventilation, respiratory function stable and patient connected to nasal cannula oxygen Cardiovascular status: blood pressure returned to baseline and stable Postop Assessment: no apparent nausea or vomiting Anesthetic complications: no     Last Vitals:  Vitals:   12/25/18 1413 12/25/18 1426  BP: 114/76 125/73  Pulse: 65 62  Resp: (!) 26 18  Temp: (!) 36.2 C 36.4 C  SpO2: 93% 96%    Last Pain:  Vitals:   12/25/18 1426  TempSrc: Temporal  PainSc: [place]

## 2018-12-25 NOTE — Progress Notes (Signed)
I spoke with Dr. Kenton Kingfisher and Dr. Marcello Moores about pt stating pain 7/10, pt resting quietly. Pt has been given 130mcg Fentanyl, 1,000mg  Tylenol, 30mg  Toradol, 2mg  Dilaudid. Both acknowledged. No new orders at this time.

## 2018-12-26 ENCOUNTER — Encounter: Payer: Self-pay | Admitting: Obstetrics & Gynecology

## 2018-12-31 LAB — SURGICAL PATHOLOGY

## 2019-01-07 ENCOUNTER — Ambulatory Visit: Payer: Self-pay | Admitting: Obstetrics & Gynecology

## 2019-01-15 ENCOUNTER — Other Ambulatory Visit: Payer: Self-pay

## 2019-01-15 ENCOUNTER — Ambulatory Visit (INDEPENDENT_AMBULATORY_CARE_PROVIDER_SITE_OTHER): Payer: Self-pay | Admitting: Obstetrics & Gynecology

## 2019-01-15 ENCOUNTER — Encounter: Payer: Self-pay | Admitting: Obstetrics & Gynecology

## 2019-01-15 VITALS — BP 120/80 | Ht 62.0 in | Wt 109.0 lb

## 2019-01-15 DIAGNOSIS — Z87410 Personal history of cervical dysplasia: Secondary | ICD-10-CM | POA: Insufficient documentation

## 2019-01-15 NOTE — Progress Notes (Signed)
  Postoperative Follow-up Patient presents post op from LEEP for CIN 2-3 on one cervical biopsy (and CIN I on others), 3 weeks ago. Path Clinical History: CIN 3 after LGSIL PAP; primarily endocervical   DIAGNOSIS:  A. ENDOCERVIX; LEEP #1:  - NEGATIVE FOR INTRAEPITHELIAL LESION AND MALIGNANCY.   B. ENDOCERVIX; LEEP #2:  - NEGATIVE FOR INTRAEPITHELIAL LESION AND MALIGNANCY.   C. ECTOCERVIX; LEEP:  - NEGATIVE FOR INTRAEPITHELIAL LESION AND MALIGNANCY.  - FOCAL HYPERKERATOSIS.   D. ENDOCERVIX; CURETTAGE:  - SCANT FRAGMENTS OF BENIGN ENDOCERVICAL EPITHELIUM AND STROMA.  - NEGATIVE FOR INTRAEPITHELIAL LESION AND MALIGNANCY.   Subjective: Patient reports marked improvement in her preop symptoms. Eating a regular diet without difficulty. The patient is not having any pain.  Activity: normal activities of daily living. Patient reports additional symptom's since surgery of No bleeding.  Objective: BP 120/80   Ht 5\' 2"  (1.575 m)   Wt 109 lb (49.4 kg)   BMI 19.94 kg/m  Physical Exam Constitutional:      General: She is not in acute distress.    Appearance: She is well-developed.  Genitourinary:     Pelvic exam was performed with patient supine.     Vagina, uterus and rectum normal.     No vaginal erythema or bleeding.     No cervical motion tenderness, discharge or lesion.     Uterus is not enlarged or tender.     No uterine mass detected.    Uterus is midaxial.     No right or left adnexal mass present.     Right adnexa not tender.     Left adnexa not tender.  Cardiovascular:     Rate and Rhythm: Normal rate.  Pulmonary:     Effort: Pulmonary effort is normal.  Abdominal:     General: There is no distension.     Palpations: Abdomen is soft.     Tenderness: There is no abdominal tenderness.  Musculoskeletal: Normal range of motion.  Neurological:     Mental Status: She is alert and oriented to person, place, and time.     Cranial Nerves: No cranial nerve deficit.  Skin:   General: Skin is warm and dry.     Assessment: s/p :  LEEP progressing well  Plan: Patient has done well after surgery with no apparent complications.  I have discussed the post-operative course to date, and the expected progress moving forward.  The patient understands what complications to be concerned about.  I will see the patient in routine follow up, or sooner if needed.    Activity plan: No restriction.  PAP 3 mos  Hoyt Koch 01/15/2019, 11:57 AM

## 2019-02-23 ENCOUNTER — Other Ambulatory Visit: Payer: Self-pay

## 2019-03-30 ENCOUNTER — Ambulatory Visit: Payer: Medicaid Other | Admitting: Obstetrics & Gynecology

## 2019-05-28 ENCOUNTER — Other Ambulatory Visit: Payer: Self-pay | Admitting: Family Medicine

## 2019-05-28 DIAGNOSIS — R109 Unspecified abdominal pain: Secondary | ICD-10-CM

## 2019-06-09 ENCOUNTER — Ambulatory Visit
Admission: RE | Admit: 2019-06-09 | Discharge: 2019-06-09 | Disposition: A | Discharge status: Home / Self Care | Payer: 59 | Source: Ambulatory Visit | Attending: Family Medicine | Admitting: Family Medicine

## 2019-06-09 DIAGNOSIS — R109 Unspecified abdominal pain: Secondary | ICD-10-CM

## 2019-06-12 ENCOUNTER — Emergency Department (HOSPITAL_COMMUNITY)
Admission: EM | Admit: 2019-06-12 | Discharge: 2019-06-13 | Disposition: A | Discharge status: Home / Self Care | Payer: 59 | Attending: Emergency Medicine | Admitting: Emergency Medicine

## 2019-06-12 DIAGNOSIS — F17210 Nicotine dependence, cigarettes, uncomplicated: Secondary | ICD-10-CM | POA: Insufficient documentation

## 2019-06-12 DIAGNOSIS — I509 Heart failure, unspecified: Secondary | ICD-10-CM | POA: Diagnosis not present

## 2019-06-12 DIAGNOSIS — I110 Hypertensive heart disease with heart failure: Secondary | ICD-10-CM | POA: Insufficient documentation

## 2019-06-12 DIAGNOSIS — Z7984 Long term (current) use of oral hypoglycemic drugs: Secondary | ICD-10-CM | POA: Diagnosis not present

## 2019-06-12 DIAGNOSIS — E119 Type 2 diabetes mellitus without complications: Secondary | ICD-10-CM | POA: Insufficient documentation

## 2019-06-12 DIAGNOSIS — Z79899 Other long term (current) drug therapy: Secondary | ICD-10-CM | POA: Insufficient documentation

## 2019-06-12 DIAGNOSIS — Z7902 Long term (current) use of antithrombotics/antiplatelets: Secondary | ICD-10-CM | POA: Insufficient documentation

## 2019-06-12 DIAGNOSIS — I2510 Atherosclerotic heart disease of native coronary artery without angina pectoris: Secondary | ICD-10-CM | POA: Insufficient documentation

## 2019-06-12 DIAGNOSIS — R1030 Lower abdominal pain, unspecified: Secondary | ICD-10-CM | POA: Diagnosis present

## 2019-06-12 DIAGNOSIS — K529 Noninfective gastroenteritis and colitis, unspecified: Secondary | ICD-10-CM | POA: Diagnosis not present

## 2019-06-12 DIAGNOSIS — Z951 Presence of aortocoronary bypass graft: Secondary | ICD-10-CM | POA: Diagnosis not present

## 2019-06-12 DIAGNOSIS — J449 Chronic obstructive pulmonary disease, unspecified: Secondary | ICD-10-CM | POA: Insufficient documentation

## 2019-06-12 NOTE — ED Triage Notes (Signed)
Pt arrives via [facility] for CC of Abdominal pain that radiates to back. Pain has been increasing over the past 3 weeks. Reports onset of urinary incontinence that started today. Painful stream and dark urine reported as well. 4Mg  of zofran administered en route. 20G PIV estbalished. CBG 124   BP: 156/94, HR 81, o2 94% RA, afebrile.

## 2019-06-13 ENCOUNTER — Other Ambulatory Visit: Payer: Self-pay

## 2019-06-13 ENCOUNTER — Encounter (HOSPITAL_COMMUNITY): Payer: Self-pay

## 2019-06-13 ENCOUNTER — Emergency Department (HOSPITAL_COMMUNITY): Payer: 59

## 2019-06-13 LAB — URINALYSIS, ROUTINE W REFLEX MICROSCOPIC
Bilirubin Urine: NEGATIVE
Glucose, UA: NEGATIVE mg/dL
Hgb urine dipstick: NEGATIVE
Ketones, ur: NEGATIVE mg/dL
Leukocytes,Ua: NEGATIVE
Nitrite: NEGATIVE
Protein, ur: NEGATIVE mg/dL
Specific Gravity, Urine: 1.003 — ABNORMAL LOW (ref 1.005–1.030)
pH: 5 (ref 5.0–8.0)

## 2019-06-13 LAB — LIPASE, BLOOD: Lipase: 20 U/L (ref 11–51)

## 2019-06-13 LAB — CBC WITH DIFFERENTIAL/PLATELET
Abs Immature Granulocytes: 0.01 10*3/uL (ref 0.00–0.07)
Basophils Absolute: 0 10*3/uL (ref 0.0–0.1)
Basophils Relative: 0 %
Eosinophils Absolute: 0 10*3/uL (ref 0.0–0.5)
Eosinophils Relative: 0 %
HCT: 43 % (ref 36.0–46.0)
Hemoglobin: 14 g/dL (ref 12.0–15.0)
Immature Granulocytes: 0 %
Lymphocytes Relative: 40 %
Lymphs Abs: 2.1 10*3/uL (ref 0.7–4.0)
MCH: 31.6 pg (ref 26.0–34.0)
MCHC: 32.6 g/dL (ref 30.0–36.0)
MCV: 97.1 fL (ref 80.0–100.0)
Monocytes Absolute: 0.6 10*3/uL (ref 0.1–1.0)
Monocytes Relative: 12 %
Neutro Abs: 2.5 10*3/uL (ref 1.7–7.7)
Neutrophils Relative %: 48 %
Platelets: 201 10*3/uL (ref 150–400)
RBC: 4.43 MIL/uL (ref 3.87–5.11)
RDW: 11.9 % (ref 11.5–15.5)
WBC: 5.2 10*3/uL (ref 4.0–10.5)
nRBC: 0 % (ref 0.0–0.2)

## 2019-06-13 LAB — I-STAT CHEM 8, ED
BUN: 16 mg/dL (ref 6–20)
Calcium, Ion: 1.07 mmol/L — ABNORMAL LOW (ref 1.15–1.40)
Chloride: 103 mmol/L (ref 98–111)
Creatinine, Ser: 1 mg/dL (ref 0.44–1.00)
Glucose, Bld: 101 mg/dL — ABNORMAL HIGH (ref 70–99)
HCT: 45 % (ref 36.0–46.0)
Hemoglobin: 15.3 g/dL — ABNORMAL HIGH (ref 12.0–15.0)
Potassium: 5.5 mmol/L — ABNORMAL HIGH (ref 3.5–5.1)
Sodium: 138 mmol/L (ref 135–145)
TCO2: 27 mmol/L (ref 22–32)

## 2019-06-13 LAB — I-STAT BETA HCG BLOOD, ED (MC, WL, AP ONLY): I-stat hCG, quantitative: <5 m[IU]/mL (ref <5)

## 2019-06-13 LAB — POTASSIUM: Potassium: 3.9 mmol/L (ref 3.5–5.1)

## 2019-06-13 MED ORDER — KETOROLAC TROMETHAMINE 30 MG/ML IJ SOLN
30.0000 mg | Freq: Once | INTRAMUSCULAR | Status: AC
  Administered 2019-06-13: 30 mg via INTRAVENOUS
  Filled 2019-06-13: qty 1

## 2019-06-13 MED ORDER — DICYCLOMINE HCL 20 MG PO TABS: 20.0000 mg | ORAL_TABLET | Freq: Two times a day (BID) | ORAL | 0 refills | Status: DC

## 2019-06-13 MED ORDER — DICYCLOMINE HCL 10 MG/ML IM SOLN
20.0000 mg | Freq: Once | INTRAMUSCULAR | Status: AC
  Administered 2019-06-13: 04:00:00 20 mg via INTRAMUSCULAR
  Filled 2019-06-13: qty 2

## 2019-06-13 MED ORDER — ALUM & MAG HYDROXIDE-SIMETH 200-200-20 MG/5ML PO SUSP
30.0000 mL | Freq: Once | ORAL | Status: AC
  Administered 2019-06-13: 04:00:00 30 mL via ORAL
  Filled 2019-06-13: qty 30

## 2019-06-13 MED ORDER — LIDOCAINE VISCOUS HCL 2 % MT SOLN
15.0000 mL | Freq: Once | OROMUCOSAL | Status: AC
  Administered 2019-06-13: 04:00:00 15 mL via ORAL
  Filled 2019-06-13: qty 15

## 2019-06-13 MED ORDER — PHENAZOPYRIDINE HCL 100 MG PO TABS
200.0000 mg | ORAL_TABLET | ORAL | Status: AC
  Administered 2019-06-13: 200 mg via ORAL
  Filled 2019-06-13: qty 2

## 2019-06-13 NOTE — ED Notes (Signed)
Patient verbalizes understanding of discharge instructions. Opportunity for questioning and answers were provided. Armband removed by staff, pt discharged from ED ambulatory.   

## 2019-06-13 NOTE — ED Provider Notes (Signed)
[facility] EMERGENCY DEPARTMENT Provider Note   CSN: [id number] Arrival date & time: 06/12/19  2344     History Chief Complaint  Patient presents with  . Abdominal Pain  . Back Pain    Marie [name] is a 54 y.o. female.  The history is provided by the patient.  Abdominal Pain Pain location:  Suprapubic Pain quality: not cramping   Pain radiates to:  Does not radiate Pain severity:  Severe Onset quality:  Gradual Duration:  3 weeks Timing:  Constant Progression:  Worsening Chronicity:  New Context: not alcohol use   Relieved by:  Nothing Worsened by:  Nothing Ineffective treatments:  None tried Associated symptoms: dysuria   Associated symptoms: no anorexia, no belching, no chest pain, no chills, no constipation, no cough, no diarrhea, no fatigue, no fever, no flatus, no hematemesis, no hematochezia, no hematuria, no melena, no nausea, no shortness of breath, no sore throat, no vaginal bleeding, no vaginal discharge and no vomiting   Risk factors: alcohol abuse   Risk factors: not elderly        Past Medical History:  Diagnosis Date  . CHF (congestive heart failure) ([facility])   . Chronic pancreatitis ([place])   . Coronary atherosclerosis of unspecified type of vessel, native or graft   . [id number])    "weekly" (07/29/2013)  . Hypertension   . Leiomyoma of uterus, unspecified   . Loss of weight   . Obesity, unspecified   . OSA (obstructive sleep apnea)    no CPAP  . Pure hypercholesterolemia   . Sarcoidosis   . Shortness of breath    none since stent 2 yrs ago  . Type II diabetes mellitus [facility])     Patient Active Problem List   Diagnosis Date Noted  . Personal history of cervical dysplasia 01/15/2019  . CIN III (cervical intraepithelial neoplasia grade III) with severe dysplasia 10/23/2018  . Atypical chest pain 09/11/2018  . Shortness of breath 09/11/2018  . CAD in native artery 09/11/2018  . Hyperlipidemia LDL goal <70 09/11/2018    . Acute on chronic pancreatitis ([place]) 01/20/2018  . Calculus of gallbladder without cholecystitis without obstruction   . Malnutrition of moderate degree 08/30/2016  . SBO (small bowel obstruction) ([place]) 08/28/2016  . History of coronary artery bypass graft 08/28/2016  . COPD with chronic bronchitis ([place]) 08/28/2016  . Essential hypertension 08/28/2016  . Chronic pain 08/28/2016  . ETOH abuse 08/28/2016  . Abdominal pain   . COPD (chronic obstructive pulmonary disease) ([place]) 08/13/2013  . Hypoxemia 08/13/2013  . Syncope 07/29/2013  . Dehydration 07/29/2013  . Alcohol abuse, continuous 08/11/2012  . Upper abdominal pain 08/11/2012  . Abnormal uterine bleeding 08/04/2012  . Fibroid uterus 08/04/2012  . Hypokalemia 03/24/2011  . Acute pancreatitis 03/22/2011  . BRONCHITIS, OBSTRUCTIVE CHRONIC 04/14/2010  . 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 WEIGHT LOSS 03/24/2010  . FIBROIDS, UTERUS 08/24/2009  . ACID REFLUX DISEASE 08/03/2009  . HYPERCHOLESTEROLEMIA 02/22/2009  . OBESITY 12/10/2008  . TOBACCO ABUSE 12/10/2008  . PANCREATITIS, CHRONIC 12/10/2008  . HEART DISEASE 12/02/2008  . OSA (obstructive sleep apnea) 12/02/2008  . Diabetes mellitus with complication ([place]) 123456  . CAD 09/25/1998    Past Surgical History:  Procedure Laterality Date  . CORONARY ANGIOPLASTY WITH STENT PLACEMENT  09/2009   "1"  . CORONARY ARTERY BYPASS GRAFT  ~ 2000   CABG X3  . HYSTEROSCOPY WITH NOVASURE N/A 09/04/2012   Procedure: HYSTEROSCOPY WITH NOVASURE;  Surgeon: Mora Bellman,  MD;  Location: [facility];  Service: Gynecology;  Laterality: N/A;  with removal intrauterine device  . LEEP N/A 12/25/2018   Procedure: LOOP ELECTROSURGICAL EXCISION PROCEDURE (LEEP);  Surgeon: Gae Dry, MD;  Location: [facility];  Service: Gynecology;  Laterality: N/A;  . ROBOTIC ASSISTED LAPAROSCOPIC CHOLECYSTECTOMY-MULTI SITE N/A 01/14/2018   Procedure: ROBOTIC ASSISTED LAPAROSCOPIC CHOLECYSTECTOMY-MULTI SITE;   Surgeon: Jules Husbands, MD;  Location: [facility];  Service: General;  Laterality: N/A;  . TUBAL LIGATION  1997     OB History    Gravida  3   Para  3   Term  3   Preterm      AB      Living  3     SAB      TAB      Ectopic      Multiple      Live Births              Family History  Problem Relation Age of Onset  . Diabetes Mother   . Coronary artery disease Mother   . Heart disease Mother   . Hyperlipidemia Mother   . Sudden death Mother   . Colon cancer Maternal Grandfather   . Liver disease Maternal Uncle   . Heart disease Maternal Uncle   . Heart disease Paternal Aunt   . Stroke Maternal Grandmother   . Breast cancer Neg Hx     Social History   Tobacco Use  . Smoking status: Current Every Day Smoker    Packs/day: 0.50    Years: 32.00    Pack years: 16.00    Types: Cigarettes  . Smokeless tobacco: Never Used  Substance Use Topics  . Alcohol use: Yes    Alcohol/week: 24.0 standard drinks    Types: 24 Glasses of wine per week    Comment: 08/28/2016 "3-4 glasses of wine qd; last glass was last night",  . Drug use: No    Home Medications Prior to Admission medications   Medication Sig Start Date End Date Taking? Authorizing Provider  clopidogrel (PLAVIX) 75 MG tablet Take 75 mg by mouth daily with lunch.     [provider]  hydrochlorothiazide (HYDRODIURIL) 25 MG tablet Take 25 mg by mouth daily with lunch.  08/15/12   Oswald Hillock, MD  ibuprofen (ADVIL) 200 MG tablet Take 400 mg by mouth every 6 (six) hours as needed for headache or moderate pain.    [provider]  losartan (COZAAR) 50 MG tablet Take 100 mg by mouth daily with lunch.     [provider]  metFORMIN (GLUCOPHAGE) 500 MG tablet Take 500 mg by mouth daily with lunch.     [provider]  metoprolol (LOPRESSOR) 100 MG tablet Take 100 mg by mouth daily with lunch.  05/30/15   [provider]  nitroGLYCERIN (NITROSTAT) 0.4 MG SL tablet Place  1 tablet (0.4 mg total) under the tongue every 5 (five) minutes as needed for chest pain. Maximum of 3 doses. 09/10/18   End, Harrell Gave, MD  oxybutynin (DITROPAN-XL) 5 MG 24 hr tablet Take 1 tablet (5 mg total) by mouth at bedtime. Patient taking differently: Take 5 mg by mouth daily as needed (urinary incontinence).  10/01/18   Gae Dry, MD  oxyCODONE-acetaminophen (PERCOCET/ROXICET) 5-325 MG tablet Take 1 tablet by mouth every 4 (four) hours as needed for moderate pain. 12/25/18   Gae Dry, MD  pravastatin (PRAVACHOL) 20 MG tablet Take 20 mg  by mouth daily with lunch.     [provider]    Allergies    Lisinopril  Review of Systems   Review of Systems  Constitutional: Negative for chills, fatigue and fever.  HENT: Negative for sore throat.   Eyes: Negative for visual disturbance.  Respiratory: Negative for cough and shortness of breath.   Cardiovascular: Negative for chest pain.  Gastrointestinal: Positive for abdominal pain. Negative for anorexia, constipation, diarrhea, flatus, hematemesis, hematochezia, melena, nausea and vomiting.  Genitourinary: Positive for dysuria. Negative for hematuria, vaginal bleeding and vaginal discharge.  Musculoskeletal: Negative for arthralgias.  Skin: Negative for rash.  Neurological: Negative for dizziness and light-headedness.  Psychiatric/Behavioral: Negative for agitation.  All other systems reviewed and are negative.   Physical Exam Updated Vital Signs BP 135/78 (BP Location: Right Arm)   Pulse 63   Temp 97.9 F (36.6 C) (Oral)   Resp 14   Ht 5\' 6"  (1.676 m)   Wt 54.4 kg   SpO2 96%   BMI 19.37 kg/m   Physical Exam Vitals and nursing note reviewed.  Constitutional:      General: She is not in acute distress.    Appearance: Normal appearance.  HENT:     Head: Normocephalic and atraumatic.     Nose: Nose normal.  Eyes:     Conjunctiva/sclera: Conjunctivae normal.     Pupils: Pupils are equal, round, and  reactive to light.  Cardiovascular:     Rate and Rhythm: Normal rate and regular rhythm.     Pulses: Normal pulses.     Heart sounds: Normal heart sounds.  Pulmonary:     Effort: Pulmonary effort is normal.     Breath sounds: Normal breath sounds.  Abdominal:     General: Abdomen is flat. Bowel sounds are increased.     Palpations: Abdomen is soft.     Tenderness: There is no abdominal tenderness. There is no guarding. Negative signs include Murphy's sign, McBurney's sign and psoas sign.  Musculoskeletal:        General: Normal range of motion.     Cervical back: Normal range of motion and neck supple.  Skin:    General: Skin is warm and dry.     Capillary Refill: Capillary refill takes less than 2 seconds.  Neurological:     General: No focal deficit present.     Mental Status: She is alert and oriented to person, place, and time.  Psychiatric:        Mood and Affect: Mood normal.        Behavior: Behavior normal.     ED Results / Procedures / Treatments   Labs (all labs ordered are listed, but only abnormal results are displayed) Results for orders placed or performed during the hospital encounter of 06/12/19  CBC with Differential/Platelet  Result Value Ref Range   WBC 5.2 4.0 - 10.5 K/uL   RBC 4.43 3.87 - 5.11 MIL/uL   Hemoglobin 14.0 12.0 - 15.0 g/dL   HCT 43.0 36.0 - 46.0 %   MCV 97.1 80.0 - 100.0 fL   MCH 31.6 26.0 - 34.0 pg   MCHC 32.6 30.0 - 36.0 g/dL   RDW 11.9 11.5 - 15.5 %   Platelets 201 150 - 400 K/uL   nRBC 0.0 0.0 - 0.2 %   Neutrophils Relative % 48 %   Neutro Abs 2.5 1.7 - 7.7 K/uL   Lymphocytes Relative 40 %   Lymphs Abs 2.1 0.7 - 4.0 K/uL  Monocytes Relative 12 %   Monocytes Absolute 0.6 0.1 - 1.0 K/uL   Eosinophils Relative 0 %   Eosinophils Absolute 0.0 0.0 - 0.5 K/uL   Basophils Relative 0 %   Basophils Absolute 0.0 0.0 - 0.1 K/uL   Immature Granulocytes 0 %   Abs Immature Granulocytes 0.01 0.00 - 0.07 K/uL  Urinalysis, Routine w reflex  microscopic  Result Value Ref Range   Color, Urine STRAW (A) YELLOW   APPearance CLEAR CLEAR   Specific Gravity, Urine 1.003 (L) 1.005 - 1.030   pH 5.0 5.0 - 8.0   Glucose, UA NEGATIVE NEGATIVE mg/dL   Hgb urine dipstick NEGATIVE NEGATIVE   Bilirubin Urine NEGATIVE NEGATIVE   Ketones, ur NEGATIVE NEGATIVE mg/dL   Protein, ur NEGATIVE NEGATIVE mg/dL   Nitrite NEGATIVE NEGATIVE   Leukocytes,Ua NEGATIVE NEGATIVE  Lipase, blood  Result Value Ref Range   Lipase 20 11 - 51 U/L  I-stat chem 8, ED (not at [facility] or [facility])  Result Value Ref Range   Sodium 138 135 - 145 mmol/L   Potassium 5.5 (H) 3.5 - 5.1 mmol/L   Chloride 103 98 - 111 mmol/L   BUN 16 6 - 20 mg/dL   Creatinine, Ser 1.00 0.44 - 1.00 mg/dL   Glucose, Bld 101 (H) 70 - 99 mg/dL   Calcium, Ion 1.07 (L) 1.15 - 1.40 mmol/L   TCO2 27 22 - 32 mmol/L   Hemoglobin 15.3 (H) 12.0 - 15.0 g/dL   HCT 45.0 36.0 - 46.0 %  I-Stat Beta hCG blood, ED ([facility], [facility], [facility] only)  Result Value Ref Range   I-stat hCG, quantitative <5.0 <5 mIU/mL   Comment 3           US Abdomen Complete  Result Date: 06/09/2019 CLINICAL DATA:  Abdominal pain. EXAM: ABDOMEN ULTRASOUND COMPLETE COMPARISON:  January 20, 2018. FINDINGS: Gallbladder: Status post cholecystectomy. Common bile duct: Diameter: 6 mm proximally and 9 mm distally, which most likely is within normal limits for post cholecystectomy status. Liver: No focal lesion identified. Within normal limits in parenchymal echogenicity. Portal vein is patent on color Doppler imaging with normal direction of blood flow towards the liver. IVC: No abnormality visualized. Pancreas: Visualized portion unremarkable. Spleen: Size and appearance within normal limits. Right Kidney: Length: 10.2 cm. Echogenicity within normal limits. No mass or hydronephrosis visualized. Left Kidney: Length: 4 cm. Echogenicity within normal limits. No mass or hydronephrosis visualized. Abdominal aorta: No aneurysm visualized. Other findings: None.  IMPRESSION: Status post cholecystectomy. No acute abnormality seen in the abdomen. Electronically Signed   By: Marijo Conception M.D.   On: 06/09/2019 15:41    Radiology No results found.  Procedures Procedures (including critical care time)  Medications Ordered in ED Medications  ketorolac (TORADOL) 30 MG/ML injection 30 mg (has no administration in time range)  phenazopyridine (PYRIDIUM) tablet 200 mg (has no administration in time range)    ED Course  I have reviewed the triage vital signs and the nursing notes.  Pertinent labs & imaging results that were available during my care of the patient were reviewed by me and considered in my medical decision making (see chart for details).    Well appearing already seen for this earlier in the week with normal [place]. PO challenged successfully in the department.  There are no signs of obstruction.  Patient is passing flatus and stool and did so just PTA.  Exam is benign and reassuring.  Will add bentyl to patient's home  narcotics.  Strict return precautions given.  Follow up with your PMD for ongoing care.    Marie [name] was evaluated in Emergency Department on 06/13/2019 for the symptoms described in the history of present illness. She was evaluated in the context of the global COVID-19 pandemic, which necessitated consideration that the patient might be at risk for infection with the SARS-CoV-2 virus that causes COVID-19. Institutional protocols and algorithms that pertain to the evaluation of patients at risk for COVID-19 are in a state of rapid change based on information released by regulatory bodies including the CDC and federal and state organizations. These policies and algorithms were followed during the patient's care in the ED.  Final Clinical Impression(s) / ED Diagnoses Return for weakness, numbness, changes in vision or speech, fevers >100.4 unrelieved by medication, shortness of breath, intractable vomiting, or diarrhea,  abdominal pain, Inability to tolerate liquids or food, cough, altered mental status or any concerns. No signs of systemic illness or infection. The patient is nontoxic-appearing on exam and vital signs are within normal limits.   I have reviewed the triage vital signs and the nursing notes. Pertinent labs &imaging results that were available during my care of the patient were reviewed by me and considered in my medical decision making (see chart for details).  After history, exam, and medical workup I feel the patient has been appropriately medically screened and is safe for discharge home. Pertinent diagnoses were discussed with the patient. Patient was given return precautions    Kyrollos Cordell, MD 06/13/19 [id number]

## 2019-08-05 ENCOUNTER — Telehealth: Payer: Self-pay | Admitting: Internal Medicine

## 2019-08-05 NOTE — Telephone Encounter (Signed)
3 attempts to schedule fu appt from recall list.   Deleting recall.   

## 2019-12-01 ENCOUNTER — Other Ambulatory Visit: Payer: Self-pay | Admitting: Family Medicine

## 2019-12-01 DIAGNOSIS — Z1231 Encounter for screening mammogram for malignant neoplasm of breast: Secondary | ICD-10-CM

## 2019-12-15 ENCOUNTER — Ambulatory Visit
Admission: RE | Admit: 2019-12-15 | Discharge: 2019-12-15 | Disposition: A | Discharge status: Home / Self Care | Payer: 59 | Source: Ambulatory Visit | Attending: Family Medicine | Admitting: Family Medicine

## 2019-12-15 ENCOUNTER — Ambulatory Visit: Payer: 59

## 2019-12-15 DIAGNOSIS — Z1231 Encounter for screening mammogram for malignant neoplasm of breast: Secondary | ICD-10-CM | POA: Insufficient documentation

## 2019-12-16 ENCOUNTER — Other Ambulatory Visit: Payer: Self-pay | Admitting: Family Medicine

## 2019-12-18 ENCOUNTER — Other Ambulatory Visit: Payer: Self-pay | Admitting: Family Medicine

## 2019-12-18 DIAGNOSIS — R928 Other abnormal and inconclusive findings on diagnostic imaging of breast: Secondary | ICD-10-CM

## 2019-12-18 DIAGNOSIS — N6310 Unspecified lump in the right breast, unspecified quadrant: Secondary | ICD-10-CM

## 2019-12-28 ENCOUNTER — Ambulatory Visit
Admission: RE | Admit: 2019-12-28 | Discharge: 2019-12-28 | Disposition: A | Discharge status: Home / Self Care | Payer: 59 | Source: Ambulatory Visit | Attending: Family Medicine | Admitting: Family Medicine

## 2019-12-28 ENCOUNTER — Other Ambulatory Visit: Payer: Self-pay

## 2019-12-28 DIAGNOSIS — N6310 Unspecified lump in the right breast, unspecified quadrant: Secondary | ICD-10-CM | POA: Insufficient documentation

## 2019-12-28 DIAGNOSIS — R928 Other abnormal and inconclusive findings on diagnostic imaging of breast: Secondary | ICD-10-CM

## 2020-01-11 ENCOUNTER — Other Ambulatory Visit: Payer: Self-pay | Admitting: Family Medicine

## 2020-02-08 ENCOUNTER — Other Ambulatory Visit: Payer: Self-pay | Admitting: Family Medicine

## 2020-03-07 ENCOUNTER — Other Ambulatory Visit: Payer: Self-pay | Admitting: Family Medicine

## 2020-03-08 ENCOUNTER — Other Ambulatory Visit: Payer: Self-pay

## 2020-03-08 ENCOUNTER — Emergency Department (HOSPITAL_COMMUNITY)
Admission: EM | Admit: 2020-03-08 | Discharge: 2020-03-09 | Disposition: A | Discharge status: Home / Self Care | Payer: 59 | Attending: Emergency Medicine | Admitting: Emergency Medicine

## 2020-03-08 ENCOUNTER — Encounter (HOSPITAL_COMMUNITY): Payer: Self-pay | Admitting: Emergency Medicine

## 2020-03-08 DIAGNOSIS — Z7901 Long term (current) use of anticoagulants: Secondary | ICD-10-CM | POA: Insufficient documentation

## 2020-03-08 DIAGNOSIS — F10920 Alcohol use, unspecified with intoxication, uncomplicated: Secondary | ICD-10-CM

## 2020-03-08 DIAGNOSIS — S0990XA Unspecified injury of head, initial encounter: Secondary | ICD-10-CM | POA: Diagnosis present

## 2020-03-08 DIAGNOSIS — S0993XA Unspecified injury of face, initial encounter: Secondary | ICD-10-CM | POA: Diagnosis not present

## 2020-03-08 DIAGNOSIS — W19XXXA Unspecified fall, initial encounter: Secondary | ICD-10-CM | POA: Diagnosis not present

## 2020-03-08 DIAGNOSIS — Z79899 Other long term (current) drug therapy: Secondary | ICD-10-CM | POA: Diagnosis not present

## 2020-03-08 DIAGNOSIS — E119 Type 2 diabetes mellitus without complications: Secondary | ICD-10-CM | POA: Insufficient documentation

## 2020-03-08 DIAGNOSIS — Z7984 Long term (current) use of oral hypoglycemic drugs: Secondary | ICD-10-CM | POA: Diagnosis not present

## 2020-03-08 DIAGNOSIS — M25551 Pain in right hip: Secondary | ICD-10-CM | POA: Insufficient documentation

## 2020-03-08 DIAGNOSIS — J449 Chronic obstructive pulmonary disease, unspecified: Secondary | ICD-10-CM | POA: Diagnosis not present

## 2020-03-08 DIAGNOSIS — I2510 Atherosclerotic heart disease of native coronary artery without angina pectoris: Secondary | ICD-10-CM | POA: Diagnosis not present

## 2020-03-08 DIAGNOSIS — Z951 Presence of aortocoronary bypass graft: Secondary | ICD-10-CM | POA: Insufficient documentation

## 2020-03-08 DIAGNOSIS — I10 Essential (primary) hypertension: Secondary | ICD-10-CM | POA: Insufficient documentation

## 2020-03-08 DIAGNOSIS — F17210 Nicotine dependence, cigarettes, uncomplicated: Secondary | ICD-10-CM | POA: Diagnosis not present

## 2020-03-08 DIAGNOSIS — F10129 Alcohol abuse with intoxication, unspecified: Secondary | ICD-10-CM | POA: Diagnosis not present

## 2020-03-08 DIAGNOSIS — I509 Heart failure, unspecified: Secondary | ICD-10-CM | POA: Insufficient documentation

## 2020-03-08 NOTE — ED Provider Notes (Signed)
[facility] EMERGENCY DEPARTMENT Provider Note   CSN: [id number] Arrival date & time: 03/08/20  2338     History Chief Complaint  Patient presents with  . Fall/ETOH    Sunday Spillers TORRE PIKUS is a 54 y.o. female.  The history is provided by the patient and medical records.    LEVEL V CAVEAT:  INTOXICATION  54 y.o. F with hx of  CHF, chronic pancreatitis, HTN, obesity, OSA, sarcoidosis, DM2, presenting to the ED after a fall.  EMS called to the home as patient wedged between the bed and nightstand.  Did have EtOH this evening.  Unsure of LOC.  Complains of right hip pain, worse with palpation and movement.  C-collar is in place.  She does take Plavix but no anticoagulation.  Past Medical History:  Diagnosis Date  . CHF (congestive heart failure) ([place])   . Chronic pancreatitis ([place])   . Coronary atherosclerosis of unspecified type of vessel, native or graft   . FWYOVZCH(885.0)    "weekly" (07/29/2013)  . Hypertension   . Leiomyoma of uterus, unspecified   . Loss of weight   . Obesity, unspecified   . OSA (obstructive sleep apnea)    no CPAP  . Pure hypercholesterolemia   . Sarcoidosis   . Shortness of breath    none since stent 2 yrs ago  . Type II diabetes mellitus [facility])     Patient Active Problem List   Diagnosis Date Noted  . Personal history of cervical dysplasia 01/15/2019  . CIN III (cervical intraepithelial neoplasia grade III) with severe dysplasia 10/23/2018  . Atypical chest pain 09/11/2018  . Shortness of breath 09/11/2018  . CAD in native artery 09/11/2018  . Hyperlipidemia LDL goal <70 09/11/2018  . Acute on chronic pancreatitis ([place]) 01/20/2018  . Calculus of gallbladder without cholecystitis without obstruction   . Malnutrition of moderate degree 08/30/2016  . SBO (small bowel obstruction) ([place]) 08/28/2016  . History of coronary artery bypass graft 08/28/2016  . COPD with chronic bronchitis ([place]) 08/28/2016  . Essential hypertension  08/28/2016  . Chronic pain 08/28/2016  . ETOH abuse 08/28/2016  . Abdominal pain   . COPD (chronic obstructive pulmonary disease) ([place]) 08/13/2013  . Hypoxemia 08/13/2013  . Syncope 07/29/2013  . Dehydration 07/29/2013  . Alcohol abuse, continuous 08/11/2012  . Upper abdominal pain 08/11/2012  . Abnormal uterine bleeding 08/04/2012  . Fibroid uterus 08/04/2012  . Hypokalemia 03/24/2011  . Acute pancreatitis 03/22/2011  . BRONCHITIS, OBSTRUCTIVE CHRONIC 04/14/2010  . 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 WEIGHT LOSS 03/24/2010  . FIBROIDS, UTERUS 08/24/2009  . ACID REFLUX DISEASE 08/03/2009  . HYPERCHOLESTEROLEMIA 02/22/2009  . OBESITY 12/10/2008  . TOBACCO ABUSE 12/10/2008  . PANCREATITIS, CHRONIC 12/10/2008  . HEART DISEASE 12/02/2008  . OSA (obstructive sleep apnea) 12/02/2008  . Diabetes mellitus with complication ([place]) 27/74/1287  . CAD 09/25/1998    Past Surgical History:  Procedure Laterality Date  . BREAST CYST ASPIRATION     does not remember which breast  . BREAST EXCISIONAL BIOPSY     does not remember which breast  . CORONARY ANGIOPLASTY WITH STENT PLACEMENT  09/2009   "1"  . CORONARY ARTERY BYPASS GRAFT  ~ 2000   CABG X3  . HYSTEROSCOPY WITH NOVASURE N/A 09/04/2012   Procedure: HYSTEROSCOPY WITH NOVASURE;  Surgeon: Mora Bellman, MD;  Location: [facility];  Service: Gynecology;  Laterality: N/A;  with removal intrauterine device  . LEEP N/A 12/25/2018   Procedure: LOOP ELECTROSURGICAL EXCISION PROCEDURE (  LEEP);  Surgeon: Gae Dry, MD;  Location: [facility];  Service: Gynecology;  Laterality: N/A;  . ROBOTIC ASSISTED LAPAROSCOPIC CHOLECYSTECTOMY-MULTI SITE N/A 01/14/2018   Procedure: ROBOTIC ASSISTED LAPAROSCOPIC CHOLECYSTECTOMY-MULTI SITE;  Surgeon: Jules Husbands, MD;  Location: [facility];  Service: General;  Laterality: N/A;  . TUBAL LIGATION  1997     OB History    Gravida  3   Para  3   Term  3   Preterm      AB      Living  3     SAB       IAB      Ectopic      Multiple      Live Births              Family History  Problem Relation Age of Onset  . Diabetes Mother   . Coronary artery disease Mother   . Heart disease Mother   . Hyperlipidemia Mother   . Sudden death Mother   . Colon cancer Maternal Grandfather   . Liver disease Maternal Uncle   . Heart disease Maternal Uncle   . Heart disease Paternal Aunt   . Stroke Maternal Grandmother   . Breast cancer Neg Hx     Social History   Tobacco Use  . Smoking status: Current Every Day Smoker    Packs/day: 0.50    Years: 32.00    Pack years: 16.00    Types: Cigarettes  . Smokeless tobacco: Never Used  Vaping Use  . Vaping Use: Never used  Substance Use Topics  . Alcohol use: Yes    Alcohol/week: 24.0 standard drinks    Types: 24 Glasses of wine per week    Comment: 08/28/2016 "3-4 glasses of wine qd; last glass was last night",  . Drug use: No    Home Medications Prior to Admission medications   Medication Sig Start Date End Date Taking? Authorizing Provider  clopidogrel (PLAVIX) 75 MG tablet Take 75 mg by mouth daily with lunch.     [provider]  dicyclomine (BENTYL) 20 MG tablet Take 1 tablet (20 mg total) by mouth 2 (two) times daily. 06/13/19   Palumbo, April, MD  hydrochlorothiazide (HYDRODIURIL) 25 MG tablet Take 25 mg by mouth daily with lunch.  08/15/12   Oswald Hillock, MD  ibuprofen (ADVIL) 200 MG tablet Take 400 mg by mouth every 6 (six) hours as needed for headache or moderate pain.    [provider]  losartan (COZAAR) 50 MG tablet Take 100 mg by mouth daily with lunch.     [provider]  metFORMIN (GLUCOPHAGE) 500 MG tablet Take 500 mg by mouth daily with lunch.     [provider]  metoprolol (LOPRESSOR) 100 MG tablet Take 100 mg by mouth daily with lunch.  05/30/15   [provider]  nitroGLYCERIN (NITROSTAT) 0.4 MG SL tablet Place 1 tablet (0.4 mg total) under the tongue every 5 (five) minutes  as needed for chest pain. Maximum of 3 doses. 09/10/18   End, Harrell Gave, MD  oxybutynin (DITROPAN-XL) 5 MG 24 hr tablet Take 1 tablet (5 mg total) by mouth at bedtime. Patient not taking: Reported on 06/13/2019 10/01/18   Gae Dry, MD  oxyCODONE-acetaminophen (PERCOCET/ROXICET) 5-325 MG tablet Take 1 tablet by mouth every 4 (four) hours as needed for moderate pain. Patient not taking: Reported on 06/13/2019 12/25/18   Gae Dry, MD  pravastatin (PRAVACHOL) 20 MG tablet  Take 20 mg by mouth daily with lunch.     [provider]    Allergies    Lisinopril  Review of Systems   Review of Systems  Unable to perform ROS: Other    Physical Exam Updated Vital Signs Ht 5\' 3"  (1.6 m)   Wt 61 kg   BMI 23.82 kg/m   Physical Exam Vitals and nursing note reviewed.  Constitutional:      Appearance: She is well-developed and well-nourished.     Comments: Appears intoxicated, awake, alert  HENT:     Head: Normocephalic and atraumatic.     Comments: No significant head trauma    Mouth/Throat:     Mouth: Oropharynx is clear and moist.  Eyes:     Extraocular Movements: EOM normal.     Conjunctiva/sclera: Conjunctivae normal.     Pupils: Pupils are equal, round, and reactive to light.  Neck:     Comments: c-collar in place Cardiovascular:     Rate and Rhythm: Normal rate and regular rhythm.     Heart sounds: Normal heart sounds.  Pulmonary:     Effort: Pulmonary effort is normal.     Breath sounds: Normal breath sounds.  Abdominal:     General: Bowel sounds are normal.     Palpations: Abdomen is soft.  Musculoskeletal:        General: Normal range of motion.     Comments: Pain with palpation of right hip or any attempted movement, no gross deformity, no leg shortening, DP pulses intact BLE  Skin:    General: Skin is warm and dry.  Neurological:     Mental Status: She is alert.     Comments: Intoxicated but awake/alert, answers simple questions and follows commands   Psychiatric:        Mood and Affect: Mood and affect normal.     ED Results / Procedures / Treatments   Labs (all labs ordered are listed, but only abnormal results are displayed) Labs Reviewed  CBC WITH DIFFERENTIAL/PLATELET - Abnormal; Notable for the following components:      Result Value   Hemoglobin 15.2 (*)    HCT 46.6 (*)    All other components within normal limits  COMPREHENSIVE METABOLIC PANEL - Abnormal; Notable for the following components:   AST 79 (*)    ALT 45 (*)    All other components within normal limits  ETHANOL - Abnormal; Notable for the following components:   Alcohol, Ethyl (B) 351 (*)    All other components within normal limits    EKG None  Radiology DG Chest 1 View  Result Date: 03/09/2020 CLINICAL DATA:  Fall, right hip pain EXAM: CHEST  1 VIEW COMPARISON:  12/19/2017 FINDINGS: Bilateral upper lobe scarring. Heart is normal size. No acute confluent airspace opacities or effusions. Prior CABG. No acute bony abnormality. IMPRESSION: Bilateral upper lobe scarring.  No active disease. Electronically Signed   By: Rolm Baptise M.D.   On: 03/09/2020 00:30   CT Head Wo Contrast  Result Date: 03/09/2020 CLINICAL DATA:  Fall, intoxication, facial trauma EXAM: CT HEAD WITHOUT CONTRAST CT CERVICAL SPINE WITHOUT CONTRAST TECHNIQUE: Multidetector CT imaging of the head and cervical spine was performed following the standard protocol without intravenous contrast. Multiplanar CT image reconstructions of the cervical spine were also generated. COMPARISON:  None. FINDINGS: CT HEAD FINDINGS Brain: Normal anatomic configuration. No abnormal intra or extra-axial mass lesion or fluid collection. No abnormal mass effect or midline shift. No evidence  of acute intracranial hemorrhage or infarct. Ventricular size is normal. Cerebellum unremarkable. Vascular: Unremarkable Skull: Intact Sinuses/Orbits: Paranasal sinuses are clear. Orbits are unremarkable. Other: Mastoid air  cells and middle ear cavities are clear. CT CERVICAL SPINE FINDINGS Alignment: Normal. Skull base and vertebrae: No acute fracture. No primary bone lesion or focal pathologic process. Soft tissues and spinal canal: No prevertebral fluid or swelling. No visible canal hematoma. Disc levels: Sagittal reformats demonstrates preservation of vertebral body height and intervertebral disc height. Spinal canal is widely patent. Prevertebral soft tissues are not thickened. Axial images demonstrates no significant uncovertebral or facet arthrosis. No significant neural foraminal narrowing or canal stenosis. Upper chest: Unremarkable Other: None significant IMPRESSION: No acute intracranial abnormality.  No calvarial fracture. No acute fracture or listhesis of the cervical spine. Electronically Signed   By: Fidela Salisbury MD   On: 03/09/2020 00:46   CT Cervical Spine Wo Contrast  Result Date: 03/09/2020 CLINICAL DATA:  Fall, intoxication, facial trauma EXAM: CT HEAD WITHOUT CONTRAST CT CERVICAL SPINE WITHOUT CONTRAST TECHNIQUE: Multidetector CT imaging of the head and cervical spine was performed following the standard protocol without intravenous contrast. Multiplanar CT image reconstructions of the cervical spine were also generated. COMPARISON:  None. FINDINGS: CT HEAD FINDINGS Brain: Normal anatomic configuration. No abnormal intra or extra-axial mass lesion or fluid collection. No abnormal mass effect or midline shift. No evidence of acute intracranial hemorrhage or infarct. Ventricular size is normal. Cerebellum unremarkable. Vascular: Unremarkable Skull: Intact Sinuses/Orbits: Paranasal sinuses are clear. Orbits are unremarkable. Other: Mastoid air cells and middle ear cavities are clear. CT CERVICAL SPINE FINDINGS Alignment: Normal. Skull base and vertebrae: No acute fracture. No primary bone lesion or focal pathologic process. Soft tissues and spinal canal: No prevertebral fluid or swelling. No visible canal  hematoma. Disc levels: Sagittal reformats demonstrates preservation of vertebral body height and intervertebral disc height. Spinal canal is widely patent. Prevertebral soft tissues are not thickened. Axial images demonstrates no significant uncovertebral or facet arthrosis. No significant neural foraminal narrowing or canal stenosis. Upper chest: Unremarkable Other: None significant IMPRESSION: No acute intracranial abnormality.  No calvarial fracture. No acute fracture or listhesis of the cervical spine. Electronically Signed   By: Fidela Salisbury MD   On: 03/09/2020 00:46   DG Hip Unilat W or Wo Pelvis 2-3 Views Right  Result Date: 03/09/2020 CLINICAL DATA:  Fall, right hip pain EXAM: DG HIP (WITH OR WITHOUT PELVIS) 2-3V RIGHT COMPARISON:  None. FINDINGS: There is no evidence of hip fracture or dislocation. There is no evidence of arthropathy or other focal bone abnormality. IMPRESSION: Negative. Electronically Signed   By: Rolm Baptise M.D.   On: 03/09/2020 00:29    Procedures Procedures (including critical care time)  Medications Ordered in ED Medications - No data to display  ED Course  I have reviewed the triage vital signs and the nursing notes.  Pertinent labs & imaging results that were available during my care of the patient were reviewed by me and considered in my medical decision making (see chart for details).    MDM Rules/Calculators/A&P  54 year old female presenting to the ED after a fall.  She was found down on floor on right side, wedged between bed and nightstand.  Unsure of loss of consciousness.  On arrival she appears intoxicated but is awake and alert.  She is able to answer some simple questions and follow commands.  Complains of pain in the right hip but there is no significant deformity.  Her  leg is neurovascular intact.  C-collar is in place without severe signs of head trauma.  Was reported to be on anticoagulation, however it appears she is only on Plavix.  Given  unwitnessed injury and current intoxication, will obtain screening head and neck CT along with chest and right hip films.  Labs are pending.  Imaging negative for any acute findings.  Her ethanol is 351.  Does have some elevation in LFTs which I suspect is from alcohol use.  Patient monitored here for several hours becoming more clinically sober.  She was able to get up and ambulate to the bathroom without difficulty.  Her vitals remained stable.  Feel she is stable for discharge home.  Encouraged responsible alcohol use in the future.  Tylenol or motrin as needed for pain.  Close follow-up with PCP.  Return here for any new/acute changes.  Final Clinical Impression(s) / ED Diagnoses Final diagnoses:  Alcoholic intoxication without complication [facility])    Rx / DC Orders ED Discharge Orders    None       Larene Pickett, PA-C 03/09/20 [id number]    Merryl Hacker, MD 03/13/20 [id number]

## 2020-03-08 NOTE — ED Triage Notes (Addendum)
Patient arrived with EMS from home , found on the floor beside her bed/night stand , ETOH this evening , reports pain at right hip worse with movement / changing positions , respirations unlabored , alert and oriented .

## 2020-03-09 ENCOUNTER — Emergency Department (HOSPITAL_COMMUNITY): Payer: 59

## 2020-03-09 LAB — COMPREHENSIVE METABOLIC PANEL
ALT: 45 U/L — ABNORMAL HIGH (ref 0–44)
AST: 79 U/L — ABNORMAL HIGH (ref 15–41)
Albumin: 3.6 g/dL (ref 3.5–5.0)
Alkaline Phosphatase: 86 U/L (ref 38–126)
Anion gap: 9 (ref 5–15)
BUN: 9 mg/dL (ref 6–20)
CO2: 26 mmol/L (ref 22–32)
Calcium: 9.6 mg/dL (ref 8.9–10.3)
Chloride: 106 mmol/L (ref 98–111)
Creatinine, Ser: 0.69 mg/dL (ref 0.44–1.00)
GFR, Estimated: >60 mL/min (ref >60)
Glucose, Bld: 95 mg/dL (ref 70–99)
Potassium: 3.9 mmol/L (ref 3.5–5.1)
Sodium: 141 mmol/L (ref 135–145)
Total Bilirubin: 0.5 mg/dL (ref 0.3–1.2)
Total Protein: 7.3 g/dL (ref 6.5–8.1)

## 2020-03-09 LAB — ETHANOL: Alcohol, Ethyl (B): 351 mg/dL (ref <10)

## 2020-03-09 LAB — CBC WITH DIFFERENTIAL/PLATELET
Abs Immature Granulocytes: 0.02 10*3/uL (ref 0.00–0.07)
Basophils Absolute: 0 10*3/uL (ref 0.0–0.1)
Basophils Relative: 0 %
Eosinophils Absolute: 0 10*3/uL (ref 0.0–0.5)
Eosinophils Relative: 0 %
HCT: 46.6 % — ABNORMAL HIGH (ref 36.0–46.0)
Hemoglobin: 15.2 g/dL — ABNORMAL HIGH (ref 12.0–15.0)
Immature Granulocytes: 0 %
Lymphocytes Relative: 31 %
Lymphs Abs: 1.6 10*3/uL (ref 0.7–4.0)
MCH: 32.3 pg (ref 26.0–34.0)
MCHC: 32.6 g/dL (ref 30.0–36.0)
MCV: 98.9 fL (ref 80.0–100.0)
Monocytes Absolute: 0.7 10*3/uL (ref 0.1–1.0)
Monocytes Relative: 13 %
Neutro Abs: 2.8 10*3/uL (ref 1.7–7.7)
Neutrophils Relative %: 56 %
Platelets: 188 10*3/uL (ref 150–400)
RBC: 4.71 MIL/uL (ref 3.87–5.11)
RDW: 12.3 % (ref 11.5–15.5)
WBC: 5.1 10*3/uL (ref 4.0–10.5)
nRBC: 0 % (ref 0.0–0.2)

## 2020-03-09 NOTE — ED Notes (Signed)
Patient transported to CT scan . 

## 2020-03-09 NOTE — Discharge Instructions (Addendum)
Imaging today negative, you will likely be sore for a bit.  Recommend tylenol or motrin as needed for pain. Recommend to drink responsibly in the future to avoid further falls/injury. Follow-up with your primary care doctor. Return here for any new/acute changes.

## 2020-03-09 NOTE — ED Notes (Addendum)
Patient ambulated down hall and back to room. Had to use rail in hall once to steady self, but otherwise was able to ambulate independently. [facility], [place] made aware of same.

## 2020-03-11 ENCOUNTER — Other Ambulatory Visit: Payer: Self-pay | Admitting: Internal Medicine

## 2020-03-11 ENCOUNTER — Ambulatory Visit: Payer: 59 | Attending: Internal Medicine

## 2020-03-11 DIAGNOSIS — Z23 Encounter for immunization: Secondary | ICD-10-CM

## 2020-03-11 NOTE — Progress Notes (Signed)
   Covid-19 Vaccination Clinic  Name:  Marie [name]    MRN: [id number] DOB: 12-22-1965  03/11/2020  [name] was observed post Covid-19 immunization for 15 minutes without incident. She was provided with Vaccine Information Sheet and instruction to access the V-Safe system.   [name] was instructed to call 911 with any severe reactions post vaccine: Marland Kitchen Difficulty breathing  . Swelling of face and throat  . A fast heartbeat  . A bad rash all over body  . Dizziness and weakness   Immunizations Administered    Name Date Dose VIS Date Route   Pfizer COVID-19 Vaccine 03/11/2020  9:17 AM 0.3 mL 01/13/2020 Intramuscular   Manufacturer: [place]   Lot: [id number]   [place]: [id number]

## 2020-04-04 ENCOUNTER — Other Ambulatory Visit: Payer: Self-pay | Admitting: Family Medicine

## 2020-05-02 ENCOUNTER — Other Ambulatory Visit: Payer: Self-pay | Admitting: Family Medicine

## 2020-05-27 ENCOUNTER — Other Ambulatory Visit: Payer: Self-pay | Admitting: Family Medicine

## 2020-06-10 ENCOUNTER — Other Ambulatory Visit: Payer: Self-pay | Admitting: Family Medicine

## 2020-06-24 ENCOUNTER — Other Ambulatory Visit: Payer: Self-pay | Admitting: Family Medicine

## 2020-06-27 ENCOUNTER — Other Ambulatory Visit: Payer: Self-pay

## 2020-06-27 MED FILL — Hydrocodone-Acetaminophen Tab 5-325 MG: ORAL | 6 days supply | Qty: 24 | Fill #0 | Status: AC

## 2020-07-25 ENCOUNTER — Other Ambulatory Visit: Payer: Self-pay

## 2020-07-25 MED ORDER — HYDROCODONE-ACETAMINOPHEN 5-325 MG PO TABS
ORAL_TABLET | ORAL | 0 refills | Status: DC
Start: 2020-07-25 — End: 2020-08-23
  Filled 2020-07-25: qty 24, 6d supply, fill #0

## 2020-08-23 ENCOUNTER — Other Ambulatory Visit: Payer: Self-pay

## 2020-08-23 MED ORDER — HYDROCODONE-ACETAMINOPHEN 5-325 MG PO TABS
ORAL_TABLET | ORAL | 0 refills | Status: DC
Start: 2020-08-23 — End: 2020-09-21
  Filled 2020-08-23: qty 24, 6d supply, fill #0

## 2020-08-24 ENCOUNTER — Other Ambulatory Visit: Payer: Self-pay

## 2020-09-21 ENCOUNTER — Other Ambulatory Visit: Payer: Self-pay

## 2020-09-21 MED ORDER — HYDROCODONE-ACETAMINOPHEN 5-325 MG PO TABS
ORAL_TABLET | ORAL | 0 refills | Status: DC
Start: 2020-09-21 — End: 2020-10-18
  Filled 2020-09-21: qty 24, 6d supply, fill #0

## 2020-10-18 ENCOUNTER — Other Ambulatory Visit: Payer: Self-pay

## 2020-10-18 MED ORDER — CLOPIDOGREL BISULFATE 75 MG PO TABS
ORAL_TABLET | ORAL | 0 refills | Status: DC
Start: 2020-10-18 — End: 2020-11-11
  Filled 2020-10-18: qty 90, 90d supply, fill #0

## 2020-10-18 MED ORDER — METFORMIN HCL 500 MG PO TABS
ORAL_TABLET | ORAL | 0 refills | Status: DC
  Filled 2020-10-18: qty 90, 90d supply, fill #0

## 2020-10-18 MED ORDER — HYDROCHLOROTHIAZIDE 25 MG PO TABS
ORAL_TABLET | ORAL | 0 refills | Status: DC
Start: 2020-10-18 — End: 2020-11-11
  Filled 2020-10-18: qty 90, 90d supply, fill #0

## 2020-10-18 MED ORDER — METOPROLOL TARTRATE 100 MG PO TABS
ORAL_TABLET | ORAL | 0 refills | Status: DC
  Filled 2020-10-18: qty 90, 90d supply, fill #0

## 2020-10-18 MED ORDER — LOSARTAN POTASSIUM 100 MG PO TABS
ORAL_TABLET | ORAL | 0 refills | Status: DC
  Filled 2020-10-18: qty 90, 90d supply, fill #0

## 2020-10-18 MED ORDER — PRAVASTATIN SODIUM 20 MG PO TABS
ORAL_TABLET | ORAL | 0 refills | Status: DC
  Filled 2020-10-18: qty 90, 90d supply, fill #0

## 2020-10-18 MED ORDER — HYDROCODONE-ACETAMINOPHEN 5-325 MG PO TABS
ORAL_TABLET | ORAL | 0 refills | Status: DC
Start: 2020-10-18 — End: 2020-11-12
  Filled 2020-10-18: qty 24, 6d supply, fill #0

## 2020-10-19 ENCOUNTER — Other Ambulatory Visit: Payer: Self-pay

## 2020-11-01 ENCOUNTER — Other Ambulatory Visit: Payer: Self-pay

## 2020-11-02 ENCOUNTER — Other Ambulatory Visit: Payer: Self-pay

## 2020-11-02 MED ORDER — PEG 3350-KCL-NA BICARB-NACL 420 G PO SOLR
ORAL | 0 refills | Status: DC
  Filled 2020-11-02: qty 4000, 1d supply, fill #0

## 2020-11-03 ENCOUNTER — Other Ambulatory Visit: Payer: Self-pay

## 2020-11-10 ENCOUNTER — Other Ambulatory Visit: Payer: Self-pay

## 2020-11-10 ENCOUNTER — Inpatient Hospital Stay
Admission: EM | Admit: 2020-11-10 | Discharge: 2020-11-15 | DRG: 177 | Disposition: A | Discharge status: Home / Self Care | Payer: 59 | Attending: Internal Medicine | Admitting: Internal Medicine

## 2020-11-10 ENCOUNTER — Encounter: Payer: Self-pay | Admitting: *Deleted

## 2020-11-10 ENCOUNTER — Emergency Department: Payer: 59

## 2020-11-10 DIAGNOSIS — J9601 Acute respiratory failure with hypoxia: Secondary | ICD-10-CM

## 2020-11-10 DIAGNOSIS — Z833 Family history of diabetes mellitus: Secondary | ICD-10-CM

## 2020-11-10 DIAGNOSIS — Z83438 Family history of other disorder of lipoprotein metabolism and other lipidemia: Secondary | ICD-10-CM

## 2020-11-10 DIAGNOSIS — I10 Essential (primary) hypertension: Secondary | ICD-10-CM | POA: Diagnosis present

## 2020-11-10 DIAGNOSIS — Z955 Presence of coronary angioplasty implant and graft: Secondary | ICD-10-CM

## 2020-11-10 DIAGNOSIS — F1010 Alcohol abuse, uncomplicated: Secondary | ICD-10-CM | POA: Diagnosis not present

## 2020-11-10 DIAGNOSIS — U071 COVID-19: Secondary | ICD-10-CM | POA: Diagnosis not present

## 2020-11-10 DIAGNOSIS — R54 Age-related physical debility: Secondary | ICD-10-CM | POA: Diagnosis present

## 2020-11-10 DIAGNOSIS — J9600 Acute respiratory failure, unspecified whether with hypoxia or hypercapnia: Secondary | ICD-10-CM

## 2020-11-10 DIAGNOSIS — E7800 Pure hypercholesterolemia, unspecified: Secondary | ICD-10-CM | POA: Diagnosis present

## 2020-11-10 DIAGNOSIS — J449 Chronic obstructive pulmonary disease, unspecified: Secondary | ICD-10-CM | POA: Diagnosis present

## 2020-11-10 DIAGNOSIS — E669 Obesity, unspecified: Secondary | ICD-10-CM | POA: Diagnosis present

## 2020-11-10 DIAGNOSIS — Z888 Allergy status to other drugs, medicaments and biological substances status: Secondary | ICD-10-CM

## 2020-11-10 DIAGNOSIS — Y908 Blood alcohol level of 240 mg/100 ml or more: Secondary | ICD-10-CM | POA: Diagnosis present

## 2020-11-10 DIAGNOSIS — I2510 Atherosclerotic heart disease of native coronary artery without angina pectoris: Secondary | ICD-10-CM | POA: Diagnosis present

## 2020-11-10 DIAGNOSIS — G8929 Other chronic pain: Secondary | ICD-10-CM | POA: Diagnosis present

## 2020-11-10 DIAGNOSIS — F17210 Nicotine dependence, cigarettes, uncomplicated: Secondary | ICD-10-CM | POA: Diagnosis present

## 2020-11-10 DIAGNOSIS — E119 Type 2 diabetes mellitus without complications: Secondary | ICD-10-CM | POA: Diagnosis present

## 2020-11-10 DIAGNOSIS — I119 Hypertensive heart disease without heart failure: Secondary | ICD-10-CM | POA: Diagnosis present

## 2020-11-10 DIAGNOSIS — K860 Alcohol-induced chronic pancreatitis: Secondary | ICD-10-CM

## 2020-11-10 DIAGNOSIS — G4733 Obstructive sleep apnea (adult) (pediatric): Secondary | ICD-10-CM | POA: Diagnosis present

## 2020-11-10 DIAGNOSIS — Z6822 Body mass index (BMI) 22.0-22.9, adult: Secondary | ICD-10-CM

## 2020-11-10 DIAGNOSIS — Z7902 Long term (current) use of antithrombotics/antiplatelets: Secondary | ICD-10-CM

## 2020-11-10 DIAGNOSIS — K861 Other chronic pancreatitis: Secondary | ICD-10-CM | POA: Diagnosis present

## 2020-11-10 DIAGNOSIS — Z720 Tobacco use: Secondary | ICD-10-CM | POA: Diagnosis present

## 2020-11-10 DIAGNOSIS — Z9049 Acquired absence of other specified parts of digestive tract: Secondary | ICD-10-CM

## 2020-11-10 DIAGNOSIS — Z7984 Long term (current) use of oral hypoglycemic drugs: Secondary | ICD-10-CM

## 2020-11-10 DIAGNOSIS — J1282 Pneumonia due to coronavirus disease 2019: Secondary | ICD-10-CM | POA: Diagnosis present

## 2020-11-10 DIAGNOSIS — Z716 Tobacco abuse counseling: Secondary | ICD-10-CM

## 2020-11-10 DIAGNOSIS — E118 Type 2 diabetes mellitus with unspecified complications: Secondary | ICD-10-CM | POA: Diagnosis present

## 2020-11-10 DIAGNOSIS — Z951 Presence of aortocoronary bypass graft: Secondary | ICD-10-CM

## 2020-11-10 DIAGNOSIS — Z79899 Other long term (current) drug therapy: Secondary | ICD-10-CM

## 2020-11-10 DIAGNOSIS — Z8249 Family history of ischemic heart disease and other diseases of the circulatory system: Secondary | ICD-10-CM

## 2020-11-10 DIAGNOSIS — J440 Chronic obstructive pulmonary disease with acute lower respiratory infection: Secondary | ICD-10-CM | POA: Diagnosis present

## 2020-11-10 DIAGNOSIS — D860 Sarcoidosis of lung: Secondary | ICD-10-CM | POA: Diagnosis present

## 2020-11-10 DIAGNOSIS — Z880 Allergy status to penicillin: Secondary | ICD-10-CM

## 2020-11-10 LAB — CBC
HCT: 41.1 % (ref 36.0–46.0)
Hemoglobin: 13.8 g/dL (ref 12.0–15.0)
MCH: 33.1 pg (ref 26.0–34.0)
MCHC: 33.6 g/dL (ref 30.0–36.0)
MCV: 98.6 fL (ref 80.0–100.0)
Platelets: 220 10*3/uL (ref 150–400)
RBC: 4.17 MIL/uL (ref 3.87–5.11)
RDW: 12.2 % (ref 11.5–15.5)
WBC: 5.7 10*3/uL (ref 4.0–10.5)
nRBC: 0 % (ref 0.0–0.2)

## 2020-11-10 LAB — COMPREHENSIVE METABOLIC PANEL
ALT: 31 U/L (ref 0–44)
AST: 78 U/L — ABNORMAL HIGH (ref 15–41)
Albumin: 3.8 g/dL (ref 3.5–5.0)
Alkaline Phosphatase: 82 U/L (ref 38–126)
Anion gap: 9 (ref 5–15)
BUN: 8 mg/dL (ref 6–20)
CO2: 26 mmol/L (ref 22–32)
Calcium: 8.9 mg/dL (ref 8.9–10.3)
Chloride: 102 mmol/L (ref 98–111)
Creatinine, Ser: 0.64 mg/dL (ref 0.44–1.00)
GFR, Estimated: >60 mL/min (ref >60)
Glucose, Bld: 135 mg/dL — ABNORMAL HIGH (ref 70–99)
Potassium: 3.2 mmol/L — ABNORMAL LOW (ref 3.5–5.1)
Sodium: 137 mmol/L (ref 135–145)
Total Bilirubin: 0.5 mg/dL (ref 0.3–1.2)
Total Protein: 7.6 g/dL (ref 6.5–8.1)

## 2020-11-10 LAB — LIPASE, BLOOD: Lipase: 22 U/L (ref 11–51)

## 2020-11-10 LAB — URINALYSIS, COMPLETE (UACMP) WITH MICROSCOPIC
Bilirubin Urine: NEGATIVE
Glucose, UA: NEGATIVE mg/dL
Hgb urine dipstick: NEGATIVE
Ketones, ur: NEGATIVE mg/dL
Leukocytes,Ua: NEGATIVE
Nitrite: NEGATIVE
Protein, ur: NEGATIVE mg/dL
Specific Gravity, Urine: 1.003 — ABNORMAL LOW (ref 1.005–1.030)
WBC, UA: NONE SEEN WBC/hpf (ref 0–5)
pH: 5 (ref 5.0–8.0)

## 2020-11-10 NOTE — ED Triage Notes (Signed)
Pt says that she started with abdominal pain then had SOB around 1830 today. Every time she coughs, she has pain in her left back and abdomen. Clear mucous production. 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 She is having pain in the right abdomen and says it feels like her pancreatitis.

## 2020-11-10 NOTE — ED Triage Notes (Signed)
Pt arrives via [facility] from home. Per medic report, the pt called out for SOB and pain with breathing (has known rib fractures). On arrival to scene the pt had some wheezing, and was tachypnea. She was given duo nebs, 125 solumedrol in an IV in the left [facility] with improvement. 12 lead WNL, 140/98, 98 hr, rr 22, 100% RA.

## 2020-11-11 ENCOUNTER — Emergency Department: Payer: 59

## 2020-11-11 ENCOUNTER — Other Ambulatory Visit: Payer: Self-pay

## 2020-11-11 ENCOUNTER — Encounter: Payer: Self-pay | Admitting: Radiology

## 2020-11-11 DIAGNOSIS — J9601 Acute respiratory failure with hypoxia: Secondary | ICD-10-CM | POA: Diagnosis present

## 2020-11-11 DIAGNOSIS — J9600 Acute respiratory failure, unspecified whether with hypoxia or hypercapnia: Secondary | ICD-10-CM

## 2020-11-11 DIAGNOSIS — Z951 Presence of aortocoronary bypass graft: Secondary | ICD-10-CM | POA: Diagnosis not present

## 2020-11-11 DIAGNOSIS — Z888 Allergy status to other drugs, medicaments and biological substances status: Secondary | ICD-10-CM | POA: Diagnosis not present

## 2020-11-11 DIAGNOSIS — G4733 Obstructive sleep apnea (adult) (pediatric): Secondary | ICD-10-CM | POA: Diagnosis present

## 2020-11-11 DIAGNOSIS — D860 Sarcoidosis of lung: Secondary | ICD-10-CM | POA: Diagnosis present

## 2020-11-11 DIAGNOSIS — Z833 Family history of diabetes mellitus: Secondary | ICD-10-CM | POA: Diagnosis not present

## 2020-11-11 DIAGNOSIS — I2510 Atherosclerotic heart disease of native coronary artery without angina pectoris: Secondary | ICD-10-CM | POA: Diagnosis present

## 2020-11-11 DIAGNOSIS — I119 Hypertensive heart disease without heart failure: Secondary | ICD-10-CM | POA: Diagnosis present

## 2020-11-11 DIAGNOSIS — Z716 Tobacco abuse counseling: Secondary | ICD-10-CM | POA: Diagnosis not present

## 2020-11-11 DIAGNOSIS — U071 COVID-19: Secondary | ICD-10-CM | POA: Diagnosis present

## 2020-11-11 DIAGNOSIS — K860 Alcohol-induced chronic pancreatitis: Secondary | ICD-10-CM | POA: Diagnosis present

## 2020-11-11 DIAGNOSIS — E119 Type 2 diabetes mellitus without complications: Secondary | ICD-10-CM | POA: Diagnosis present

## 2020-11-11 DIAGNOSIS — Y908 Blood alcohol level of 240 mg/100 ml or more: Secondary | ICD-10-CM | POA: Diagnosis present

## 2020-11-11 DIAGNOSIS — Z6822 Body mass index (BMI) 22.0-22.9, adult: Secondary | ICD-10-CM | POA: Diagnosis not present

## 2020-11-11 DIAGNOSIS — J440 Chronic obstructive pulmonary disease with acute lower respiratory infection: Secondary | ICD-10-CM | POA: Diagnosis present

## 2020-11-11 DIAGNOSIS — Z955 Presence of coronary angioplasty implant and graft: Secondary | ICD-10-CM | POA: Diagnosis not present

## 2020-11-11 DIAGNOSIS — G8929 Other chronic pain: Secondary | ICD-10-CM | POA: Diagnosis present

## 2020-11-11 DIAGNOSIS — E669 Obesity, unspecified: Secondary | ICD-10-CM | POA: Diagnosis present

## 2020-11-11 DIAGNOSIS — Z880 Allergy status to penicillin: Secondary | ICD-10-CM | POA: Diagnosis not present

## 2020-11-11 DIAGNOSIS — F1010 Alcohol abuse, uncomplicated: Secondary | ICD-10-CM | POA: Diagnosis present

## 2020-11-11 DIAGNOSIS — J1282 Pneumonia due to coronavirus disease 2019: Secondary | ICD-10-CM | POA: Diagnosis present

## 2020-11-11 DIAGNOSIS — Z9049 Acquired absence of other specified parts of digestive tract: Secondary | ICD-10-CM | POA: Diagnosis not present

## 2020-11-11 DIAGNOSIS — E7800 Pure hypercholesterolemia, unspecified: Secondary | ICD-10-CM | POA: Diagnosis present

## 2020-11-11 DIAGNOSIS — R54 Age-related physical debility: Secondary | ICD-10-CM | POA: Diagnosis present

## 2020-11-11 DIAGNOSIS — F17210 Nicotine dependence, cigarettes, uncomplicated: Secondary | ICD-10-CM | POA: Diagnosis present

## 2020-11-11 LAB — RESP PANEL BY RT-PCR (FLU A&B, COVID) ARPGX2
Influenza A by PCR: NEGATIVE
Influenza B by PCR: NEGATIVE
SARS Coronavirus 2 by RT PCR: POSITIVE — AB

## 2020-11-11 LAB — HEMOGLOBIN A1C
Hgb A1c MFr Bld: 6.6 % — ABNORMAL HIGH (ref 4.8–5.6)
Mean Plasma Glucose: 142.72 mg/dL

## 2020-11-11 LAB — CREATININE, SERUM
Creatinine, Ser: 0.59 mg/dL (ref 0.44–1.00)
GFR, Estimated: >60 mL/min (ref >60)

## 2020-11-11 LAB — TROPONIN I (HIGH SENSITIVITY)
Troponin I (High Sensitivity): 4 ng/L (ref <18)
Troponin I (High Sensitivity): 7544 ng/L (ref <18)
Troponin I (High Sensitivity): 3 ng/L (ref <18)
Troponin I (High Sensitivity): 3 ng/L (ref <18)

## 2020-11-11 LAB — CBG MONITORING, ED
Glucose-Capillary: 242 mg/dL — ABNORMAL HIGH (ref 70–99)
Glucose-Capillary: 253 mg/dL — ABNORMAL HIGH (ref 70–99)

## 2020-11-11 LAB — ETHANOL: Alcohol, Ethyl (B): 276 mg/dL — ABNORMAL HIGH (ref <10)

## 2020-11-11 LAB — HIV ANTIBODY (ROUTINE TESTING W REFLEX): HIV Screen 4th Generation wRfx: NONREACTIVE

## 2020-11-11 LAB — BRAIN NATRIURETIC PEPTIDE: B Natriuretic Peptide: 141.4 pg/mL — ABNORMAL HIGH (ref 0.0–100.0)

## 2020-11-11 LAB — GLUCOSE, CAPILLARY: Glucose-Capillary: 175 mg/dL — ABNORMAL HIGH (ref 70–99)

## 2020-11-11 MED ORDER — SODIUM CHLORIDE 0.9 % IV SOLN: 200.0000 mg | Freq: Once | INTRAVENOUS | Status: DC

## 2020-11-11 MED ORDER — SODIUM CHLORIDE 0.9 % IV SOLN: 100.0000 mg | Freq: Every day | INTRAVENOUS | Status: DC

## 2020-11-11 MED ORDER — CLOPIDOGREL BISULFATE 75 MG PO TABS
75.0000 mg | ORAL_TABLET | Freq: Every day | ORAL | Status: DC
  Administered 2020-11-11 – 2020-11-15 (×5): 75 mg via ORAL
  Filled 2020-11-11 (×5): qty 1

## 2020-11-11 MED ORDER — MORPHINE SULFATE (PF) 2 MG/ML IV SOLN
2.0000 mg | INTRAVENOUS | Status: DC | PRN
  Administered 2020-11-11 – 2020-11-13 (×3): 2 mg via INTRAVENOUS
  Filled 2020-11-11 (×3): qty 1

## 2020-11-11 MED ORDER — LORAZEPAM 2 MG PO TABS: 0.0000 mg | ORAL_TABLET | Freq: Four times a day (QID) | ORAL | Status: AC

## 2020-11-11 MED ORDER — HYDROCHLOROTHIAZIDE 25 MG PO TABS
25.0000 mg | ORAL_TABLET | Freq: Every day | ORAL | Status: DC
  Administered 2020-11-11 – 2020-11-14 (×4): 25 mg via ORAL
  Filled 2020-11-11 (×4): qty 1

## 2020-11-11 MED ORDER — THIAMINE HCL 100 MG PO TABS: 100.0000 mg | ORAL_TABLET | Freq: Every day | ORAL | Status: DC

## 2020-11-11 MED ORDER — NITROGLYCERIN 0.4 MG SL SUBL
0.4000 mg | SUBLINGUAL_TABLET | SUBLINGUAL | Status: AC | PRN
  Administered 2020-11-11 (×3): 0.4 mg via SUBLINGUAL
  Filled 2020-11-11: qty 1

## 2020-11-11 MED ORDER — THIAMINE HCL 100 MG/ML IJ SOLN: 100.0000 mg | Freq: Every day | INTRAMUSCULAR | Status: DC

## 2020-11-11 MED ORDER — POTASSIUM CHLORIDE CRYS ER 20 MEQ PO TBCR
40.0000 meq | EXTENDED_RELEASE_TABLET | ORAL | Status: AC
Start: 2020-11-11 — End: 2020-11-11
  Administered 2020-11-11 (×2): 40 meq via ORAL
  Filled 2020-11-11 (×2): qty 2

## 2020-11-11 MED ORDER — ALBUTEROL SULFATE HFA 108 (90 BASE) MCG/ACT IN AERS
2.0000 | INHALATION_SPRAY | Freq: Four times a day (QID) | RESPIRATORY_TRACT | Status: DC
  Administered 2020-11-11 – 2020-11-15 (×16): 2 via RESPIRATORY_TRACT
  Filled 2020-11-11: qty 6.7

## 2020-11-11 MED ORDER — FOLIC ACID 1 MG PO TABS
1.0000 mg | ORAL_TABLET | Freq: Every day | ORAL | Status: DC
  Administered 2020-11-11 – 2020-11-15 (×5): 1 mg via ORAL
  Filled 2020-11-11 (×5): qty 1

## 2020-11-11 MED ORDER — ONDANSETRON HCL 4 MG/2ML IJ SOLN
4.0000 mg | Freq: Once | INTRAMUSCULAR | Status: AC
  Administered 2020-11-11: 4 mg via INTRAVENOUS
  Filled 2020-11-11: qty 2

## 2020-11-11 MED ORDER — OXYCODONE HCL 5 MG PO TABS
5.0000 mg | ORAL_TABLET | ORAL | Status: DC | PRN
  Administered 2020-11-11 – 2020-11-15 (×18): 5 mg via ORAL
  Filled 2020-11-11 (×19): qty 1

## 2020-11-11 MED ORDER — INSULIN ASPART 100 UNIT/ML IJ SOLN
0.0000 [IU] | Freq: Every day | INTRAMUSCULAR | Status: DC
Start: 2020-11-11 — End: 2020-11-15
  Administered 2020-11-12 – 2020-11-14 (×2): 2 [IU] via SUBCUTANEOUS
  Filled 2020-11-11 (×2): qty 1

## 2020-11-11 MED ORDER — ADULT MULTIVITAMIN W/MINERALS CH
1.0000 | ORAL_TABLET | Freq: Every day | ORAL | Status: DC
  Administered 2020-11-11 – 2020-11-15 (×5): 1 via ORAL
  Filled 2020-11-11 (×5): qty 1

## 2020-11-11 MED ORDER — ONDANSETRON HCL 4 MG PO TABS
4.0000 mg | ORAL_TABLET | Freq: Four times a day (QID) | ORAL | Status: DC | PRN
  Administered 2020-11-11 – 2020-11-13 (×5): 4 mg via ORAL
  Filled 2020-11-11 (×6): qty 1

## 2020-11-11 MED ORDER — PRAVASTATIN SODIUM 20 MG PO TABS
40.0000 mg | ORAL_TABLET | Freq: Every day | ORAL | Status: DC
  Administered 2020-11-11 – 2020-11-14 (×4): 40 mg via ORAL
  Filled 2020-11-11 (×4): qty 2

## 2020-11-11 MED ORDER — LORAZEPAM 2 MG/ML IJ SOLN: 0.0000 mg | Freq: Two times a day (BID) | INTRAMUSCULAR | Status: AC

## 2020-11-11 MED ORDER — PREDNISONE 20 MG PO TABS
50.0000 mg | ORAL_TABLET | Freq: Every day | ORAL | Status: DC
  Administered 2020-11-14 – 2020-11-15 (×2): 50 mg via ORAL
  Filled 2020-11-11 (×2): qty 3

## 2020-11-11 MED ORDER — ASPIRIN 81 MG PO CHEW
324.0000 mg | CHEWABLE_TABLET | Freq: Once | ORAL | Status: AC
  Administered 2020-11-11: 324 mg via ORAL
  Filled 2020-11-11: qty 4

## 2020-11-11 MED ORDER — ONDANSETRON HCL 4 MG/2ML IJ SOLN
4.0000 mg | Freq: Four times a day (QID) | INTRAMUSCULAR | Status: DC | PRN
  Administered 2020-11-11 – 2020-11-14 (×5): 4 mg via INTRAVENOUS
  Filled 2020-11-11 (×7): qty 2

## 2020-11-11 MED ORDER — METOPROLOL TARTRATE 25 MG PO TABS
25.0000 mg | ORAL_TABLET | Freq: Two times a day (BID) | ORAL | Status: DC
  Administered 2020-11-11 – 2020-11-14 (×7): 25 mg via ORAL
  Filled 2020-11-11 (×8): qty 1

## 2020-11-11 MED ORDER — IOHEXOL 350 MG/ML SOLN
100.0000 mL | Freq: Once | INTRAVENOUS | Status: AC | PRN
  Administered 2020-11-11: 100 mL via INTRAVENOUS

## 2020-11-11 MED ORDER — LORAZEPAM 2 MG PO TABS
0.0000 mg | ORAL_TABLET | Freq: Two times a day (BID) | ORAL | Status: AC
  Administered 2020-11-13: 4 mg via ORAL
  Filled 2020-11-11: qty 2

## 2020-11-11 MED ORDER — LORAZEPAM 2 MG/ML IJ SOLN: 0.0000 mg | Freq: Four times a day (QID) | INTRAMUSCULAR | Status: AC

## 2020-11-11 MED ORDER — ENOXAPARIN SODIUM 40 MG/0.4ML IJ SOSY
40.0000 mg | PREFILLED_SYRINGE | INTRAMUSCULAR | Status: DC
  Administered 2020-11-11 – 2020-11-14 (×4): 40 mg via SUBCUTANEOUS
  Filled 2020-11-11 (×4): qty 0.4

## 2020-11-11 MED ORDER — SODIUM CHLORIDE 0.9 % IV SOLN
200.0000 mg | Freq: Once | INTRAVENOUS | Status: AC
  Administered 2020-11-11: 200 mg via INTRAVENOUS
  Filled 2020-11-11: qty 200

## 2020-11-11 MED ORDER — ACETAMINOPHEN 325 MG PO TABS
650.0000 mg | ORAL_TABLET | Freq: Four times a day (QID) | ORAL | Status: DC | PRN
  Administered 2020-11-11: 650 mg via ORAL
  Filled 2020-11-11: qty 2

## 2020-11-11 MED ORDER — MORPHINE SULFATE (PF) 4 MG/ML IV SOLN
4.0000 mg | Freq: Once | INTRAVENOUS | Status: AC
  Administered 2020-11-11: 4 mg via INTRAVENOUS
  Filled 2020-11-11: qty 1

## 2020-11-11 MED ORDER — METHYLPREDNISOLONE SODIUM SUCC 40 MG IJ SOLR
30.0000 mg | Freq: Two times a day (BID) | INTRAMUSCULAR | Status: AC
  Administered 2020-11-11 – 2020-11-13 (×6): 30 mg via INTRAVENOUS
  Filled 2020-11-11 (×6): qty 1

## 2020-11-11 MED ORDER — SODIUM CHLORIDE 0.9 % IV SOLN
100.0000 mg | Freq: Every day | INTRAVENOUS | Status: AC
Start: 2020-11-12 — End: 2020-11-15
  Administered 2020-11-12 – 2020-11-15 (×4): 100 mg via INTRAVENOUS
  Filled 2020-11-11 (×4): qty 100
  Filled 2020-11-11: qty 20

## 2020-11-11 MED ORDER — THIAMINE HCL 100 MG PO TABS
100.0000 mg | ORAL_TABLET | Freq: Every day | ORAL | Status: DC
  Administered 2020-11-11 – 2020-11-15 (×5): 100 mg via ORAL
  Filled 2020-11-11 (×5): qty 1

## 2020-11-11 MED ORDER — INSULIN ASPART 100 UNIT/ML IJ SOLN
0.0000 [IU] | Freq: Three times a day (TID) | INTRAMUSCULAR | Status: DC
  Administered 2020-11-11: 2 [IU] via SUBCUTANEOUS
  Administered 2020-11-11: 3 [IU] via SUBCUTANEOUS
  Administered 2020-11-11: 5 [IU] via SUBCUTANEOUS
  Administered 2020-11-12 (×2): 3 [IU] via SUBCUTANEOUS
  Administered 2020-11-12: 1 [IU] via SUBCUTANEOUS
  Administered 2020-11-13 – 2020-11-14 (×3): 3 [IU] via SUBCUTANEOUS
  Administered 2020-11-14: 5 [IU] via SUBCUTANEOUS
  Administered 2020-11-14: 1 [IU] via SUBCUTANEOUS
  Filled 2020-11-11 (×11): qty 1

## 2020-11-11 NOTE — Assessment & Plan Note (Signed)
-   still drinking ~ 4 glass of wine daily - patient counseled on reducing and/or stopping consumption in setting of chronic pancreatitis

## 2020-11-11 NOTE — ED Provider Notes (Signed)
[facility] Emergency Department Provider Note  ____________________________________________   Event Date/Time   First MD Initiated Contact with Patient 11/10/20 2347     (approximate)  I have reviewed the triage vital signs and the nursing notes.   HISTORY  Chief Complaint No chief complaint on file.    HPI COLIE BAKEMAN is a 55 y.o. female with history of CAD status post CABG and stent, CHF, alcohol abuse with chronic pancreatitis, hypertension, diabetes, hyperlipidemia, sarcoidosis who presents to the emergency department with complaints of feeling short of breath for the past week.  Also reports that she started having central and right-sided chest pressure and felt like she was "choking" that started yesterday.  No known fevers.  States she has had nonproductive cough.  States symptoms are worse with exertion and better with rest.  Has had nausea but no vomiting or diarrhea.  No history of PE or DVT.  States her last cardiac catheterization was 11 years ago.  She is followed by cardiologist in [place].  She has had 2 COVID-19 vaccinations.  No known sick contacts.  Reportedly was found to be hypoxic with EMS per her husband and was placed on oxygen.  Does not wear oxygen chronically.        Past Medical History:  Diagnosis Date   CHF (congestive heart failure) ([place])    Chronic pancreatitis ([place])    Coronary atherosclerosis of unspecified type of vessel, native or graft    Headache(784.0)    "weekly" (07/29/2013)   Hypertension    Leiomyoma of uterus, unspecified    Loss of weight    Obesity, unspecified    OSA (obstructive sleep apnea)    no CPAP   Pure hypercholesterolemia    Sarcoidosis    Shortness of breath    none since stent 2 yrs ago   Type II diabetes mellitus ([place])     Patient Active Problem List   Diagnosis Date Noted   Personal history of cervical dysplasia 01/15/2019   CIN III (cervical intraepithelial neoplasia grade III)  with severe dysplasia 10/23/2018   Atypical chest pain 09/11/2018   Shortness of breath 09/11/2018   CAD in native artery 09/11/2018   Hyperlipidemia LDL goal <70 09/11/2018   Acute on chronic pancreatitis ([place]) 01/20/2018   Calculus of gallbladder without cholecystitis without obstruction    Malnutrition of moderate degree 08/30/2016   SBO (small bowel obstruction) ([place]) 08/28/2016   History of coronary artery bypass graft 08/28/2016   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/28/2016   Essential hypertension 08/28/2016   Chronic pain 08/28/2016   ETOH abuse 08/28/2016   Abdominal pain    COPD (chronic obstructive pulmonary disease) ([place]) 08/13/2013   Hypoxemia 08/13/2013   Syncope 07/29/2013   Dehydration 07/29/2013   Alcohol abuse, continuous 08/11/2012   Upper abdominal pain 08/11/2012   Abnormal uterine bleeding 08/04/2012   Fibroid uterus 08/04/2012   Hypokalemia 03/24/2011   Acute pancreatitis 03/22/2011   BRONCHITIS, OBSTRUCTIVE CHRONIC 04/14/2010   SARCOIDOSIS, PULMONARY 03/24/2010   WEIGHT LOSS 03/24/2010   FIBROIDS, UTERUS 08/24/2009   ACID REFLUX DISEASE 08/03/2009   HYPERCHOLESTEROLEMIA 02/22/2009   OBESITY 12/10/2008   TOBACCO ABUSE 12/10/2008   PANCREATITIS, CHRONIC 12/10/2008   HEART DISEASE 12/02/2008   OSA (obstructive sleep apnea) 12/02/2008   Diabetes mellitus with complication ([place]) 123456   CAD 09/25/1998    Past Surgical History:  Procedure Laterality Date   BREAST CYST ASPIRATION     does not remember which  breast   BREAST EXCISIONAL BIOPSY     does not remember which breast   CORONARY ANGIOPLASTY WITH STENT PLACEMENT  09/2009   "1"   CORONARY ARTERY BYPASS GRAFT  ~ 2000   CABG X3   HYSTEROSCOPY WITH NOVASURE N/A 09/04/2012   Procedure: HYSTEROSCOPY WITH NOVASURE;  Surgeon: Mora Bellman, MD;  Location: [facility];  Service: Gynecology;  Laterality: N/A;  with removal intrauterine device   LEEP N/A 12/25/2018   Procedure: LOOP ELECTROSURGICAL  EXCISION PROCEDURE (LEEP);  Surgeon: Gae Dry, MD;  Location: [facility];  Service: Gynecology;  Laterality: N/A;   ROBOTIC ASSISTED LAPAROSCOPIC CHOLECYSTECTOMY-MULTI SITE N/A 01/14/2018   Procedure: ROBOTIC ASSISTED LAPAROSCOPIC CHOLECYSTECTOMY-MULTI SITE;  Surgeon: Jules Husbands, MD;  Location: [facility];  Service: General;  Laterality: N/A;   TUBAL LIGATION  1997    Prior to Admission medications   Medication Sig Start Date End Date Taking? Authorizing Provider  clopidogrel (PLAVIX) 75 MG tablet TAKE 1 TABLET BY MOUTH ONCE DAILY TO THIN BLOOD 06/10/20 06/10/21 Yes Ehinger, Herbie [place], MD  hydrochlorothiazide (HYDRODIURIL) 25 MG tablet TAKE 1 TABLET BY MOUTH ONCE DAILY FOR BLOOD PRESSURE 06/10/20 06/10/21 Yes Ehinger, Herbie [place], MD  HYDROcodone-acetaminophen (NORCO/VICODIN) 5-325 MG tablet TAKE 1 TABLET BY MOUTH EVERY 6 HOURS AS NEEDED FOR PAIN 06/24/20 12/21/20 Yes Gaynelle Arabian, MD  ibuprofen (ADVIL) 200 MG tablet Take 400 mg by mouth every 6 (six) hours as needed for headache or moderate pain.   Yes [provider]  losartan (COZAAR) 100 MG tablet TAKE 1 TABLET BY MOUTH ONCE DAILY FOR BLOOD PRESSURE AND DIABETIC KIDNEY PROTECTION 06/10/20 06/10/21 Yes Ehinger, Herbie [place], MD  metFORMIN (GLUCOPHAGE) 500 MG tablet TAKE 1 TABLET BY MOUTH ONCE DAILY WITH BREAKFAST 06/10/20 06/10/21 Yes Ehinger, Herbie [place], MD  metoprolol tartrate (LOPRESSOR) 100 MG tablet TAKE 1 TABLET BY MOUTH ONCE DAILY FOR BLOOD PRESSURE AND HEART 06/10/20 06/10/21 Yes Ehinger, Herbie [place], MD  nitroGLYCERIN (NITROSTAT) 0.4 MG SL tablet Place 1 tablet (0.4 mg total) under the tongue every 5 (five) minutes as needed for chest pain. Maximum of 3 doses. 09/10/18  Yes End, Harrell Gave, MD  pravastatin (PRAVACHOL) 20 MG tablet TAKE 1 TABLET BY MOUTH ONCE DAILY FOR CHOLESTEROL 06/10/20 06/10/21 Yes Ehinger, Herbie [place], MD  clopidogrel (PLAVIX) 75 MG tablet TAKE 1 TABLET BY MOUTH ONCE A DAY TO THIN BLOOD 03/07/20 03/07/21  Gaynelle Arabian, MD  clopidogrel  (PLAVIX) 75 MG tablet 1 tablet Orally Once a day to thin blood 90 days 10/18/20     COVID-19 mRNA vaccine, Pfizer, 30 MCG/0.3ML injection USE AS DIRECTED 03/11/20 03/11/21  Carlyle Basques, MD  dicyclomine (BENTYL) 20 MG tablet Take 1 tablet (20 mg total) by mouth 2 (two) times daily. Patient not taking: No sig reported 06/13/19   Palumbo, April, MD  hydrochlorothiazide (HYDRODIURIL) 25 MG tablet TAKE 1 TABLET BY MOUTH ONCE A DAY FOR BLOOD PRESSURE 03/07/20 03/07/21  Gaynelle Arabian, MD  hydrochlorothiazide (HYDRODIURIL) 25 MG tablet 1 tablet Orally once a day for blood pressure 90 days 10/18/20     HYDROcodone-acetaminophen (NORCO/VICODIN) 5-325 MG tablet TAKE 1 TABLET BY MOUTH EVERY 6 HOURS AS NEEDED FOR PAIN 05/27/20 11/23/20  Gaynelle Arabian, MD  HYDROcodone-acetaminophen (NORCO/VICODIN) 5-325 MG tablet 1 tablet Orally every 6 hrs as needed for pain 10/18/20     losartan (COZAAR) 100 MG tablet TAKE 1 TABLET BY MOUTH ONCE A DAY FOR BP AND DIABETIC KIDNEY PROTECTION 03/07/20 03/07/21  Gaynelle Arabian, MD  losartan (COZAAR) 100 MG tablet 1 tablet Orally Once  a day for BP and diabetic kidney protection 90 days 10/18/20     metFORMIN (GLUCOPHAGE) 500 MG tablet TAKE 1 TABLET BY MOUTH DAILY WITH BREAKFAST 03/07/20 03/07/21  Gaynelle Arabian, MD  metFORMIN (GLUCOPHAGE) 500 MG tablet 1 tablet with breakfast Orally Once a day 90 days 10/18/20     metoprolol tartrate (LOPRESSOR) 100 MG tablet TAKE 1 TABLET BY MOUTH ONCE DAILY FOR BLOOD PRESSURE AND HEART 03/07/20 03/07/21  Gaynelle Arabian, MD  metoprolol tartrate (LOPRESSOR) 100 MG tablet 1 tablet Orally Once a day for BP and heart 90 days 10/18/20     oxybutynin (DITROPAN-XL) 5 MG 24 hr tablet Take 1 tablet (5 mg total) by mouth at bedtime. Patient not taking: No sig reported 10/01/18   Gae Dry, MD  oxyCODONE-acetaminophen (PERCOCET/ROXICET) 5-325 MG tablet Take 1 tablet by mouth every 4 (four) hours as needed for moderate pain. Patient not taking: No sig  reported 12/25/18   Gae Dry, MD  polyethylene glycol-electrolytes (NULYTELY) 420 g solution use as directed once for colon prep Patient not taking: No sig reported 11/02/20   Wilford Corner, MD  pravastatin (PRAVACHOL) 20 MG tablet TAKE 1 TABLET BY MOUTH ONCE A DAY FOR CHOLESTEROL 03/07/20 03/07/21  Gaynelle Arabian, MD  pravastatin (PRAVACHOL) 20 MG tablet 1 tablet Orally Once a day for cholesterol 90 days 10/18/20       Allergies Penicillins and Lisinopril  Family History  Problem Relation Age of Onset   Diabetes Mother    Coronary artery disease Mother    Heart disease Mother    Hyperlipidemia Mother    Sudden death Mother    Colon cancer Maternal Grandfather    Liver disease Maternal Uncle    Heart disease Maternal Uncle    Heart disease Paternal Aunt    Stroke Maternal Grandmother    Breast cancer Neg Hx     Social History Social History   Tobacco Use   Smoking status: Every Day    Packs/day: 0.50    Years: 32.00    Pack years: 16.00    Types: Cigarettes   Smokeless tobacco: Never  Vaping Use   Vaping Use: Never used  Substance Use Topics   Alcohol use: Yes    Alcohol/week: 24.0 standard drinks    Types: 24 Glasses of wine per week    Comment: 08/28/2016 "3-4 glasses of wine qd; last glass was last night",   Drug use: No    Review of Systems Constitutional: No fever. Eyes: No visual changes. ENT: No sore throat. Cardiovascular: +chest pain. Respiratory: +shortness of breath. Gastrointestinal: No nausea, vomiting, diarrhea. Genitourinary: Negative for dysuria. Musculoskeletal: Negative for back pain. Skin: Negative for rash. Neurological: Negative for focal weakness or numbness.  ____________________________________________   PHYSICAL EXAM:  VITAL SIGNS: ED Triage Vitals  Enc Vitals Group     BP 11/10/20 2242 106/75     Pulse Rate 11/10/20 2242 99     Resp 11/10/20 2242 18     Temp 11/10/20 2242 97.6 F (36.4 C)     Temp Source 11/10/20  2242 Oral     SpO2 11/10/20 2242 95 %     Weight 11/10/20 2243 125 lb (56.7 kg)     Height 11/10/20 2243 '5\' 2"'$  (1.575 m)     Head Circumference --      Peak Flow --      Pain Score 11/10/20 2243 10     Pain Loc --      Pain Edu? --  Excl. in GC? --    CONSTITUTIONAL: Alert and oriented and responds appropriately to questions.  Thin, chronically ill-appearing HEAD: Normocephalic EYES: Conjunctivae clear, pupils appear equal, EOM appear intact ENT: normal nose; moist mucous membranes NECK: Supple, normal ROM CARD: RRR; S1 and S2 appreciated; no murmurs, no clicks, no rubs, no gallops RESP: Normal chest excursion without splinting or tachypnea; breath sounds clear and equal bilaterally; no wheezes, no rhonchi, no rales, no hypoxia when taken off of oxygen initially or respiratory distress, speaking full sentences ABD/GI: Normal bowel sounds; non-distended; soft, tender diffusely throughout the abdomen without guarding or rebound BACK: The back appears normal EXT: Normal ROM in all joints; no deformity noted, no edema; no cyanosis, no calf tenderness or calf swelling SKIN: Normal color for age and race; warm; no rash on exposed skin NEURO: Moves all extremities equally PSYCH: The patient's mood and manner are appropriate.  ____________________________________________   LABS (all labs ordered are listed, but only abnormal results are displayed)  Labs Reviewed  RESP PANEL BY RT-PCR (FLU A&B, COVID) ARPGX2 - Abnormal; Notable for the following components:      Result Value   SARS Coronavirus 2 by RT PCR POSITIVE (*)    All other components within normal limits  COMPREHENSIVE METABOLIC PANEL - Abnormal; Notable for the following components:   Potassium 3.2 (*)    Glucose, Bld 135 (*)    AST 78 (*)    All other components within normal limits  URINALYSIS, COMPLETE (UACMP) WITH MICROSCOPIC - Abnormal; Notable for the following components:   Color, Urine STRAW (*)    APPearance  CLEAR (*)    Specific Gravity, Urine 1.003 (*)    Bacteria, UA MANY (*)    All other components within normal limits  ETHANOL - Abnormal; Notable for the following components:   Alcohol, Ethyl (B) 276 (*)    All other components within normal limits  TROPONIN I (HIGH SENSITIVITY) - Abnormal; Notable for the following components:   Troponin I (High Sensitivity) 7,544 (*)    All other components within normal limits  CBC  LIPASE, BLOOD  BRAIN NATRIURETIC PEPTIDE  TROPONIN I (HIGH SENSITIVITY)  TROPONIN I (HIGH SENSITIVITY)   ____________________________________________  EKG   EKG Interpretation  Date/Time:  Thursday November 10 2020 22:49:02 EDT Ventricular Rate:  93 PR Interval:  140 QRS Duration: 76 QT Interval:  360 QTC Calculation: 447 R Axis:   124 Text Interpretation: Normal sinus rhythm Right axis deviation Cannot rule out Anterior infarct , age undetermined Abnormal ECG No significant change since last tracing Confirmed by Pryor Curia [id number]) on 11/10/2020 11:55:13 PM        ____________________________________________  RADIOLOGY Jessie Foot Dorman Calderwood, personally viewed and evaluated these images (plain radiographs) as part of my medical decision making, as well as reviewing the written report by the radiologist.  ED MD interpretation: Imaging shows signs concerning for COVID-pneumonia.  No PE.  Findings consistent with chronic pancreatitis.  Official radiology report(s): DG Chest 2 View  Result Date: 11/10/2020 CLINICAL DATA:  Chest pains EXAM: CHEST - 2 VIEW COMPARISON:  03/09/2020 FINDINGS: Cardiac shadow is mildly enlarged but stable. Postsurgical changes are again seen and stable. Lungs are well aerated bilaterally. Scarring is noted in the left base. No acute abnormality is noted. No bony abnormality is seen. IMPRESSION: Chronic changes without acute abnormality Electronically Signed   By: Inez Catalina M.D.   On: 11/10/2020 23:16   CT Angio Chest PE W and/or Wo  Contrast  Result Date: 11/11/2020 CLINICAL DATA:  Abdominal pain and shortness of breath. EXAM: CT ANGIOGRAPHY CHEST WITH CONTRAST TECHNIQUE: Multidetector CT imaging of the chest was performed using the standard protocol during bolus administration of intravenous contrast. Multiplanar CT image reconstructions and MIPs were obtained to evaluate the vascular anatomy. CONTRAST:  154m OMNIPAQUE IOHEXOL 350 MG/ML SOLN COMPARISON:  Aug 13, 2013 FINDINGS: Cardiovascular: There is moderate severity calcification of the aortic arch, without evidence of aortic aneurysm or dissection. Satisfactory opacification of the pulmonary arteries to the segmental level. No evidence of pulmonary embolism. The main pulmonary artery is dilated and measures approximately 3.5 cm. Normal heart size with marked severity coronary artery calcification. No pericardial effusion. Mediastinum/Nodes: Multiple sternal wires are present. Mild partially calcified pretracheal and bilateral hilar lymphadenopathy is seen. Thyroid gland, trachea, and esophagus demonstrate no significant findings. Lungs/Pleura: Centrilobular emphysema is again seen involving predominantly the bilateral upper lobes. Predominant stable, mild to moderate severity areas of linear scarring and/or atelectasis are seen within the bilateral upper lobes. Moderate severity atelectasis and/or infiltrate is seen within the posterior aspect of the bilateral lung bases. A predominant stable area of patchy appearing focal scarring is seen within the posterior aspect of the left lung base. There is no evidence of a pleural effusion or pneumothorax. Upper Abdomen: Numerous small parenchymal calcifications are seen scattered throughout the pancreas. Musculoskeletal: Degenerative changes seen throughout the thoracic spine. Review of the MIP images confirms the above findings. IMPRESSION: 1. No evidence of pulmonary embolism. 2. Moderate severity posterior bibasilar atelectasis and/or  infiltrate. 3. Centrilobular emphysema with mild to moderate severity bilateral upper lobe linear scarring and/or atelectasis. 4. Stable area of patchy appearing focal scarring along the posterior aspect of the lung base. 5. Findings consistent with chronic pancreatitis. Electronically Signed   By: TVirgina NorfolkM.D.   On: 11/11/2020 01:41   CT ABDOMEN PELVIS W CONTRAST  Result Date: 11/11/2020 CLINICAL DATA:  Abdominal pain. EXAM: CT ABDOMEN AND PELVIS WITH CONTRAST TECHNIQUE: Multidetector CT imaging of the abdomen and pelvis was performed using the standard protocol following bolus administration of intravenous contrast. CONTRAST:  1010mOMNIPAQUE IOHEXOL 350 MG/ML SOLN COMPARISON:  June 13, 2019 FINDINGS: Lower chest: Mild-to-moderate severe atelectasis and/or infiltrate is seen within the posterior aspect of the bilateral lung bases. Chronic patchy scarring is also seen within the left lung base. Hepatobiliary: No focal liver abnormality is seen. Status post cholecystectomy. The common bile duct measures 1.4 cm. Pancreas: Innumerable small calcifications are seen throughout the pancreatic parenchyma. Spleen: Normal in size without focal abnormality. Adrenals/Urinary Tract: Adrenal glands are unremarkable. Kidneys are normal in size without obstructing renal calculi or focal lesions. Mild to moderate severity bilateral hydronephrosis and hydroureter are seen, right greater than left. The urinary bladder is moderately distended. Stomach/Bowel: Stomach is within normal limits. Appendix appears normal. Stool is seen throughout the large bowel. Air-filled loops of otherwise normal-appearing small bowel appear to approach the upper limit of normal and are seen within the mid and lower abdomen. Vascular/Lymphatic: Aortic atherosclerosis. No enlarged abdominal or pelvic lymph nodes. Reproductive: Multiple heterogeneous uterine fibroids are present. The bilateral adnexa are unremarkable Other: No abdominal wall  hernia or abnormality. No abdominopelvic ascites. Musculoskeletal: No acute or significant osseous findings. IMPRESSION: 1. Mild to moderate severity bibasilar atelectasis and/or infiltrate with stable, chronic patchy posterior left basilar scarring. 2. Findings consistent with chronic pancreatitis. 3. Small bowel loops which appear to approach the upper limit of normal caliber within the mid and lower abdomen.  While this is likely transient in nature. Follow-up abdomen pelvis CT is recommended if early small-bowel obstruction is of clinical concern. 4. Moderately distended urinary bladder with subsequent bilateral hydronephrosis and hydroureter. 5. Multiple heterogeneous uterine fibroids. Electronically Signed   By: Virgina Norfolk M.D.   On: 11/11/2020 01:48    ____________________________________________   PROCEDURES  Procedure(s) performed (including Critical Care):  Procedures  CRITICAL CARE Performed by: Cyril Mourning Berlie Persky   Total critical care time: 50 minutes  Critical care time was exclusive of separately billable procedures and treating other patients.  Critical care was necessary to treat or prevent imminent or life-threatening deterioration.  Critical care was time spent personally by me on the following activities: development of treatment plan with patient and/or surrogate as well as nursing, discussions with consultants, evaluation of patient's response to treatment, examination of patient, obtaining history from patient or surrogate, ordering and performing treatments and interventions, ordering and review of laboratory studies, ordering and review of radiographic studies, pulse oximetry and re-evaluation of patient's condition.  ____________________________________________   INITIAL IMPRESSION / ASSESSMENT AND PLAN / ED COURSE  As part of my medical decision making, I reviewed the following data within the [employer] History obtained from family, Nursing  notes reviewed and incorporated, Labs reviewed , EKG interpreted , Old EKG reviewed, Old chart reviewed, Radiograph reviewed , Discussed with admitting physician , CTs reviewed, and Notes from prior ED visits         Patient here with complaints of chest pain, shortness of breath, cough.  Was hypoxic with EMS but on room air she is currently satting 96%.  Lungs are clear to auscultation.  EKG is nonischemic.  Initial troponin came back at greater than 7000.  Will give aspirin, nitroglycerin.  Patient also complaining of abdominal pain and some of her chest pain seems right-sided.  Will obtain CTA of the chest to evaluate for PE and also CT of the abdomen pelvis prior to starting heparin.  ED PROGRESS  Lab called and stated that patient did not have a troponin greater than 7000.  This test result actually belongs to another patient in the emergency department.  Patient's repeat troponin now is 4.  Updated patient that she is not having an NSTEMI.  CT of her chest shows no PE but does show signs concerning for possible atelectasis versus pneumonia.  Her COVID-19 test is positive.  She is not hypoxic on room air at rest with sats of 86%.  Placed back on 2 L nasal cannula and doing well.  CT also shows signs of chronic pancreatitis.  Her lipase, LFTs are within normal limits other than slightly elevated AST which is her baseline.  Alcohol level of 276 here.  Will give thiamine and place on CIWA protocol.  Will discuss with hospitalist for admission for COVID-19 pneumonia with hypoxia.   2:42 AM  Discussed patient's case with hospitalist, Dr. Damita Dunnings.  I have recommended admission and patient (and family if present) agree with this plan. Admitting physician will place admission orders.   I reviewed all nursing notes, vitals, pertinent previous records and reviewed/interpreted all EKGs, lab and urine results, imaging (as available).  ____________________________________________   FINAL CLINICAL  IMPRESSION(S) / ED DIAGNOSES  Final diagnoses:  COVID-19  Acute respiratory failure with hypoxia ([place])  Alcohol abuse  Alcohol-induced chronic pancreatitis [facility])     ED Discharge Orders     None       *Please note:  JACOBIE LOMBARDOZZI was  evaluated in Emergency Department on 11/11/2020 for the symptoms described in the history of present illness. She was evaluated in the context of the global COVID-19 pandemic, which necessitated consideration that the patient might be at risk for infection with the SARS-CoV-2 virus that causes COVID-19. Institutional protocols and algorithms that pertain to the evaluation of patients at risk for COVID-19 are in a state of rapid change based on information released by regulatory bodies including the CDC and federal and state organizations. These policies and algorithms were followed during the patient's care in the ED.  Some ED evaluations and interventions may be delayed as a result of limited staffing during and the pandemic.*   Note:  This document was prepared using Dragon voice recognition software and may include unintentional dictation errors.    Isaiha Asare, Delice Bison, DO 11/11/20 [phone]

## 2020-11-11 NOTE — Assessment & Plan Note (Signed)
-   not on treatment  ?

## 2020-11-11 NOTE — ED Notes (Signed)
Lab tech [place] calls to report redraw not needed and will make corrected report in chart; Dr Leonides Schanz notified of incorrect results

## 2020-11-11 NOTE — Hospital Course (Addendum)
Marie [name] is a 55 yo female with PMH chronic pancreatitis, chronic abd pain, ongoing etoh use, HTN, OSA, sarcoidosis, DMII, CAD s/p CABG (2002) who presented to the ER with ongoing complaints of nonproductive cough and shortness of breath.  She started feeling poorly approximately 8 to 9 days prior to admission. Due to worsening pain and shortness of breath, she presented for further work-up. In the ER she was noted to be hypoxic requiring oxygen and was treated for her lower abdominal pain consistent with chronic pancreatitis. She was found to be positive for COVID-19 and was started on remdesivir and steroids.  See below for further A&P

## 2020-11-11 NOTE — ED Notes (Signed)
Lab called to redraw blood specimens

## 2020-11-11 NOTE — Assessment & Plan Note (Addendum)
-   continue hctz and lopressor  - resume losartan

## 2020-11-11 NOTE — Assessment & Plan Note (Signed)
-   continue breathing treatments - see above

## 2020-11-11 NOTE — Progress Notes (Signed)
Inpatient Diabetes Program Recommendations  AACE/ADA: New Consensus Statement on Inpatient Glycemic Control (2015)  Target Ranges:  Prepandial:   less than 140 mg/dL      Peak postprandial:   less than 180 mg/dL (1-2 hours)      Critically ill patients:  140 - 180 mg/dL   Lab Results  Component Value Date   GLUCAP 253 (H) 11/11/2020   HGBA1C 6.6 (H) 11/11/2020    Review of Glycemic Control Results for AARNAVI, STRODE (MRN [id number]) as of 11/11/2020 12:54  Ref. Range 11/11/2020 07:46 11/11/2020 11:59  Glucose-Capillary Latest Ref Range: 70 - 99 mg/dL 242 (H) 253 (H)   Diabetes history: DM 2 Outpatient Diabetes medications: Metformin 500 mg daily Current orders for Inpatient glycemic control:  Novolog sensitive tid with meals and HS Solumedrol 30 mg q 12 hours   Inpatient Diabetes Program Recommendations:    Please consider adding Levemir 6 units bid while on steroids.   Thanks,  Adah Perl, RN, BC-ADM Inpatient Diabetes Coordinator Pager [phone]  (8a-5p)

## 2020-11-11 NOTE — H&P (Signed)
History and Physical    Marie [name] [id number] DOB: 11/15/1965 DOA: 11/10/2020  PCP: Gaynelle Arabian, MD   Patient coming from: home  I have personally briefly reviewed patient's old medical records in [place]  Chief Complaint: shortness of breath  HPI: Marie [name] is a 55 y.o. female with medical history significant for CAD s/p CABG with low risk stress test August 2020, DM, COPD, sarcoidosis not on treatment, alcohol use disorder and chronic pancreatitis who presents to the ED with a 1 week history of nonproductive cough, shortness of breath associated with retrosternal and right-sided nonradiating and nonexertional chest pressure, worse with exertion and improved with rest.  She denies nausea vomiting or diaphoresis.  Has had no abdominal pain or diarrhea.  Denies lower extremity pain or swelling.  Denies fever or chills  ED course: On arrival afebrile, BP 106/75, pulse 99 and O2 sat 95% on room air, desaturating to as low as 86% requiring 2 L O2 to maintain sats in the mid to high 90s. Blood work unremarkable except for potassium of 3.2, blood alcohol level of 276.  Troponin 4.(Erroneous troponin level of 7500-verified with lab).  COVID-positive  EKG, personally viewed and interpreted: Sinus at 93 with no acute ST-T wave changes  Imaging: CTA chest: No evidence of PE centrilobular emphysema focal scarring posterior aspect of lung base, findings consistent with chronic pancreatitis CT abdomen and pelvis moderately distended urinary bladder with subsequent bilateral hydronephrosis and hydroureter, chronic pancreatitis, chronic patchy scarring of the lungs  Patient started on treatment for COVID-pneumonia.  Hospitalist consulted for admission.   Review of Systems: As per HPI otherwise all other systems on review of systems negative.    Past Medical History:  Diagnosis Date   CHF (congestive heart failure) (HCC)    Chronic pancreatitis (HCC)    Coronary  atherosclerosis of unspecified type of vessel, native or graft    Headache(784.0)    "weekly" (07/29/2013)   Hypertension    Leiomyoma of uterus, unspecified    Loss of weight    Obesity, unspecified    OSA (obstructive sleep apnea)    no CPAP   Pure hypercholesterolemia    Sarcoidosis    Shortness of breath    none since stent 2 yrs ago   Type II diabetes mellitus ([place])     Past Surgical History:  Procedure Laterality Date   BREAST CYST ASPIRATION     does not remember which breast   BREAST EXCISIONAL BIOPSY     does not remember which breast   CORONARY ANGIOPLASTY WITH STENT PLACEMENT  09/2009   "1"   CORONARY ARTERY BYPASS GRAFT  ~ 2000   CABG X3   HYSTEROSCOPY WITH NOVASURE N/A 09/04/2012   Procedure: HYSTEROSCOPY WITH NOVASURE;  Surgeon: Mora Bellman, MD;  Location: [facility];  Service: Gynecology;  Laterality: N/A;  with removal intrauterine device   LEEP N/A 12/25/2018   Procedure: LOOP ELECTROSURGICAL EXCISION PROCEDURE (LEEP);  Surgeon: Gae Dry, MD;  Location: [facility];  Service: Gynecology;  Laterality: N/A;   ROBOTIC ASSISTED LAPAROSCOPIC CHOLECYSTECTOMY-MULTI SITE N/A 01/14/2018   Procedure: ROBOTIC ASSISTED LAPAROSCOPIC CHOLECYSTECTOMY-MULTI SITE;  Surgeon: Jules Husbands, MD;  Location: [facility];  Service: General;  Laterality: N/A;   TUBAL LIGATION  1997     reports that she has been smoking cigarettes. She has a 16.00 pack-year smoking history. She has never used smokeless tobacco. She reports current alcohol use of about 24.0 standard drinks per week. She  reports that she does not use drugs.  Allergies  Allergen Reactions   Penicillins Other (See Comments)    Unknown    Lisinopril Hives    Family History  Problem Relation Age of Onset   Diabetes Mother    Coronary artery disease Mother    Heart disease Mother    Hyperlipidemia Mother    Sudden death Mother    Colon cancer Maternal Grandfather    Liver disease Maternal Uncle    Heart disease  Maternal Uncle    Heart disease Paternal Aunt    Stroke Maternal Grandmother    Breast cancer Neg Hx       Prior to Admission medications   Medication Sig Start Date End Date Taking? Authorizing Provider  clopidogrel (PLAVIX) 75 MG tablet TAKE 1 TABLET BY MOUTH ONCE DAILY TO THIN BLOOD 06/10/20 06/10/21 Yes Ehinger, Herbie [place], MD  hydrochlorothiazide (HYDRODIURIL) 25 MG tablet TAKE 1 TABLET BY MOUTH ONCE DAILY FOR BLOOD PRESSURE 06/10/20 06/10/21 Yes Ehinger, Herbie [place], MD  HYDROcodone-acetaminophen (NORCO/VICODIN) 5-325 MG tablet TAKE 1 TABLET BY MOUTH EVERY 6 HOURS AS NEEDED FOR PAIN 06/24/20 12/21/20 Yes Gaynelle Arabian, MD  ibuprofen (ADVIL) 200 MG tablet Take 400 mg by mouth every 6 (six) hours as needed for headache or moderate pain.   Yes [provider]  losartan (COZAAR) 100 MG tablet TAKE 1 TABLET BY MOUTH ONCE DAILY FOR BLOOD PRESSURE AND DIABETIC KIDNEY PROTECTION 06/10/20 06/10/21 Yes Ehinger, Herbie [place], MD  metFORMIN (GLUCOPHAGE) 500 MG tablet TAKE 1 TABLET BY MOUTH ONCE DAILY WITH BREAKFAST 06/10/20 06/10/21 Yes Ehinger, Herbie [place], MD  metoprolol tartrate (LOPRESSOR) 100 MG tablet TAKE 1 TABLET BY MOUTH ONCE DAILY FOR BLOOD PRESSURE AND HEART 06/10/20 06/10/21 Yes Ehinger, Herbie [place], MD  nitroGLYCERIN (NITROSTAT) 0.4 MG SL tablet Place 1 tablet (0.4 mg total) under the tongue every 5 (five) minutes as needed for chest pain. Maximum of 3 doses. 09/10/18  Yes End, Harrell Gave, MD  pravastatin (PRAVACHOL) 20 MG tablet TAKE 1 TABLET BY MOUTH ONCE DAILY FOR CHOLESTEROL 06/10/20 06/10/21 Yes Ehinger, Herbie [place], MD  clopidogrel (PLAVIX) 75 MG tablet TAKE 1 TABLET BY MOUTH ONCE A DAY TO THIN BLOOD 03/07/20 03/07/21  Gaynelle Arabian, MD  clopidogrel (PLAVIX) 75 MG tablet 1 tablet Orally Once a day to thin blood 90 days 10/18/20     COVID-19 mRNA vaccine, Pfizer, 30 MCG/0.3ML injection USE AS DIRECTED 03/11/20 03/11/21  Carlyle Basques, MD  dicyclomine (BENTYL) 20 MG tablet Take 1 tablet (20 mg total) by mouth 2  (two) times daily. Patient not taking: No sig reported 06/13/19   Palumbo, April, MD  hydrochlorothiazide (HYDRODIURIL) 25 MG tablet TAKE 1 TABLET BY MOUTH ONCE A DAY FOR BLOOD PRESSURE 03/07/20 03/07/21  Gaynelle Arabian, MD  hydrochlorothiazide (HYDRODIURIL) 25 MG tablet 1 tablet Orally once a day for blood pressure 90 days 10/18/20     HYDROcodone-acetaminophen (NORCO/VICODIN) 5-325 MG tablet TAKE 1 TABLET BY MOUTH EVERY 6 HOURS AS NEEDED FOR PAIN 05/27/20 11/23/20  Gaynelle Arabian, MD  HYDROcodone-acetaminophen (NORCO/VICODIN) 5-325 MG tablet 1 tablet Orally every 6 hrs as needed for pain 10/18/20     losartan (COZAAR) 100 MG tablet TAKE 1 TABLET BY MOUTH ONCE A DAY FOR BP AND DIABETIC KIDNEY PROTECTION 03/07/20 03/07/21  Gaynelle Arabian, MD  losartan (COZAAR) 100 MG tablet 1 tablet Orally Once a day for BP and diabetic kidney protection 90 days 10/18/20     metFORMIN (GLUCOPHAGE) 500 MG tablet TAKE 1 TABLET BY MOUTH DAILY WITH BREAKFAST 03/07/20 03/07/21  Gaynelle Arabian, MD  metFORMIN (GLUCOPHAGE) 500 MG tablet 1 tablet with breakfast Orally Once a day 90 days 10/18/20     metoprolol tartrate (LOPRESSOR) 100 MG tablet TAKE 1 TABLET BY MOUTH ONCE DAILY FOR BLOOD PRESSURE AND HEART 03/07/20 03/07/21  Gaynelle Arabian, MD  metoprolol tartrate (LOPRESSOR) 100 MG tablet 1 tablet Orally Once a day for BP and heart 90 days 10/18/20     oxybutynin (DITROPAN-XL) 5 MG 24 hr tablet Take 1 tablet (5 mg total) by mouth at bedtime. Patient not taking: No sig reported 10/01/18   Gae Dry, MD  oxyCODONE-acetaminophen (PERCOCET/ROXICET) 5-325 MG tablet Take 1 tablet by mouth every 4 (four) hours as needed for moderate pain. Patient not taking: No sig reported 12/25/18   Gae Dry, MD  polyethylene glycol-electrolytes (NULYTELY) 420 g solution use as directed once for colon prep Patient not taking: No sig reported 11/02/20   Wilford Corner, MD  pravastatin (PRAVACHOL) 20 MG tablet TAKE 1 TABLET BY MOUTH  ONCE A DAY FOR CHOLESTEROL 03/07/20 03/07/21  Gaynelle Arabian, MD  pravastatin (PRAVACHOL) 20 MG tablet 1 tablet Orally Once a day for cholesterol 90 days 10/18/20       Physical Exam: Vitals:   11/11/20 0145 11/11/20 0200 11/11/20 0220 11/11/20 0230  BP: 90/65 107/72 101/72 109/74  Pulse: (!) 109 (!) 104 100 (!) 101  Resp: '20 19  19  '$ Temp:      TempSrc:      SpO2: 98% 98% 96% 97%  Weight:      Height:         Vitals:   11/11/20 0145 11/11/20 0200 11/11/20 0220 11/11/20 0230  BP: 90/65 107/72 101/72 109/74  Pulse: (!) 109 (!) 104 100 (!) 101  Resp: '20 19  19  '$ Temp:      TempSrc:      SpO2: 98% 98% 96% 97%  Weight:      Height:          Constitutional: Frail, cachectic appearing ,alert and oriented x 3 . Not in any apparent distress HEENT:      Head: Normocephalic and atraumatic.         Eyes: PERLA, EOMI, Conjunctivae are normal. Sclera is non-icteric.       Mouth/Throat: Mucous membranes are moist.       Neck: Supple with no signs of meningismus. Cardiovascular: Regular rate and rhythm. No murmurs, gallops, or rubs. 2+ symmetrical distal pulses are present . No JVD. No LE edema Respiratory: Respiratory effort increased.Lungs sounds diminished bilaterally.  Coarse breath sounds in bases gastrointestinal: Soft, non tender, and non distended with positive bowel sounds.  Genitourinary: No CVA tenderness. Musculoskeletal: Nontender with normal range of motion in all extremities. No cyanosis, or erythema of extremities. Neurologic:  Face is symmetric. Moving all extremities. No gross focal neurologic deficits . Skin: Skin is warm, dry.  No rash or ulcers Psychiatric: Mood and affect are normal    Labs on Admission: I have personally reviewed following labs and imaging studies  CBC: Recent Labs  Lab 11/10/20 2251  WBC 5.7  HGB 13.8  HCT 41.1  MCV 98.6  PLT [id number]   Basic Metabolic Panel: Recent Labs  Lab 11/10/20 2251  NA 137  K 3.2*  CL 102  CO2 26  GLUCOSE  135*  BUN 8  CREATININE 0.64  CALCIUM 8.9   GFR: Estimated Creatinine Clearance: 63.6 mL/min (by C-G formula based on SCr of 0.64 mg/dL). Liver Function Tests: Recent  Labs  Lab 11/10/20 2251  AST 78*  ALT 31  ALKPHOS 82  BILITOT 0.5  PROT 7.6  ALBUMIN 3.8   Recent Labs  Lab 11/10/20 2251  LIPASE 22   No results for input(s): AMMONIA in the last 168 hours. Coagulation Profile: No results for input(s): INR, PROTIME in the last 168 hours. Cardiac Enzymes: No results for input(s): CKTOTAL, CKMB, CKMBINDEX, TROPONINI in the last 168 hours. BNP (last 3 results) No results for input(s): PROBNP in the last 8760 hours. HbA1C: No results for input(s): HGBA1C in the last 72 hours. CBG: No results for input(s): GLUCAP in the last 168 hours. Lipid Profile: No results for input(s): CHOL, HDL, LDLCALC, TRIG, CHOLHDL, LDLDIRECT in the last 72 hours. Thyroid Function Tests: No results for input(s): TSH, T4TOTAL, FREET4, T3FREE, THYROIDAB in the last 72 hours. Anemia Panel: No results for input(s): VITAMINB12, FOLATE, FERRITIN, TIBC, IRON, RETICCTPCT in the last 72 hours. Urine analysis:    Component Value Date/Time   COLORURINE STRAW (A) 11/10/2020 2251   APPEARANCEUR CLEAR (A) 11/10/2020 2251   LABSPEC 1.003 (L) 11/10/2020 2251   PHURINE 5.0 11/10/2020 2251   GLUCOSEU NEGATIVE 11/10/2020 2251   HGBUR NEGATIVE 11/10/2020 2251   HGBUR negative 03/16/2010 1107   BILIRUBINUR NEGATIVE 11/10/2020 2251   KETONESUR NEGATIVE 11/10/2020 2251   PROTEINUR NEGATIVE 11/10/2020 2251   UROBILINOGEN 1.0 10/12/2015 1910   NITRITE NEGATIVE 11/10/2020 2251   LEUKOCYTESUR NEGATIVE 11/10/2020 2251    Radiological Exams on Admission: DG Chest 2 View  Result Date: 11/10/2020 CLINICAL DATA:  Chest pains EXAM: CHEST - 2 VIEW COMPARISON:  03/09/2020 FINDINGS: Cardiac shadow is mildly enlarged but stable. Postsurgical changes are again seen and stable. Lungs are well aerated bilaterally. Scarring is  noted in the left base. No acute abnormality is noted. No bony abnormality is seen. IMPRESSION: Chronic changes without acute abnormality Electronically Signed   By: Inez Catalina M.D.   On: 11/10/2020 23:16   CT Angio Chest PE W and/or Wo Contrast  Result Date: 11/11/2020 CLINICAL DATA:  Abdominal pain and shortness of breath. EXAM: CT ANGIOGRAPHY CHEST WITH CONTRAST TECHNIQUE: Multidetector CT imaging of the chest was performed using the standard protocol during bolus administration of intravenous contrast. Multiplanar CT image reconstructions and MIPs were obtained to evaluate the vascular anatomy. CONTRAST:  164m OMNIPAQUE IOHEXOL 350 MG/ML SOLN COMPARISON:  Aug 13, 2013 FINDINGS: Cardiovascular: There is moderate severity calcification of the aortic arch, without evidence of aortic aneurysm or dissection. Satisfactory opacification of the pulmonary arteries to the segmental level. No evidence of pulmonary embolism. The main pulmonary artery is dilated and measures approximately 3.5 cm. Normal heart size with marked severity coronary artery calcification. No pericardial effusion. Mediastinum/Nodes: Multiple sternal wires are present. Mild partially calcified pretracheal and bilateral hilar lymphadenopathy is seen. Thyroid gland, trachea, and esophagus demonstrate no significant findings. Lungs/Pleura: Centrilobular emphysema is again seen involving predominantly the bilateral upper lobes. Predominant stable, mild to moderate severity areas of linear scarring and/or atelectasis are seen within the bilateral upper lobes. Moderate severity atelectasis and/or infiltrate is seen within the posterior aspect of the bilateral lung bases. A predominant stable area of patchy appearing focal scarring is seen within the posterior aspect of the left lung base. There is no evidence of a pleural effusion or pneumothorax. Upper Abdomen: Numerous small parenchymal calcifications are seen scattered throughout the pancreas.  Musculoskeletal: Degenerative changes seen throughout the thoracic spine. Review of the MIP images confirms the above findings. IMPRESSION: 1.  No evidence of pulmonary embolism. 2. Moderate severity posterior bibasilar atelectasis and/or infiltrate. 3. Centrilobular emphysema with mild to moderate severity bilateral upper lobe linear scarring and/or atelectasis. 4. Stable area of patchy appearing focal scarring along the posterior aspect of the lung base. 5. Findings consistent with chronic pancreatitis. Electronically Signed   By: Virgina Norfolk M.D.   On: 11/11/2020 01:41   CT ABDOMEN PELVIS W CONTRAST  Result Date: 11/11/2020 CLINICAL DATA:  Abdominal pain. EXAM: CT ABDOMEN AND PELVIS WITH CONTRAST TECHNIQUE: Multidetector CT imaging of the abdomen and pelvis was performed using the standard protocol following bolus administration of intravenous contrast. CONTRAST:  113m OMNIPAQUE IOHEXOL 350 MG/ML SOLN COMPARISON:  June 13, 2019 FINDINGS: Lower chest: Mild-to-moderate severe atelectasis and/or infiltrate is seen within the posterior aspect of the bilateral lung bases. Chronic patchy scarring is also seen within the left lung base. Hepatobiliary: No focal liver abnormality is seen. Status post cholecystectomy. The common bile duct measures 1.4 cm. Pancreas: Innumerable small calcifications are seen throughout the pancreatic parenchyma. Spleen: Normal in size without focal abnormality. Adrenals/Urinary Tract: Adrenal glands are unremarkable. Kidneys are normal in size without obstructing renal calculi or focal lesions. Mild to moderate severity bilateral hydronephrosis and hydroureter are seen, right greater than left. The urinary bladder is moderately distended. Stomach/Bowel: Stomach is within normal limits. Appendix appears normal. Stool is seen throughout the large bowel. Air-filled loops of otherwise normal-appearing small bowel appear to approach the upper limit of normal and are seen within the mid  and lower abdomen. Vascular/Lymphatic: Aortic atherosclerosis. No enlarged abdominal or pelvic lymph nodes. Reproductive: Multiple heterogeneous uterine fibroids are present. The bilateral adnexa are unremarkable Other: No abdominal wall hernia or abnormality. No abdominopelvic ascites. Musculoskeletal: No acute or significant osseous findings. IMPRESSION: 1. Mild to moderate severity bibasilar atelectasis and/or infiltrate with stable, chronic patchy posterior left basilar scarring. 2. Findings consistent with chronic pancreatitis. 3. Small bowel loops which appear to approach the upper limit of normal caliber within the mid and lower abdomen. While this is likely transient in nature. Follow-up abdomen pelvis CT is recommended if early small-bowel obstruction is of clinical concern. 4. Moderately distended urinary bladder with subsequent bilateral hydronephrosis and hydroureter. 5. Multiple heterogeneous uterine fibroids. Electronically Signed   By: TVirgina NorfolkM.D.   On: 11/11/2020 01:48     Assessment/Plan 55year old female with history of CAD s/p CABG with low risk stress test August 2020, DM, sarcoidosis not on treatment, COPD, alcohol use disorder and chronic pancreatitis presenting with a 1 week history of cough, shortness of breath and chest pressure.    Pneumonia due to COVID-19 virus   Acute respiratory failure due to COVID-19 ([facility] - Patient with shortness of breath, cough, hypoxic to 86 on room air with exertion in the ED, chest tightness, COVID-positive - CTA chest negative for PE.  Showing emphysema, scarring - Remdesivir, methylprednisolone, albuterol, antitussives and vitamins - Supplemental O2 to keep sats over 92% - Airborne precautions  Chronic pancreatitis - No acute issues suspected    COPD (chronic obstructive pulmonary disease) (HCC) Sarcoidosis - Albuterol as needed - Patient states she was told she had sarcoidosis a few years prior but states she never started  treatment    History of coronary artery bypass graft   CAD in native artery - Continue aspirin, beta-blocker and statin - Troponin negative(erroneous troponin of over 7000, verified with lab) - EKG nonacute - History of low risk exercise stress test August 2020  Diabetes mellitus type  2 - Sliding scale insulin coverage  Alcohol use disorder - EtOH level 276 - CIWA withdrawal protocol    DVT prophylaxis: Lovenox  Code Status: full code  Family Communication:  none  Disposition Plan: Back to previous home environment Consults called: none  Status:At the time of admission, it appears that the appropriate admission status for this patient is INPATIENT. This is judged to be reasonable and necessary in order to provide the required intensity of service to ensure the patient's safety given the presenting symptoms, physical exam findings, and initial radiographic and laboratory data in the context of their  Comorbid conditions.   Patient requires inpatient status due to high intensity of service, high risk for further deterioration and high frequency of surveillance required.   I certify that at the point of admission it is my clinical judgment that the patient will require inpatient hospital care spanning beyond [facility] MD [facility]     11/11/2020, 3:22 AM

## 2020-11-11 NOTE — ED Notes (Signed)
Pt provided breakfast tray.

## 2020-11-11 NOTE — Consult Note (Signed)
Remdesivir - Pharmacy Brief Note   O:  ALT: 31 CTA: "Moderate severity posterior bibasilar atelectasis and/or infiltrate" SpO2: Hypoxic requiring supplemental oxygen   A/P:  11/11/20 SARS-CoV-2 PCR (+)  Remdesivir 200 mg IVPB once followed by 100 mg IVPB daily x 4 days.   Benita Gutter  11/11/2020 2:31 AM

## 2020-11-11 NOTE — Assessment & Plan Note (Addendum)
-   not on home O2 - wean as able; able to be weaned to room air on 11/13/2020 - etiology multifactorial from covid but also emphysema noted on CTA chest from hx tobacco use

## 2020-11-11 NOTE — ED Notes (Signed)
Pt provided diet ginger ale and apple juice.

## 2020-11-11 NOTE — Assessment & Plan Note (Signed)
-   no acute changes; abd pain is chronic and at baseline - continue pain control

## 2020-11-11 NOTE — Assessment & Plan Note (Addendum)
-   s/p CABG in 2002 per careeverywhere  - continue home meds

## 2020-11-11 NOTE — Assessment & Plan Note (Signed)
-   cessation counseling also given on admission

## 2020-11-11 NOTE — ED Notes (Signed)
Lab tech Whitakers reports 2nd trop results 4, would like redraw to verify results as compared to 1st trop

## 2020-11-11 NOTE — Assessment & Plan Note (Addendum)
-   approx day 8 of symptoms on admission -Completed 5 days remdesivir on 11/15/2020 - Discharged with steroids to complete 10-day course

## 2020-11-11 NOTE — ED Notes (Signed)
Rn aware bed assigned 

## 2020-11-11 NOTE — Progress Notes (Signed)
Progress Note    Marie [name]   [id number]  DOB: 15-Feb-1966  DOA: 11/10/2020     0  PCP: Gaynelle Arabian, MD  Initial CC: SOB, dyspnea   Hospital Course: Ms. Hefel is a 55 yo female with PMH chronic pancreatitis, chronic abd pain, ongoing etoh use, HTN, OSA, sarcoidosis, DMII, CAD s/p CABG (2002) who presented to the ER with ongoing complaints of nonproductive cough and shortness of breath.  She started feeling poorly approximately 8 to 9 days prior to admission. Due to worsening pain and shortness of breath, she presented for further work-up. In the ER she was noted to be hypoxic requiring oxygen and was treated for her lower abdominal pain consistent with chronic pancreatitis. She was found to be positive for COVID-19 and was started on remdesivir and steroids.  Interval History:  Seen in the ER this morning.  Husband bedside as well.  Abdominal pain is chronic and at baseline she reports.  Has had her symptoms slightly over a week but was feeling worse so finally came to the hospital.  ROS: Constitutional: negative for chills and fevers, Respiratory: positive for cough and dyspnea on exertion, negative for wheezing, Cardiovascular: negative for chest pain, and Gastrointestinal: negative for abdominal pain  Assessment & Plan: * Pneumonia due to COVID-19 virus - approx day 8 of symptoms on admission - continue remdesivir and steroids - trend inflammatory markers - encourage incentive spirometer and flutter  Acute respiratory failure due to COVID-19 ([place]) - not on home O2 - wean as able - etiology multifactorial from covid but also emphysema noted on CTA chest from hx tobacco use  CAD in native artery - s/p CABG in 2002 per careeverywhere  - follow up med rec and resume home meds  ETOH abuse - still drinking ~ 4 glass of wine daily - patient counseled on reducing and/or stopping consumption in setting of chronic pancreatitis   Essential hypertension - continue  hctz and lopressor  - follow up med rec  COPD (chronic obstructive pulmonary disease) ([place]) - continue breathing treatments - see above   Pulmonary sarcoidosis ([place]) - not on treatment   OSA (obstructive sleep apnea) - will clarify if uses home CPAP  Chronic pancreatitis ([place]) - no acute changes; abd pain is chronic and at baseline - continue pain control   Tobacco use - cessation counseling also given on admission   Diabetes mellitus with complication ([place]) - continue SSI and CBG monitoring     Old records reviewed in assessment of this patient  Antimicrobials: Remdesivir 11/11/2020 >> current  DVT prophylaxis: enoxaparin (LOVENOX) injection 40 mg Start: 11/11/20 2200   Code Status:   Code Status: Full Code Family Communication: Husband  Disposition Plan: Status is: Inpatient  Remains inpatient appropriate because:IV treatments appropriate due to intensity of illness or inability to take PO and Inpatient level of care appropriate due to severity of illness  Dispo: The patient is from: Home              Anticipated d/c is to: Home              Patient currently is not medically stable to d/c.   Difficult to place patient No  Risk of unplanned readmission score: Unplanned Admission- Pilot do not use: 11.28   Objective: Blood pressure 108/72, pulse 75, temperature 97.6 F (36.4 C), temperature source Oral, resp. rate 18, height '5\' 2"'$  (1.575 m), weight 56.7 kg, SpO2 99 %.  Examination: General appearance: alert, cooperative,  and no distress Head: Normocephalic, without obvious abnormality, atraumatic Eyes:  EOMI Lungs:  Coarse breath sounds bilaterally, no wheezing Heart: regular rate and rhythm and S1, S2 normal Abdomen:  Tenderness to palpation throughout, no rebound or guarding.  Bowel sounds present Extremities:  No edema Skin: mobility and turgor normal Neurologic: Grossly normal  Consultants:    Procedures:    Data Reviewed: I have personally  reviewed following labs and imaging studies Results for orders placed or performed during the hospital encounter of 11/10/20 (from the past 24 hour(s))  CBC     Status: None   Collection Time: 11/10/20 10:51 PM  Result Value Ref Range   WBC 5.7 4.0 - 10.5 K/uL   RBC 4.17 3.87 - 5.11 MIL/uL   Hemoglobin 13.8 12.0 - 15.0 g/dL   HCT 41.1 36.0 - 46.0 %   MCV 98.6 80.0 - 100.0 fL   MCH 33.1 26.0 - 34.0 pg   MCHC 33.6 30.0 - 36.0 g/dL   RDW 12.2 11.5 - 15.5 %   Platelets 220 150 - 400 K/uL   nRBC 0.0 0.0 - 0.2 %  Troponin I (High Sensitivity)     Status: Abnormal   Collection Time: 11/10/20 10:51 PM  Result Value Ref Range   Troponin I (High Sensitivity) 7,544 (HH) <18 ng/L  Lipase, blood     Status: None   Collection Time: 11/10/20 10:51 PM  Result Value Ref Range   Lipase 22 11 - 51 U/L  Comprehensive metabolic panel     Status: Abnormal   Collection Time: 11/10/20 10:51 PM  Result Value Ref Range   Sodium 137 135 - 145 mmol/L   Potassium 3.2 (L) 3.5 - 5.1 mmol/L   Chloride 102 98 - 111 mmol/L   CO2 26 22 - 32 mmol/L   Glucose, Bld 135 (H) 70 - 99 mg/dL   BUN 8 6 - 20 mg/dL   Creatinine, Ser 0.64 0.44 - 1.00 mg/dL   Calcium 8.9 8.9 - 10.3 mg/dL   Total Protein 7.6 6.5 - 8.1 g/dL   Albumin 3.8 3.5 - 5.0 g/dL   AST 78 (H) 15 - 41 U/L   ALT 31 0 - 44 U/L   Alkaline Phosphatase 82 38 - 126 U/L   Total Bilirubin 0.5 0.3 - 1.2 mg/dL   GFR, Estimated >60 >60 mL/min   Anion gap 9 5 - 15  Urinalysis, Complete w Microscopic     Status: Abnormal   Collection Time: 11/10/20 10:51 PM  Result Value Ref Range   Color, Urine STRAW (A) YELLOW   APPearance CLEAR (A) CLEAR   Specific Gravity, Urine 1.003 (L) 1.005 - 1.030   pH 5.0 5.0 - 8.0   Glucose, UA NEGATIVE NEGATIVE mg/dL   Hgb urine dipstick NEGATIVE NEGATIVE   Bilirubin Urine NEGATIVE NEGATIVE   Ketones, ur NEGATIVE NEGATIVE mg/dL   Protein, ur NEGATIVE NEGATIVE mg/dL   Nitrite NEGATIVE NEGATIVE   Leukocytes,Ua NEGATIVE  NEGATIVE   WBC, UA NONE SEEN 0 - 5 WBC/hpf   Bacteria, UA MANY (A) NONE SEEN   Squamous Epithelial / LPF 0-5 0 - 5  Ethanol     Status: Abnormal   Collection Time: 11/11/20 12:08 AM  Result Value Ref Range   Alcohol, Ethyl (B) 276 (H) <10 mg/dL  Resp Panel by RT-PCR (Flu A&B, Covid) Nasopharyngeal Swab     Status: Abnormal   Collection Time: 11/11/20 12:08 AM   Specimen: Nasopharyngeal Swab; Nasopharyngeal(NP) swabs in vial transport  medium  Result Value Ref Range   SARS Coronavirus 2 by RT PCR POSITIVE (A) NEGATIVE   Influenza A by PCR NEGATIVE NEGATIVE   Influenza B by PCR NEGATIVE NEGATIVE  Troponin I (High Sensitivity)     Status: None   Collection Time: 11/11/20 12:08 AM  Result Value Ref Range   Troponin I (High Sensitivity) 4 <18 ng/L  Brain natriuretic peptide     Status: Abnormal   Collection Time: 11/11/20  2:54 AM  Result Value Ref Range   B Natriuretic Peptide 141.4 (H) 0.0 - 100.0 pg/mL  Troponin I (High Sensitivity)     Status: None   Collection Time: 11/11/20  2:54 AM  Result Value Ref Range   Troponin I (High Sensitivity) 3 <18 ng/L  Hemoglobin A1c     Status: Abnormal   Collection Time: 11/11/20  4:03 AM  Result Value Ref Range   Hgb A1c MFr Bld 6.6 (H) 4.8 - 5.6 %   Mean Plasma Glucose 142.72 mg/dL  HIV Antibody (routine testing w rflx)     Status: None   Collection Time: 11/11/20  4:03 AM  Result Value Ref Range   HIV Screen 4th Generation wRfx Non Reactive Non Reactive  Creatinine, serum     Status: None   Collection Time: 11/11/20  4:03 AM  Result Value Ref Range   Creatinine, Ser 0.59 0.44 - 1.00 mg/dL   GFR, Estimated >60 >60 mL/min  Troponin I (High Sensitivity)     Status: None   Collection Time: 11/11/20  4:03 AM  Result Value Ref Range   Troponin I (High Sensitivity) 3 <18 ng/L  CBG monitoring, ED     Status: Abnormal   Collection Time: 11/11/20  7:46 AM  Result Value Ref Range   Glucose-Capillary 242 (H) 70 - 99 mg/dL  CBG monitoring, ED      Status: Abnormal   Collection Time: 11/11/20 11:59 AM  Result Value Ref Range   Glucose-Capillary 253 (H) 70 - 99 mg/dL    Recent Results (from the past 240 hour(s))  Resp Panel by RT-PCR (Flu A&B, Covid) Nasopharyngeal Swab     Status: Abnormal   Collection Time: 11/11/20 12:08 AM   Specimen: Nasopharyngeal Swab; Nasopharyngeal(NP) swabs in vial transport medium  Result Value Ref Range Status   SARS Coronavirus 2 by RT PCR POSITIVE (A) NEGATIVE Final    Comment: RESULT CALLED TO, READ BACK BY AND VERIFIED WITH: TIA BULLOCK RN [id number] 11/10/20 HNM (NOTE) SARS-CoV-2 target nucleic acids are DETECTED.  The SARS-CoV-2 RNA is generally detectable in upper respiratory specimens during the acute phase of infection. Positive results are indicative of the presence of the identified virus, but do not rule out bacterial infection or co-infection with other pathogens not detected by the test. Clinical correlation with patient history and other diagnostic information is necessary to determine patient infection status. The expected result is Negative.  Fact Sheet for Patients: EntrepreneurPulse.com.au  Fact Sheet for Healthcare Providers: IncredibleEmployment.be  This test is not yet approved or cleared by the [place] FDA and  has been authorized for detection and/or diagnosis of SARS-CoV-2 by FDA under an Emergency Use Authorization (EUA).  This EUA will remain in effect (meaning this test can be u sed) for the duration of  the COVID-19 declaration under Section 564(b)(1) of the Act, 21 U.S.C. section 360bbb-3(b)(1), unless the authorization is terminated or revoked sooner.     Influenza A by PCR NEGATIVE NEGATIVE Final   Influenza B  by PCR NEGATIVE NEGATIVE Final    Comment: (NOTE) The Xpert Xpress SARS-CoV-2/FLU/RSV plus assay is intended as an aid in the diagnosis of influenza from Nasopharyngeal swab specimens and should not be used as a sole  basis for treatment. Nasal washings and aspirates are unacceptable for Xpert Xpress SARS-CoV-2/FLU/RSV testing.  Fact Sheet for Patients: EntrepreneurPulse.com.au  Fact Sheet for Healthcare Providers: IncredibleEmployment.be  This test is not yet approved or cleared by the [place] FDA and has been authorized for detection and/or diagnosis of SARS-CoV-2 by FDA under an Emergency Use Authorization (EUA). This EUA will remain in effect (meaning this test can be used) for the duration of the COVID-19 declaration under Section 564(b)(1) of the Act, 21 U.S.C. section 360bbb-3(b)(1), unless the authorization is terminated or revoked.  Performed at [facility], [address], [place], [place] [address]      Radiology Studies: DG Chest 2 View  Result Date: 11/10/2020 CLINICAL DATA:  Chest pains EXAM: CHEST - 2 VIEW COMPARISON:  03/09/2020 FINDINGS: Cardiac shadow is mildly enlarged but stable. Postsurgical changes are again seen and stable. Lungs are well aerated bilaterally. Scarring is noted in the left base. No acute abnormality is noted. No bony abnormality is seen. IMPRESSION: Chronic changes without acute abnormality Electronically Signed   By: Inez Catalina M.D.   On: 11/10/2020 23:16   CT Angio Chest PE W and/or Wo Contrast  Result Date: 11/11/2020 CLINICAL DATA:  Abdominal pain and shortness of breath. EXAM: CT ANGIOGRAPHY CHEST WITH CONTRAST TECHNIQUE: Multidetector CT imaging of the chest was performed using the standard protocol during bolus administration of intravenous contrast. Multiplanar CT image reconstructions and MIPs were obtained to evaluate the vascular anatomy. CONTRAST:  11m OMNIPAQUE IOHEXOL 350 MG/ML SOLN COMPARISON:  Aug 13, 2013 FINDINGS: Cardiovascular: There is moderate severity calcification of the aortic arch, without evidence of aortic aneurysm or dissection. Satisfactory opacification of the pulmonary  arteries to the segmental level. No evidence of pulmonary embolism. The main pulmonary artery is dilated and measures approximately 3.5 cm. Normal heart size with marked severity coronary artery calcification. No pericardial effusion. Mediastinum/Nodes: Multiple sternal wires are present. Mild partially calcified pretracheal and bilateral hilar lymphadenopathy is seen. Thyroid gland, trachea, and esophagus demonstrate no significant findings. Lungs/Pleura: Centrilobular emphysema is again seen involving predominantly the bilateral upper lobes. Predominant stable, mild to moderate severity areas of linear scarring and/or atelectasis are seen within the bilateral upper lobes. Moderate severity atelectasis and/or infiltrate is seen within the posterior aspect of the bilateral lung bases. A predominant stable area of patchy appearing focal scarring is seen within the posterior aspect of the left lung base. There is no evidence of a pleural effusion or pneumothorax. Upper Abdomen: Numerous small parenchymal calcifications are seen scattered throughout the pancreas. Musculoskeletal: Degenerative changes seen throughout the thoracic spine. Review of the MIP images confirms the above findings. IMPRESSION: 1. No evidence of pulmonary embolism. 2. Moderate severity posterior bibasilar atelectasis and/or infiltrate. 3. Centrilobular emphysema with mild to moderate severity bilateral upper lobe linear scarring and/or atelectasis. 4. Stable area of patchy appearing focal scarring along the posterior aspect of the lung base. 5. Findings consistent with chronic pancreatitis. Electronically Signed   By: TVirgina NorfolkM.D.   On: 11/11/2020 01:41   CT ABDOMEN PELVIS W CONTRAST  Result Date: 11/11/2020 CLINICAL DATA:  Abdominal pain. EXAM: CT ABDOMEN AND PELVIS WITH CONTRAST TECHNIQUE: Multidetector CT imaging of the abdomen and pelvis was performed using the standard protocol following bolus  administration of intravenous  contrast. CONTRAST:  174m OMNIPAQUE IOHEXOL 350 MG/ML SOLN COMPARISON:  June 13, 2019 FINDINGS: Lower chest: Mild-to-moderate severe atelectasis and/or infiltrate is seen within the posterior aspect of the bilateral lung bases. Chronic patchy scarring is also seen within the left lung base. Hepatobiliary: No focal liver abnormality is seen. Status post cholecystectomy. The common bile duct measures 1.4 cm. Pancreas: Innumerable small calcifications are seen throughout the pancreatic parenchyma. Spleen: Normal in size without focal abnormality. Adrenals/Urinary Tract: Adrenal glands are unremarkable. Kidneys are normal in size without obstructing renal calculi or focal lesions. Mild to moderate severity bilateral hydronephrosis and hydroureter are seen, right greater than left. The urinary bladder is moderately distended. Stomach/Bowel: Stomach is within normal limits. Appendix appears normal. Stool is seen throughout the large bowel. Air-filled loops of otherwise normal-appearing small bowel appear to approach the upper limit of normal and are seen within the mid and lower abdomen. Vascular/Lymphatic: Aortic atherosclerosis. No enlarged abdominal or pelvic lymph nodes. Reproductive: Multiple heterogeneous uterine fibroids are present. The bilateral adnexa are unremarkable Other: No abdominal wall hernia or abnormality. No abdominopelvic ascites. Musculoskeletal: No acute or significant osseous findings. IMPRESSION: 1. Mild to moderate severity bibasilar atelectasis and/or infiltrate with stable, chronic patchy posterior left basilar scarring. 2. Findings consistent with chronic pancreatitis. 3. Small bowel loops which appear to approach the upper limit of normal caliber within the mid and lower abdomen. While this is likely transient in nature. Follow-up abdomen pelvis CT is recommended if early small-bowel obstruction is of clinical concern. 4. Moderately distended urinary bladder with subsequent bilateral  hydronephrosis and hydroureter. 5. Multiple heterogeneous uterine fibroids. Electronically Signed   By: TVirgina NorfolkM.D.   On: 11/11/2020 01:48   CT Angio Chest PE W and/or Wo Contrast  Final Result    CT ABDOMEN PELVIS W CONTRAST  Final Result    DG Chest 2 View  Final Result      Scheduled Meds:  albuterol  2 puff Inhalation Q6H   clopidogrel  75 mg Oral Daily   enoxaparin (LOVENOX) injection  40 mg Subcutaneous [id number]  folic acid  1 mg Oral Daily   hydrochlorothiazide  25 mg Oral Daily   insulin aspart  0-5 Units Subcutaneous QHS   insulin aspart  0-9 Units Subcutaneous TID WC   LORazepam  0-4 mg Intravenous Q6H   Or   LORazepam  0-4 mg Oral Q6H   [START ON 11/13/2020] LORazepam  0-4 mg Intravenous Q12H   Or   [START ON 11/13/2020] LORazepam  0-4 mg Oral Q12H   methylPREDNISolone (SOLU-MEDROL) injection  30 mg Intravenous Q12H   Followed by   [Derrill MemoON 11/14/2020] predniSONE  50 mg Oral Daily   metoprolol tartrate  25 mg Oral BID   multivitamin with minerals  1 tablet Oral Daily   potassium chloride  40 mEq Oral Q4H   pravastatin  40 mg Oral QHS   thiamine  100 mg Oral Daily   Or   thiamine  100 mg Intravenous Daily   PRN Meds: acetaminophen, morphine injection, ondansetron **OR** ondansetron (ZOFRAN) IV Continuous Infusions:  [START ON 11/12/2020] remdesivir 100 mg in NS 100 mL       LOS: 0 days  Time spent: Greater than 50% of the 35 minute visit was spent in counseling/coordination of care for the patient as laid out in the A&P.   DDwyane Dee MD [facility] 11/11/2020, 1:04 PM

## 2020-11-11 NOTE — Assessment & Plan Note (Signed)
-   will clarify if uses home CPAP

## 2020-11-11 NOTE — Assessment & Plan Note (Signed)
-   continue SSI and CBG monitoring  

## 2020-11-12 LAB — CBC WITH DIFFERENTIAL/PLATELET
Abs Immature Granulocytes: 0.08 10*3/uL — ABNORMAL HIGH (ref 0.00–0.07)
Basophils Absolute: 0 10*3/uL (ref 0.0–0.1)
Basophils Relative: 0 %
Eosinophils Absolute: 0 10*3/uL (ref 0.0–0.5)
Eosinophils Relative: 0 %
HCT: 39.2 % (ref 36.0–46.0)
Hemoglobin: 13 g/dL (ref 12.0–15.0)
Immature Granulocytes: 1 %
Lymphocytes Relative: 9 %
Lymphs Abs: 1 10*3/uL (ref 0.7–4.0)
MCH: 32.6 pg (ref 26.0–34.0)
MCHC: 33.2 g/dL (ref 30.0–36.0)
MCV: 98.2 fL (ref 80.0–100.0)
Monocytes Absolute: 0.9 10*3/uL (ref 0.1–1.0)
Monocytes Relative: 8 %
Neutro Abs: 9.7 10*3/uL — ABNORMAL HIGH (ref 1.7–7.7)
Neutrophils Relative %: 82 %
Platelets: 257 10*3/uL (ref 150–400)
RBC: 3.99 MIL/uL (ref 3.87–5.11)
RDW: 12.1 % (ref 11.5–15.5)
WBC: 11.6 10*3/uL — ABNORMAL HIGH (ref 4.0–10.5)
nRBC: 0 % (ref 0.0–0.2)

## 2020-11-12 LAB — GLUCOSE, CAPILLARY
Glucose-Capillary: 145 mg/dL — ABNORMAL HIGH (ref 70–99)
Glucose-Capillary: 232 mg/dL — ABNORMAL HIGH (ref 70–99)
Glucose-Capillary: 206 mg/dL — ABNORMAL HIGH (ref 70–99)
Glucose-Capillary: 245 mg/dL — ABNORMAL HIGH (ref 70–99)

## 2020-11-12 LAB — COMPREHENSIVE METABOLIC PANEL
ALT: 22 U/L (ref 0–44)
AST: 29 U/L (ref 15–41)
Albumin: 3.2 g/dL — ABNORMAL LOW (ref 3.5–5.0)
Alkaline Phosphatase: 74 U/L (ref 38–126)
Anion gap: 8 (ref 5–15)
BUN: 11 mg/dL (ref 6–20)
CO2: 29 mmol/L (ref 22–32)
Calcium: 9.6 mg/dL (ref 8.9–10.3)
Chloride: 100 mmol/L (ref 98–111)
Creatinine, Ser: 0.67 mg/dL (ref 0.44–1.00)
GFR, Estimated: >60 mL/min (ref >60)
Glucose, Bld: 189 mg/dL — ABNORMAL HIGH (ref 70–99)
Potassium: 4.4 mmol/L (ref 3.5–5.1)
Sodium: 137 mmol/L (ref 135–145)
Total Bilirubin: 0.4 mg/dL (ref 0.3–1.2)
Total Protein: 6.5 g/dL (ref 6.5–8.1)

## 2020-11-12 LAB — MAGNESIUM: Magnesium: 1.9 mg/dL (ref 1.7–2.4)

## 2020-11-12 LAB — LACTATE DEHYDROGENASE: LDH: 148 U/L (ref 98–192)

## 2020-11-12 LAB — C-REACTIVE PROTEIN: CRP: 0.8 mg/dL (ref <1.0)

## 2020-11-12 MED ORDER — NICOTINE 21 MG/24HR TD PT24
21.0000 mg | MEDICATED_PATCH | Freq: Every day | TRANSDERMAL | Status: DC
  Administered 2020-11-12 – 2020-11-15 (×4): 21 mg via TRANSDERMAL
  Filled 2020-11-12 (×4): qty 1

## 2020-11-12 MED ORDER — OXYBUTYNIN CHLORIDE ER 5 MG PO TB24
5.0000 mg | ORAL_TABLET | Freq: Every day | ORAL | Status: DC
  Administered 2020-11-12 – 2020-11-14 (×3): 5 mg via ORAL
  Filled 2020-11-12 (×4): qty 1

## 2020-11-12 MED ORDER — LOSARTAN POTASSIUM 50 MG PO TABS
100.0000 mg | ORAL_TABLET | Freq: Every day | ORAL | Status: DC
  Administered 2020-11-13 – 2020-11-14 (×2): 100 mg via ORAL
  Filled 2020-11-12 (×2): qty 2

## 2020-11-12 NOTE — Plan of Care (Signed)
Patient states breathing better and feeling better.  Independent with [facility]. Problem: Health Behavior/Discharge Planning: Goal: Ability to manage health-related needs will improve Outcome: Progressing   Problem: Clinical Measurements: Goal: Ability to maintain clinical measurements within normal limits will improve Outcome: Progressing   Problem: Clinical Measurements: Goal: Will remain free from infection Outcome: Progressing   Problem: Clinical Measurements: Goal: Diagnostic test results will improve Outcome: Progressing   Problem: Clinical Measurements: Goal: Respiratory complications will improve Outcome: Progressing

## 2020-11-12 NOTE — Progress Notes (Signed)
Progress Note    Marie [name]   [id number]  DOB: [birth year]  DOA: 11/10/2020     1  PCP: Gaynelle Arabian, MD  Initial CC: SOB, dyspnea   Hospital Course: [name] is a 55 yo female with PMH chronic pancreatitis, chronic abd pain, ongoing etoh use, HTN, OSA, sarcoidosis, DMII, CAD s/p CABG (2002) who presented to the ER with ongoing complaints of nonproductive cough and shortness of breath.  She started feeling poorly approximately 8 to 9 days prior to admission. Due to worsening pain and shortness of breath, she presented for further work-up. In the ER she was noted to be hypoxic requiring oxygen and was treated for her lower abdominal pain consistent with chronic pancreatitis. She was found to be positive for COVID-19 and was started on remdesivir and steroids.  Interval History:  No events overnight.  Pain better controlled this morning and breathing feels more comfortable as well she says.  ROS: Constitutional: negative for chills and fevers, Respiratory: positive for cough and dyspnea on exertion, negative for wheezing, Cardiovascular: negative for chest pain, and Gastrointestinal: negative for abdominal pain  Assessment & Plan: * Pneumonia due to COVID-19 virus - approx day 8 of symptoms on admission - continue remdesivir and steroids - trend inflammatory markers - encourage incentive spirometer and flutter  Acute respiratory failure due to COVID-19 ([place]) - not on home O2 - wean as able - etiology multifactorial from covid but also emphysema noted on CTA chest from hx tobacco use  CAD in native artery - s/p CABG in 2002 per careeverywhere  - continue home meds  ETOH abuse - still drinking ~ 4 glass of wine daily - patient counseled on reducing and/or stopping consumption in setting of chronic pancreatitis   Essential hypertension - continue hctz and lopressor  - resume losartan   COPD (chronic obstructive pulmonary disease) ([place]) - continue breathing  treatments - see above   Pulmonary sarcoidosis ([place]) - not on treatment   OSA (obstructive sleep apnea) - will clarify if uses home CPAP  Chronic pancreatitis ([facility]) - no acute changes; abd pain is chronic and at baseline - continue pain control   Tobacco use - cessation counseling also given on admission   Diabetes mellitus with complication ([place]) - continue SSI and CBG monitoring    Old records reviewed in assessment of this patient  Antimicrobials: Remdesivir 11/11/2020 >> current  DVT prophylaxis: enoxaparin (LOVENOX) injection 40 mg Start: 11/11/20 2200   Code Status:   Code Status: Full Code Family Communication: Husband  Disposition Plan: Status is: Inpatient  Remains inpatient appropriate because:IV treatments appropriate due to intensity of illness or inability to take PO and Inpatient level of care appropriate due to severity of illness  Dispo: The patient is from: Home              Anticipated d/c is to: Home              Patient currently is not medically stable to d/c.   Difficult to place patient No  Risk of unplanned readmission score: Unplanned Admission- Pilot do not use: 10.27   Objective: Blood pressure 123/73, pulse (!) 54, temperature 98.1 F (36.7 C), temperature source Oral, resp. rate 18, height '5\' 2"'$  (1.575 m), weight 56.7 kg, SpO2 92 %.  Examination: General appearance: alert, cooperative, and no distress Head: Normocephalic, without obvious abnormality, atraumatic Eyes:  EOMI Lungs:  Coarse breath sounds bilaterally, no wheezing Heart: regular rate and rhythm and S1, S2  normal Abdomen:  Tenderness to palpation throughout, no rebound or guarding.  Bowel sounds present Extremities:  No edema Skin: mobility and turgor normal Neurologic: Grossly normal  Consultants:    Procedures:    Data Reviewed: I have personally reviewed following labs and imaging studies Results for orders placed or performed during the hospital encounter of  11/10/20 (from the past 24 hour(s))  Glucose, capillary     Status: Abnormal   Collection Time: 11/11/20  8:25 PM  Result Value Ref Range   Glucose-Capillary 175 (H) 70 - 99 mg/dL  C-reactive protein     Status: None   Collection Time: 11/12/20  4:18 AM  Result Value Ref Range   CRP 0.8 <1.0 mg/dL  Lactate dehydrogenase     Status: None   Collection Time: 11/12/20  4:18 AM  Result Value Ref Range   LDH 148 98 - 192 U/L  CBC with Differential/Platelet     Status: Abnormal   Collection Time: 11/12/20  4:18 AM  Result Value Ref Range   WBC 11.6 (H) 4.0 - 10.5 K/uL   RBC 3.99 3.87 - 5.11 MIL/uL   Hemoglobin 13.0 12.0 - 15.0 g/dL   HCT 39.2 36.0 - 46.0 %   MCV 98.2 80.0 - 100.0 fL   MCH 32.6 26.0 - 34.0 pg   MCHC 33.2 30.0 - 36.0 g/dL   RDW 12.1 11.5 - 15.5 %   Platelets 257 150 - 400 K/uL   nRBC 0.0 0.0 - 0.2 %   Neutrophils Relative % 82 %   Neutro Abs 9.7 (H) 1.7 - 7.7 K/uL   Lymphocytes Relative 9 %   Lymphs Abs 1.0 0.7 - 4.0 K/uL   Monocytes Relative 8 %   Monocytes Absolute 0.9 0.1 - 1.0 K/uL   Eosinophils Relative 0 %   Eosinophils Absolute 0.0 0.0 - 0.5 K/uL   Basophils Relative 0 %   Basophils Absolute 0.0 0.0 - 0.1 K/uL   Immature Granulocytes 1 %   Abs Immature Granulocytes 0.08 (H) 0.00 - 0.07 K/uL  Comprehensive metabolic panel     Status: Abnormal   Collection Time: 11/12/20  4:18 AM  Result Value Ref Range   Sodium 137 135 - 145 mmol/L   Potassium 4.4 3.5 - 5.1 mmol/L   Chloride 100 98 - 111 mmol/L   CO2 29 22 - 32 mmol/L   Glucose, Bld 189 (H) 70 - 99 mg/dL   BUN 11 6 - 20 mg/dL   Creatinine, Ser 0.67 0.44 - 1.00 mg/dL   Calcium 9.6 8.9 - 10.3 mg/dL   Total Protein 6.5 6.5 - 8.1 g/dL   Albumin 3.2 (L) 3.5 - 5.0 g/dL   AST 29 15 - 41 U/L   ALT 22 0 - 44 U/L   Alkaline Phosphatase 74 38 - 126 U/L   Total Bilirubin 0.4 0.3 - 1.2 mg/dL   GFR, Estimated >60 >60 mL/min   Anion gap 8 5 - 15  Magnesium     Status: None   Collection Time: 11/12/20  4:18 AM   Result Value Ref Range   Magnesium 1.9 1.7 - 2.4 mg/dL  Glucose, capillary     Status: Abnormal   Collection Time: 11/12/20  8:08 AM  Result Value Ref Range   Glucose-Capillary 145 (H) 70 - 99 mg/dL  Glucose, capillary     Status: Abnormal   Collection Time: 11/12/20 11:39 AM  Result Value Ref Range   Glucose-Capillary 232 (H) 70 - 99 mg/dL  Comment 1 Notify RN    Comment 2 Document in Chart     Recent Results (from the past 240 hour(s))  Resp Panel by RT-PCR (Flu A&B, Covid) Nasopharyngeal Swab     Status: Abnormal   Collection Time: 11/11/20 12:08 AM   Specimen: Nasopharyngeal Swab; Nasopharyngeal(NP) swabs in vial transport medium  Result Value Ref Range Status   SARS Coronavirus 2 by RT PCR POSITIVE (A) NEGATIVE Final    Comment: RESULT CALLED TO, READ BACK BY AND VERIFIED WITH: TIA BULLOCK RN [id number] 11/10/20 HNM (NOTE) SARS-CoV-2 target nucleic acids are DETECTED.  The SARS-CoV-2 RNA is generally detectable in upper respiratory specimens during the acute phase of infection. Positive results are indicative of the presence of the identified virus, but do not rule out bacterial infection or co-infection with other pathogens not detected by the test. Clinical correlation with patient history and other diagnostic information is necessary to determine patient infection status. The expected result is Negative.  Fact Sheet for Patients: EntrepreneurPulse.com.au  Fact Sheet for Healthcare Providers: IncredibleEmployment.be  This test is not yet approved or cleared by the [place] FDA and  has been authorized for detection and/or diagnosis of SARS-CoV-2 by FDA under an Emergency Use Authorization (EUA).  This EUA will remain in effect (meaning this test can be u sed) for the duration of  the COVID-19 declaration under Section 564(b)(1) of the Act, 21 U.S.C. section 360bbb-3(b)(1), unless the authorization is terminated or revoked  sooner.     Influenza A by PCR NEGATIVE NEGATIVE Final   Influenza B by PCR NEGATIVE NEGATIVE Final    Comment: (NOTE) The Xpert Xpress SARS-CoV-2/FLU/RSV plus assay is intended as an aid in the diagnosis of influenza from Nasopharyngeal swab specimens and should not be used as a sole basis for treatment. Nasal washings and aspirates are unacceptable for Xpert Xpress SARS-CoV-2/FLU/RSV testing.  Fact Sheet for Patients: EntrepreneurPulse.com.au  Fact Sheet for Healthcare Providers: IncredibleEmployment.be  This test is not yet approved or cleared by the [place] FDA and has been authorized for detection and/or diagnosis of SARS-CoV-2 by FDA under an Emergency Use Authorization (EUA). This EUA will remain in effect (meaning this test can be used) for the duration of the COVID-19 declaration under Section 564(b)(1) of the Act, 21 U.S.C. section 360bbb-3(b)(1), unless the authorization is terminated or revoked.  Performed at [facility], [address], [place], [place] [address]      Radiology Studies: DG Chest 2 View  Result Date: 11/10/2020 CLINICAL DATA:  Chest pains EXAM: CHEST - 2 VIEW COMPARISON:  03/09/2020 FINDINGS: Cardiac shadow is mildly enlarged but stable. Postsurgical changes are again seen and stable. Lungs are well aerated bilaterally. Scarring is noted in the left base. No acute abnormality is noted. No bony abnormality is seen. IMPRESSION: Chronic changes without acute abnormality Electronically Signed   By: Inez Catalina M.D.   On: 11/10/2020 23:16   CT Angio Chest PE W and/or Wo Contrast  Result Date: 11/11/2020 CLINICAL DATA:  Abdominal pain and shortness of breath. EXAM: CT ANGIOGRAPHY CHEST WITH CONTRAST TECHNIQUE: Multidetector CT imaging of the chest was performed using the standard protocol during bolus administration of intravenous contrast. Multiplanar CT image reconstructions and MIPs were obtained to  evaluate the vascular anatomy. CONTRAST:  163m OMNIPAQUE IOHEXOL 350 MG/ML SOLN COMPARISON:  Aug 13, 2013 FINDINGS: Cardiovascular: There is moderate severity calcification of the aortic arch, without evidence of aortic aneurysm or dissection. Satisfactory opacification of the pulmonary arteries to  the segmental level. No evidence of pulmonary embolism. The main pulmonary artery is dilated and measures approximately 3.5 cm. Normal heart size with marked severity coronary artery calcification. No pericardial effusion. Mediastinum/Nodes: Multiple sternal wires are present. Mild partially calcified pretracheal and bilateral hilar lymphadenopathy is seen. Thyroid gland, trachea, and esophagus demonstrate no significant findings. Lungs/Pleura: Centrilobular emphysema is again seen involving predominantly the bilateral upper lobes. Predominant stable, mild to moderate severity areas of linear scarring and/or atelectasis are seen within the bilateral upper lobes. Moderate severity atelectasis and/or infiltrate is seen within the posterior aspect of the bilateral lung bases. A predominant stable area of patchy appearing focal scarring is seen within the posterior aspect of the left lung base. There is no evidence of a pleural effusion or pneumothorax. Upper Abdomen: Numerous small parenchymal calcifications are seen scattered throughout the pancreas. Musculoskeletal: Degenerative changes seen throughout the thoracic spine. Review of the MIP images confirms the above findings. IMPRESSION: 1. No evidence of pulmonary embolism. 2. Moderate severity posterior bibasilar atelectasis and/or infiltrate. 3. Centrilobular emphysema with mild to moderate severity bilateral upper lobe linear scarring and/or atelectasis. 4. Stable area of patchy appearing focal scarring along the posterior aspect of the lung base. 5. Findings consistent with chronic pancreatitis. Electronically Signed   By: Virgina Norfolk M.D.   On: 11/11/2020 01:41    CT ABDOMEN PELVIS W CONTRAST  Result Date: 11/11/2020 CLINICAL DATA:  Abdominal pain. EXAM: CT ABDOMEN AND PELVIS WITH CONTRAST TECHNIQUE: Multidetector CT imaging of the abdomen and pelvis was performed using the standard protocol following bolus administration of intravenous contrast. CONTRAST:  151m OMNIPAQUE IOHEXOL 350 MG/ML SOLN COMPARISON:  June 13, 2019 FINDINGS: Lower chest: Mild-to-moderate severe atelectasis and/or infiltrate is seen within the posterior aspect of the bilateral lung bases. Chronic patchy scarring is also seen within the left lung base. Hepatobiliary: No focal liver abnormality is seen. Status post cholecystectomy. The common bile duct measures 1.4 cm. Pancreas: Innumerable small calcifications are seen throughout the pancreatic parenchyma. Spleen: Normal in size without focal abnormality. Adrenals/Urinary Tract: Adrenal glands are unremarkable. Kidneys are normal in size without obstructing renal calculi or focal lesions. Mild to moderate severity bilateral hydronephrosis and hydroureter are seen, right greater than left. The urinary bladder is moderately distended. Stomach/Bowel: Stomach is within normal limits. Appendix appears normal. Stool is seen throughout the large bowel. Air-filled loops of otherwise normal-appearing small bowel appear to approach the upper limit of normal and are seen within the mid and lower abdomen. Vascular/Lymphatic: Aortic atherosclerosis. No enlarged abdominal or pelvic lymph nodes. Reproductive: Multiple heterogeneous uterine fibroids are present. The bilateral adnexa are unremarkable Other: No abdominal wall hernia or abnormality. No abdominopelvic ascites. Musculoskeletal: No acute or significant osseous findings. IMPRESSION: 1. Mild to moderate severity bibasilar atelectasis and/or infiltrate with stable, chronic patchy posterior left basilar scarring. 2. Findings consistent with chronic pancreatitis. 3. Small bowel loops which appear to  approach the upper limit of normal caliber within the mid and lower abdomen. While this is likely transient in nature. Follow-up abdomen pelvis CT is recommended if early small-bowel obstruction is of clinical concern. 4. Moderately distended urinary bladder with subsequent bilateral hydronephrosis and hydroureter. 5. Multiple heterogeneous uterine fibroids. Electronically Signed   By: TVirgina NorfolkM.D.   On: 11/11/2020 01:48   CT Angio Chest PE W and/or Wo Contrast  Final Result    CT ABDOMEN PELVIS W CONTRAST  Final Result    DG Chest 2 View  Final Result  Scheduled Meds:  albuterol  2 puff Inhalation Q6H   clopidogrel  75 mg Oral Daily   enoxaparin (LOVENOX) injection  40 mg Subcutaneous [id number]   folic acid  1 mg Oral Daily   hydrochlorothiazide  25 mg Oral Daily   insulin aspart  0-5 Units Subcutaneous QHS   insulin aspart  0-9 Units Subcutaneous TID WC   LORazepam  0-4 mg Intravenous Q6H   Or   LORazepam  0-4 mg Oral Q6H   [START ON 11/13/2020] LORazepam  0-4 mg Intravenous Q12H   Or   [START ON 11/13/2020] LORazepam  0-4 mg Oral Q12H   [START ON 11/13/2020] losartan  100 mg Oral Daily   methylPREDNISolone (SOLU-MEDROL) injection  30 mg Intravenous Q12H   Followed by   Derrill Memo ON 11/14/2020] predniSONE  50 mg Oral Daily   metoprolol tartrate  25 mg Oral BID   multivitamin with minerals  1 tablet Oral Daily   nicotine  21 mg Transdermal Daily   oxybutynin  5 mg Oral QHS   pravastatin  40 mg Oral QHS   thiamine  100 mg Oral Daily   Or   thiamine  100 mg Intravenous Daily   PRN Meds: acetaminophen, morphine injection, ondansetron **OR** ondansetron (ZOFRAN) IV, oxyCODONE Continuous Infusions:  remdesivir 100 mg in NS 100 mL Stopped (11/12/20 1019)     LOS: 1 day  Time spent: Greater than 50% of the 35 minute visit was spent in counseling/coordination of care for the patient as laid out in the A&P.   Dwyane Dee, MD [facility] 11/12/2020, 4:59 PM

## 2020-11-13 LAB — CBC WITH DIFFERENTIAL/PLATELET
Abs Immature Granulocytes: 0.04 10*3/uL (ref 0.00–0.07)
Basophils Absolute: 0 10*3/uL (ref 0.0–0.1)
Basophils Relative: 0 %
Eosinophils Absolute: 0 10*3/uL (ref 0.0–0.5)
Eosinophils Relative: 0 %
HCT: 42.4 % (ref 36.0–46.0)
Hemoglobin: 14.1 g/dL (ref 12.0–15.0)
Immature Granulocytes: 0 %
Lymphocytes Relative: 10 %
Lymphs Abs: 1 10*3/uL (ref 0.7–4.0)
MCH: 32 pg (ref 26.0–34.0)
MCHC: 33.3 g/dL (ref 30.0–36.0)
MCV: 96.4 fL (ref 80.0–100.0)
Monocytes Absolute: 0.6 10*3/uL (ref 0.1–1.0)
Monocytes Relative: 6 %
Neutro Abs: 8.6 10*3/uL — ABNORMAL HIGH (ref 1.7–7.7)
Neutrophils Relative %: 84 %
Platelets: 264 10*3/uL (ref 150–400)
RBC: 4.4 MIL/uL (ref 3.87–5.11)
RDW: 12 % (ref 11.5–15.5)
WBC: 10.2 10*3/uL (ref 4.0–10.5)
nRBC: 0 % (ref 0.0–0.2)

## 2020-11-13 LAB — COMPREHENSIVE METABOLIC PANEL
ALT: 18 U/L (ref 0–44)
AST: 23 U/L (ref 15–41)
Albumin: 3.2 g/dL — ABNORMAL LOW (ref 3.5–5.0)
Alkaline Phosphatase: 70 U/L (ref 38–126)
Anion gap: 7 (ref 5–15)
BUN: 12 mg/dL (ref 6–20)
CO2: 32 mmol/L (ref 22–32)
Calcium: 9.2 mg/dL (ref 8.9–10.3)
Chloride: 96 mmol/L — ABNORMAL LOW (ref 98–111)
Creatinine, Ser: 0.72 mg/dL (ref 0.44–1.00)
GFR, Estimated: >60 mL/min (ref >60)
Glucose, Bld: 146 mg/dL — ABNORMAL HIGH (ref 70–99)
Potassium: 3.8 mmol/L (ref 3.5–5.1)
Sodium: 135 mmol/L (ref 135–145)
Total Bilirubin: 0.6 mg/dL (ref 0.3–1.2)
Total Protein: 6.8 g/dL (ref 6.5–8.1)

## 2020-11-13 LAB — GLUCOSE, CAPILLARY
Glucose-Capillary: 130 mg/dL — ABNORMAL HIGH (ref 70–99)
Glucose-Capillary: 211 mg/dL — ABNORMAL HIGH (ref 70–99)
Glucose-Capillary: 202 mg/dL — ABNORMAL HIGH (ref 70–99)
Glucose-Capillary: 156 mg/dL — ABNORMAL HIGH (ref 70–99)

## 2020-11-13 LAB — LACTATE DEHYDROGENASE: LDH: 145 U/L (ref 98–192)

## 2020-11-13 LAB — C-REACTIVE PROTEIN: CRP: 0.6 mg/dL (ref <1.0)

## 2020-11-13 LAB — MAGNESIUM: Magnesium: 1.9 mg/dL (ref 1.7–2.4)

## 2020-11-13 NOTE — Progress Notes (Signed)
Patient on Room Air and maintained 98% SpO2 while ambulating in room for five minutes.

## 2020-11-13 NOTE — Progress Notes (Signed)
Progress Note    Marie [name]   [id number]  DOB: June 09, 1965  DOA: 11/10/2020     2  PCP: Gaynelle Arabian, MD  Initial CC: SOB, dyspnea   Hospital Course: Marie [name] is a 55 yo female with PMH chronic pancreatitis, chronic abd pain, ongoing etoh use, HTN, OSA, sarcoidosis, DMII, CAD s/p CABG (2002) who presented to the ER with ongoing complaints of nonproductive cough and shortness of breath.  She started feeling poorly approximately 8 to 9 days prior to admission. Due to worsening pain and shortness of breath, she presented for further work-up. In the ER she was noted to be hypoxic requiring oxygen and was treated for her lower abdominal pain consistent with chronic pancreatitis. She was found to be positive for COVID-19 and was started on remdesivir and steroids.  Interval History:  No events overnight.  Doing well this morning.  Husband bedside.  She underwent a walk test this afternoon in her room and did not desaturate on room air.  She was taken off oxygen at that time.  ROS: Constitutional: negative for chills and fevers, Respiratory: positive for cough and dyspnea on exertion, negative for wheezing, Cardiovascular: negative for chest pain, and Gastrointestinal: negative for abdominal pain  Assessment & Plan: * Pneumonia due to COVID-19 virus - approx day 8 of symptoms on admission - continue remdesivir and steroids - trend inflammatory markers - encourage incentive spirometer and flutter  Acute respiratory failure due to COVID-19 (HCC)-resolved as of 11/13/2020 - not on home O2 - wean as able; able to be weaned to room air on 11/13/2020 - etiology multifactorial from covid but also emphysema noted on CTA chest from hx tobacco use  CAD in native artery - s/p CABG in 2002 per careeverywhere  - continue home meds  ETOH abuse - still drinking ~ 4 glass of wine daily - patient counseled on reducing and/or stopping consumption in setting of chronic pancreatitis    Essential hypertension - continue hctz and lopressor  - resume losartan   COPD (chronic obstructive pulmonary disease) ([place]) - continue breathing treatments - see above   Pulmonary sarcoidosis ([place]) - not on treatment   OSA (obstructive sleep apnea) - will clarify if uses home CPAP  Chronic pancreatitis ([place]) - no acute changes; abd pain is chronic and at baseline - continue pain control   Tobacco use - cessation counseling also given on admission   Diabetes mellitus with complication ([place]) - continue SSI and CBG monitoring    Old records reviewed in assessment of this patient  Antimicrobials: Remdesivir 11/11/2020 >> current  DVT prophylaxis: enoxaparin (LOVENOX) injection 40 mg Start: 11/11/20 2200   Code Status:   Code Status: Full Code Family Communication: Husband  Disposition Plan: Status is: Inpatient  Remains inpatient appropriate because:IV treatments appropriate due to intensity of illness or inability to take PO and Inpatient level of care appropriate due to severity of illness  Dispo: The patient is from: Home              Anticipated d/c is to: Home tentative for 11/15/2020              Patient currently is not medically stable to d/c.   Difficult to place patient No  Risk of unplanned readmission score: Unplanned Admission- Pilot do not use: 9.47   Objective: Blood pressure 136/87, pulse 65, temperature 97.6 F (36.4 C), temperature source Oral, resp. rate 16, height '5\' 2"'$  (1.575 m), weight 56.7 kg, SpO2 98 %.  Examination: General appearance: alert, cooperative, and no distress Head: Normocephalic, without obvious abnormality, atraumatic Eyes:  EOMI Lungs:  Coarse breath sounds bilaterally, no wheezing Heart: regular rate and rhythm and S1, S2 normal Abdomen:  Tenderness to palpation throughout, no rebound or guarding.  Bowel sounds present Extremities:  No edema Skin: mobility and turgor normal Neurologic: Grossly normal  Consultants:     Procedures:    Data Reviewed: I have personally reviewed following labs and imaging studies Results for orders placed or performed during the hospital encounter of 11/10/20 (from the past 24 hour(s))  Glucose, capillary     Status: Abnormal   Collection Time: 11/12/20  5:19 PM  Result Value Ref Range   Glucose-Capillary 206 (H) 70 - 99 mg/dL   Comment 1 Notify RN    Comment 2 Document in Chart   Glucose, capillary     Status: Abnormal   Collection Time: 11/12/20  9:49 PM  Result Value Ref Range   Glucose-Capillary 245 (H) 70 - 99 mg/dL  C-reactive protein     Status: None   Collection Time: 11/13/20  4:16 AM  Result Value Ref Range   CRP 0.6 <1.0 mg/dL  Lactate dehydrogenase     Status: None   Collection Time: 11/13/20  4:16 AM  Result Value Ref Range   LDH 145 98 - 192 U/L  CBC with Differential/Platelet     Status: Abnormal   Collection Time: 11/13/20  4:16 AM  Result Value Ref Range   WBC 10.2 4.0 - 10.5 K/uL   RBC 4.40 3.87 - 5.11 MIL/uL   Hemoglobin 14.1 12.0 - 15.0 g/dL   HCT 42.4 36.0 - 46.0 %   MCV 96.4 80.0 - 100.0 fL   MCH 32.0 26.0 - 34.0 pg   MCHC 33.3 30.0 - 36.0 g/dL   RDW 12.0 11.5 - 15.5 %   Platelets 264 150 - 400 K/uL   nRBC 0.0 0.0 - 0.2 %   Neutrophils Relative % 84 %   Neutro Abs 8.6 (H) 1.7 - 7.7 K/uL   Lymphocytes Relative 10 %   Lymphs Abs 1.0 0.7 - 4.0 K/uL   Monocytes Relative 6 %   Monocytes Absolute 0.6 0.1 - 1.0 K/uL   Eosinophils Relative 0 %   Eosinophils Absolute 0.0 0.0 - 0.5 K/uL   Basophils Relative 0 %   Basophils Absolute 0.0 0.0 - 0.1 K/uL   Immature Granulocytes 0 %   Abs Immature Granulocytes 0.04 0.00 - 0.07 K/uL  Comprehensive metabolic panel     Status: Abnormal   Collection Time: 11/13/20  4:16 AM  Result Value Ref Range   Sodium 135 135 - 145 mmol/L   Potassium 3.8 3.5 - 5.1 mmol/L   Chloride 96 (L) 98 - 111 mmol/L   CO2 32 22 - 32 mmol/L   Glucose, Bld 146 (H) 70 - 99 mg/dL   BUN 12 6 - 20 mg/dL   Creatinine,  Ser 0.72 0.44 - 1.00 mg/dL   Calcium 9.2 8.9 - 10.3 mg/dL   Total Protein 6.8 6.5 - 8.1 g/dL   Albumin 3.2 (L) 3.5 - 5.0 g/dL   AST 23 15 - 41 U/L   ALT 18 0 - 44 U/L   Alkaline Phosphatase 70 38 - 126 U/L   Total Bilirubin 0.6 0.3 - 1.2 mg/dL   GFR, Estimated >60 >60 mL/min   Anion gap 7 5 - 15  Magnesium     Status: None   Collection Time:  11/13/20  4:16 AM  Result Value Ref Range   Magnesium 1.9 1.7 - 2.4 mg/dL  Glucose, capillary     Status: Abnormal   Collection Time: 11/13/20  8:53 AM  Result Value Ref Range   Glucose-Capillary 130 (H) 70 - 99 mg/dL  Glucose, capillary     Status: Abnormal   Collection Time: 11/13/20 11:49 AM  Result Value Ref Range   Glucose-Capillary 211 (H) 70 - 99 mg/dL    Recent Results (from the past 240 hour(s))  Resp Panel by RT-PCR (Flu A&B, Covid) Nasopharyngeal Swab     Status: Abnormal   Collection Time: 11/11/20 12:08 AM   Specimen: Nasopharyngeal Swab; Nasopharyngeal(NP) swabs in vial transport medium  Result Value Ref Range Status   SARS Coronavirus 2 by RT PCR POSITIVE (A) NEGATIVE Final    Comment: RESULT CALLED TO, READ BACK BY AND VERIFIED WITH: TIA BULLOCK RN [id number] 11/10/20 HNM (NOTE) SARS-CoV-2 target nucleic acids are DETECTED.  The SARS-CoV-2 RNA is generally detectable in upper respiratory specimens during the acute phase of infection. Positive results are indicative of the presence of the identified virus, but do not rule out bacterial infection or co-infection with other pathogens not detected by the test. Clinical correlation with patient history and other diagnostic information is necessary to determine patient infection status. The expected result is Negative.  Fact Sheet for Patients: EntrepreneurPulse.com.au  Fact Sheet for Healthcare Providers: IncredibleEmployment.be  This test is not yet approved or cleared by the [place] FDA and  has been authorized for detection and/or  diagnosis of SARS-CoV-2 by FDA under an Emergency Use Authorization (EUA).  This EUA will remain in effect (meaning this test can be u sed) for the duration of  the COVID-19 declaration under Section 564(b)(1) of the Act, 21 U.S.C. section 360bbb-3(b)(1), unless the authorization is terminated or revoked sooner.     Influenza A by PCR NEGATIVE NEGATIVE Final   Influenza B by PCR NEGATIVE NEGATIVE Final    Comment: (NOTE) The Xpert Xpress SARS-CoV-2/FLU/RSV plus assay is intended as an aid in the diagnosis of influenza from Nasopharyngeal swab specimens and should not be used as a sole basis for treatment. Nasal washings and aspirates are unacceptable for Xpert Xpress SARS-CoV-2/FLU/RSV testing.  Fact Sheet for Patients: EntrepreneurPulse.com.au  Fact Sheet for Healthcare Providers: IncredibleEmployment.be  This test is not yet approved or cleared by the [place] FDA and has been authorized for detection and/or diagnosis of SARS-CoV-2 by FDA under an Emergency Use Authorization (EUA). This EUA will remain in effect (meaning this test can be used) for the duration of the COVID-19 declaration under Section 564(b)(1) of the Act, 21 U.S.C. section 360bbb-3(b)(1), unless the authorization is terminated or revoked.  Performed at [facility], [address] [place], [place] [address]      Radiology Studies: No results found. CT Angio Chest PE W and/or Wo Contrast  Final Result    CT ABDOMEN PELVIS W CONTRAST  Final Result    DG Chest 2 View  Final Result      Scheduled Meds:  albuterol  2 puff Inhalation Q6H   clopidogrel  75 mg Oral Daily   enoxaparin (LOVENOX) injection  40 mg Subcutaneous [id number]   folic acid  1 mg Oral Daily   hydrochlorothiazide  25 mg Oral Daily   insulin aspart  0-5 Units Subcutaneous QHS   insulin aspart  0-9 Units Subcutaneous TID WC   LORazepam  0-4 mg Intravenous Q12H  Or   LORazepam  0-4  mg Oral Q12H   losartan  100 mg Oral Daily   methylPREDNISolone (SOLU-MEDROL) injection  30 mg Intravenous Q12H   Followed by   Derrill Memo ON 11/14/2020] predniSONE  50 mg Oral Daily   metoprolol tartrate  25 mg Oral BID   multivitamin with minerals  1 tablet Oral Daily   nicotine  21 mg Transdermal Daily   oxybutynin  5 mg Oral QHS   pravastatin  40 mg Oral QHS   thiamine  100 mg Oral Daily   Or   thiamine  100 mg Intravenous Daily   PRN Meds: acetaminophen, morphine injection, ondansetron **OR** ondansetron (ZOFRAN) IV, oxyCODONE Continuous Infusions:  remdesivir 100 mg in NS 100 mL 100 mg (11/13/20 1217)     LOS: 2 days  Time spent: Greater than 50% of the 35 minute visit was spent in counseling/coordination of care for the patient as laid out in the A&P.   Dwyane Dee, MD [facility] 11/13/2020, 3:52 PM

## 2020-11-13 NOTE — Plan of Care (Signed)

## 2020-11-14 LAB — CBC WITH DIFFERENTIAL/PLATELET
Abs Immature Granulocytes: 0.06 10*3/uL (ref 0.00–0.07)
Basophils Absolute: 0 10*3/uL (ref 0.0–0.1)
Basophils Relative: 0 %
Eosinophils Absolute: 0 10*3/uL (ref 0.0–0.5)
Eosinophils Relative: 0 %
HCT: 45.9 % (ref 36.0–46.0)
Hemoglobin: 15.3 g/dL — ABNORMAL HIGH (ref 12.0–15.0)
Immature Granulocytes: 1 %
Lymphocytes Relative: 10 %
Lymphs Abs: 0.9 10*3/uL (ref 0.7–4.0)
MCH: 32.5 pg (ref 26.0–34.0)
MCHC: 33.3 g/dL (ref 30.0–36.0)
MCV: 97.5 fL (ref 80.0–100.0)
Monocytes Absolute: 0.7 10*3/uL (ref 0.1–1.0)
Monocytes Relative: 8 %
Neutro Abs: 7.1 10*3/uL (ref 1.7–7.7)
Neutrophils Relative %: 81 %
Platelets: 279 10*3/uL (ref 150–400)
RBC: 4.71 MIL/uL (ref 3.87–5.11)
RDW: 11.6 % (ref 11.5–15.5)
WBC: 8.8 10*3/uL (ref 4.0–10.5)
nRBC: 0 % (ref 0.0–0.2)

## 2020-11-14 LAB — COMPREHENSIVE METABOLIC PANEL
ALT: 22 U/L (ref 0–44)
AST: 27 U/L (ref 15–41)
Albumin: 3.4 g/dL — ABNORMAL LOW (ref 3.5–5.0)
Alkaline Phosphatase: 78 U/L (ref 38–126)
Anion gap: 6 (ref 5–15)
BUN: 16 mg/dL (ref 6–20)
CO2: 32 mmol/L (ref 22–32)
Calcium: 9.6 mg/dL (ref 8.9–10.3)
Chloride: 95 mmol/L — ABNORMAL LOW (ref 98–111)
Creatinine, Ser: 0.58 mg/dL (ref 0.44–1.00)
GFR, Estimated: >60 mL/min (ref >60)
Glucose, Bld: 238 mg/dL — ABNORMAL HIGH (ref 70–99)
Potassium: 3.7 mmol/L (ref 3.5–5.1)
Sodium: 133 mmol/L — ABNORMAL LOW (ref 135–145)
Total Bilirubin: 0.4 mg/dL (ref 0.3–1.2)
Total Protein: 7 g/dL (ref 6.5–8.1)

## 2020-11-14 LAB — GLUCOSE, CAPILLARY
Glucose-Capillary: 164 mg/dL — ABNORMAL HIGH (ref 70–99)
Glucose-Capillary: 123 mg/dL — ABNORMAL HIGH (ref 70–99)
Glucose-Capillary: 281 mg/dL — ABNORMAL HIGH (ref 70–99)
Glucose-Capillary: 239 mg/dL — ABNORMAL HIGH (ref 70–99)
Glucose-Capillary: 232 mg/dL — ABNORMAL HIGH (ref 70–99)
Glucose-Capillary: 212 mg/dL — ABNORMAL HIGH (ref 70–99)

## 2020-11-14 LAB — MAGNESIUM: Magnesium: 2.1 mg/dL (ref 1.7–2.4)

## 2020-11-14 LAB — C-REACTIVE PROTEIN: CRP: 0.9 mg/dL (ref <1.0)

## 2020-11-14 LAB — LACTATE DEHYDROGENASE: LDH: 154 U/L (ref 98–192)

## 2020-11-14 MED ORDER — SODIUM CHLORIDE 0.9 % IV BOLUS
500.0000 mL | Freq: Once | INTRAVENOUS | Status: AC
  Administered 2020-11-14: 500 mL via INTRAVENOUS

## 2020-11-14 NOTE — Progress Notes (Signed)
Patient was 95% on room air at rest  Patient was 96% on room air ambulating

## 2020-11-14 NOTE — Progress Notes (Signed)
Progress Note    ARSEMA WERTMAN   [id number]  DOB: [birth year]  DOA: 11/10/2020     3  PCP: Gaynelle Arabian, MD  Initial CC: SOB, dyspnea   Hospital Course: Ms. Macbride is a 55 yo female with PMH chronic pancreatitis, chronic abd pain, ongoing etoh use, HTN, OSA, sarcoidosis, DMII, CAD s/p CABG (2002) who presented to the ER with ongoing complaints of nonproductive cough and shortness of breath.  She started feeling poorly approximately 8 to 9 days prior to admission. Due to worsening pain and shortness of breath, she presented for further work-up. In the ER she was noted to be hypoxic requiring oxygen and was treated for her lower abdominal pain consistent with chronic pancreatitis. She was found to be positive for COVID-19 and was started on remdesivir and steroids.  Interval History:  No events overnight.  Had some abdominal pain this morning but was well treated with pain medications he says.  She underwent repeat walk test this morning as she was somehow placed back on oxygen since yesterday.  She is saturating well on room air and was kept off of oxygen after testing.  ROS: Constitutional: negative for chills and fevers, Respiratory: positive for cough and dyspnea on exertion, negative for wheezing, Cardiovascular: negative for chest pain, and Gastrointestinal: negative for abdominal pain  Assessment & Plan: * Pneumonia due to COVID-19 virus - approx day 8 of symptoms on admission - continue remdesivir and steroids - trend inflammatory markers - encourage incentive spirometer and flutter  Acute respiratory failure due to COVID-19 (HCC)-resolved as of 11/13/2020 - not on home O2 - wean as able; able to be weaned to room air on 11/13/2020 - etiology multifactorial from covid but also emphysema noted on CTA chest from hx tobacco use  CAD in native artery - s/p CABG in 2002 per careeverywhere  - continue home meds  ETOH abuse - still drinking ~ 4 glass of wine daily -  patient counseled on reducing and/or stopping consumption in setting of chronic pancreatitis   Essential hypertension - continue hctz and lopressor  - resume losartan   COPD (chronic obstructive pulmonary disease) ([place]) - continue breathing treatments - see above   Pulmonary sarcoidosis ([place]) - not on treatment   OSA (obstructive sleep apnea) - will clarify if uses home CPAP  Chronic pancreatitis ([place]) - no acute changes; abd pain is chronic and at baseline - continue pain control   Tobacco use - cessation counseling also given on admission   Diabetes mellitus with complication ([place]) - continue SSI and CBG monitoring    Old records reviewed in assessment of this patient  Antimicrobials: Remdesivir 11/11/2020 >> current  DVT prophylaxis: enoxaparin (LOVENOX) injection 40 mg Start: 11/11/20 2200   Code Status:   Code Status: Full Code Family Communication: Husband  Disposition Plan: Status is: Inpatient  Remains inpatient appropriate because:IV treatments appropriate due to intensity of illness or inability to take PO and Inpatient level of care appropriate due to severity of illness  Dispo: The patient is from: Home              Anticipated d/c is to: Home tentative for 11/15/2020              Patient currently is not medically stable to d/c.   Difficult to place patient No  Risk of unplanned readmission score: Unplanned Admission- Pilot do not use: 9.52   Objective: Blood pressure 135/82, pulse 63, temperature 98 F (36.7 C), temperature source  Oral, resp. rate 18, height '5\' 2"'$  (1.575 m), weight 56.7 kg, SpO2 96 %.  Examination: General appearance: alert, cooperative, and no distress Head: Normocephalic, without obvious abnormality, atraumatic Eyes:  EOMI Lungs:  Coarse breath sounds bilaterally, no wheezing Heart: regular rate and rhythm and S1, S2 normal Abdomen:  Tenderness to palpation throughout, no rebound or guarding.  Bowel sounds present Extremities:   No edema Skin: mobility and turgor normal Neurologic: Grossly normal  Consultants:    Procedures:    Data Reviewed: I have personally reviewed following labs and imaging studies Results for orders placed or performed during the hospital encounter of 11/10/20 (from the past 24 hour(s))  Glucose, capillary     Status: Abnormal   Collection Time: 11/13/20  4:27 PM  Result Value Ref Range   Glucose-Capillary 202 (H) 70 - 99 mg/dL  Glucose, capillary     Status: Abnormal   Collection Time: 11/13/20  9:34 PM  Result Value Ref Range   Glucose-Capillary 156 (H) 70 - 99 mg/dL  C-reactive protein     Status: None   Collection Time: 11/14/20  4:17 AM  Result Value Ref Range   CRP 0.9 <1.0 mg/dL  Lactate dehydrogenase     Status: None   Collection Time: 11/14/20  4:17 AM  Result Value Ref Range   LDH 154 98 - 192 U/L  CBC with Differential/Platelet     Status: Abnormal   Collection Time: 11/14/20  4:17 AM  Result Value Ref Range   WBC 8.8 4.0 - 10.5 K/uL   RBC 4.71 3.87 - 5.11 MIL/uL   Hemoglobin 15.3 (H) 12.0 - 15.0 g/dL   HCT 45.9 36.0 - 46.0 %   MCV 97.5 80.0 - 100.0 fL   MCH 32.5 26.0 - 34.0 pg   MCHC 33.3 30.0 - 36.0 g/dL   RDW 11.6 11.5 - 15.5 %   Platelets 279 150 - 400 K/uL   nRBC 0.0 0.0 - 0.2 %   Neutrophils Relative % 81 %   Neutro Abs 7.1 1.7 - 7.7 K/uL   Lymphocytes Relative 10 %   Lymphs Abs 0.9 0.7 - 4.0 K/uL   Monocytes Relative 8 %   Monocytes Absolute 0.7 0.1 - 1.0 K/uL   Eosinophils Relative 0 %   Eosinophils Absolute 0.0 0.0 - 0.5 K/uL   Basophils Relative 0 %   Basophils Absolute 0.0 0.0 - 0.1 K/uL   Immature Granulocytes 1 %   Abs Immature Granulocytes 0.06 0.00 - 0.07 K/uL  Comprehensive metabolic panel     Status: Abnormal   Collection Time: 11/14/20  4:17 AM  Result Value Ref Range   Sodium 133 (L) 135 - 145 mmol/L   Potassium 3.7 3.5 - 5.1 mmol/L   Chloride 95 (L) 98 - 111 mmol/L   CO2 32 22 - 32 mmol/L   Glucose, Bld 238 (H) 70 - 99 mg/dL    BUN 16 6 - 20 mg/dL   Creatinine, Ser 0.58 0.44 - 1.00 mg/dL   Calcium 9.6 8.9 - 10.3 mg/dL   Total Protein 7.0 6.5 - 8.1 g/dL   Albumin 3.4 (L) 3.5 - 5.0 g/dL   AST 27 15 - 41 U/L   ALT 22 0 - 44 U/L   Alkaline Phosphatase 78 38 - 126 U/L   Total Bilirubin 0.4 0.3 - 1.2 mg/dL   GFR, Estimated >60 >60 mL/min   Anion gap 6 5 - 15  Magnesium     Status: None  Collection Time: 11/14/20  4:17 AM  Result Value Ref Range   Magnesium 2.1 1.7 - 2.4 mg/dL  Glucose, capillary     Status: Abnormal   Collection Time: 11/14/20  8:11 AM  Result Value Ref Range   Glucose-Capillary 123 (H) 70 - 99 mg/dL  Glucose, capillary     Status: Abnormal   Collection Time: 11/14/20 11:43 AM  Result Value Ref Range   Glucose-Capillary 281 (H) 70 - 99 mg/dL    Recent Results (from the past 240 hour(s))  Resp Panel by RT-PCR (Flu A&B, Covid) Nasopharyngeal Swab     Status: Abnormal   Collection Time: 11/11/20 12:08 AM   Specimen: Nasopharyngeal Swab; Nasopharyngeal(NP) swabs in vial transport medium  Result Value Ref Range Status   SARS Coronavirus 2 by RT PCR POSITIVE (A) NEGATIVE Final    Comment: RESULT CALLED TO, READ BACK BY AND VERIFIED WITH: TIA BULLOCK RN 0134 11/10/20 HNM (NOTE) SARS-CoV-2 target nucleic acids are DETECTED.  The SARS-CoV-2 RNA is generally detectable in upper respiratory specimens during the acute phase of infection. Positive results are indicative of the presence of the identified virus, but do not rule out bacterial infection or co-infection with other pathogens not detected by the test. Clinical correlation with patient history and other diagnostic information is necessary to determine patient infection status. The expected result is Negative.  Fact Sheet for Patients: EntrepreneurPulse.com.au  Fact Sheet for Healthcare Providers: IncredibleEmployment.be  This test is not yet approved or cleared by the [place] FDA and  has been  authorized for detection and/or diagnosis of SARS-CoV-2 by FDA under an Emergency Use Authorization (EUA).  This EUA will remain in effect (meaning this test can be u sed) for the duration of  the COVID-19 declaration under Section 564(b)(1) of the Act, 21 U.S.C. section 360bbb-3(b)(1), unless the authorization is terminated or revoked sooner.     Influenza A by PCR NEGATIVE NEGATIVE Final   Influenza B by PCR NEGATIVE NEGATIVE Final    Comment: (NOTE) The Xpert Xpress SARS-CoV-2/FLU/RSV plus assay is intended as an aid in the diagnosis of influenza from Nasopharyngeal swab specimens and should not be used as a sole basis for treatment. Nasal washings and aspirates are unacceptable for Xpert Xpress SARS-CoV-2/FLU/RSV testing.  Fact Sheet for Patients: EntrepreneurPulse.com.au  Fact Sheet for Healthcare Providers: IncredibleEmployment.be  This test is not yet approved or cleared by the [place] FDA and has been authorized for detection and/or diagnosis of SARS-CoV-2 by FDA under an Emergency Use Authorization (EUA). This EUA will remain in effect (meaning this test can be used) for the duration of the COVID-19 declaration under Section 564(b)(1) of the Act, 21 U.S.C. section 360bbb-3(b)(1), unless the authorization is terminated or revoked.  Performed at [facility], [address] [place], [place] [address]      Radiology Studies: No results found. CT Angio Chest PE W and/or Wo Contrast  Final Result    CT ABDOMEN PELVIS W CONTRAST  Final Result    DG Chest 2 View  Final Result      Scheduled Meds:  albuterol  2 puff Inhalation Q6H   clopidogrel  75 mg Oral Daily   enoxaparin (LOVENOX) injection  40 mg Subcutaneous [id number]   folic acid  1 mg Oral Daily   hydrochlorothiazide  25 mg Oral Daily   insulin aspart  0-5 Units Subcutaneous QHS   insulin aspart  0-9 Units Subcutaneous TID WC   LORazepam  0-4 mg  Intravenous  Q12H   Or   LORazepam  0-4 mg Oral Q12H   losartan  100 mg Oral Daily   metoprolol tartrate  25 mg Oral BID   multivitamin with minerals  1 tablet Oral Daily   nicotine  21 mg Transdermal Daily   oxybutynin  5 mg Oral QHS   pravastatin  40 mg Oral QHS   predniSONE  50 mg Oral Daily   thiamine  100 mg Oral Daily   Or   thiamine  100 mg Intravenous Daily   PRN Meds: acetaminophen, morphine injection, ondansetron **OR** ondansetron (ZOFRAN) IV, oxyCODONE Continuous Infusions:  remdesivir 100 mg in NS 100 mL 100 mg (11/14/20 0937)     LOS: 3 days  Time spent: Greater than 50% of the 35 minute visit was spent in counseling/coordination of care for the patient as laid out in the A&P.   Dwyane Dee, MD [facility] 11/14/2020, 12:45 PM

## 2020-11-14 NOTE — Progress Notes (Signed)
Inpatient Diabetes Program Recommendations  AACE/ADA: New Consensus Statement on Inpatient Glycemic Control (2015)  Target Ranges:  Prepandial:   less than 140 mg/dL      Peak postprandial:   less than 180 mg/dL (1-2 hours)      Critically ill patients:  140 - 180 mg/dL   Lab Results  Component Value Date   GLUCAP 281 (H) 11/14/2020   HGBA1C 6.6 (H) 11/11/2020    Review of Glycemic Control Results for Marie [name], Marie [name] (MRN [id number]) as of 11/14/2020 15:42  Ref. Range 11/13/2020 11:49 11/13/2020 16:27 11/13/2020 21:34 11/14/2020 08:11 11/14/2020 11:43  Glucose-Capillary Latest Ref Range: 70 - 99 mg/dL 211 (H) 202 (H) 156 (H) 123 (H) 281 (H)   Inpatient Diabetes Program Recommendations:   Please consider while on steroids. -Increase Novolog correction to moderate 0-15 units tid + hs 0-5 units Secure chat sent to Dr. Sabino Gasser.  Thank you, Nani Gasser. Lonisha Bobby, RN, MSN, CDE  Diabetes Coordinator Inpatient Glycemic Control Team Team Pager [phone] (8am-5pm) 11/14/2020 3:44 PM

## 2020-11-15 ENCOUNTER — Encounter: Payer: Self-pay | Admitting: Internal Medicine

## 2020-11-15 ENCOUNTER — Other Ambulatory Visit: Payer: Self-pay

## 2020-11-15 LAB — CBC WITH DIFFERENTIAL/PLATELET
Abs Immature Granulocytes: 0.07 10*3/uL (ref 0.00–0.07)
Basophils Absolute: 0 10*3/uL (ref 0.0–0.1)
Basophils Relative: 0 %
Eosinophils Absolute: 0 10*3/uL (ref 0.0–0.5)
Eosinophils Relative: 0 %
HCT: 46.7 % — ABNORMAL HIGH (ref 36.0–46.0)
Hemoglobin: 15.7 g/dL — ABNORMAL HIGH (ref 12.0–15.0)
Immature Granulocytes: 1 %
Lymphocytes Relative: 24 %
Lymphs Abs: 1.9 10*3/uL (ref 0.7–4.0)
MCH: 32.7 pg (ref 26.0–34.0)
MCHC: 33.6 g/dL (ref 30.0–36.0)
MCV: 97.3 fL (ref 80.0–100.0)
Monocytes Absolute: 1 10*3/uL (ref 0.1–1.0)
Monocytes Relative: 12 %
Neutro Abs: 5 10*3/uL (ref 1.7–7.7)
Neutrophils Relative %: 63 %
Platelets: 281 10*3/uL (ref 150–400)
RBC: 4.8 MIL/uL (ref 3.87–5.11)
RDW: 11.9 % (ref 11.5–15.5)
WBC: 7.9 10*3/uL (ref 4.0–10.5)
nRBC: 0 % (ref 0.0–0.2)

## 2020-11-15 LAB — GLUCOSE, CAPILLARY: Glucose-Capillary: 103 mg/dL — ABNORMAL HIGH (ref 70–99)

## 2020-11-15 LAB — COMPREHENSIVE METABOLIC PANEL
ALT: 26 U/L (ref 0–44)
AST: 39 U/L (ref 15–41)
Albumin: 3.6 g/dL (ref 3.5–5.0)
Alkaline Phosphatase: 76 U/L (ref 38–126)
Anion gap: 9 (ref 5–15)
BUN: 15 mg/dL (ref 6–20)
CO2: 31 mmol/L (ref 22–32)
Calcium: 9.3 mg/dL (ref 8.9–10.3)
Chloride: 98 mmol/L (ref 98–111)
Creatinine, Ser: 0.7 mg/dL (ref 0.44–1.00)
GFR, Estimated: >60 mL/min (ref >60)
Glucose, Bld: 108 mg/dL — ABNORMAL HIGH (ref 70–99)
Potassium: 3.5 mmol/L (ref 3.5–5.1)
Sodium: 138 mmol/L (ref 135–145)
Total Bilirubin: 0.6 mg/dL (ref 0.3–1.2)
Total Protein: 7.1 g/dL (ref 6.5–8.1)

## 2020-11-15 LAB — C-REACTIVE PROTEIN: CRP: 0.6 mg/dL (ref <1.0)

## 2020-11-15 LAB — LACTATE DEHYDROGENASE: LDH: 134 U/L (ref 98–192)

## 2020-11-15 LAB — MAGNESIUM: Magnesium: 2.2 mg/dL (ref 1.7–2.4)

## 2020-11-15 MED ORDER — PREDNISONE 20 MG PO TABS
40.0000 mg | ORAL_TABLET | Freq: Every day | ORAL | 0 refills | Status: AC
  Filled 2020-11-15: qty 10, 5d supply, fill #0

## 2020-11-15 NOTE — Discharge Summary (Signed)
Physician Discharge Summary   Marie [name] [id number] DOB: 20-Sep-1965 DOA: 11/10/2020  PCP: Gaynelle Arabian, MD  Admit date: 11/10/2020 Discharge date: 11/15/2020   Admitted From: home Disposition:  home Discharging physician: Dwyane Dee, MD  Recommendations for Outpatient Follow-up:  Continue routine care  Home Health:  Equipment/Devices:   Patient discharged to home in Discharge Condition: stable Risk of unplanned readmission score: Unplanned Admission- Pilot do not use: 9.64  CODE STATUS: Full Diet recommendation:  Diet Orders (From admission, onward)     Start     Ordered   11/15/20 0000  Diet - low sodium heart healthy        11/15/20 1015   11/11/20 0331  Diet heart healthy/carb modified Room service appropriate? Yes; Fluid consistency: Thin  Diet effective now       Question Answer Comment  Diet-HS Snack? Nothing   Room service appropriate? Yes   Fluid consistency: Thin      11/11/20 0332            Hospital Course: Marie [name] is a 55 yo female with PMH chronic pancreatitis, chronic abd pain, ongoing etoh use, HTN, OSA, sarcoidosis, DMII, CAD s/p CABG (2002) who presented to the ER with ongoing complaints of nonproductive cough and shortness of breath.  She started feeling poorly approximately 8 to 9 days prior to admission. Due to worsening pain and shortness of breath, she presented for further work-up. In the ER she was noted to be hypoxic requiring oxygen and was treated for her lower abdominal pain consistent with chronic pancreatitis. She was found to be positive for COVID-19 and was started on remdesivir and steroids.  See below for further A&P  * Pneumonia due to COVID-19 virus - approx day 8 of symptoms on admission -Completed 5 days remdesivir on 11/15/2020 - Discharged with steroids to complete 10-day course  Acute respiratory failure due to COVID-19 (HCC)-resolved as of 11/13/2020 - not on home O2 - wean as able; able to be weaned to  room air on 11/13/2020 - etiology multifactorial from covid but also emphysema noted on CTA chest from hx tobacco use  CAD in native artery - s/p CABG in 2002 per careeverywhere  - continue home meds  ETOH abuse - still drinking ~ 4 glass of wine daily - patient counseled on reducing and/or stopping consumption in setting of chronic pancreatitis   Essential hypertension - continue hctz and lopressor  - resume losartan   COPD (chronic obstructive pulmonary disease) ([place]) - continue breathing treatments - see above   Pulmonary sarcoidosis ([place]) - not on treatment   OSA (obstructive sleep apnea) - will clarify if uses home CPAP  Chronic pancreatitis ([place]) - no acute changes; abd pain is chronic and at baseline - continue pain control   Tobacco use - cessation counseling also given on admission   Diabetes mellitus with complication ([place]) - continue SSI and CBG monitoring     The patient's chronic medical conditions were treated accordingly per the patient's home medication regimen except as noted.  On day of discharge, patient was felt deemed stable for discharge. Patient/family member advised to call PCP or come back to ER if needed.   Principal Diagnosis: Pneumonia due to COVID-19 virus  Discharge Diagnoses: Active Hospital Problems   Diagnosis Date Noted   Pneumonia due to COVID-19 virus 11/11/2020    Priority: High   CAD in native artery 09/11/2018   Essential hypertension 08/28/2016   ETOH abuse 08/28/2016   COPD (chronic  obstructive pulmonary disease) ([place]) 08/13/2013   Pulmonary sarcoidosis ([place]) 03/24/2010   Chronic pancreatitis ([place]) 12/10/2008   Tobacco use 12/10/2008   OSA (obstructive sleep apnea) 12/02/2008   Diabetes mellitus with complication ([place]) [id number]    Resolved Hospital Problems   Diagnosis Date Noted Date Resolved   Acute respiratory failure due to COVID-19 [facility]) 11/11/2020 11/13/2020    Priority: High    Discharge Instructions      Diet - low sodium heart healthy   Complete by: As directed    Increase activity slowly   Complete by: As directed       Allergies as of 11/15/2020       Reactions   Penicillins Other (See Comments)   Unknown    Lisinopril Hives        Medication List     TAKE these medications    clopidogrel 75 MG tablet Commonly known as: PLAVIX TAKE 1 TABLET BY MOUTH ONCE DAILY TO THIN BLOOD   hydrochlorothiazide 25 MG tablet Commonly known as: HYDRODIURIL TAKE 1 TABLET BY MOUTH ONCE A DAY FOR BLOOD PRESSURE   ibuprofen 200 MG tablet Commonly known as: ADVIL Take 400 mg by mouth every 6 (six) hours as needed for headache or moderate pain.   losartan 100 MG tablet Commonly known as: COZAAR Take 1 tablet by mouth daily for blood pressure and kidney protection. (1 tablet Orally Once a day for BP and diabetic kidney protection 90 days)   metFORMIN 500 MG tablet Commonly known as: GLUCOPHAGE Take 1 tablet by mouth daily with breakfast. (1 tablet with breakfast Orally Once a day 90 days)   metoprolol tartrate 100 MG tablet Commonly known as: LOPRESSOR TAKE 1 TABLET BY MOUTH ONCE DAILY FOR BLOOD PRESSURE AND HEART   nitroGLYCERIN 0.4 MG SL tablet Commonly known as: Nitrostat Place 1 tablet (0.4 mg total) under the tongue every 5 (five) minutes as needed for chest pain. Maximum of 3 doses.   oxybutynin 5 MG 24 hr tablet Commonly known as: DITROPAN-XL Take 1 tablet (5 mg total) by mouth at bedtime.   Pfizer-BioNTech COVID-19 Vacc 30 MCG/0.3ML injection Generic drug: COVID-19 mRNA vaccine (Pfizer) USE AS DIRECTED   pravastatin 20 MG tablet Commonly known as: PRAVACHOL TAKE 1 TABLET BY MOUTH ONCE DAILY FOR CHOLESTEROL   predniSONE 20 MG tablet Commonly known as: DELTASONE Take 2 tablets (40 mg total) by mouth daily for 5 days.        Follow-up Information     Gaynelle Arabian, MD. Schedule an appointment as soon as possible for a visit in 2 week(s).   Specialty: Family  Medicine Why: May need BP regimen adjustment Office to call patient Contact information: [address], [place] [address] [phone]         Minna Merritts, MD. Go in 27 day(s).   Specialty: Cardiology Why: 12/12/20 8:25 am Contact information: [place] [address] [phone]                Allergies  Allergen Reactions   Penicillins Other (See Comments)    Unknown    Lisinopril Hives    Consultations:   Discharge Exam: BP 102/79 (BP Location: Left Arm)   Pulse 68   Temp (!) 97.5 F (36.4 C) (Oral)   Resp 18   Ht '5\' 2"'$  (1.575 m)   Wt 56.7 kg   SpO2 96%   BMI 22.86 kg/m  General appearance: alert, cooperative, and  no distress Head: Normocephalic, without obvious abnormality, atraumatic Eyes:  EOMI Lungs:  CTABL, no wheezing  Heart: regular rate and rhythm and S1, S2 normal Abdomen:  chronic Tenderness to palpation throughout, no rebound or guarding.  Bowel sounds present Extremities:  No edema Skin: mobility and turgor normal Neurologic: Grossly normal  The results of significant diagnostics from this hospitalization (including imaging, microbiology, ancillary and laboratory) are listed below for reference.   Microbiology: Recent Results (from the past 240 hour(s))  Resp Panel by RT-PCR (Flu A&B, Covid) Nasopharyngeal Swab     Status: Abnormal   Collection Time: 11/11/20 12:08 AM   Specimen: Nasopharyngeal Swab; Nasopharyngeal(NP) swabs in vial transport medium  Result Value Ref Range Status   SARS Coronavirus 2 by RT PCR POSITIVE (A) NEGATIVE Final    Comment: RESULT CALLED TO, READ BACK BY AND VERIFIED WITH: TIA BULLOCK RN 0134 11/10/20 HNM (NOTE) SARS-CoV-2 target nucleic acids are DETECTED.  The SARS-CoV-2 RNA is generally detectable in upper respiratory specimens during the acute phase of infection. Positive results are indicative of the presence of the identified virus, but do not  rule out bacterial infection or co-infection with other pathogens not detected by the test. Clinical correlation with patient history and other diagnostic information is necessary to determine patient infection status. The expected result is Negative.  Fact Sheet for Patients: EntrepreneurPulse.com.au  Fact Sheet for Healthcare Providers: IncredibleEmployment.be  This test is not yet approved or cleared by the [place] FDA and  has been authorized for detection and/or diagnosis of SARS-CoV-2 by FDA under an Emergency Use Authorization (EUA).  This EUA will remain in effect (meaning this test can be u sed) for the duration of  the COVID-19 declaration under Section 564(b)(1) of the Act, 21 U.S.C. section 360bbb-3(b)(1), unless the authorization is terminated or revoked sooner.     Influenza A by PCR NEGATIVE NEGATIVE Final   Influenza B by PCR NEGATIVE NEGATIVE Final    Comment: (NOTE) The Xpert Xpress SARS-CoV-2/FLU/RSV plus assay is intended as an aid in the diagnosis of influenza from Nasopharyngeal swab specimens and should not be used as a sole basis for treatment. Nasal washings and aspirates are unacceptable for Xpert Xpress SARS-CoV-2/FLU/RSV testing.  Fact Sheet for Patients: EntrepreneurPulse.com.au  Fact Sheet for Healthcare Providers: IncredibleEmployment.be  This test is not yet approved or cleared by the [place] FDA and has been authorized for detection and/or diagnosis of SARS-CoV-2 by FDA under an Emergency Use Authorization (EUA). This EUA will remain in effect (meaning this test can be used) for the duration of the COVID-19 declaration under Section 564(b)(1) of the Act, 21 U.S.C. section 360bbb-3(b)(1), unless the authorization is terminated or revoked.  Performed at [facility], [address] [place], [place] [address]      Labs: BNP (last 3  results) Recent Labs    11/11/20 0254  BNP [id number]*   Basic Metabolic Panel: Recent Labs  Lab 11/10/20 2251 11/11/20 0403 11/12/20 0418 11/13/20 0416 11/14/20 0417 11/15/20 0737  NA 137  --  137 135 133* 138  K 3.2*  --  4.4 3.8 3.7 3.5  CL 102  --  100 96* 95* 98  CO2 26  --  29 32 32 31  GLUCOSE 135*  --  189* 146* 238* 108*  BUN 8  --  '11 12 16 15  '$ CREATININE 0.64 0.59 0.67 0.72 0.58 0.70  CALCIUM 8.9  --  9.6 9.2 9.6 9.3  MG  --   --  1.9 1.9 2.1 2.2   Liver Function Tests: Recent Labs  Lab 11/10/20 2251 11/12/20 0418 11/13/20 0416 11/14/20 0417 11/15/20 0737  AST 78* '29 23 27 '$ 39  ALT '31 22 18 22 26  '$ ALKPHOS 82 74 70 78 76  BILITOT 0.5 0.4 0.6 0.4 0.6  PROT 7.6 6.5 6.8 7.0 7.1  ALBUMIN 3.8 3.2* 3.2* 3.4* 3.6   Recent Labs  Lab 11/10/20 2251  LIPASE 22   No results for input(s): AMMONIA in the last 168 hours. CBC: Recent Labs  Lab 11/10/20 2251 11/12/20 0418 11/13/20 0416 11/14/20 0417 11/15/20 0737  WBC 5.7 11.6* 10.2 8.8 7.9  NEUTROABS  --  9.7* 8.6* 7.1 5.0  HGB 13.8 13.0 14.1 15.3* 15.7*  HCT 41.1 39.2 42.4 45.9 46.7*  MCV 98.6 98.2 96.4 97.5 97.3  PLT 220 257 264 279 281   Cardiac Enzymes: No results for input(s): CKTOTAL, CKMB, CKMBINDEX, TROPONINI in the last 168 hours. BNP: Invalid input(s): POCBNP CBG: Recent Labs  Lab 11/14/20 1143 11/14/20 1629 11/14/20 1646 11/14/20 2111 11/15/20 0817  GLUCAP 281* 239* 232* 212* 103*   D-Dimer No results for input(s): DDIMER in the last 72 hours. Hgb A1c No results for input(s): HGBA1C in the last 72 hours. Lipid Profile No results for input(s): CHOL, HDL, LDLCALC, TRIG, CHOLHDL, LDLDIRECT in the last 72 hours. Thyroid function studies No results for input(s): TSH, T4TOTAL, T3FREE, THYROIDAB in the last 72 hours.  Invalid input(s): FREET3 Anemia work up No results for input(s): VITAMINB12, FOLATE, FERRITIN, TIBC, IRON, RETICCTPCT in the last 72 hours. Urinalysis    Component Value  Date/Time   COLORURINE STRAW (A) 11/10/2020 2251   APPEARANCEUR CLEAR (A) 11/10/2020 2251   LABSPEC 1.003 (L) 11/10/2020 2251   PHURINE 5.0 11/10/2020 2251   GLUCOSEU NEGATIVE 11/10/2020 2251   HGBUR NEGATIVE 11/10/2020 2251   HGBUR negative 03/16/2010 1107   BILIRUBINUR NEGATIVE 11/10/2020 2251   KETONESUR NEGATIVE 11/10/2020 2251   PROTEINUR NEGATIVE 11/10/2020 2251   UROBILINOGEN 1.0 10/12/2015 1910   NITRITE NEGATIVE 11/10/2020 2251   LEUKOCYTESUR NEGATIVE 11/10/2020 2251   Sepsis Labs Invalid input(s): PROCALCITONIN,  WBC,  LACTICIDVEN Microbiology Recent Results (from the past 240 hour(s))  Resp Panel by RT-PCR (Flu A&B, Covid) Nasopharyngeal Swab     Status: Abnormal   Collection Time: 11/11/20 12:08 AM   Specimen: Nasopharyngeal Swab; Nasopharyngeal(NP) swabs in vial transport medium  Result Value Ref Range Status   SARS Coronavirus 2 by RT PCR POSITIVE (A) NEGATIVE Final    Comment: RESULT CALLED TO, READ BACK BY AND VERIFIED WITH: TIA BULLOCK RN 0134 11/10/20 HNM (NOTE) SARS-CoV-2 target nucleic acids are DETECTED.  The SARS-CoV-2 RNA is generally detectable in upper respiratory specimens during the acute phase of infection. Positive results are indicative of the presence of the identified virus, but do not rule out bacterial infection or co-infection with other pathogens not detected by the test. Clinical correlation with patient history and other diagnostic information is necessary to determine patient infection status. The expected result is Negative.  Fact Sheet for Patients: EntrepreneurPulse.com.au  Fact Sheet for Healthcare Providers: IncredibleEmployment.be  This test is not yet approved or cleared by the [place] FDA and  has been authorized for detection and/or diagnosis of SARS-CoV-2 by FDA under an Emergency Use Authorization (EUA).  This EUA will remain in effect (meaning this test can be u sed) for the  duration of  the COVID-19 declaration under Section 564(b)(1) of the Act, 21 U.S.C. section  360bbb-3(b)(1), unless the authorization is terminated or revoked sooner.     Influenza A by PCR NEGATIVE NEGATIVE Final   Influenza B by PCR NEGATIVE NEGATIVE Final    Comment: (NOTE) The Xpert Xpress SARS-CoV-2/FLU/RSV plus assay is intended as an aid in the diagnosis of influenza from Nasopharyngeal swab specimens and should not be used as a sole basis for treatment. Nasal washings and aspirates are unacceptable for Xpert Xpress SARS-CoV-2/FLU/RSV testing.  Fact Sheet for Patients: EntrepreneurPulse.com.au  Fact Sheet for Healthcare Providers: IncredibleEmployment.be  This test is not yet approved or cleared by the [place] FDA and has been authorized for detection and/or diagnosis of SARS-CoV-2 by FDA under an Emergency Use Authorization (EUA). This EUA will remain in effect (meaning this test can be used) for the duration of the COVID-19 declaration under Section 564(b)(1) of the Act, 21 U.S.C. section 360bbb-3(b)(1), unless the authorization is terminated or revoked.  Performed at [facility], [address] [place], [place] [address]     Procedures/Studies: [place] Chest 2 View  Result Date: 11/10/2020 CLINICAL DATA:  Chest pains EXAM: CHEST - 2 VIEW COMPARISON:  03/09/2020 FINDINGS: Cardiac shadow is mildly enlarged but stable. Postsurgical changes are again seen and stable. Lungs are well aerated bilaterally. Scarring is noted in the left base. No acute abnormality is noted. No bony abnormality is seen. IMPRESSION: Chronic changes without acute abnormality Electronically Signed   By: Inez Catalina M.D.   On: 11/10/2020 23:16   CT Angio Chest PE W and/or Wo Contrast  Result Date: 11/11/2020 CLINICAL DATA:  Abdominal pain and shortness of breath. EXAM: CT ANGIOGRAPHY CHEST WITH CONTRAST TECHNIQUE: Multidetector CT imaging of the  chest was performed using the standard protocol during bolus administration of intravenous contrast. Multiplanar CT image reconstructions and MIPs were obtained to evaluate the vascular anatomy. CONTRAST:  110m OMNIPAQUE IOHEXOL 350 MG/ML SOLN COMPARISON:  Aug 13, 2013 FINDINGS: Cardiovascular: There is moderate severity calcification of the aortic arch, without evidence of aortic aneurysm or dissection. Satisfactory opacification of the pulmonary arteries to the segmental level. No evidence of pulmonary embolism. The main pulmonary artery is dilated and measures approximately 3.5 cm. Normal heart size with marked severity coronary artery calcification. No pericardial effusion. Mediastinum/Nodes: Multiple sternal wires are present. Mild partially calcified pretracheal and bilateral hilar lymphadenopathy is seen. Thyroid gland, trachea, and esophagus demonstrate no significant findings. Lungs/Pleura: Centrilobular emphysema is again seen involving predominantly the bilateral upper lobes. Predominant stable, mild to moderate severity areas of linear scarring and/or atelectasis are seen within the bilateral upper lobes. Moderate severity atelectasis and/or infiltrate is seen within the posterior aspect of the bilateral lung bases. A predominant stable area of patchy appearing focal scarring is seen within the posterior aspect of the left lung base. There is no evidence of a pleural effusion or pneumothorax. Upper Abdomen: Numerous small parenchymal calcifications are seen scattered throughout the pancreas. Musculoskeletal: Degenerative changes seen throughout the thoracic spine. Review of the MIP images confirms the above findings. IMPRESSION: 1. No evidence of pulmonary embolism. 2. Moderate severity posterior bibasilar atelectasis and/or infiltrate. 3. Centrilobular emphysema with mild to moderate severity bilateral upper lobe linear scarring and/or atelectasis. 4. Stable area of patchy appearing focal scarring  along the posterior aspect of the lung base. 5. Findings consistent with chronic pancreatitis. Electronically Signed   By: TVirgina NorfolkM.D.   On: 11/11/2020 01:41   CT ABDOMEN PELVIS W CONTRAST  Result Date: 11/11/2020 CLINICAL DATA:  Abdominal pain. EXAM: CT  ABDOMEN AND PELVIS WITH CONTRAST TECHNIQUE: Multidetector CT imaging of the abdomen and pelvis was performed using the standard protocol following bolus administration of intravenous contrast. CONTRAST:  152m OMNIPAQUE IOHEXOL 350 MG/ML SOLN COMPARISON:  June 13, 2019 FINDINGS: Lower chest: Mild-to-moderate severe atelectasis and/or infiltrate is seen within the posterior aspect of the bilateral lung bases. Chronic patchy scarring is also seen within the left lung base. Hepatobiliary: No focal liver abnormality is seen. Status post cholecystectomy. The common bile duct measures 1.4 cm. Pancreas: Innumerable small calcifications are seen throughout the pancreatic parenchyma. Spleen: Normal in size without focal abnormality. Adrenals/Urinary Tract: Adrenal glands are unremarkable. Kidneys are normal in size without obstructing renal calculi or focal lesions. Mild to moderate severity bilateral hydronephrosis and hydroureter are seen, right greater than left. The urinary bladder is moderately distended. Stomach/Bowel: Stomach is within normal limits. Appendix appears normal. Stool is seen throughout the large bowel. Air-filled loops of otherwise normal-appearing small bowel appear to approach the upper limit of normal and are seen within the mid and lower abdomen. Vascular/Lymphatic: Aortic atherosclerosis. No enlarged abdominal or pelvic lymph nodes. Reproductive: Multiple heterogeneous uterine fibroids are present. The bilateral adnexa are unremarkable Other: No abdominal wall hernia or abnormality. No abdominopelvic ascites. Musculoskeletal: No acute or significant osseous findings. IMPRESSION: 1. Mild to moderate severity bibasilar atelectasis  and/or infiltrate with stable, chronic patchy posterior left basilar scarring. 2. Findings consistent with chronic pancreatitis. 3. Small bowel loops which appear to approach the upper limit of normal caliber within the mid and lower abdomen. While this is likely transient in nature. Follow-up abdomen pelvis CT is recommended if early small-bowel obstruction is of clinical concern. 4. Moderately distended urinary bladder with subsequent bilateral hydronephrosis and hydroureter. 5. Multiple heterogeneous uterine fibroids. Electronically Signed   By: TVirgina NorfolkM.D.   On: 11/11/2020 01:48     Time coordinating discharge: Over 30 minutes    DDwyane Dee MD  [facility] 11/15/2020, 2:04 PM

## 2020-11-15 NOTE — Progress Notes (Signed)
Dwyane Dee, MD [facility] 11/15/2020, 10:18 AM

## 2020-11-17 ENCOUNTER — Other Ambulatory Visit: Payer: Self-pay

## 2020-11-17 MED ORDER — HYDROCODONE-ACETAMINOPHEN 5-325 MG PO TABS
ORAL_TABLET | ORAL | 0 refills | Status: DC
  Filled 2020-11-17: qty 24, 6d supply, fill #0

## 2020-11-30 ENCOUNTER — Other Ambulatory Visit: Payer: Self-pay

## 2020-11-30 MED ORDER — METFORMIN HCL 500 MG PO TABS
ORAL_TABLET | ORAL | 2 refills | Status: DC
  Filled 2020-11-30: qty 90, 90d supply, fill #0

## 2020-11-30 MED ORDER — METOPROLOL TARTRATE 100 MG PO TABS
ORAL_TABLET | ORAL | 2 refills | Status: DC
  Filled 2020-11-30: qty 90, 90d supply, fill #0

## 2020-11-30 MED ORDER — HYDROCHLOROTHIAZIDE 25 MG PO TABS
ORAL_TABLET | ORAL | 2 refills | Status: DC
  Filled 2020-11-30: qty 90, 90d supply, fill #0

## 2020-11-30 MED ORDER — LOSARTAN POTASSIUM 100 MG PO TABS
ORAL_TABLET | ORAL | 2 refills | Status: DC
  Filled 2020-11-30: qty 90, 90d supply, fill #0

## 2020-11-30 MED ORDER — CLOPIDOGREL BISULFATE 75 MG PO TABS
ORAL_TABLET | ORAL | 2 refills | Status: DC
  Filled 2020-11-30: qty 90, 90d supply, fill #0

## 2020-12-01 ENCOUNTER — Other Ambulatory Visit: Payer: Self-pay

## 2020-12-01 MED ORDER — ROSUVASTATIN CALCIUM 10 MG PO TABS
ORAL_TABLET | ORAL | 3 refills | Status: DC
  Filled 2020-12-01: qty 90, 90d supply, fill #0

## 2020-12-12 ENCOUNTER — Encounter: Payer: Self-pay | Admitting: Nurse Practitioner

## 2020-12-12 ENCOUNTER — Ambulatory Visit: Payer: 59 | Admitting: Nurse Practitioner

## 2020-12-12 NOTE — Progress Notes (Deleted)
Office Visit    Patient Name: Marie [name] Date of Encounter: 12/12/2020  Primary Care Provider:  Gaynelle Arabian, MD Primary Cardiologist:  Ida Rogue, MD  Chief Complaint    55 year old female with a history of CAD status post three-vessel bypass in July 2000, hypertension, hyperlipidemia, type 2 diabetes mellitus, pulmonary sarcoidosis, OSA, etoh abuse, and chronic pancreatitis, who presents for follow-up related to CAD.  Past Medical History    Past Medical History:  Diagnosis Date   CHF (congestive heart failure) (HCC)    Chronic pancreatitis (HCC)    Coronary atherosclerosis of unspecified type of vessel, native or graft    Headache(784.0)    "weekly" (07/29/2013)   Hypertension    Leiomyoma of uterus, unspecified    Loss of weight    Obesity, unspecified    OSA (obstructive sleep apnea)    no CPAP   Pure hypercholesterolemia    Sarcoidosis    Shortness of breath    none since stent 2 yrs ago   Type II diabetes mellitus ([place])    Past Surgical History:  Procedure Laterality Date   BREAST CYST ASPIRATION     does not remember which breast   BREAST EXCISIONAL BIOPSY     does not remember which breast   CORONARY ANGIOPLASTY WITH STENT PLACEMENT  09/2009   "1"   CORONARY ARTERY BYPASS GRAFT  ~ 2000   CABG X3   HYSTEROSCOPY WITH NOVASURE N/A 09/04/2012   Procedure: HYSTEROSCOPY WITH NOVASURE;  Surgeon: Mora Bellman, MD;  Location: [facility];  Service: Gynecology;  Laterality: N/A;  with removal intrauterine device   LEEP N/A 12/25/2018   Procedure: LOOP ELECTROSURGICAL EXCISION PROCEDURE (LEEP);  Surgeon: Gae Dry, MD;  Location: [facility];  Service: Gynecology;  Laterality: N/A;   ROBOTIC ASSISTED LAPAROSCOPIC CHOLECYSTECTOMY-MULTI SITE N/A 01/14/2018   Procedure: ROBOTIC ASSISTED LAPAROSCOPIC CHOLECYSTECTOMY-MULTI SITE;  Surgeon: Jules Husbands, MD;  Location: [facility];  Service: General;  Laterality: N/A;   TUBAL LIGATION  1997     Allergies  Allergies  Allergen Reactions   Penicillins Other (See Comments)    Unknown    Lisinopril Hives    History of Present Illness    55 year old female with above complex past medical history including coronary artery disease status post three-vessel bypass in July 2000 with subsequent PCI to the circumflex in May 2011.  Other history includes hypertension, hyperlipidemia, type 2 diabetes mellitus, pulmonary sarcoidosis, OSA, etoh abuse, and chronic pancreatitis.  She was last seen in cardiology clinic in June 2020, at which time she reported atypical left-sided chest pain.  Stress testing was performed and was low risk.  In August of this year, she was admitted to [facility] with COVID-19 infection and respiratory failure.  She completed 5 days of remdesivir and was subsequently discharged home on a 10-day steroid taper.  Since hospital discharge  Home Medications    Current Outpatient Medications  Medication Sig Dispense Refill   clopidogrel (PLAVIX) 75 MG tablet TAKE 1 TABLET BY MOUTH ONCE DAILY TO THIN BLOOD 90 tablet 2   clopidogrel (PLAVIX) 75 MG tablet 1 tablet Orally Once a day to thin blood 90 tablet 2   COVID-19 mRNA vaccine, Pfizer, 30 MCG/0.3ML injection USE AS DIRECTED .3 mL 0   hydrochlorothiazide (HYDRODIURIL) 25 MG tablet TAKE 1 TABLET BY MOUTH ONCE A DAY FOR BLOOD PRESSURE 90 tablet 0   hydrochlorothiazide (HYDRODIURIL) 25 MG tablet 1 tablet Orally once a day for blood pressure  90 tablet 2   HYDROcodone-acetaminophen (NORCO/VICODIN) 5-325 MG tablet 1 tablet Orally every 6 hrs as needed for pain 24 tablet 0   ibuprofen (ADVIL) 200 MG tablet Take 400 mg by mouth every 6 (six) hours as needed for headache or moderate pain.     losartan (COZAAR) 100 MG tablet 1 tablet Orally Once a day for BP and diabetic kidney protection 90 tablet 2   metFORMIN (GLUCOPHAGE) 500 MG tablet 1 tablet with breakfast Orally Once a day 90 tablet 2   metoprolol tartrate  (LOPRESSOR) 100 MG tablet TAKE 1 TABLET BY MOUTH ONCE DAILY FOR BLOOD PRESSURE AND HEART (Patient not taking: Reported on 11/11/2020) 90 tablet 2   metoprolol tartrate (LOPRESSOR) 100 MG tablet 1 tablet Orally Once a day for BP and heart 90 tablet 2   nitroGLYCERIN (NITROSTAT) 0.4 MG SL tablet Place 1 tablet (0.4 mg total) under the tongue every 5 (five) minutes as needed for chest pain. Maximum of 3 doses. 25 tablet 2   oxybutynin (DITROPAN-XL) 5 MG 24 hr tablet Take 1 tablet (5 mg total) by mouth at bedtime. 30 tablet 6   pravastatin (PRAVACHOL) 20 MG tablet TAKE 1 TABLET BY MOUTH ONCE DAILY FOR CHOLESTEROL 90 tablet 2   rosuvastatin (CRESTOR) 10 MG tablet 1 tablet Orally Once a day for cholesterol 90 tablet 3   No current facility-administered medications for this visit.     Review of Systems    ***.  All other systems reviewed and are otherwise negative except as noted above.  Physical Exam    VS:  There were no vitals taken for this visit. , BMI There is no height or weight on file to calculate BMI.     GEN: Well nourished, well developed, in no acute distress. HEENT: normal. Neck: Supple, no JVD, carotid bruits, or masses. Cardiac: RRR, no murmurs, rubs, or gallops. No clubbing, cyanosis, edema.  Radials/DP/PT 2+ and equal bilaterally.  Respiratory:  Respirations regular and unlabored, clear to auscultation bilaterally. GI: Soft, nontender, nondistended, BS + x 4. MS: no deformity or atrophy. Skin: warm and dry, no rash. Neuro:  Strength and sensation are intact. Psych: Normal affect.  Accessory Clinical Findings    ECG personally reviewed by me today - *** - no acute changes.  Lab Results  Component Value Date   WBC 7.9 11/15/2020   HGB 15.7 (H) 11/15/2020   HCT 46.7 (H) 11/15/2020   MCV 97.3 11/15/2020   PLT 281 11/15/2020   Lab Results  Component Value Date   CREATININE 0.70 11/15/2020   BUN 15 11/15/2020   NA 138 11/15/2020   K 3.5 11/15/2020   CL 98 11/15/2020    CO2 31 11/15/2020   Lab Results  Component Value Date   ALT 26 11/15/2020   AST 39 11/15/2020   ALKPHOS 76 11/15/2020   BILITOT 0.6 11/15/2020   Lab Results  Component Value Date   CHOL 210 (H) 03/22/2011   HDL 52 03/22/2011   LDLCALC 135 (H) 03/22/2011   TRIG 99 08/13/2012   CHOLHDL 4.0 03/22/2011    Lab Results  Component Value Date   HGBA1C 6.6 (H) 11/11/2020    Assessment & Plan    1.  ***   Murray Hodgkins, NP 12/12/2020, 7:22 AM

## 2020-12-13 ENCOUNTER — Encounter: Payer: Self-pay | Admitting: Nurse Practitioner

## 2020-12-15 ENCOUNTER — Other Ambulatory Visit: Payer: Self-pay

## 2020-12-15 MED ORDER — HYDROCODONE-ACETAMINOPHEN 5-325 MG PO TABS
ORAL_TABLET | ORAL | 0 refills | Status: DC
  Filled 2020-12-15: qty 24, 6d supply, fill #0

## 2021-01-12 ENCOUNTER — Other Ambulatory Visit: Payer: Self-pay

## 2021-01-12 MED ORDER — HYDROCODONE-ACETAMINOPHEN 5-325 MG PO TABS
ORAL_TABLET | ORAL | 0 refills | Status: DC
  Filled 2021-01-12: qty 24, 6d supply, fill #0

## 2021-02-03 ENCOUNTER — Other Ambulatory Visit: Payer: Self-pay

## 2021-02-06 ENCOUNTER — Other Ambulatory Visit: Payer: Self-pay

## 2021-02-10 ENCOUNTER — Other Ambulatory Visit: Payer: Self-pay

## 2021-02-10 MED ORDER — METFORMIN HCL 500 MG PO TABS
ORAL_TABLET | ORAL | 2 refills | Status: DC
  Filled 2021-02-10: qty 90, 90d supply, fill #0

## 2021-02-10 MED ORDER — HYDROCODONE-ACETAMINOPHEN 5-325 MG PO TABS
ORAL_TABLET | ORAL | 0 refills | Status: DC
  Filled 2021-02-10: qty 24, 6d supply, fill #0

## 2021-02-10 MED ORDER — METOPROLOL TARTRATE 100 MG PO TABS
ORAL_TABLET | ORAL | 2 refills | Status: DC
  Filled 2021-02-10: qty 90, 90d supply, fill #0

## 2021-02-10 MED ORDER — LOSARTAN POTASSIUM 100 MG PO TABS
ORAL_TABLET | ORAL | 2 refills | Status: DC
Start: 2021-02-10 — End: 2021-07-12
  Filled 2021-02-10: qty 90, 90d supply, fill #0

## 2021-02-10 MED ORDER — CLOPIDOGREL BISULFATE 75 MG PO TABS
ORAL_TABLET | ORAL | 2 refills | Status: DC
  Filled 2021-02-10: qty 90, 90d supply, fill #0

## 2021-02-10 MED ORDER — HYDROCHLOROTHIAZIDE 25 MG PO TABS
ORAL_TABLET | ORAL | 2 refills | Status: DC
  Filled 2021-02-10: qty 90, 90d supply, fill #0

## 2021-03-08 ENCOUNTER — Other Ambulatory Visit: Payer: Self-pay

## 2021-03-09 ENCOUNTER — Other Ambulatory Visit: Payer: Self-pay

## 2021-03-09 MED ORDER — HYDROCODONE-ACETAMINOPHEN 5-325 MG PO TABS
ORAL_TABLET | ORAL | 0 refills | Status: DC
Start: 2021-03-09 — End: 2021-04-07
  Filled 2021-03-09: qty 24, 4d supply, fill #0

## 2021-04-03 ENCOUNTER — Other Ambulatory Visit: Payer: Self-pay

## 2021-04-03 ENCOUNTER — Emergency Department
Admission: EM | Admit: 2021-04-03 | Discharge: 2021-04-04 | Disposition: A | Discharge status: Home / Self Care | Payer: 59 | Attending: Emergency Medicine | Admitting: Emergency Medicine

## 2021-04-03 DIAGNOSIS — I110 Hypertensive heart disease with heart failure: Secondary | ICD-10-CM | POA: Insufficient documentation

## 2021-04-03 DIAGNOSIS — Z955 Presence of coronary angioplasty implant and graft: Secondary | ICD-10-CM | POA: Insufficient documentation

## 2021-04-03 DIAGNOSIS — F10129 Alcohol abuse with intoxication, unspecified: Secondary | ICD-10-CM | POA: Insufficient documentation

## 2021-04-03 DIAGNOSIS — R4182 Altered mental status, unspecified: Secondary | ICD-10-CM | POA: Insufficient documentation

## 2021-04-03 DIAGNOSIS — I5030 Unspecified diastolic (congestive) heart failure: Secondary | ICD-10-CM | POA: Insufficient documentation

## 2021-04-03 DIAGNOSIS — F10929 Alcohol use, unspecified with intoxication, unspecified: Secondary | ICD-10-CM

## 2021-04-03 DIAGNOSIS — Z951 Presence of aortocoronary bypass graft: Secondary | ICD-10-CM | POA: Insufficient documentation

## 2021-04-03 DIAGNOSIS — E119 Type 2 diabetes mellitus without complications: Secondary | ICD-10-CM | POA: Diagnosis not present

## 2021-04-03 DIAGNOSIS — Z8541 Personal history of malignant neoplasm of cervix uteri: Secondary | ICD-10-CM | POA: Diagnosis not present

## 2021-04-03 DIAGNOSIS — Y908 Blood alcohol level of 240 mg/100 ml or more: Secondary | ICD-10-CM | POA: Insufficient documentation

## 2021-04-03 DIAGNOSIS — R1084 Generalized abdominal pain: Secondary | ICD-10-CM | POA: Diagnosis present

## 2021-04-03 DIAGNOSIS — K529 Noninfective gastroenteritis and colitis, unspecified: Secondary | ICD-10-CM | POA: Insufficient documentation

## 2021-04-03 DIAGNOSIS — Z20822 Contact with and (suspected) exposure to covid-19: Secondary | ICD-10-CM | POA: Diagnosis not present

## 2021-04-03 DIAGNOSIS — I2510 Atherosclerotic heart disease of native coronary artery without angina pectoris: Secondary | ICD-10-CM | POA: Diagnosis not present

## 2021-04-03 LAB — TROPONIN I (HIGH SENSITIVITY): Troponin I (High Sensitivity): 5 ng/L (ref <18)

## 2021-04-03 LAB — COMPREHENSIVE METABOLIC PANEL
ALT: 41 U/L (ref 0–44)
AST: 65 U/L — ABNORMAL HIGH (ref 15–41)
Albumin: 4 g/dL (ref 3.5–5.0)
Alkaline Phosphatase: 79 U/L (ref 38–126)
Anion gap: 9 (ref 5–15)
BUN: 11 mg/dL (ref 6–20)
CO2: 25 mmol/L (ref 22–32)
Calcium: 9.2 mg/dL (ref 8.9–10.3)
Chloride: 100 mmol/L (ref 98–111)
Creatinine, Ser: 0.57 mg/dL (ref 0.44–1.00)
GFR, Estimated: >60 mL/min (ref >60)
Glucose, Bld: 116 mg/dL — ABNORMAL HIGH (ref 70–99)
Potassium: 3.8 mmol/L (ref 3.5–5.1)
Sodium: 134 mmol/L — ABNORMAL LOW (ref 135–145)
Total Bilirubin: 0.6 mg/dL (ref 0.3–1.2)
Total Protein: 7.6 g/dL (ref 6.5–8.1)

## 2021-04-03 LAB — CBC WITH DIFFERENTIAL/PLATELET
Abs Immature Granulocytes: 0.04 10*3/uL (ref 0.00–0.07)
Basophils Absolute: 0 10*3/uL (ref 0.0–0.1)
Basophils Relative: 0 %
Eosinophils Absolute: 0 10*3/uL (ref 0.0–0.5)
Eosinophils Relative: 1 %
HCT: 47.1 % — ABNORMAL HIGH (ref 36.0–46.0)
Hemoglobin: 15.7 g/dL — ABNORMAL HIGH (ref 12.0–15.0)
Immature Granulocytes: 1 %
Lymphocytes Relative: 32 %
Lymphs Abs: 2.4 10*3/uL (ref 0.7–4.0)
MCH: 31.6 pg (ref 26.0–34.0)
MCHC: 33.3 g/dL (ref 30.0–36.0)
MCV: 94.8 fL (ref 80.0–100.0)
Monocytes Absolute: 0.7 10*3/uL (ref 0.1–1.0)
Monocytes Relative: 10 %
Neutro Abs: 4.1 10*3/uL (ref 1.7–7.7)
Neutrophils Relative %: 56 %
Platelets: 208 10*3/uL (ref 150–400)
RBC: 4.97 MIL/uL (ref 3.87–5.11)
RDW: 12 % (ref 11.5–15.5)
WBC: 7.3 10*3/uL (ref 4.0–10.5)
nRBC: 0 % (ref 0.0–0.2)

## 2021-04-03 LAB — CBG MONITORING, ED: Glucose-Capillary: 120 mg/dL — ABNORMAL HIGH (ref 70–99)

## 2021-04-03 LAB — LIPASE, BLOOD: Lipase: 23 U/L (ref 11–51)

## 2021-04-03 NOTE — ED Triage Notes (Signed)
Pt arrives via [facility] from home. EMS call out was for abdominal pain r/t pancreatitis. In transport pt became nauseated, vomited, and was altered. Per report pt was in and out of consciousness and was nonresponsive to sternal rub. Pt admits to consuming half a bottle of wine today.

## 2021-04-03 NOTE — ED Provider Notes (Signed)
[facility] Provider Note    Event Date/Time   First MD Initiated Contact with Patient 04/03/21 2304     (approximate)   History   Altered Mental Status   HPI  Marie [name] is a 56 y.o. female with a history of diastolic CHF, chronic pancreatitis, alcohol abuse, hypertension, hyperlipidemia, pulmonary sarcoidosis, diabetes who presents for evaluation of abdominal pain.  EMS was called out for generalized abdominal pain the patient has had for the last week.  She has had nausea and vomiting and diarrhea associated with it and fever.  In route to the emergency room patient became less responsive.  Upon arrival to the ED patient's eyes are open, she is tracking is in the room but not responding to questions or following commands.  Within 5 minutes of being in the room patient started to answer questions and follow commands.  She denies any drug use.  Reports drinking half a bottle of wine today.  Denies any trauma, headache, chest pain, shortness of breath.  She is complaining of 10 out of 10 generalized abdominal pain that is similar to prior episodes of pancreatitis.     Past Medical History:  Diagnosis Date   (HFpEF) heart failure with preserved ejection fraction ([place])    a. 10/2018 EF >65% by SPECT.   Chronic pancreatitis (HCC)    Coronary atherosclerosis of unspecified type of vessel, native or graft    a. 09/2018 s/p CABG x 3; b. 07/2009 s/p PCI-->LCX; 10/2018 MV: Low risk w/ small/mild apical inf/lateral reversible defect - likley artifact. EF >65%.   QBHALPFX(902.4)    "weekly" (07/29/2013)   Hyperlipidemia LDL goal <70    Hypertension    Leiomyoma of uterus, unspecified    Loss of weight    Obesity, unspecified    OSA (obstructive sleep apnea)    no CPAP   Pulmonary sarcoidosis (HCC)    Pure hypercholesterolemia    Shortness of breath    none since stent 2 yrs ago   Type II diabetes mellitus ([place])     Past Surgical History:  Procedure  Laterality Date   BREAST CYST ASPIRATION     does not remember which breast   BREAST EXCISIONAL BIOPSY     does not remember which breast   CORONARY ANGIOPLASTY WITH STENT PLACEMENT  09/2009   "1"   CORONARY ARTERY BYPASS GRAFT  ~ 2000   CABG X3   HYSTEROSCOPY WITH NOVASURE N/A 09/04/2012   Procedure: HYSTEROSCOPY WITH NOVASURE;  Surgeon: Mora Bellman, MD;  Location: [facility];  Service: Gynecology;  Laterality: N/A;  with removal intrauterine device   LEEP N/A 12/25/2018   Procedure: LOOP ELECTROSURGICAL EXCISION PROCEDURE (LEEP);  Surgeon: Gae Dry, MD;  Location: [facility];  Service: Gynecology;  Laterality: N/A;   ROBOTIC ASSISTED LAPAROSCOPIC CHOLECYSTECTOMY-MULTI SITE N/A 01/14/2018   Procedure: ROBOTIC ASSISTED LAPAROSCOPIC CHOLECYSTECTOMY-MULTI SITE;  Surgeon: Jules Husbands, MD;  Location: [facility];  Service: General;  Laterality: N/A;   TUBAL LIGATION  1997     Physical Exam   Triage Vital Signs: ED Triage Vitals  Enc Vitals Group     BP 04/03/21 2307 (!) 145/98     Pulse Rate 04/03/21 2310 (!) 108     Resp 04/03/21 2310 (!) 25     Temp 04/03/21 2310 98.4 F (36.9 C)     Temp Source 04/03/21 2310 Oral     SpO2 04/03/21 2310 99 %     Weight 04/03/21  2312 135 lb (61.2 kg)     Height 04/03/21 2312 5\' 2"  (1.575 m)     Head Circumference --      Peak Flow --      Pain Score 04/03/21 2312 10     Pain Loc --      Pain Edu? --      Excl. in [place]? --     Most recent vital signs: Vitals:   04/04/21 0300 04/04/21 0330  BP: 99/70 98/72  Pulse: 79 88  Resp: 15 15  Temp:    SpO2: 100% 99%     Constitutional: Alert and oriented. Well appearing and in no apparent distress. HEENT:      Head: Normocephalic and atraumatic.         Eyes: Conjunctivae are normal. Sclera is non-icteric.       Mouth/Throat: Mucous membranes are moist.       Neck: Supple with no signs of meningismus. Cardiovascular: Regular rate and rhythm. No murmurs, gallops, or rubs. 2+ symmetrical  distal pulses are present in all extremities.  Respiratory: Normal respiratory effort. Lungs are clear to auscultation bilaterally.  Gastrointestinal: Soft, diffusely tender to palpation, and non distended with positive bowel sounds. No rebound or guarding. Genitourinary: No CVA tenderness. Musculoskeletal:  No edema, cyanosis, or erythema of extremities. Neurologic: Normal speech and language. Face is symmetric. Moving all extremities. No gross focal neurologic deficits are appreciated. Skin: Skin is warm, dry and intact. No rash noted. Psychiatric: Mood and affect are normal. Speech and behavior are normal.  ED Results / Procedures / Treatments   Labs (all labs ordered are listed, but only abnormal results are displayed) Labs Reviewed  CBC WITH DIFFERENTIAL/PLATELET - Abnormal; Notable for the following components:      Result Value   Hemoglobin 15.7 (*)    HCT 47.1 (*)    All other components within normal limits  COMPREHENSIVE METABOLIC PANEL - Abnormal; Notable for the following components:   Sodium 134 (*)    Glucose, Bld 116 (*)    AST 65 (*)    All other components within normal limits  ETHANOL - Abnormal; Notable for the following components:   Alcohol, Ethyl (B) 390 (*)    All other components within normal limits  CBG MONITORING, ED - Abnormal; Notable for the following components:   Glucose-Capillary 120 (*)    All other components within normal limits  RESP PANEL BY RT-PCR (FLU A&B, COVID) ARPGX2  LIPASE, BLOOD  URINE DRUG SCREEN, QUALITATIVE ([facility] ONLY)  URINALYSIS, COMPLETE (UACMP) WITH MICROSCOPIC  TROPONIN I (HIGH SENSITIVITY)     EKG  ED ECG REPORT I, Rudene Re, the attending physician, personally viewed and interpreted this ECG.  Normal sinus rhythm with a rate of 90, normal intervals, normal axis, no ST elevations or depressions.   RADIOLOGY I have personally reviewed the images performed during this visit and interpreted as below:  CT head  negative for acute pathology.  CT abdomen pelvis with calcifications of the pancreas inflammation around the colon concerning for colitis    Interpretation by Radiologist:  CT Head Wo Contrast  Result Date: 04/04/2021 CLINICAL DATA:  Mental status changes, unknown cause. EXAM: CT HEAD WITHOUT CONTRAST TECHNIQUE: Contiguous axial images were obtained from the base of the skull through the vertex without intravenous contrast. COMPARISON:  Head CT 03/09/2020 FINDINGS: Brain: No evidence of acute infarction, hemorrhage, hydrocephalus, extra-axial collection or mass lesion/mass effect. Vascular: There calcifications in the distal right vertebral artery and  heavily in the siphons but no hyperdense central vessel is seen. Skull: Normal. Negative for fracture or focal lesion. Sinuses/Orbits: Included portions of the sinuses and mastoid air cells are clear. Maxillary sinuses are not in the scan. Other: None. IMPRESSION: No acute intracranial CT findings or interval changes. Carotid atherosclerosis. Electronically Signed   By: Telford Nab M.D.   On: 04/04/2021 00:51   CT ABDOMEN PELVIS W CONTRAST  Result Date: 04/04/2021 CLINICAL DATA:  Abdominal pain with suspected pancreatitis. EXAM: CT ABDOMEN AND PELVIS WITH CONTRAST TECHNIQUE: Multidetector CT imaging of the abdomen and pelvis was performed using the standard protocol following bolus administration of intravenous contrast. CONTRAST:  152mL OMNIPAQUE IOHEXOL 300 MG/ML  SOLN COMPARISON:  CT with IV contrast 11/11/2020, 01/20/2018. FINDINGS: Lower chest: There is chronic ground-glass interstitial disease and associated traction bronchiectasis in both lower lobes, lingular base. There is chronic fibrotic band in the posterior left lower lobe base. The cardiac size is normal. Hepatobiliary: No mass or ductal dilatation in the liver. Gallbladder is absent. Common bile duct is chronically dilated, today measuring 1.2 cm, previously 1.4 cm. Pancreas: Diffusely  atrophic and with innumerable coarse calcifications throughout its length. There is no mass enhancement common no appreciable ductal dilatation or peripancreatic edema. Spleen: Unremarkable. Adrenals/Urinary Tract: There is no adrenal mass. There are small left renal cysts. There are bilateral extrarenal pelves. No stones or hydronephrosis are seen. The bladder thickness is normal. Stomach/Bowel: There is fold thickening in the stomach, left upper to mid abdominal small bowel and fluid filling and mucosal enhancement in the normal caliber lower abdominal small bowel. The appendix is normal. Moderate stool retention noted ascending and transverse colon, with mild wall prominence or nondistention in the descending segment. There are sigmoid diverticula without evidence of diverticulitis. Vascular/Lymphatic: Moderate aortoiliac atherosclerosis. No enlarged abdominal or pelvic lymph nodes. Reproductive: Mildly lobular fibroid uterus is again noted. No adnexal mass is seen. Scattered pelvic phleboliths. Other: No free air, hemorrhage or fluid. Musculoskeletal: No acute or significant osseous findings. IMPRESSION: 1. Coarse chronic pancreatic calcifications consistent with chronic calcific pancreatitis. No acute peripancreatic edema is seen. 2. Gastroenteritis, with constipation and with descending colitis versus nondistention. Uncomplicated sigmoid diverticula. 3. Chronic ground-glass interstitial fibrosis in the lung bases with traction bronchiectasis. 4. Fibroid uterus. 5. Aortic atherosclerosis. Electronically Signed   By: Telford Nab M.D.   On: 04/04/2021 01:39      PROCEDURES:  Critical Care performed: No  Procedures    IMPRESSION / MDM / ASSESSMENT AND PLAN / ED COURSE  I reviewed the triage vital signs and the nursing notes.   56 y.o. female with a history of diastolic CHF, chronic pancreatitis, alcohol abuse, hypertension, hyperlipidemia, pulmonary sarcoidosis, diabetes who presents for  evaluation of abdominal pain.  Patient arrives complaining of 1 week of generalized severe abdominal pain, nausea and vomiting, diarrhea, and fever.  Positive EtOH daily.  Initially patient with her eyes open and tracking the room but would not respond verbally or follow commands.  Within 5 minutes of arrival in the room patient's mental status became normal.  Ddx: Abdominal pain: Pancreatitis versus diverticulitis versus bowel perforation versus SBO versus gastritis versus appendicitis versus colitis versus gastroenteritis  Confusion: Patient became confused in route to the emergency room.  No seizure-like activity.  Patient's complete neurologically intact at this time.  Alcohol intoxication versus narcotics intoxication versus hypoglycemia versus intracranial pathology   Plan: CBC, CMP, lipase, ethanol, UDS, CT head, CT abdomen pelvis.  Patient placed on telemetry  for close monitoring of cardiorespiratory status.   MEDICATIONS GIVEN IN ED: Medications  iohexol (OMNIPAQUE) 300 MG/ML solution 100 mL (100 mLs Intravenous Contrast Given 04/04/21 0032)  metroNIDAZOLE (FLAGYL) tablet 500 mg (500 mg Oral Given 04/04/21 0228)  cefTRIAXone (ROCEPHIN) 1 g in sodium chloride 0.9 % 100 mL IVPB (1 g Intravenous New Bag/Given 04/04/21 0229)  ondansetron (ZOFRAN) injection 4 mg (4 mg Intravenous Given 04/04/21 0232)  oxyCODONE-acetaminophen (PERCOCET/ROXICET) 5-325 MG per tablet 1 tablet (1 tablet Oral Given 04/04/21 0228)     ED COURSE: Normal white count, no anemia, mild hyperglycemia with no evidence of DKA, normal lipase.  Alcohol level of 390 with no signs of alcoholic ketoacidosis.  COVID and flu negative.  CT abdomen pelvis concerning for chronic pancreatitis and colitis.  Patient with allergies towards penicillin therefore she was given Flagyl and Rocephin.  Patient is tolerating p.o. no further episodes of vomiting here.  Her mental status is normal.  CT of the head negative for any acute pathology.   Admission was considered but felt unnecessary since patient's symptoms are well controlled, labs are within normal limits, CT does not show any pathology requiring surgery at this time and vitals remained within normal limits.  Plan to discharge home with a sober ride.  Discussed my standard return precautions for new or worsening pain, fever, or worsening symptoms.  Otherwise follow-up with PCP in 2 days.   Consults: none   EMR reviewed including last visit with primary care doctor from July 2021 when patient was seen for a UTI      FINAL CLINICAL IMPRESSION(S) / ED DIAGNOSES   Final diagnoses:  Colitis  Alcoholic intoxication with complication ([place])     Rx / DC Orders   ED Discharge Orders          Ordered    ondansetron (ZOFRAN-ODT) 4 MG disintegrating tablet  Every 8 hours PRN        04/04/21 0333    metroNIDAZOLE (FLAGYL) 500 MG tablet  3 times daily        04/04/21 0333    sulfamethoxazole-trimethoprim (BACTRIM DS) 800-160 MG tablet  2 times daily        04/04/21 7322             Note:  This document was prepared using Dragon voice recognition software and may include unintentional dictation errors.    Alfred Levins, [place], MD 04/04/21 [phone]

## 2021-04-04 ENCOUNTER — Other Ambulatory Visit: Payer: Self-pay

## 2021-04-04 ENCOUNTER — Encounter: Payer: Self-pay | Admitting: Radiology

## 2021-04-04 ENCOUNTER — Emergency Department: Payer: 59

## 2021-04-04 LAB — RESP PANEL BY RT-PCR (FLU A&B, COVID) ARPGX2
Influenza A by PCR: NEGATIVE
Influenza B by PCR: NEGATIVE
SARS Coronavirus 2 by RT PCR: NEGATIVE

## 2021-04-04 LAB — ETHANOL: Alcohol, Ethyl (B): 390 mg/dL (ref <10)

## 2021-04-04 MED ORDER — METRONIDAZOLE 500 MG PO TABS
500.0000 mg | ORAL_TABLET | Freq: Three times a day (TID) | ORAL | 0 refills | Status: AC
  Filled 2021-04-04: qty 42, 14d supply, fill #0

## 2021-04-04 MED ORDER — METRONIDAZOLE 500 MG PO TABS
500.0000 mg | ORAL_TABLET | Freq: Once | ORAL | Status: AC
  Administered 2021-04-04: 500 mg via ORAL
  Filled 2021-04-04: qty 1

## 2021-04-04 MED ORDER — OXYCODONE-ACETAMINOPHEN 5-325 MG PO TABS
1.0000 | ORAL_TABLET | Freq: Once | ORAL | Status: AC
  Administered 2021-04-04: 1 via ORAL
  Filled 2021-04-04: qty 1

## 2021-04-04 MED ORDER — ONDANSETRON HCL 4 MG/2ML IJ SOLN
4.0000 mg | Freq: Once | INTRAMUSCULAR | Status: AC
  Administered 2021-04-04: 4 mg via INTRAVENOUS
  Filled 2021-04-04: qty 2

## 2021-04-04 MED ORDER — SULFAMETHOXAZOLE-TRIMETHOPRIM 800-160 MG PO TABS
1.0000 | ORAL_TABLET | Freq: Two times a day (BID) | ORAL | 0 refills | Status: AC
  Filled 2021-04-04: qty 14, 7d supply, fill #0

## 2021-04-04 MED ORDER — IOHEXOL 300 MG/ML  SOLN
100.0000 mL | Freq: Once | INTRAMUSCULAR | Status: AC | PRN
  Administered 2021-04-04: 100 mL via INTRAVENOUS

## 2021-04-04 MED ORDER — SODIUM CHLORIDE 0.9 % IV SOLN
1.0000 g | Freq: Once | INTRAVENOUS | Status: AC
  Administered 2021-04-04: 1 g via INTRAVENOUS
  Filled 2021-04-04: qty 10

## 2021-04-04 MED ORDER — ONDANSETRON 4 MG PO TBDP
4.0000 mg | ORAL_TABLET | Freq: Three times a day (TID) | ORAL | 0 refills | Status: AC | PRN
  Filled 2021-04-04: qty 21, 7d supply, fill #0

## 2021-04-04 NOTE — Discharge Instructions (Signed)
You are diagnosed with colitis which is an inflammation of your large intestine.  This is treated with antibiotics.  Please make sure to take them fully.  You cannot drink alcohol while taking Flagyl as it may cause an allergic reaction.  Take Zofran as needed for nausea.  Tylenol for pain.  Follow-up with your primary care doctor in 2 days.  Return to the emergency room for worsening abdominal pain, fever, or worsening vomiting and diarrhea

## 2021-04-05 ENCOUNTER — Other Ambulatory Visit: Payer: Self-pay

## 2021-04-07 ENCOUNTER — Other Ambulatory Visit: Payer: Self-pay

## 2021-04-07 MED ORDER — HYDROCODONE-ACETAMINOPHEN 5-325 MG PO TABS
ORAL_TABLET | ORAL | 0 refills | Status: DC
  Filled 2021-04-07: qty 24, 6d supply, fill #0

## 2021-05-04 ENCOUNTER — Other Ambulatory Visit: Payer: Self-pay

## 2021-05-05 ENCOUNTER — Other Ambulatory Visit: Payer: Self-pay

## 2021-05-05 MED ORDER — HYDROCODONE-ACETAMINOPHEN 5-325 MG PO TABS
ORAL_TABLET | ORAL | 0 refills | Status: DC
  Filled 2021-05-05: qty 24, 6d supply, fill #0

## 2021-05-31 ENCOUNTER — Other Ambulatory Visit: Payer: Self-pay

## 2021-05-31 MED ORDER — METOPROLOL TARTRATE 100 MG PO TABS
ORAL_TABLET | ORAL | 0 refills | Status: DC
  Filled 2021-05-31: qty 90, 90d supply, fill #0

## 2021-05-31 MED ORDER — LOSARTAN POTASSIUM 100 MG PO TABS
ORAL_TABLET | ORAL | 0 refills | Status: DC
  Filled 2021-05-31: qty 90, 90d supply, fill #0

## 2021-05-31 MED ORDER — CLOPIDOGREL BISULFATE 75 MG PO TABS
ORAL_TABLET | ORAL | 0 refills | Status: DC
  Filled 2021-05-31: qty 90, 90d supply, fill #0

## 2021-05-31 MED ORDER — METFORMIN HCL 500 MG PO TABS
ORAL_TABLET | ORAL | 0 refills | Status: DC
  Filled 2021-05-31: qty 90, 90d supply, fill #0

## 2021-05-31 MED ORDER — HYDROCHLOROTHIAZIDE 25 MG PO TABS
ORAL_TABLET | ORAL | 0 refills | Status: DC
  Filled 2021-05-31: qty 90, 90d supply, fill #0

## 2021-05-31 MED ORDER — HYDROCODONE-ACETAMINOPHEN 5-325 MG PO TABS
ORAL_TABLET | ORAL | 0 refills | Status: DC
  Filled 2021-05-31: qty 24, 6d supply, fill #0

## 2021-06-21 ENCOUNTER — Other Ambulatory Visit: Payer: Self-pay

## 2021-06-21 MED ORDER — HYDROCHLOROTHIAZIDE 25 MG PO TABS: ORAL_TABLET | ORAL | 2 refills | Status: DC

## 2021-06-21 MED ORDER — LOSARTAN POTASSIUM 100 MG PO TABS: ORAL_TABLET | ORAL | 2 refills | Status: DC

## 2021-06-21 MED ORDER — METOPROLOL TARTRATE 100 MG PO TABS: ORAL_TABLET | ORAL | 2 refills | Status: DC

## 2021-06-21 MED ORDER — CLOPIDOGREL BISULFATE 75 MG PO TABS
ORAL_TABLET | ORAL | 2 refills | Status: DC
  Filled 2021-06-21: qty 90, 90d supply, fill #0

## 2021-06-21 MED ORDER — ROSUVASTATIN CALCIUM 10 MG PO TABS
ORAL_TABLET | ORAL | 2 refills | Status: DC
  Filled 2021-06-21: qty 90, 90d supply, fill #0

## 2021-06-21 MED ORDER — METFORMIN HCL 500 MG PO TABS: ORAL_TABLET | ORAL | 2 refills | Status: DC

## 2021-06-28 ENCOUNTER — Other Ambulatory Visit: Payer: Self-pay

## 2021-06-28 MED ORDER — HYDROCODONE-ACETAMINOPHEN 5-325 MG PO TABS
1.0000 | ORAL_TABLET | Freq: Four times a day (QID) | ORAL | 0 refills | Status: DC | PRN
  Filled 2021-06-28: qty 24, 6d supply, fill #0

## 2021-06-29 ENCOUNTER — Other Ambulatory Visit: Payer: Self-pay

## 2021-07-12 ENCOUNTER — Encounter: Payer: Self-pay | Admitting: Pulmonary Disease

## 2021-07-12 ENCOUNTER — Ambulatory Visit (INDEPENDENT_AMBULATORY_CARE_PROVIDER_SITE_OTHER): Payer: 59 | Admitting: Pulmonary Disease

## 2021-07-12 ENCOUNTER — Other Ambulatory Visit: Payer: Self-pay

## 2021-07-12 VITALS — BP 112/64 | HR 78 | Ht 62.0 in | Wt 111.2 lb

## 2021-07-12 DIAGNOSIS — F17210 Nicotine dependence, cigarettes, uncomplicated: Secondary | ICD-10-CM

## 2021-07-12 DIAGNOSIS — J432 Centrilobular emphysema: Secondary | ICD-10-CM | POA: Diagnosis not present

## 2021-07-12 DIAGNOSIS — G4733 Obstructive sleep apnea (adult) (pediatric): Secondary | ICD-10-CM | POA: Diagnosis not present

## 2021-07-12 MED ORDER — ANORO ELLIPTA 62.5-25 MCG/ACT IN AEPB
1.0000 | INHALATION_SPRAY | Freq: Every day | RESPIRATORY_TRACT | 6 refills | Status: DC
  Filled 2021-07-12 – 2021-07-27 (×2): qty 60, 30d supply, fill #0

## 2021-07-12 MED ORDER — ANORO ELLIPTA 62.5-25 MCG/ACT IN AEPB: 1.0000 | INHALATION_SPRAY | Freq: Every day | RESPIRATORY_TRACT | 0 refills | Status: DC

## 2021-07-12 NOTE — Progress Notes (Signed)
? ?Synopsis: Referred in April 2023 for Emphysema ? ?Subjective:  ? ?PATIENT ID: Marie [name] GENDER: female DOB: 08/06/1965, MRN: [id number] ? ? ?HPI ? ?Chief Complaint  ?Patient presents with  ? Consult  ?  Referred by PCP for possible emphysema while in the hospital a few months ago. Increased SOB and productive cough with white phlegm.   ? ?Marie [name] is a 56 year old woman, daily smoker with history of sarcoidosis, HFpEF, OSA not on CPAP, hypertension and DM II who is referred to pulmonary clinic for emphysema.  ? ?She was admitted for covid 19 10/2020 and CT Chest from that time showed mild centrilobular emphysema.  ? ?PFTs from 2015 showed moderate obstructive defect and severe diffusion defect.  ? ?She has dyspnea with exertion and has night time awakenings due to shortness of breath. She has history of sleep apnea and is not on CPAP due to insurance issues years ago. She is interested in having an updated sleep study to resume CPAP if needed. She has daily cough with sputum production in the mornings. She has intermittent wheezing. She denies issues with allergies. She has trouble with her breathing on hot humid days.  ? ?She is smoking half a pack per day. She has been smoking since age 25 and was previously smoking 1 pack per day. She works as a Set designer. She denies any harmful dust or chemical exposures. She lives with her husband and dog.  ?  ?Past Medical History:  ?Diagnosis Date  ? (HFpEF) heart failure with preserved ejection fraction ([place])   ? a. 10/2018 EF >65% by SPECT.  ? Chronic pancreatitis ([place])   ? Coronary atherosclerosis of unspecified type of vessel, native or graft   ? a. 09/2018 s/p CABG x 3; b. 07/2009 s/p PCI-->LCX; 10/2018 MV: Low risk w/ small/mild apical inf/lateral reversible defect - likley artifact. EF >65%.  ? Headache(784.0)   ? "weekly" (07/29/2013)  ? Hyperlipidemia LDL goal <70   ? Hypertension   ? Leiomyoma of uterus, unspecified   ? Loss of weight   ? Obesity,  unspecified   ? OSA (obstructive sleep apnea)   ? no CPAP  ? Pulmonary sarcoidosis ([place])   ? Pure hypercholesterolemia   ? Shortness of breath   ? none since stent 2 yrs ago  ? Type II diabetes mellitus ([place])   ?  ? ?Family History  ?Problem Relation Age of Onset  ? Diabetes Mother   ? Coronary artery disease Mother   ? Heart disease Mother   ? Hyperlipidemia Mother   ? Sudden death Mother   ? Colon cancer Maternal Grandfather   ? Liver disease Maternal Uncle   ? Heart disease Maternal Uncle   ? Heart disease Paternal Aunt   ? Stroke Maternal Grandmother   ? Breast cancer Neg Hx   ?  ? ?Social History  ? ?Socioeconomic History  ? Marital status: Married  ?  Spouse name: Not on file  ? Number of children: Not on file  ? Years of education: Not on file  ? Highest education level: Not on file  ?Occupational History  ? Occupation: Optometrist  ?Tobacco Use  ? Smoking status: Every Day  ?  Packs/day: 0.50  ?  Years: 32.00  ?  Pack years: 16.00  ?  Types: Cigarettes  ? Smokeless tobacco: Never  ?Vaping Use  ? Vaping Use: Never used  ?Substance and Sexual Activity  ? Alcohol use: Yes  ?  Alcohol/week:  24.0 standard drinks  ?  Types: 24 Glasses of wine per week  ?  Comment: 08/28/2016 "3-4 glasses of wine qd; last glass was last night",  ? Drug use: No  ? Sexual activity: Yes  ?  Birth control/protection: Surgical  ?Other Topics Concern  ? Not on file  ?Social History Narrative  ? Not on file  ? ?Social Determinants of Health  ? ?Financial Resource Strain: Not on file  ?Food Insecurity: Not on file  ?Transportation Needs: Not on file  ?Physical Activity: Not on file  ?Stress: Not on file  ?Social Connections: Not on file  ?Intimate Partner Violence: Not on file  ?  ? ?Allergies  ?Allergen Reactions  ? Penicillins Other (See Comments)  ?  Unknown   ? Lisinopril Hives  ?  ? ?Outpatient Medications Prior to Visit  ?Medication Sig Dispense Refill  ? clopidogrel (PLAVIX) 75 MG tablet 1 tablet Orally Once a day to thin blood 90  tablet 2  ? hydrochlorothiazide (HYDRODIURIL) 25 MG tablet 1 tablet Orally once a day for blood pressure 90 tablet 2  ? HYDROcodone-acetaminophen (NORCO/VICODIN) 5-325 MG tablet Take 1 tablet by mouth every 6 (six) hours as needed. 24 tablet 0  ? ibuprofen (ADVIL) 200 MG tablet Take 400 mg by mouth every 6 (six) hours as needed for headache or moderate pain.    ? losartan (COZAAR) 100 MG tablet 1 tablet Orally Once a day for BP and diabetic kidney protection 90 tablet 2  ? metFORMIN (GLUCOPHAGE) 500 MG tablet 1 tablet with breakfast Orally Once a day for diabetes 90 tablet 2  ? metoprolol tartrate (LOPRESSOR) 100 MG tablet 1 tablet Orally Once a day for BP and heart 90 tablet 2  ? nitroGLYCERIN (NITROSTAT) 0.4 MG SL tablet Place 1 tablet (0.4 mg total) under the tongue every 5 (five) minutes as needed for chest pain. Maximum of 3 doses. 25 tablet 2  ? rosuvastatin (CRESTOR) 10 MG tablet 1 tablet Orally Once a day for cholesterol 90 tablet 2  ? oxybutynin (DITROPAN-XL) 5 MG 24 hr tablet Take 1 tablet (5 mg total) by mouth at bedtime. 30 tablet 6  ? hydrochlorothiazide (HYDRODIURIL) 25 MG tablet TAKE 1 TABLET BY MOUTH ONCE A DAY FOR BLOOD PRESSURE 90 tablet 0  ? metoprolol tartrate (LOPRESSOR) 100 MG tablet TAKE 1 TABLET BY MOUTH ONCE DAILY FOR BLOOD PRESSURE AND HEART (Patient not taking: Reported on 11/11/2020) 90 tablet 2  ? clopidogrel (PLAVIX) 75 MG tablet 1 tablet Orally Once a day to thin blood 90 tablet 2  ? clopidogrel (PLAVIX) 75 MG tablet Take 1 tablet by mouth once a day to thin blood 90 tablet 0  ? clopidogrel (PLAVIX) 75 MG tablet 1 tablet Orally Once a day to thin blood 90 tablet 2  ? hydrochlorothiazide (HYDRODIURIL) 25 MG tablet 1 tablet Orally once a day for blood pressure 90 days 90 tablet 2  ? hydrochlorothiazide (HYDRODIURIL) 25 MG tablet 1 tablet Orally once a day for blood pressure 90 days 90 tablet 0  ? hydrochlorothiazide (HYDRODIURIL) 25 MG tablet 1 tablet Orally once a day for blood pressure  90 tablet 2  ? HYDROcodone-acetaminophen (NORCO/VICODIN) 5-325 MG tablet 1 tablet Orally every 6 hrs as needed for pain 24 tablet 0  ? losartan (COZAAR) 100 MG tablet 1 tablet Orally Once a day for BP and diabetic kidney protection 90 tablet 2  ? losartan (COZAAR) 100 MG tablet 1 tablet Orally Once a day for BP and diabetic  kidney protection 90 tablet 0  ? losartan (COZAAR) 100 MG tablet 1 tablet Orally Once a day for BP and diabetic kidney protection 90 tablet 2  ? metFORMIN (GLUCOPHAGE) 500 MG tablet 1 tablet with breakfast Orally Once a day 90 tablet 2  ? metFORMIN (GLUCOPHAGE) 500 MG tablet 1 tablet with breakfast Orally Once a day 90 tablet 2  ? metFORMIN (GLUCOPHAGE) 500 MG tablet 1 tablet with breakfast Orally Once a day 90 tablet 0  ? metoprolol tartrate (LOPRESSOR) 100 MG tablet 1 tablet Orally Once a day for BP and heart 90 tablet 2  ? metoprolol tartrate (LOPRESSOR) 100 MG tablet 1 tablet Orally Once a day for BP and heart 90 tablet 0  ? metoprolol tartrate (LOPRESSOR) 100 MG tablet 1 tablet Orally Once a day for BP and heart 90 tablet 2  ? pravastatin (PRAVACHOL) 20 MG tablet TAKE 1 TABLET BY MOUTH ONCE DAILY FOR CHOLESTEROL 90 tablet 2  ? rosuvastatin (CRESTOR) 10 MG tablet 1 tablet Orally Once a day for cholesterol 90 tablet 3  ? ?No facility-administered medications prior to visit.  ? ?Review of Systems  ?Constitutional:  Negative for chills, fever, malaise/fatigue and weight loss.  ?HENT:  Negative for congestion, sinus pain and sore throat.   ?Eyes: Negative.   ?Respiratory:  Positive for cough, sputum production, shortness of breath and wheezing. Negative for hemoptysis.   ?Cardiovascular:  Negative for chest pain, palpitations, orthopnea, claudication and leg swelling.  ?Gastrointestinal:  Negative for abdominal pain, heartburn, nausea and vomiting.  ?Genitourinary: Negative.   ?Musculoskeletal:  Negative for joint pain and myalgias.  ?Skin:  Negative for rash.  ?Neurological:  Negative for  weakness.  ?Endo/Heme/Allergies: Negative.   ?Psychiatric/Behavioral: Negative.    ? ?Objective:  ? ?Vitals:  ? 07/12/21 0857  ?BP: 112/64  ?Pulse: 78  ?SpO2: 95%  ?Weight: 111 lb 3.2 oz (50.4 kg)  ?Height: '5\' 2"'$  (1.575

## 2021-07-12 NOTE — Addendum Note (Signed)
Addended by: Valerie Salts on: 07/12/2021 10:54 AM ? ? Modules accepted: Orders ? ?

## 2021-07-12 NOTE — Patient Instructions (Addendum)
We will check a home sleep study based on your history of sleep apnea. ? ?Start anoro ellipta 1 puff daily ? ?Use albuterol inhaler 1-2 puffs every 4-6 hours as needed for shortness of breath, cough or wheezing.  ? ?For smoking cessation: ?- Use nicotine patches '7mg'$  daily and mini nicotine lozenges as needed for break through cravings.  ? ?Follow up in 3 months with pulmonary function tests ?

## 2021-07-20 ENCOUNTER — Other Ambulatory Visit: Payer: Self-pay

## 2021-07-20 ENCOUNTER — Emergency Department: Payer: 59

## 2021-07-20 ENCOUNTER — Observation Stay
Admission: EM | Admit: 2021-07-20 | Discharge: 2021-07-21 | Disposition: A | Discharge status: Home / Self Care | Payer: 59 | Attending: Internal Medicine | Admitting: Internal Medicine

## 2021-07-20 ENCOUNTER — Encounter: Payer: Self-pay | Admitting: Emergency Medicine

## 2021-07-20 DIAGNOSIS — I2510 Atherosclerotic heart disease of native coronary artery without angina pectoris: Secondary | ICD-10-CM | POA: Insufficient documentation

## 2021-07-20 DIAGNOSIS — F17210 Nicotine dependence, cigarettes, uncomplicated: Secondary | ICD-10-CM | POA: Insufficient documentation

## 2021-07-20 DIAGNOSIS — Z951 Presence of aortocoronary bypass graft: Secondary | ICD-10-CM | POA: Insufficient documentation

## 2021-07-20 DIAGNOSIS — I110 Hypertensive heart disease with heart failure: Secondary | ICD-10-CM | POA: Diagnosis not present

## 2021-07-20 DIAGNOSIS — I5033 Acute on chronic diastolic (congestive) heart failure: Secondary | ICD-10-CM

## 2021-07-20 DIAGNOSIS — J189 Pneumonia, unspecified organism: Secondary | ICD-10-CM | POA: Diagnosis not present

## 2021-07-20 DIAGNOSIS — D860 Sarcoidosis of lung: Secondary | ICD-10-CM | POA: Diagnosis present

## 2021-07-20 DIAGNOSIS — R0602 Shortness of breath: Secondary | ICD-10-CM | POA: Diagnosis present

## 2021-07-20 DIAGNOSIS — E118 Type 2 diabetes mellitus with unspecified complications: Secondary | ICD-10-CM | POA: Diagnosis present

## 2021-07-20 DIAGNOSIS — Z720 Tobacco use: Secondary | ICD-10-CM | POA: Diagnosis present

## 2021-07-20 DIAGNOSIS — F1010 Alcohol abuse, uncomplicated: Secondary | ICD-10-CM | POA: Diagnosis present

## 2021-07-20 DIAGNOSIS — Z79899 Other long term (current) drug therapy: Secondary | ICD-10-CM | POA: Insufficient documentation

## 2021-07-20 DIAGNOSIS — E119 Type 2 diabetes mellitus without complications: Secondary | ICD-10-CM | POA: Insufficient documentation

## 2021-07-20 DIAGNOSIS — K861 Other chronic pancreatitis: Secondary | ICD-10-CM | POA: Diagnosis present

## 2021-07-20 DIAGNOSIS — R7401 Elevation of levels of liver transaminase levels: Secondary | ICD-10-CM | POA: Diagnosis present

## 2021-07-20 DIAGNOSIS — I5032 Chronic diastolic (congestive) heart failure: Secondary | ICD-10-CM

## 2021-07-20 DIAGNOSIS — K860 Alcohol-induced chronic pancreatitis: Secondary | ICD-10-CM | POA: Diagnosis not present

## 2021-07-20 DIAGNOSIS — J449 Chronic obstructive pulmonary disease, unspecified: Secondary | ICD-10-CM | POA: Diagnosis not present

## 2021-07-20 DIAGNOSIS — Z7984 Long term (current) use of oral hypoglycemic drugs: Secondary | ICD-10-CM | POA: Insufficient documentation

## 2021-07-20 LAB — TROPONIN I (HIGH SENSITIVITY)
Troponin I (High Sensitivity): 3 ng/L (ref <18)
Troponin I (High Sensitivity): 4 ng/L (ref <18)

## 2021-07-20 LAB — CBC
HCT: 48.3 % — ABNORMAL HIGH (ref 36.0–46.0)
Hemoglobin: 15.4 g/dL — ABNORMAL HIGH (ref 12.0–15.0)
MCH: 31.2 pg (ref 26.0–34.0)
MCHC: 31.9 g/dL (ref 30.0–36.0)
MCV: 97.8 fL (ref 80.0–100.0)
Platelets: 192 10*3/uL (ref 150–400)
RBC: 4.94 MIL/uL (ref 3.87–5.11)
RDW: 12.6 % (ref 11.5–15.5)
WBC: 3.7 10*3/uL — ABNORMAL LOW (ref 4.0–10.5)
nRBC: 0 % (ref 0.0–0.2)

## 2021-07-20 LAB — BASIC METABOLIC PANEL
Anion gap: 8 (ref 5–15)
BUN: 6 mg/dL (ref 6–20)
CO2: 26 mmol/L (ref 22–32)
Calcium: 9.1 mg/dL (ref 8.9–10.3)
Chloride: 106 mmol/L (ref 98–111)
Creatinine, Ser: 0.71 mg/dL (ref 0.44–1.00)
GFR, Estimated: >60 mL/min (ref >60)
Glucose, Bld: 90 mg/dL (ref 70–99)
Potassium: 4.5 mmol/L (ref 3.5–5.1)
Sodium: 140 mmol/L (ref 135–145)

## 2021-07-20 LAB — LIPASE, BLOOD: Lipase: 21 U/L (ref 11–51)

## 2021-07-20 LAB — HEPATIC FUNCTION PANEL
ALT: 59 U/L — ABNORMAL HIGH (ref 0–44)
AST: 136 U/L — ABNORMAL HIGH (ref 15–41)
Albumin: 3.7 g/dL (ref 3.5–5.0)
Alkaline Phosphatase: 105 U/L (ref 38–126)
Bilirubin, Direct: 0.4 mg/dL — ABNORMAL HIGH (ref 0.0–0.2)
Indirect Bilirubin: 0.6 mg/dL (ref 0.3–0.9)
Total Bilirubin: 1 mg/dL (ref 0.3–1.2)
Total Protein: 7.6 g/dL (ref 6.5–8.1)

## 2021-07-20 LAB — MAGNESIUM: Magnesium: 1.6 mg/dL — ABNORMAL LOW (ref 1.7–2.4)

## 2021-07-20 LAB — GLUCOSE, CAPILLARY: Glucose-Capillary: 146 mg/dL — ABNORMAL HIGH (ref 70–99)

## 2021-07-20 MED ORDER — SODIUM CHLORIDE 0.9 % IV SOLN
500.0000 mg | Freq: Once | INTRAVENOUS | Status: AC
  Administered 2021-07-20: 500 mg via INTRAVENOUS
  Filled 2021-07-20: qty 5

## 2021-07-20 MED ORDER — ONDANSETRON HCL 4 MG/2ML IJ SOLN
4.0000 mg | Freq: Once | INTRAMUSCULAR | Status: AC
  Administered 2021-07-20: 4 mg via INTRAVENOUS
  Filled 2021-07-20: qty 2

## 2021-07-20 MED ORDER — FOLIC ACID 1 MG PO TABS
1.0000 mg | ORAL_TABLET | Freq: Every day | ORAL | Status: DC
  Administered 2021-07-21: 1 mg via ORAL
  Filled 2021-07-20: qty 1

## 2021-07-20 MED ORDER — SODIUM CHLORIDE 0.9% FLUSH
3.0000 mL | Freq: Two times a day (BID) | INTRAVENOUS | Status: DC
  Administered 2021-07-20 – 2021-07-21 (×3): 3 mL via INTRAVENOUS

## 2021-07-20 MED ORDER — SODIUM CHLORIDE 0.9 % IV SOLN: 500.0000 mg | INTRAVENOUS | Status: DC

## 2021-07-20 MED ORDER — METOPROLOL TARTRATE 50 MG PO TABS
100.0000 mg | ORAL_TABLET | Freq: Every day | ORAL | Status: DC
  Administered 2021-07-20 – 2021-07-21 (×2): 100 mg via ORAL
  Filled 2021-07-20: qty 4
  Filled 2021-07-20: qty 2

## 2021-07-20 MED ORDER — ONDANSETRON HCL 4 MG PO TABS: 4.0000 mg | ORAL_TABLET | Freq: Four times a day (QID) | ORAL | Status: DC | PRN

## 2021-07-20 MED ORDER — MORPHINE SULFATE (PF) 2 MG/ML IV SOLN
1.0000 mg | INTRAVENOUS | Status: DC | PRN
  Administered 2021-07-20 – 2021-07-21 (×4): 1 mg via INTRAVENOUS
  Filled 2021-07-20 (×4): qty 1

## 2021-07-20 MED ORDER — SODIUM CHLORIDE 0.9 % IV SOLN: 2.0000 g | INTRAVENOUS | Status: DC

## 2021-07-20 MED ORDER — SODIUM CHLORIDE 0.9 % IV SOLN: 250.0000 mL | INTRAVENOUS | Status: DC | PRN

## 2021-07-20 MED ORDER — ACETAMINOPHEN 650 MG RE SUPP
650.0000 mg | Freq: Four times a day (QID) | RECTAL | Status: DC | PRN
Start: 2021-07-20 — End: 2021-07-21

## 2021-07-20 MED ORDER — THIAMINE HCL 100 MG PO TABS
100.0000 mg | ORAL_TABLET | Freq: Every day | ORAL | Status: DC
  Administered 2021-07-21: 100 mg via ORAL
  Filled 2021-07-20: qty 1

## 2021-07-20 MED ORDER — ENOXAPARIN SODIUM 40 MG/0.4ML IJ SOSY
40.0000 mg | PREFILLED_SYRINGE | INTRAMUSCULAR | Status: DC
  Administered 2021-07-20: 40 mg via SUBCUTANEOUS
  Filled 2021-07-20: qty 0.4

## 2021-07-20 MED ORDER — THIAMINE HCL 100 MG/ML IJ SOLN: 100.0000 mg | Freq: Every day | INTRAMUSCULAR | Status: DC

## 2021-07-20 MED ORDER — SODIUM CHLORIDE 0.9 % IV SOLN
2.0000 g | Freq: Once | INTRAVENOUS | Status: AC
  Administered 2021-07-20: 2 g via INTRAVENOUS
  Filled 2021-07-20: qty 20

## 2021-07-20 MED ORDER — LORAZEPAM 1 MG PO TABS: 1.0000 mg | ORAL_TABLET | ORAL | Status: DC | PRN

## 2021-07-20 MED ORDER — LOSARTAN POTASSIUM 50 MG PO TABS: 50.0000 mg | ORAL_TABLET | Freq: Every day | ORAL | Status: DC

## 2021-07-20 MED ORDER — PANTOPRAZOLE SODIUM 40 MG IV SOLR
40.0000 mg | Freq: Once | INTRAVENOUS | Status: AC
  Administered 2021-07-20: 40 mg via INTRAVENOUS
  Filled 2021-07-20: qty 10

## 2021-07-20 MED ORDER — IPRATROPIUM-ALBUTEROL 0.5-2.5 (3) MG/3ML IN SOLN
3.0000 mL | Freq: Four times a day (QID) | RESPIRATORY_TRACT | Status: DC | PRN
Start: 2021-07-20 — End: 2021-07-21

## 2021-07-20 MED ORDER — LORAZEPAM 2 MG/ML IJ SOLN: 1.0000 mg | INTRAMUSCULAR | Status: DC | PRN

## 2021-07-20 MED ORDER — PROCHLORPERAZINE EDISYLATE 10 MG/2ML IJ SOLN
10.0000 mg | Freq: Once | INTRAMUSCULAR | Status: AC
  Administered 2021-07-20: 10 mg via INTRAVENOUS
  Filled 2021-07-20: qty 2

## 2021-07-20 MED ORDER — ACETAMINOPHEN 325 MG PO TABS
650.0000 mg | ORAL_TABLET | Freq: Four times a day (QID) | ORAL | Status: DC | PRN
  Administered 2021-07-21: 650 mg via ORAL
  Filled 2021-07-20: qty 2

## 2021-07-20 MED ORDER — ONDANSETRON HCL 4 MG/2ML IJ SOLN
4.0000 mg | Freq: Four times a day (QID) | INTRAMUSCULAR | Status: DC | PRN
  Administered 2021-07-20 – 2021-07-21 (×2): 4 mg via INTRAVENOUS
  Filled 2021-07-20 (×4): qty 2

## 2021-07-20 MED ORDER — ROSUVASTATIN CALCIUM 10 MG PO TABS
10.0000 mg | ORAL_TABLET | Freq: Every day | ORAL | Status: DC
  Administered 2021-07-21: 10 mg via ORAL
  Filled 2021-07-20: qty 1

## 2021-07-20 MED ORDER — ADULT MULTIVITAMIN W/MINERALS CH
1.0000 | ORAL_TABLET | Freq: Every day | ORAL | Status: DC
  Administered 2021-07-21: 1 via ORAL
  Filled 2021-07-20: qty 1

## 2021-07-20 MED ORDER — UMECLIDINIUM-VILANTEROL 62.5-25 MCG/ACT IN AEPB
1.0000 | INHALATION_SPRAY | Freq: Every day | RESPIRATORY_TRACT | Status: DC
  Administered 2021-07-21: 1 via RESPIRATORY_TRACT
  Filled 2021-07-20: qty 14

## 2021-07-20 MED ORDER — SODIUM CHLORIDE 0.9% FLUSH
3.0000 mL | INTRAVENOUS | Status: DC | PRN
Start: 2021-07-20 — End: 2021-07-21

## 2021-07-20 MED ORDER — KETOROLAC TROMETHAMINE 15 MG/ML IJ SOLN
15.0000 mg | Freq: Once | INTRAMUSCULAR | Status: AC
  Administered 2021-07-20: 15 mg via INTRAVENOUS
  Filled 2021-07-20: qty 1

## 2021-07-20 MED ORDER — LACTATED RINGERS IV BOLUS
1000.0000 mL | Freq: Once | INTRAVENOUS | Status: AC
  Administered 2021-07-20: 1000 mL via INTRAVENOUS

## 2021-07-20 MED ORDER — NITROGLYCERIN 0.4 MG SL SUBL: 0.4000 mg | SUBLINGUAL_TABLET | SUBLINGUAL | Status: DC | PRN

## 2021-07-20 MED ORDER — CLOPIDOGREL BISULFATE 75 MG PO TABS
75.0000 mg | ORAL_TABLET | Freq: Every day | ORAL | Status: DC
  Administered 2021-07-21: 75 mg via ORAL
  Filled 2021-07-20: qty 1

## 2021-07-20 NOTE — ED Notes (Signed)
[facility] states pt's chart currently locked so blood sent to lab with white label.  ?

## 2021-07-20 NOTE — Assessment & Plan Note (Signed)
Not acutely exacerbated ?Continue metoprolol and losartan ?

## 2021-07-20 NOTE — ED Notes (Signed)
LR bolus restarted as antibiotic finished.  ?

## 2021-07-20 NOTE — Assessment & Plan Note (Signed)
Patient has a known history of chronic pancreatitis most likely related to alcohol abuse ?She has chronic abdominal pain ?Placed on as needed IV morphine ?

## 2021-07-20 NOTE — Assessment & Plan Note (Signed)
Hold metformin while patient is hospitalized ?Maintain consistent carbohydrate diet ?Check blood sugars with meals ?Sliding scale insulin for glycemic control ?

## 2021-07-20 NOTE — ED Notes (Signed)
Pt received lunch tray from dietary staff. ?

## 2021-07-20 NOTE — ED Notes (Signed)
Iv fluids infusing.  Pt alert, watching tv ?

## 2021-07-20 NOTE — ED Notes (Signed)
This RN ambulated pt. In room with pulse ox. Pt. Sat was 90% throughout ambulation. MD notified. ?

## 2021-07-20 NOTE — ED Notes (Signed)
Pt given remote to tv and warm blankets. Provider Agbata notified via secure chat that pt continues to report 10/10 abdominal and back pain and is requesting to try something else for her pain. Awaiting orders. ?

## 2021-07-20 NOTE — ED Provider Notes (Signed)
? ?[facility] ?Provider Note ? ? ? Event Date/Time  ? First MD Initiated Contact with Patient 07/20/21 [phone]   ?  (approximate) ? ? ?History  ? ?Chest Pain and Shortness of Breath ? ? ?HPI ? ?Marie [name] is a 56 y.o. female with a history of pancreatitis, sarcoidosis, diabetes, COPD, alcohol abuse who comes ED complaining of central chest discomfort and shortness of breath for the last 24 hours, increased cough.  Also complains of some upper abdominal pain and diarrhea.  No exertional symptoms, nonradiating, not tearing. ?  ? ? ?Physical Exam  ? ?Triage Vital Signs: ?ED Triage Vitals  ?Enc Vitals Group  ?   BP 07/20/21 0855 (!) 160/92  ?   Pulse Rate 07/20/21 0855 78  ?   Resp 07/20/21 0855 (!) 28  ?   Temp 07/20/21 0855 98 ?F (36.7 ?C)  ?   Temp Source 07/20/21 0855 Oral  ?   SpO2 07/20/21 0855 94 %  ?   Weight 07/20/21 0850 111 lb 3.2 oz (50.4 kg)  ?   Height 07/20/21 0850 '5\' 2"'$  (1.575 m)  ?   Head Circumference --   ?   Peak Flow --   ?   Pain Score 07/20/21 0850 10  ?   Pain Loc --   ?   Pain Edu? --   ?   Excl. in [place]? --   ? ? ?Most recent vital signs: ?Vitals:  ? 07/20/21 1045 07/20/21 1100  ?BP:  122/85  ?Pulse: 80 97  ?Resp: (!) 22 (!) 26  ?Temp:    ?SpO2: 90% 90%  ? ? ? ?General: Awake, no distress.  ?CV:  Good peripheral perfusion.  Regular rate and rhythm.  Symmetric distal pulses ?Resp:  Normal effort.  Clear to auscultation bilaterally ?Abd:  No distention.  Soft with mild epigastric tenderness ?Other:  Dry mucous membranes ? ? ?ED Results / Procedures / Treatments  ? ?Labs ?(all labs ordered are listed, but only abnormal results are displayed) ?Labs Reviewed  ?CBC - Abnormal; Notable for the following components:  ?    Result Value  ? WBC 3.7 (*)   ? Hemoglobin 15.4 (*)   ? HCT 48.3 (*)   ? All other components within normal limits  ?HEPATIC FUNCTION PANEL - Abnormal; Notable for the following components:  ? AST 136 (*)   ? ALT 59 (*)   ? Bilirubin, Direct 0.4 (*)   ? All  other components within normal limits  ?BASIC METABOLIC PANEL  ?LIPASE, BLOOD  ?TROPONIN I (HIGH SENSITIVITY)  ?TROPONIN I (HIGH SENSITIVITY)  ? ? ? ?EKG ? ?Interpreted by me ?Normal sinus rhythm rate of 81.  Normal axis and intervals.  Poor R wave progression.  Normal ST segments and T waves. ? ? ?RADIOLOGY ?Chest x-ray viewed and interpreted by me, no pneumothorax or obvious pneumonia pleural effusion or edema.  Radiology report suggest a left upper lobe infiltrate. ? ? ? ?PROCEDURES: ? ?Critical Care performed: No ? ?Procedures ? ? ?MEDICATIONS ORDERED IN ED: ?Medications  ?cefTRIAXone (ROCEPHIN) 2 g in sodium chloride 0.9 % 100 mL IVPB (has no administration in time range)  ?azithromycin (ZITHROMAX) 500 mg in sodium chloride 0.9 % 250 mL IVPB (has no administration in time range)  ?lactated ringers bolus 1,000 mL (1,000 mLs Intravenous New Bag/Given 07/20/21 1030)  ?ketorolac (TORADOL) 15 MG/ML injection 15 mg (15 mg Intravenous Given 07/20/21 1028)  ?ondansetron (ZOFRAN) injection 4 mg (4 mg  Intravenous Given 07/20/21 1029)  ?pantoprazole (PROTONIX) injection 40 mg (40 mg Intravenous Given 07/20/21 1027)  ? ? ? ?IMPRESSION / MDM / ASSESSMENT AND PLAN / ED COURSE  ?I reviewed the triage vital signs and the nursing notes. ?             ?               ? ?Differential diagnosis includes, but is not limited to, pneumonia, pleural effusion, non-STEMI, dehydration, electrolyte abnormality, anemia, pancreatitis, biliary disease, gastritis ? ? ? ?Patient presents with epigastric tenderness and shortness of breath.  Chest x-ray shows a pneumonia, oxygen saturation is 90% on room air at rest.  Other labs are unremarkable except for some mild elevation in transaminases, possibly due to alcoholic gastritis. ? ?I discussed with the patient and her spouse her functional status and home environment.  They agreed that managing her current illness at home will be difficult and think he would be better to proceed with  hospitalization for IV antibiotics until improving.  I have discussed with the hospitalist for further management. ?  ? ? ?FINAL CLINICAL IMPRESSION(S) / ED DIAGNOSES  ? ?Final diagnoses:  ?Community acquired pneumonia of left upper lobe of lung  ? ? ? ?Rx / DC Orders  ? ?ED Discharge Orders   ? ? None  ? ?  ? ? ? ?Note:  This document was prepared using Dragon voice recognition software and may include unintentional dictation errors. ?  ?Carrie Mew, MD ?07/20/21 1211 ? ?

## 2021-07-20 NOTE — ED Triage Notes (Signed)
Pt comes into the ED via POV c/o central chest pain with SHOB and diarrhea.  Pt states the chest pain and SHOB started last night.  Pt does have COPD and has had a bypass completed before as well.  PT denies any history of MI's.  Pt currently has even and unlabored respirations and is in NAD.  ?

## 2021-07-20 NOTE — Assessment & Plan Note (Addendum)
Patient noted to have transaminitis consistent with history of alcohol abuse. ?She has been advised to abstain from further alcohol use  ?

## 2021-07-20 NOTE — Assessment & Plan Note (Signed)
Patient presents for evaluation of chest pain and shortness of breath and is found to have a left upper lobe infiltrate ?She was ambulated in the ER and found to have room air pulse oximetry of 90% ?We will treat patient empirically with Rocephin and Zithromax ?

## 2021-07-20 NOTE — Assessment & Plan Note (Signed)
Patient has been counseled to abstain from further alcohol use ?We will place on CIWA protocol and administer lorazepam for CIWA score of 8 or greater ?

## 2021-07-20 NOTE — H&P (Signed)
?History and Physical  ? ? ?Patient: Marie [name] EHM:[id number] DOB: 11/08/1965 ?DOA: 07/20/2021 ?DOS: the patient was seen and examined on 07/20/2021 ?PCP: Gaynelle Arabian, MD  ?Patient coming from: Home ? ?Chief Complaint:  ?Chief Complaint  ?Patient presents with  ? Chest Pain  ? Shortness of Breath  ? ?HPI: Marie [name] is a 56 y.o. female with medical history significant for chronic diastolic dysfunction CHF, alcohol and nicotine dependence, coronary artery disease, chronic pancreatitis, pulmonary sarcoidosis, diabetes mellitus who presents to the ER for evaluation of chest pain and shortness of breath which she has had for several days.  Patient has a history of pulmonary sarcoidosis as well as COPD and has a baseline dyspnea with exertion.  She notes some midsternal chest discomfort which is worse when she bends down.  She also complains of abdominal pain which is mostly in the periumbilical area with radiation to her back associated with diarrhea.  She rates her pain an 8 x 10 in intensity at its worst. ?She has a cough productive of clear phlegm and this is not unusual.  She denies having any fever, no chills, no nausea, no vomiting, no diaphoresis or palpitations. ?She admits to daily alcohol use and smokes about 1 pack of cigarettes daily. ?She denies having any urinary symptoms, no leg swelling, no headache, no dizziness, no lightheadedness, no focal deficits of blurred vision. ?Review of Systems: As mentioned in the history of present illness. All other systems reviewed and are negative. ?Past Medical History:  ?Diagnosis Date  ? (HFpEF) heart failure with preserved ejection fraction ([place])   ? a. 10/2018 EF >65% by SPECT.  ? Chronic pancreatitis ([place])   ? Coronary atherosclerosis of unspecified type of vessel, native or graft   ? a. 09/2018 s/p CABG x 3; b. 07/2009 s/p PCI-->LCX; 10/2018 MV: Low risk w/ small/mild apical inf/lateral reversible defect - likley artifact. EF >65%.  ? Headache(784.0)   ?  "weekly" (07/29/2013)  ? Hyperlipidemia LDL goal <70   ? Hypertension   ? Leiomyoma of uterus, unspecified   ? Loss of weight   ? Obesity, unspecified   ? OSA (obstructive sleep apnea)   ? no CPAP  ? Pulmonary sarcoidosis ([place])   ? Pure hypercholesterolemia   ? Shortness of breath   ? none since stent 2 yrs ago  ? Type II diabetes mellitus ([place])   ? ?Past Surgical History:  ?Procedure Laterality Date  ? BREAST CYST ASPIRATION    ? does not remember which breast  ? BREAST EXCISIONAL BIOPSY    ? does not remember which breast  ? CORONARY ANGIOPLASTY WITH STENT PLACEMENT  09/2009  ? "1"  ? CORONARY ARTERY BYPASS GRAFT  ~ 2000  ? CABG X3  ? HYSTEROSCOPY WITH NOVASURE N/A 09/04/2012  ? Procedure: HYSTEROSCOPY WITH NOVASURE;  Surgeon: Mora Bellman, MD;  Location: [facility];  Service: Gynecology;  Laterality: N/A;  with removal intrauterine device  ? LEEP N/A 12/25/2018  ? Procedure: LOOP ELECTROSURGICAL EXCISION PROCEDURE (LEEP);  Surgeon: Gae Dry, MD;  Location: [facility];  Service: Gynecology;  Laterality: N/A;  ? ROBOTIC ASSISTED LAPAROSCOPIC CHOLECYSTECTOMY-MULTI SITE N/A 01/14/2018  ? Procedure: ROBOTIC ASSISTED LAPAROSCOPIC CHOLECYSTECTOMY-MULTI SITE;  Surgeon: Jules Husbands, MD;  Location: [facility];  Service: General;  Laterality: N/A;  ? [place]  ? ?Social History:  reports that she has been smoking cigarettes. She has a 16.00 pack-year smoking history. She has never used smokeless tobacco. She reports  current alcohol use of about 24.0 standard drinks per week. She reports that she does not use drugs. ? ?Allergies  ?Allergen Reactions  ? Penicillins Other (See Comments)  ?  Unknown   ? Lisinopril Hives  ? ? ?Family History  ?Problem Relation Age of Onset  ? Diabetes Mother   ? Coronary artery disease Mother   ? Heart disease Mother   ? Hyperlipidemia Mother   ? Sudden death Mother   ? Colon cancer Maternal Grandfather   ? Liver disease Maternal Uncle   ? Heart disease Maternal Uncle   ? Heart  disease Paternal Aunt   ? Stroke Maternal Grandmother   ? Breast cancer Neg Hx   ? ? ?Prior to Admission medications   ?Medication Sig Start Date End Date Taking? Authorizing Provider  ?clopidogrel (PLAVIX) 75 MG tablet 1 tablet Orally Once a day to thin blood 11/30/20     ?hydrochlorothiazide (HYDRODIURIL) 25 MG tablet TAKE 1 TABLET BY MOUTH ONCE A DAY FOR BLOOD PRESSURE 03/07/20 03/07/21  Gaynelle Arabian, MD  ?hydrochlorothiazide (HYDRODIURIL) 25 MG tablet 1 tablet Orally once a day for blood pressure 11/30/20     ?HYDROcodone-acetaminophen (NORCO/VICODIN) 5-325 MG tablet Take 1 tablet by mouth every 6 (six) hours as needed. 06/28/21     ?ibuprofen (ADVIL) 200 MG tablet Take 400 mg by mouth every 6 (six) hours as needed for headache or moderate pain.    [provider]  ?losartan (COZAAR) 100 MG tablet 1 tablet Orally Once a day for BP and diabetic kidney protection 11/30/20     ?metFORMIN (GLUCOPHAGE) 500 MG tablet 1 tablet with breakfast Orally Once a day for diabetes 06/21/21     ?metoprolol tartrate (LOPRESSOR) 100 MG tablet TAKE 1 TABLET BY MOUTH ONCE DAILY FOR BLOOD PRESSURE AND HEART ?Patient not taking: Reported on 11/11/2020 06/10/20 06/10/21  Gaynelle Arabian, MD  ?metoprolol tartrate (LOPRESSOR) 100 MG tablet 1 tablet Orally Once a day for BP and heart 11/30/20     ?nitroGLYCERIN (NITROSTAT) 0.4 MG SL tablet Place 1 tablet (0.4 mg total) under the tongue every 5 (five) minutes as needed for chest pain. Maximum of 3 doses. 09/10/18   End, Harrell Gave, MD  ?rosuvastatin (CRESTOR) 10 MG tablet 1 tablet Orally Once a day for cholesterol 06/21/21     ?umeclidinium-vilanterol (ANORO ELLIPTA) 62.5-25 MCG/ACT AEPB Inhale 1 puff into the lungs daily. 07/12/21   Freddi Starr, MD  ?umeclidinium-vilanterol (ANORO ELLIPTA) 62.5-25 MCG/ACT AEPB Inhale 1 puff into the lungs daily. 07/12/21   Freddi Starr, MD  ? ? ?Physical Exam: ?Vitals:  ? 07/20/21 1230 07/20/21 1245 07/20/21 1300 07/20/21 1318  ?BP:      ?Pulse:  89 (!) 103 (!) 102 99  ?Resp: (!) '24 20 17 '$ (!) 22  ?Temp:      ?TempSrc:      ?SpO2: 92% 91% 91% 93%  ?Weight:      ?Height:      ? ?Physical Exam ?Vitals and nursing note reviewed.  ?Constitutional:   ?   Comments: Chronically ill-appearing.  Thin  ?HENT:  ?   Head: Normocephalic and atraumatic.  ?Eyes:  ?   Pupils: Pupils are equal, round, and reactive to light.  ?Cardiovascular:  ?   Rate and Rhythm: Normal rate and regular rhythm.  ?   Heart sounds: Normal heart sounds.  ?Pulmonary:  ?   Effort: Pulmonary effort is normal.  ?Abdominal:  ?   General: Bowel sounds are normal.  ?  Palpations: Abdomen is soft.  ?Musculoskeletal:     ?   General: Normal range of motion.  ?   Cervical back: Normal range of motion and neck supple.  ?Skin: ?   General: Skin is warm and dry.  ?Neurological:  ?   General: No focal deficit present.  ?   Mental Status: She is alert.  ?Psychiatric:     ?   Mood and Affect: Mood normal.     ?   Behavior: Behavior normal.  ? ? ?Data Reviewed: ?Relevant notes from primary care and specialist visits, past discharge summaries as available in EHR, including Care Everywhere. ?Prior diagnostic testing as pertinent to current admission diagnoses ?Updated medications and problem lists for reconciliation ?ED course, including vitals, labs, imaging, treatment and response to treatment ?Triage notes, nursing and pharmacy notes and ED provider's notes ?Notable results as noted in HPI ?Labs reviewed.  AST 136, ALT 59, white count 3.7, hemoglobin 15.4, hematocrit 48.3, MCV 97.8, platelet count 192, lipase 21, troponin 4 ?Chest x-ray reviewed by me shows a new small patchy infiltrate in the lateral aspect of left ?upper lung fields suggesting pneumonia or pleural thickening. Increased interstitial markings in both lungs, especially in the lower lung fields may suggest interstitial lung disease. ?Twelve-lead EKG reviewed by me shows normal sinus rhythm. ? ?There are no new results to review at this  time. ? ?Assessment and Plan: ?* Pneumonia ?Patient presents for evaluation of chest pain and shortness of breath and is found to have a left upper lobe infiltrate ?She was ambulated in the ER and found to have room

## 2021-07-20 NOTE — ED Notes (Signed)
LR fluids not running when this RN received pt due to previous antibiotic running. This RN will restart LR once current antibiotic completed. ?

## 2021-07-20 NOTE — Assessment & Plan Note (Signed)
Smoking cessation discussed with patient in detail ?She smokes about 1 pack of cigarettes daily ?She declines nicotine transdermal patch at this time ?

## 2021-07-20 NOTE — Assessment & Plan Note (Signed)
Patient has a known history of pulmonary sarcoidosis ?Currently not acutely exacerbated ?Continue as needed bronchodilator therapy as well as inhaled steroids ?

## 2021-07-21 ENCOUNTER — Other Ambulatory Visit: Payer: Self-pay

## 2021-07-21 DIAGNOSIS — J189 Pneumonia, unspecified organism: Secondary | ICD-10-CM | POA: Diagnosis not present

## 2021-07-21 DIAGNOSIS — F1010 Alcohol abuse, uncomplicated: Secondary | ICD-10-CM

## 2021-07-21 DIAGNOSIS — I5033 Acute on chronic diastolic (congestive) heart failure: Secondary | ICD-10-CM

## 2021-07-21 LAB — BASIC METABOLIC PANEL
Anion gap: 5 (ref 5–15)
BUN: 11 mg/dL (ref 6–20)
CO2: 26 mmol/L (ref 22–32)
Calcium: 8.5 mg/dL — ABNORMAL LOW (ref 8.9–10.3)
Chloride: 107 mmol/L (ref 98–111)
Creatinine, Ser: 0.9 mg/dL (ref 0.44–1.00)
GFR, Estimated: >60 mL/min (ref >60)
Glucose, Bld: 88 mg/dL (ref 70–99)
Potassium: 3.8 mmol/L (ref 3.5–5.1)
Sodium: 138 mmol/L (ref 135–145)

## 2021-07-21 LAB — CBC
HCT: 41.7 % (ref 36.0–46.0)
Hemoglobin: 13.2 g/dL (ref 12.0–15.0)
MCH: 31.3 pg (ref 26.0–34.0)
MCHC: 31.7 g/dL (ref 30.0–36.0)
MCV: 98.8 fL (ref 80.0–100.0)
Platelets: 153 10*3/uL (ref 150–400)
RBC: 4.22 MIL/uL (ref 3.87–5.11)
RDW: 12.3 % (ref 11.5–15.5)
WBC: 4.2 10*3/uL (ref 4.0–10.5)
nRBC: 0 % (ref 0.0–0.2)

## 2021-07-21 LAB — GLUCOSE, CAPILLARY
Glucose-Capillary: 85 mg/dL (ref 70–99)
Glucose-Capillary: 92 mg/dL (ref 70–99)
Glucose-Capillary: 98 mg/dL (ref 70–99)

## 2021-07-21 LAB — BRAIN NATRIURETIC PEPTIDE: B Natriuretic Peptide: 426.4 pg/mL — ABNORMAL HIGH (ref 0.0–100.0)

## 2021-07-21 LAB — PROCALCITONIN: Procalcitonin: <0.1 ng/mL

## 2021-07-21 MED ORDER — FUROSEMIDE 40 MG PO TABS
40.0000 mg | ORAL_TABLET | Freq: Every day | ORAL | 0 refills | Status: DC
  Filled 2021-07-21: qty 30, 30d supply, fill #0

## 2021-07-21 MED ORDER — ONDANSETRON HCL 4 MG PO TABS: 4.0000 mg | ORAL_TABLET | Freq: Four times a day (QID) | ORAL | Status: DC | PRN

## 2021-07-21 MED ORDER — MAGNESIUM SULFATE 2 GM/50ML IV SOLN
2.0000 g | Freq: Once | INTRAVENOUS | Status: AC
  Administered 2021-07-21: 2 g via INTRAVENOUS
  Filled 2021-07-21: qty 50

## 2021-07-21 MED ORDER — ONDANSETRON HCL 4 MG/2ML IJ SOLN
4.0000 mg | INTRAMUSCULAR | Status: DC | PRN
  Administered 2021-07-21: 4 mg via INTRAVENOUS

## 2021-07-21 MED ORDER — FUROSEMIDE 40 MG PO TABS
40.0000 mg | ORAL_TABLET | Freq: Every day | ORAL | Status: DC
  Administered 2021-07-21: 40 mg via ORAL
  Filled 2021-07-21: qty 1

## 2021-07-21 NOTE — Progress Notes (Signed)
Pt being discharged home, discharge instruction reviewed with pt, states understanding, pt with no complaints, awaiting husband for transport  ?

## 2021-07-21 NOTE — Discharge Summary (Signed)
?Physician Discharge Summary ?  ?Patient: Marie [name] MRN: [id number] DOB: 1966/03/14  ?Admit date:     07/20/2021  ?Discharge date: 07/21/21  ?Discharge Physician: Sharen Hones  ? ?PCP: Gaynelle Arabian, MD  ? ?Recommendations at discharge:  ? ?Follow-up with PCP in 1 week. ?Follow-up with Dr. Rockey Situ in cardiology in 2 weeks. ? ?Discharge Diagnoses: ?Principal Problem: ?  Pneumonia ?Active Problems: ?  Tobacco use ?  Pulmonary sarcoidosis ([place]) ?  ETOH abuse ?  Diabetes mellitus with complication ([place]) ?  Chronic pancreatitis ([place]) ?  Transaminitis ?  Acute on chronic diastolic CHF (congestive heart failure) ([place]) ?  Hypomagnesemia ? ?Resolved Problems: ?  * No resolved hospital problems. * ? ?Hospital Course: ?Marie [name] is a 56 y.o. female with medical history significant for chronic diastolic dysfunction CHF, alcohol and nicotine dependence, coronary artery disease, chronic pancreatitis, pulmonary sarcoidosis, diabetes mellitus who presents to the ER for evaluation of chest pain and shortness of breath which she has had for several days.  ?Patient did not have significant hypoxemia, chest x-ray showed left upper lobe infiltrates.  She was placed on antibiotics with Rocephin and Zithromax.  But procalcitonin level less than 0.1, not bacterial pneumonia. ?She was found to have significant elevation BNP at 426, she is given oral Lasix. ? ? ? ?Assessment and Plan: ?Acute on chronic diastolic congestive heart failure. POA ?Reviewed prior stress test performed in 2020, ejection fraction was more than 65%.  I ordered another echocardiogram, apparently could not perform due to schedule issue.  Patient does not want to wait for it. ?Patient has evidence of volume overload which is pretty mild.  She will be treated with oral Lasix. ?Echocardiogram can be performed in Dr. Donivan Scull office at the next visit. ?I will continue oral Lasix 40 mg daily. ? ?Pneumonia ?Most likely viral, procalcitonin level less than 0.1,  discontinue antibiotics started in the emergency room. ? ?Pulmonary sarcoidosis ?Patient does not have exacerbation.  Follow-up with her on pulmonology as outpatient. ? ?Alcohol abuse. ?Alcoholic hepatitis. 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 ? ?Type 2 diabetes ?Continue metformin. ? ?  ? ? ?Consultants: None ?Procedures performed: None  ?Disposition: Home ?Diet recommendation:  ?Discharge Diet Orders (From admission, onward)  ? ?  Start     Ordered  ? 07/21/21 0000  Diet - low sodium heart healthy       ? 07/21/21 1215  ? ?  ?  ? ?  ? ?Cardiac diet ?DISCHARGE MEDICATION: ?Allergies as of 07/21/2021   ? ?   Reactions  ? Penicillins Other (See Comments)  ? Unknown   ? Lisinopril Hives  ? Other reaction(s): hives/angiod  ? ?  ? ?  ?Medication List  ?  ? ?STOP taking these medications   ? ?hydrochlorothiazide 25 MG tablet ?Commonly known as: HYDRODIURIL ?  ?ibuprofen 200 MG tablet ?Commonly known as: ADVIL ?  ?losartan 100 MG tablet ?Commonly known as: COZAAR ?  ? ?  ? ?TAKE these medications   ? ?Anoro Ellipta 62.5-25 MCG/ACT Aepb ?Generic drug: umeclidinium-vilanterol ?Inhale 1 puff into the lungs daily. ?What changed: Another medication with the same name was removed. Continue taking this medication, and follow the directions you see here. ?  ?clopidogrel 75 MG tablet ?Commonly known as: PLAVIX ?Take 1 tablet by mouth once daily to thin blood ?(1 tablet Orally Once a day to thin blood) ?  ?furosemide 40 MG tablet ?Commonly known as: LASIX ?Take 1  tablet (40 mg total) by mouth daily. ?Start taking on: July 22, 2021 ?  ?HYDROcodone-acetaminophen 5-325 MG tablet ?Commonly known as: NORCO/VICODIN ?Take 1 tablet by mouth every 6 (six) hours as needed. ?  ?metFORMIN 500 MG tablet ?Commonly known as: GLUCOPHAGE ?Take 1 tablet by mouth once daily with breakfast for diabetes ?(1 tablet with breakfast Orally Once a day for diabetes) ?  ?metoprolol tartrate 100 MG  tablet ?Commonly known as: LOPRESSOR ?TAKE 1 TABLET BY MOUTH ONCE DAILY FOR BLOOD PRESSURE AND HEART ?What changed: Another medication with the same name was removed. Continue taking this medication, and follow the directions you see here. ?  ?nitroGLYCERIN 0.4 MG SL tablet ?Commonly known as: Nitrostat ?Place 1 tablet (0.4 mg total) under the tongue every 5 (five) minutes as needed for chest pain. Maximum of 3 doses. ?  ?rosuvastatin 10 MG tablet ?Commonly known as: CRESTOR ?Take 1 tablet by mouth once a day for cholesterol ?(1 tablet Orally Once a day for cholesterol) ?  ? ?  ? ? Follow-up Information   ? ? Gaynelle Arabian, MD Follow up in 1 day(s).   ?Specialty: Family Medicine ?Contact information: ?[address], [address] ?[place] [address] ?[phone] ? ? ?  ?  ? ? Minna Merritts, MD Follow up in 2 week(s).   ?Specialty: Cardiology ?Contact information: ?[address] ?[place] [address] ?[phone] ? ? ?  ?  ? ?  ?  ? ?  ? ?Discharge Exam: ?Filed Weights  ? 07/20/21 0850  ?Weight: 50.4 kg  ? ?General exam: Appears calm and comfortable  ?Respiratory system: Crackles in the bases more on the left side. Respiratory effort normal. ?Cardiovascular system: S1 & S2 heard, RRR. No JVD, murmurs, rubs, gallops or clicks. No pedal edema. ?Gastrointestinal system: Abdomen is nondistended, soft and nontender. No organomegaly or masses felt. Normal bowel sounds heard. ?Central nervous system: Alert and oriented. No focal neurological deficits. ?Extremities: Symmetric 5 x 5 power. ?Skin: No rashes, lesions or ulcers ?Psychiatry: Judgement and insight appear normal. Mood & affect appropriate.  ? ? ?Condition at discharge: good ? ?The results of significant diagnostics from this hospitalization (including imaging, microbiology, ancillary and laboratory) are listed below for reference.  ? ?Imaging Studies: ?DG Chest 2 View ? ?Result Date: 07/20/2021 ?CLINICAL DATA:  Chest pain, shortness of  breath EXAM: CHEST - 2 VIEW COMPARISON:  11/10/2020 FINDINGS: Increased interstitial markings are seen in both lungs, more so in the lower lung fields with no significant interval change. Findings suggest interstitial lung disease and possible pulmonary fibrosis. There is small new patchy infiltrate in the lateral aspect of left upper lung fields. There is no pleural effusion or pneumothorax. IMPRESSION: There is new small patchy infiltrate in the lateral aspect of left upper lung fields suggesting pneumonia or pleural thickening. Increased interstitial markings in both lungs, especially in the lower lung fields may suggest interstitial lung disease. Electronically Signed   By: Elmer Picker M.D.   On: 07/20/2021 10:11   ? ?Microbiology: ?Results for orders placed or performed during the hospital encounter of 04/03/21  ?Resp Panel by RT-PCR (Flu A&B, Covid) Nasopharyngeal Swab     Status: None  ? Collection Time: 04/03/21 11:13 PM  ? Specimen: Nasopharyngeal Swab; Nasopharyngeal(NP) swabs in vial transport medium  ?Result Value Ref Range Status  ? SARS Coronavirus 2 by RT PCR NEGATIVE NEGATIVE Final  ?  Comment: (NOTE) ?SARS-CoV-2 target nucleic acids are NOT DETECTED. ? ?The SARS-CoV-2  RNA is generally detectable in upper respiratory ?specimens during the acute phase of infection. The lowest ?concentration of SARS-CoV-2 viral copies this assay can detect is ?138 copies/mL. A negative result does not preclude SARS-Cov-2 ?infection and should not be used as the sole basis for treatment or ?other patient management decisions. A negative result may occur with  ?improper specimen collection/handling, submission of specimen other ?than nasopharyngeal swab, presence of viral mutation(s) within the ?areas targeted by this assay, and inadequate number of viral ?copies(<138 copies/mL). A negative result must be combined with ?clinical observations, patient history, and epidemiological ?information. The expected result  is Negative. ? ?Fact Sheet for Patients:  ?EntrepreneurPulse.com.au ? ?Fact Sheet for Healthcare Providers:  ?IncredibleEmployment.be ? ?This test is no t yet approved or cleared by the

## 2021-07-21 NOTE — TOC CM/SW Note (Signed)
TOC consulted for SA resources. ?SA resource list printed to be added to DC packet. ? Oleh Genin, LCSW ?[phone] ? ?

## 2021-07-24 ENCOUNTER — Encounter: Payer: Self-pay | Admitting: Pulmonary Disease

## 2021-07-26 ENCOUNTER — Other Ambulatory Visit: Payer: Self-pay

## 2021-07-27 ENCOUNTER — Other Ambulatory Visit: Payer: Self-pay

## 2021-07-27 MED ORDER — HYDROCODONE-ACETAMINOPHEN 5-325 MG PO TABS
ORAL_TABLET | ORAL | 0 refills | Status: DC
  Filled 2021-07-27: qty 24, 6d supply, fill #0

## 2021-07-31 ENCOUNTER — Telehealth: Payer: Self-pay | Admitting: Pulmonary Disease

## 2021-07-31 NOTE — Telephone Encounter (Signed)
Patient states the copay for Anoro inhaler is $90 and she needs an alternative, cannot affrod that. ? ?Please advise. ?

## 2021-07-31 NOTE — Telephone Encounter (Signed)
Routing this to prior auth team as well as Dr. Erin Fulling to see if there would be a more affordable inhaler for pt. ?

## 2021-08-01 ENCOUNTER — Other Ambulatory Visit (HOSPITAL_COMMUNITY): Payer: Self-pay

## 2021-08-01 NOTE — Telephone Encounter (Signed)
Called patient but she was having phone issues. Will call back tomorrow.  ?

## 2021-08-01 NOTE — Telephone Encounter (Signed)
Dr. Erin Fulling, please advise. ?

## 2021-08-01 NOTE — Telephone Encounter (Signed)
Test billing results for L[facility]/L[facility] are as follows: ?Anoro: $90.55 ?Stiolto: $9.99 with an eVoucher applied of $82.20 ?Bevespi: Non-formulary ? ? ?

## 2021-08-02 ENCOUNTER — Other Ambulatory Visit: Payer: Self-pay

## 2021-08-02 MED ORDER — STIOLTO RESPIMAT 2.5-2.5 MCG/ACT IN AERS
2.0000 | INHALATION_SPRAY | Freq: Every day | RESPIRATORY_TRACT | 5 refills | Status: DC
  Filled 2021-08-02: qty 4, 30d supply, fill #0

## 2021-08-02 NOTE — Telephone Encounter (Signed)
Called and spoke with patient. She is aware of the Stiolto and the instructions. She verbalized understanding. RX has been sent to [facility].  ? ?Nothing further needed at time of call.  ?

## 2021-08-02 NOTE — Telephone Encounter (Signed)
Patient is returning phone call. Patient phone number is [phone]. ?

## 2021-08-03 ENCOUNTER — Other Ambulatory Visit: Payer: Self-pay

## 2021-08-23 ENCOUNTER — Other Ambulatory Visit: Payer: Self-pay | Admitting: Family Medicine

## 2021-08-23 ENCOUNTER — Other Ambulatory Visit: Payer: Self-pay

## 2021-08-23 DIAGNOSIS — Z1231 Encounter for screening mammogram for malignant neoplasm of breast: Secondary | ICD-10-CM

## 2021-08-23 MED ORDER — HYDROCODONE-ACETAMINOPHEN 5-325 MG PO TABS
ORAL_TABLET | ORAL | 0 refills | Status: DC
  Filled 2021-08-23: qty 24, 6d supply, fill #0

## 2021-09-15 ENCOUNTER — Ambulatory Visit
Admission: RE | Admit: 2021-09-15 | Discharge: 2021-09-15 | Disposition: A | Discharge status: Home / Self Care | Payer: 59 | Source: Ambulatory Visit | Attending: Family Medicine | Admitting: Family Medicine

## 2021-09-15 DIAGNOSIS — Z1231 Encounter for screening mammogram for malignant neoplasm of breast: Secondary | ICD-10-CM

## 2021-09-19 ENCOUNTER — Other Ambulatory Visit: Payer: Self-pay

## 2021-09-19 MED ORDER — GLUCOSE BLOOD VI STRP
ORAL_STRIP | 0 refills | Status: DC
  Filled 2021-09-19: qty 100, 25d supply, fill #0

## 2021-09-19 MED ORDER — HYDROCODONE-ACETAMINOPHEN 5-325 MG PO TABS
ORAL_TABLET | ORAL | 0 refills | Status: DC
  Filled 2021-09-19: qty 24, 6d supply, fill #0

## 2021-09-21 ENCOUNTER — Encounter: Payer: Self-pay | Admitting: *Deleted

## 2021-09-28 NOTE — Progress Notes (Deleted)
My card  New Outpatient Visit Date: 09/29/2021  Referring Provider: Gaynelle Arabian, MD [address] [place],  [place] [address]  Chief Complaint: ***  HPI:  Marie [name] is a 56 y.o. female who is being seen today for the evaluation of coronary artery disease at the request of Gaynelle Arabian, MD. She has a history of coronary artery disease with three-vessel CABG in 09/1998 and subsequent PCI to the LCx in 07/2009, hypertension, type 2 diabetes mellitus, and chronic pancreatitis.  I saw her once for preoperative evaluation in 08/2018.  She was feeling fairly well at that time, though she reported some atypical chest pain and exertional dyspnea.  Pharmacologic perfusion stress test was low risk.  She was lost to follow-up thereafter.  --------------------------------------------------------------------------------------------------  Cardiovascular History & Procedures: Cardiovascular Problems: Coronary artery disease   Risk Factors: Known coronary artery disease, hypertension, and diabetes mellitus.   Cath/PCI: PCI to LCx (07/2009)   CV Surgery: Three-vessel CABG (09/1998)   EP Procedures and Devices: None.   Non-Invasive Evaluation(s): Pharmacologic MPI (11/18/2018): Low risk, probably normal pharmacologic MPI with small in size, mild in severity, reversible defect involving the apical inferior and lateral segments most likely representing artifact but cannot rule out subtle ischemia.  LVEF greater than 65% with post CABG findings, aortic atherosclerosis, and pancreatic calcification noted on the attenuation correction CT.  Recent CV Pertinent Labs: Lab Results  Component Value Date   CHOL 210 (H) 03/22/2011   HDL 52 03/22/2011   LDLCALC 135 (H) 03/22/2011   TRIG 99 08/13/2012   CHOLHDL 4.0 03/22/2011   INR 1.07 03/22/2011   BNP 426.4 (H) 07/21/2021   K 3.8 07/21/2021   MG 1.6 (L) 07/20/2021   BUN 11 07/21/2021   CREATININE 0.90 07/21/2021     --------------------------------------------------------------------------------------------------  Past Medical History:  Diagnosis Date   (HFpEF) heart failure with preserved ejection fraction ([place])    a. 10/2018 EF >65% by SPECT.   Chronic pancreatitis ([facility])    Coronary atherosclerosis of unspecified type of vessel, native or graft    a. 09/2018 s/p CABG x 3; b. 07/2009 s/p PCI-->LCX; 10/2018 MV: Low risk w/ small/mild apical inf/lateral reversible defect - likley artifact. EF >65%.   ZHGDJMEQ(683.4)    "weekly" (07/29/2013)   Hyperlipidemia LDL goal <70    Hypertension    Leiomyoma of uterus, unspecified    Loss of weight    Obesity, unspecified    OSA (obstructive sleep apnea)    no CPAP   Pulmonary sarcoidosis ([facility])    Pure hypercholesterolemia    Shortness of breath    none since stent 2 yrs ago   Type II diabetes mellitus ([place])     Past Surgical History:  Procedure Laterality Date   BREAST CYST ASPIRATION     does not remember which breast   BREAST EXCISIONAL BIOPSY     does not remember which breast   CORONARY ANGIOPLASTY WITH STENT PLACEMENT  09/2009   "1"   CORONARY ARTERY BYPASS GRAFT  ~ 2000   CABG X3   HYSTEROSCOPY WITH NOVASURE N/A 09/04/2012   Procedure: HYSTEROSCOPY WITH NOVASURE;  Surgeon: Mora Bellman, MD;  Location: [facility];  Service: Gynecology;  Laterality: N/A;  with removal intrauterine device   LEEP N/A 12/25/2018   Procedure: LOOP ELECTROSURGICAL EXCISION PROCEDURE (LEEP);  Surgeon: Gae Dry, MD;  Location: [facility];  Service: Gynecology;  Laterality: N/A;   ROBOTIC ASSISTED LAPAROSCOPIC CHOLECYSTECTOMY-MULTI SITE N/A 01/14/2018   Procedure: ROBOTIC  ASSISTED LAPAROSCOPIC CHOLECYSTECTOMY-MULTI SITE;  Surgeon: Jules Husbands, MD;  Location: [facility];  Service: General;  Laterality: N/A;   TUBAL LIGATION  1997    No outpatient medications have been marked as taking for the 09/29/21 encounter (Appointment) with Amenah Tucci, Marie Gave, MD.     Allergies: Penicillins and Lisinopril  Social History   Tobacco Use   Smoking status: Every Day    Packs/day: 0.50    Years: 32.00    Total pack years: 16.00    Types: Cigarettes   Smokeless tobacco: Never  Vaping Use   Vaping Use: Never used  Substance Use Topics   Alcohol use: Yes    Alcohol/week: 24.0 standard drinks of alcohol    Types: 24 Glasses of wine per week    Comment: 08/28/2016 "3-4 glasses of wine qd; last glass was last night",   Drug use: No    Family History  Problem Relation Age of Onset   Diabetes Mother    Coronary artery disease Mother    Heart disease Mother    Hyperlipidemia Mother    Sudden death Mother    Colon cancer Maternal Grandfather    Liver disease Maternal Uncle    Heart disease Maternal Uncle    Heart disease Paternal Aunt    Stroke Maternal Grandmother    Breast cancer Neg Hx     Review of Systems: A 12-system review of systems was performed and was negative except as noted in the HPI.  --------------------------------------------------------------------------------------------------  Physical Exam: There were no vitals taken for this visit.  General:  *** HEENT: No conjunctival pallor or scleral icterus. Facemask in place. Neck: Supple without lymphadenopathy, thyromegaly, JVD, or HJR. No carotid bruit. Lungs: Normal work of breathing. Clear to auscultation bilaterally without wheezes or crackles. Heart: Regular rate and rhythm without murmurs, rubs, or gallops. Non-displaced PMI. Abd: Bowel sounds present. Soft, NT/ND without hepatosplenomegaly Ext: No lower extremity edema. Radial, PT, and DP pulses are 2+ bilaterally Skin: Warm and dry without rash. Neuro: CNIII-XII intact. Strength and fine-touch sensation intact in upper and lower extremities bilaterally. Psych: Normal mood and affect.  EKG:  ***  Lab Results  Component Value Date   WBC 4.2 07/21/2021   HGB 13.2 07/21/2021   HCT 41.7 07/21/2021   MCV 98.8  07/21/2021   PLT 153 07/21/2021    Lab Results  Component Value Date   NA 138 07/21/2021   K 3.8 07/21/2021   CL 107 07/21/2021   CO2 26 07/21/2021   BUN 11 07/21/2021   CREATININE 0.90 07/21/2021   GLUCOSE 88 07/21/2021   ALT 59 (H) 07/20/2021    Lab Results  Component Value Date   CHOL 210 (H) 03/22/2011   HDL 52 03/22/2011   LDLCALC 135 (H) 03/22/2011   TRIG 99 08/13/2012   CHOLHDL 4.0 03/22/2011     --------------------------------------------------------------------------------------------------  ASSESSMENT AND PLAN: Marie Gave Mehki Klumpp, MD 09/28/2021 8:27 AM

## 2021-09-29 ENCOUNTER — Ambulatory Visit: Payer: 59 | Admitting: Internal Medicine

## 2021-10-10 ENCOUNTER — Ambulatory Visit (INDEPENDENT_AMBULATORY_CARE_PROVIDER_SITE_OTHER): Payer: 59 | Admitting: Obstetrics

## 2021-10-10 ENCOUNTER — Encounter: Payer: Self-pay | Admitting: Obstetrics

## 2021-10-10 ENCOUNTER — Other Ambulatory Visit (HOSPITAL_COMMUNITY)
Admission: RE | Admit: 2021-10-10 | Discharge: 2021-10-10 | Disposition: A | Discharge status: Home / Self Care | Payer: 59 | Source: Ambulatory Visit | Attending: Obstetrics | Admitting: Obstetrics

## 2021-10-10 VITALS — BP 118/74 | Ht 62.0 in | Wt 110.0 lb

## 2021-10-10 DIAGNOSIS — N393 Stress incontinence (female) (male): Secondary | ICD-10-CM | POA: Diagnosis not present

## 2021-10-10 DIAGNOSIS — Z124 Encounter for screening for malignant neoplasm of cervix: Secondary | ICD-10-CM | POA: Diagnosis present

## 2021-10-10 DIAGNOSIS — Z01411 Encounter for gynecological examination (general) (routine) with abnormal findings: Secondary | ICD-10-CM

## 2021-10-10 DIAGNOSIS — Z01419 Encounter for gynecological examination (general) (routine) without abnormal findings: Secondary | ICD-10-CM

## 2021-10-10 NOTE — Progress Notes (Addendum)
Gynecology Annual Marie [name]  PCP: Gaynelle Arabian, MD  Chief Complaint:  Chief Complaint  Patient presents with   Annual Marie [name]    Annual and pt wants to see one of the MD's for another bladder tact surgery    History of Present Illness:Patient is a 56 y.o. G3P3003 presents for annual Marie [name]. The patient has no complaints today.   LMP: No LMP recorded. Patient has had an ablation. She is married and not sexually active (" I have no desire".  Had a LEEP  three years ago for abnormal pap, and has not been back for  annual physical.  She continues to smoke, and has COPD. Her PCP is D. Ingle.  The patient is not sexually active. She denies dyspareunia.  The patient does not perform self breast exams.  There is no notable family history of breast or ovarian cancer in her family.  The patient wears seatbelts: yes.   The patient has regular exercise: yes.    The patient denies current symptoms of depression.     Review of Systems: Review of Systems  Constitutional: Negative.   HENT: Negative.    Eyes: Negative.   Respiratory:  Positive for wheezing.        COPD,   Cardiovascular:        Chronic HTN  Gastrointestinal: Negative.   Genitourinary:  Positive for frequency and urgency.       Hx of bladder "tack". Having stress and urge incontinence  Musculoskeletal: Negative.   Skin: Negative.   Neurological: Negative.   Endo/Heme/Allergies: Negative.   Psychiatric/Behavioral:         Reports some depressive sxs    Past Medical History:  Patient Active Problem List   Diagnosis Date Noted   Hypomagnesemia 07/21/2021   Pneumonia 07/20/2021   Transaminitis 07/20/2021   Acute on chronic diastolic CHF (congestive heart failure) ([place]) 07/20/2021   Pneumonia due to COVID-19 virus 11/11/2020   Personal history of cervical dysplasia 01/15/2019   CIN III (cervical intraepithelial neoplasia grade III) with severe dysplasia 10/23/2018   Atypical chest pain 09/11/2018   Shortness of breath  09/11/2018   CAD in native artery 09/11/2018   Hyperlipidemia LDL goal <70 09/11/2018   Acute on chronic pancreatitis ([place]) 01/20/2018   Calculus of gallbladder without cholecystitis without obstruction    Malnutrition of moderate degree 08/30/2016   SBO (small bowel obstruction) ([place]) 08/28/2016   History of coronary artery bypass graft 08/28/2016   COPD with chronic bronchitis ([place]) 08/28/2016   Essential hypertension 08/28/2016   Chronic pain 08/28/2016   ETOH abuse 08/28/2016   Abdominal pain    COPD (chronic obstructive pulmonary disease) ([place]) 08/13/2013   Hypoxemia 08/13/2013   Syncope 07/29/2013   Dehydration 07/29/2013   Alcohol abuse, continuous 08/11/2012   Upper abdominal pain 08/11/2012   Abnormal uterine bleeding 08/04/2012   Fibroid uterus 08/04/2012   Hypokalemia 03/24/2011   Acute pancreatitis 03/22/2011   BRONCHITIS, OBSTRUCTIVE CHRONIC 04/14/2010    Qualifier: Diagnosis of  By: Joya Gaskins MD, Burnett Harry     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    Qualifier: Diagnosis of  By: Hassell Done FNP, Tori Milks      WEIGHT LOSS 03/24/2010    Qualifier: Diagnosis of  By: Hassell Done FNP, Daryll Brod, UTERUS 08/24/2009    Qualifier: Diagnosis of  By: Roberto Scales      ACID REFLUX DISEASE 08/03/2009    Qualifier: Diagnosis of  By: Hassell Done FNP, Tori Milks  HYPERCHOLESTEROLEMIA 02/22/2009    Qualifier: Diagnosis of  By: Hassell Done FNP, Tori Milks      OBESITY 12/10/2008    Qualifier: Diagnosis of  By: Hassell Done FNP, [facility]      Tobacco use 12/10/2008    Qualifier: Diagnosis of  By: Hassell Done FNP, [facility]      Chronic pancreatitis ([place]) 12/10/2008    Qualifier: Diagnosis of  By: Hassell Done FNP, [facility]      HEART DISEASE 12/02/2008    Qualifier: Diagnosis of  By: Hassell Done FNP, [place]      OSA (obstructive sleep apnea) 12/02/2008    Qualifier: Diagnosis of  By: Hassell Done FNP, [place]      Diabetes mellitus with complication ([place]) 49/67/5916     Annotation: Check BS once daily before breakfast Qualifier: Diagnosis of  By: Hassell Done FNP, [employer]      CAD 09/25/1998    Annotation: Triple bypass 09/25/1998 08/26/2009 - percutaneous coronary intervention of the third marginal branch. Qualifier: Diagnosis of  By: Hassell Done FNP, Tori Milks       Past Surgical History:  Past Surgical History:  Procedure Laterality Date   BREAST CYST ASPIRATION     does not remember which breast   BREAST EXCISIONAL BIOPSY     does not remember which breast   CORONARY ANGIOPLASTY WITH STENT PLACEMENT  09/2009   "1"   CORONARY ARTERY BYPASS GRAFT  ~ 2000   CABG X3   HYSTEROSCOPY WITH NOVASURE N/A 09/04/2012   Procedure: HYSTEROSCOPY WITH NOVASURE;  Surgeon: Mora Bellman, MD;  Location: [facility];  Service: Gynecology;  Laterality: N/A;  with removal intrauterine device   LEEP N/A 12/25/2018   Procedure: LOOP ELECTROSURGICAL EXCISION PROCEDURE (LEEP);  Surgeon: Gae Dry, MD;  Location: [facility];  Service: Gynecology;  Laterality: N/A;   ROBOTIC ASSISTED LAPAROSCOPIC CHOLECYSTECTOMY-MULTI SITE N/A 01/14/2018   Procedure: ROBOTIC ASSISTED LAPAROSCOPIC CHOLECYSTECTOMY-MULTI SITE;  Surgeon: Jules Husbands, MD;  Location: [facility];  Service: General;  Laterality: N/A;   TUBAL LIGATION  1997    Gynecologic History:  No LMP recorded. Patient has had an ablation. Last Pap: Results were: 2020  result of LEEP showed NILM results and some focal hyperkeratosis   Last mammogram: 2023 Results were: BI-RAD I  Obstetric History: [id number]  Family History:  Family History  Problem Relation Age of Onset   Diabetes Mother    Coronary artery disease Mother    Heart disease Mother    Hyperlipidemia Mother    Sudden death Mother    Colon cancer Maternal Grandfather    Liver disease Maternal Uncle    Heart disease Maternal Uncle    Heart disease Paternal Aunt    Stroke Maternal Grandmother    Breast cancer Neg Hx     Social History:  Social History    Socioeconomic History   Marital status: Married    Spouse name: Not on file   Number of children: Not on file   Years of education: Not on file   Highest education level: Not on file  Occupational History   Occupation: Accountant  Tobacco Use   Smoking status: Every Day    Packs/day: 0.50    Years: 32.00    Total pack years: 16.00    Types: Cigarettes   Smokeless tobacco: Never  Vaping Use   Vaping Use: Never used  Substance and Sexual Activity   Alcohol use: Yes    Alcohol/week: 24.0 standard drinks of alcohol    Types: 24 Glasses of wine per week  Comment: 08/28/2016 "3-4 glasses of wine qd; last glass was last night",   Drug use: No   Sexual activity: Not Currently    Birth control/protection: Surgical  Other Topics Concern   Not on file  Social History Narrative   Not on file   Social Determinants of Health   Financial Resource Strain: Not on file  Food Insecurity: Not on file  Transportation Needs: Not on file  Physical Activity: Not on file  Stress: Not on file  Social Connections: Not on file  Intimate Partner Violence: Not on file    Allergies:  Allergies  Allergen Reactions   Penicillins Other (See Comments)    Unknown    Lisinopril Hives    Other reaction(s): hives/angiod    Medications: Prior to Admission medications   Medication Sig Start Date End Date Taking? Authorizing Provider  clopidogrel (PLAVIX) 75 MG tablet 1 tablet Orally Once a day to thin blood 11/30/20  Yes   furosemide (LASIX) 40 MG tablet Take 1 tablet (40 mg total) by mouth daily. 07/22/21  Yes Sharen Hones, MD  hydrochlorothiazide (HYDRODIURIL) 25 MG tablet 1 tablet   Yes [provider]  HYDROcodone-acetaminophen (NORCO/VICODIN) 5-325 MG tablet Take 1 tablet by mouth every 6 (six) hours as needed. 06/28/21  Yes   HYDROcodone-acetaminophen (NORCO/VICODIN) 5-325 MG tablet 1 tablet Orally every 6 hrs as needed for pain 09/19/21  Yes   metFORMIN (GLUCOPHAGE) 500 MG tablet 1  tablet with breakfast Orally Once a day for diabetes 06/21/21  Yes   metoprolol tartrate (LOPRESSOR) 100 MG tablet TAKE 1 TABLET BY MOUTH ONCE DAILY FOR BLOOD PRESSURE AND HEART 06/10/20 07/20/21  Gaynelle Arabian, MD    Physical Marie [name] Vitals: Blood pressure 118/74, height '5\' 2"'$  (1.575 m), weight 110 lb (49.9 kg).  General: NAD HEENT: normocephalic, anicteric Thyroid: no enlargement, no palpable nodules Pulmonary: some increased work of breathing, slight wheezing noted intermittently on her right side.  COPD hx, CTAB Cardiovascular: RRR, distal pulses 2+ Breast: Breast symmetrical, no tenderness, no palpable nodules or masses, no skin or nipple retraction present, no nipple discharge. Breasts are quite pendulous. No axillary or supraclavicular lymphadenopathy. Abdomen: NABS, soft, non-tender, non-distended.  Umbilicus without lesions.  No hepatomegaly, splenomegaly or masses palpable. No evidence of hernia  Genitourinary:  External: Normal external female genitalia.  Normal urethral meatus, normal Bartholin's and Skene's glands.    Vagina: Normal vaginal mucosa,  cystocele noted,   Cervix: Grossly normal in appearance, no bleeding  Uterus: Non-enlarged, mobile, normal contour.  No CMT Located to her left side.  Adnexa: ovaries non-enlarged, no adnexal masses  Rectal: deferred, rectocele noted.  Lymphatic: no evidence of inguinal lymphadenopathy Extremities: no edema, erythema, or tenderness Neurologic: Grossly intact Psychiatric: mood appropriate, affect full, PHQ 19 noted  Female chaperone present for pelvic and breast  portions of the physical Marie [name]     Assessment: Marie Marie [name] Urge and stress incontinence Testing for depression  Plan: Problem List Items Addressed This Visit   None Visit Diagnoses     Women's annual routine gynecological examination    -  Primary   Cervical cancer screening           1) Mammogram - recommend yearly screening mammogram.   Mammogram Is up to date  2) STI screening  was notoffered and therefore not obtained  3) ASCCP guidelines and rational discussed.  Patient opts for yearly screening interval due to her hx of LEEP.  4) Osteoporosis  -  per USPTF routine screening DEXA at age 55 - Per her PCP  Consider FDA-approved medical therapies in postmenopausal women and men aged 25 years and older, based on the following: a) A hip or vertebral (clinical or morphometric) fracture b) T-score ? -2.5 at the femoral neck or spine after appropriate evaluation to exclude secondary causes C) Low bone mass (T-score between -1.0 and -2.5 at the femoral neck or spine) and a 10-year probability of a hip fracture ? 3% or a 10-year probability of a major osteoporosis-related fracture ? 20% based on the [place]-adapted WHO algorithm   5) Routine healthcare maintenance including cholesterol, diabetes screening discussed managed by PCP  6) Colonoscopy Per her PCP.  Screening recommended starting at age 53 for average risk individuals, age 44 for individuals deemed at increased risk (including African Americans) and recommended to continue until age 53.  For patient age 37-85 individualized approach is recommended.  Gold standard screening is via colonoscopy, Cologuard screening is an acceptable alternative for patient unwilling or unable to undergo colonoscopy.  "Colorectal cancer screening for average?risk adults: 2018 guideline update from the American Cancer Society"[place]: A Cancer Journal for Clinicians: Aug 22, 2016   7) No follow-ups on file.  She is struggling with daily stress and urge incontinence, and had a "bladder tack" approximately 13 years ago. She is requesting a referral to U[facility]. A referral has been made to Dr. Wannetta Sender for her.  Referred to her PCP to continue dialogue on her smoking in the face of COPD. Also encouraged her to discuss her deprssive sxs with the PCP that she sees often.    Imagene Riches, CNM   10/10/2021 8:36 AM   Mosetta Pigeon, [facility] 10/10/2021, 8:35 AM

## 2021-10-11 LAB — CYTOLOGY - PAP
Adequacy: ABSENT
Comment: NEGATIVE
Diagnosis: NEGATIVE
High risk HPV: NEGATIVE

## 2021-10-12 NOTE — Progress Notes (Signed)
Cardiology Office Note:   Date:  10/13/2021  NAME:  Marie [name]    MRN: [id number] DOB:  [birth year]   PCP:  Gaynelle Arabian, MD  Cardiologist:  Ida Rogue, MD   Referring MD: Gaynelle Arabian, MD   Chief Complaint  Patient presents with   Coronary Artery Disease   History of Present Illness:   Marie [name] is a 56 y.o. female with a hx of CAD, HTN, HLD, DM who is being seen today for the evaluation of CAD at the request of Gaynelle Arabian, MD. she presents for follow-up.  Has a history of bypass surgery in 2000.  Underwent PCI to the circumflex in 2011.  She is not had any follow-up with cardiology in quite some time.  Had a stress test several years ago that was normal.  She reports that she is getting short of breath and having tightness in her chest with activity.  She is still smoking 1/2 pack/day.  She has done so for 40+ years.  She is only drinking 2 drinks per day.  She does have chronic pancreatitis.  She is trying to cut back on this.  Her EKG demonstrates sinus rhythm with nonspecific ST-T changes.  She is diabetic.  A1c well controlled.  Not on Lipitor for some reason.  We will restart this.  Blood pressure is well controlled.  Symptoms appear to be strictly exertional.  She is not exercising that much because of it.  This does need further evaluation.  She also had mild COPD in 2012.  We do need to repeat her pulmonary testing which she will do next week.  We also discussed stress testing as well as echocardiogram.  She is willing to do both of these.  She is euvolemic on exam.  She reports her daughter now has heart disease.  We discussed checking LP(a).  She has no signs of heart failure.  Cholesterol level still could be better.  Denies any symptoms at rest.  Problem List CAD s/p CABG -CABG x 3 09/1998 @ [facility]  -PCI LCX 07/2009 2. HTN 3. DM -A1c 6.3 4. HLD -T chol 173, HDL 72, LDL 86, TG 83 5. Pulmonary sarcoidosis  6. Etoh  7. Chronic pancreatitis  8.  COPD  Past Medical History: Past Medical History:  Diagnosis Date   (HFpEF) heart failure with preserved ejection fraction ([place])    a. 10/2018 EF >65% by SPECT.   Chronic pancreatitis (HCC)    Coronary atherosclerosis of unspecified type of vessel, native or graft    a. 09/2018 s/p CABG x 3; b. 07/2009 s/p PCI-->LCX; 10/2018 MV: Low risk w/ small/mild apical inf/lateral reversible defect - likley artifact. EF >65%.   CBSWHQPR(916.3)    "weekly" (07/29/2013)   Hyperlipidemia LDL goal <70    Hypertension    Leiomyoma of uterus, unspecified    Loss of weight    Obesity, unspecified    OSA (obstructive sleep apnea)    no CPAP   Pulmonary sarcoidosis (HCC)    Pure hypercholesterolemia    Shortness of breath    none since stent 2 yrs ago   Type II diabetes mellitus ([place])     Past Surgical History: Past Surgical History:  Procedure Laterality Date   BREAST CYST ASPIRATION     does not remember which breast   BREAST EXCISIONAL BIOPSY     does not remember which breast   CORONARY ANGIOPLASTY WITH STENT PLACEMENT  09/2009   "1"   CORONARY ARTERY  BYPASS GRAFT  ~ 2000   CABG X3   HYSTEROSCOPY WITH NOVASURE N/A 09/04/2012   Procedure: HYSTEROSCOPY WITH NOVASURE;  Surgeon: Mora Bellman, MD;  Location: [facility];  Service: Gynecology;  Laterality: N/A;  with removal intrauterine device   LEEP N/A 12/25/2018   Procedure: LOOP ELECTROSURGICAL EXCISION PROCEDURE (LEEP);  Surgeon: Gae Dry, MD;  Location: [facility];  Service: Gynecology;  Laterality: N/A;   ROBOTIC ASSISTED LAPAROSCOPIC CHOLECYSTECTOMY-MULTI SITE N/A 01/14/2018   Procedure: ROBOTIC ASSISTED LAPAROSCOPIC CHOLECYSTECTOMY-MULTI SITE;  Surgeon: Jules Husbands, MD;  Location: [facility];  Service: General;  Laterality: N/A;   TUBAL LIGATION  1997    Current Medications: Current Meds  Medication Sig   atorvastatin (LIPITOR) 40 MG tablet Take 1 tablet (40 mg total) by mouth daily.     Allergies:    Penicillins and Lisinopril    Social History: Social History   Socioeconomic History   Marital status: Married    Spouse name: Not on file   Number of children: Not on file   Years of education: Not on file   Highest education level: Not on file  Occupational History   Occupation: Optometrist   Occupation: Private nursing aid  Tobacco Use   Smoking status: Every Day    Packs/day: 0.50    Years: 32.00    Total pack years: 16.00    Types: Cigarettes   Smokeless tobacco: Never  Vaping Use   Vaping Use: Never used  Substance and Sexual Activity   Alcohol use: Yes    Alcohol/week: 24.0 standard drinks of alcohol    Types: 24 Glasses of wine per week    Comment: 08/28/2016 "3-4 glasses of wine qd; last glass was last night",   Drug use: No   Sexual activity: Not Currently    Birth control/protection: Surgical  Other Topics Concern   Not on file  Social History Narrative   Not on file   Social Determinants of Health   Financial Resource Strain: Not on file  Food Insecurity: Not on file  Transportation Needs: Not on file  Physical Activity: Not on file  Stress: Not on file  Social Connections: Not on file     Family History: The patient's family history includes Colon cancer in her maternal grandfather; Coronary artery disease in her mother; Diabetes in her mother; Heart disease in her maternal uncle, mother, and paternal aunt; Hyperlipidemia in her mother; Liver disease in her maternal uncle; Stroke in her maternal grandmother; Sudden death in her mother. There is no history of Breast cancer.  ROS:   All other ROS reviewed and negative. Pertinent positives noted in the HPI.     EKGs/Labs/Other Studies Reviewed:   The following studies were personally reviewed by me today:  EKG:  EKG is ordered today.  The ekg ordered today demonstrates normal sinus rhythm heart rate 95, nonspecific ST-T changes, and was personally reviewed by me.   NM Stress 11/18/2018   Low risk, probably normal pharmacologic  myocardial perfusion stress test. There is small in size, mild in severity, reversible defect involving the apical inferior and lateral segments that most likely represents artifact (misregistration and attenuation) but cannot exclude subtle ischemia. There is no evidence of significant scar. The left ventricular ejection fraction is hyperdynamic (>65%). Attenuation correction CT shows post-CABG changes and pancreatic calcifications. Aortic atherosclerosis is also noted.    Recent Labs: 07/20/2021: ALT 59; Magnesium 1.6 07/21/2021: B Natriuretic Peptide 426.4; BUN 11; Creatinine, Ser 0.90; Hemoglobin 13.2; Platelets  153; Potassium 3.8; Sodium 138   Recent Lipid Panel    Component Value Date/Time   CHOL 210 (H) 03/22/2011 1449   TRIG 99 08/13/2012 0415   HDL 52 03/22/2011 1449   CHOLHDL 4.0 03/22/2011 1449   VLDL 23 03/22/2011 1449   LDLCALC 135 (H) 03/22/2011 1449    Physical Exam:   VS:  BP 124/68   Pulse 95   Ht '5\' 2"'$  (1.575 m)   Wt 111 lb 3.2 oz (50.4 kg)   SpO2 98%   BMI 20.34 kg/m    Wt Readings from Last 3 Encounters:  10/13/21 111 lb 3.2 oz (50.4 kg)  10/10/21 110 lb (49.9 kg)  07/20/21 111 lb 3.2 oz (50.4 kg)    General: Well nourished, well developed, in no acute distress Head: Atraumatic, normal size  Eyes: PEERLA, EOMI  Neck: Supple, no JVD Endocrine: No thryomegaly Cardiac: Normal S1, S2; RRR; no murmurs, rubs, or gallops Lungs: Clear to auscultation bilaterally, no wheezing, rhonchi or rales  Abd: Soft, nontender, no hepatomegaly  Ext: No edema, pulses 2+ Musculoskeletal: No deformities, BUE and BLE strength normal and equal Skin: Warm and dry, no rashes   Neuro: Alert and oriented to person, place, time, and situation, CNII-XII grossly intact, no focal deficits  Psych: Normal mood and affect   ASSESSMENT:   Marie [name] is a 56 y.o. female who presents for the following: 1. Precordial pain   2. SOB (shortness of breath)   3. Coronary artery disease  involving coronary bypass graft of native heart without angina pectoris   4. Mixed hyperlipidemia   5. Primary hypertension   6. Tobacco abuse     PLAN:   1. Precordial pain 2. SOB (shortness of breath) -Exertional shortness of breath with chest tightness.  Either represent angina or worsening COPD.  We will proceed with a Lexiscan nuclear medicine stress test as well as echocardiogram.  I would like to check a BNP as well.  She has no signs of heart failure.  She will be given a prescription for nitroglycerin.  Blood pressure is well controlled.  She is on metoprolol.  Further recommendations including need for invasive angiography will be made based on results of her stress test.  3. Coronary artery disease involving coronary bypass graft of native heart without angina pectoris 4. Mixed hyperlipidemia -Bypass surgery in 2000.  Now with shortness of breath and exertional chest tightness.  Repeat stress test as above.  She is on Plavix which I will continue due to her young age.  We will restart Lipitor 40 mg daily.  Most recent LDL 86.  Would like her value less than 50.  Given her young age with CAD I would like to check an LP(a).  We will update her echocardiogram.  5. Primary hypertension -Well-controlled.  Continue metoprolol tartrate 100 mg.  6. Tobacco abuse -Still smoking half pack per day for nearly 40 years.  3 minutes of smoking cessation counseling was provided in office today.  Shared Decision Making/Informed Consent The risks [chest pain, shortness of breath, cardiac arrhythmias, dizziness, blood pressure fluctuations, myocardial infarction, stroke/transient ischemic attack, nausea, vomiting, allergic reaction, radiation exposure, metallic taste sensation and life-threatening complications (estimated to be 1 in 10,000)], benefits (risk stratification, diagnosing coronary artery disease, treatment guidance) and alternatives of a nuclear stress test were discussed in detail with Marie [name] and she agrees to proceed.  Disposition: Return in about 3 months (around 01/13/2022).  Medication Adjustments/Labs and Tests Ordered:  Current medicines are reviewed at length with the patient today.  Concerns regarding medicines are outlined above.  Orders Placed This Encounter  Procedures   Brain natriuretic peptide   Lipoprotein A (LPA)   MYOCARDIAL PERFUSION IMAGING   ECHOCARDIOGRAM COMPLETE   Meds ordered this encounter  Medications   atorvastatin (LIPITOR) 40 MG tablet    Sig: Take 1 tablet (40 mg total) by mouth daily.    Dispense:  90 tablet    Refill:  3    Patient Instructions  Medication Instructions:  START Lipitor 40 mg daily   *If you need a refill on your cardiac medications before your next appointment, please call your pharmacy*   Lab Work: BNP, LPa   If you have labs (blood work) drawn today and your tests are completely normal, you will receive your results only by: [place] (if you have MyChart) OR A paper copy in the mail If you have any lab test that is abnormal or we need to change your treatment, we will call you to review the results.   Testing/Procedures: Your physician has requested that you have a lexiscan myoview. For further information please visit [email or web address]. Please follow instruction sheet, as given.  Echocardiogram - Your physician has requested that you have an echocardiogram. Echocardiography is a painless test that uses sound waves to create images of your heart. It provides your doctor with information about the size and shape of your heart and how well your heart's chambers and valves are working. This procedure takes approximately one hour. There are no restrictions for this procedure.     Follow-Up: At [facility], you and your health needs are our priority.  As part of our continuing mission to provide you with exceptional heart care, we have created designated Provider Care Teams.  These Care Teams  include your primary Cardiologist (physician) and Advanced Practice Providers (APPs -  Physician Assistants and Nurse Practitioners) who all work together to provide you with the care you need, when you need it.  We recommend signing up for the patient portal called "MyChart".  Sign up information is provided on this After Visit Summary.  MyChart is used to connect with patients for Virtual Visits (Telemedicine).  Patients are able to view lab/test results, encounter notes, upcoming appointments, etc.  Non-urgent messages can be sent to your provider as well.   To learn more about what you can do with MyChart, go to [email or web address].    Your next appointment:   3 month(s)  The format for your next appointment:   In Person  Provider:   Eleonore Chiquito, MD              Signed, Addison Naegeli. Audie Box, MD, [place]  [address] [place], [place] [address] [phone]  10/13/2021 11:34 AM

## 2021-10-13 ENCOUNTER — Other Ambulatory Visit: Payer: Self-pay

## 2021-10-13 ENCOUNTER — Encounter: Payer: Self-pay | Admitting: Cardiovascular Disease

## 2021-10-13 ENCOUNTER — Ambulatory Visit (INDEPENDENT_AMBULATORY_CARE_PROVIDER_SITE_OTHER): Payer: 59 | Admitting: Cardiovascular Disease

## 2021-10-13 VITALS — BP 124/68 | HR 95 | Ht 62.0 in | Wt 111.2 lb

## 2021-10-13 DIAGNOSIS — R072 Precordial pain: Secondary | ICD-10-CM

## 2021-10-13 DIAGNOSIS — E782 Mixed hyperlipidemia: Secondary | ICD-10-CM | POA: Diagnosis not present

## 2021-10-13 DIAGNOSIS — I10 Essential (primary) hypertension: Secondary | ICD-10-CM

## 2021-10-13 DIAGNOSIS — R0602 Shortness of breath: Secondary | ICD-10-CM

## 2021-10-13 DIAGNOSIS — I25810 Atherosclerosis of coronary artery bypass graft(s) without angina pectoris: Secondary | ICD-10-CM

## 2021-10-13 DIAGNOSIS — F17210 Nicotine dependence, cigarettes, uncomplicated: Secondary | ICD-10-CM | POA: Diagnosis not present

## 2021-10-13 DIAGNOSIS — Z720 Tobacco use: Secondary | ICD-10-CM

## 2021-10-13 MED ORDER — ATORVASTATIN CALCIUM 40 MG PO TABS
40.0000 mg | ORAL_TABLET | Freq: Every day | ORAL | 3 refills | Status: DC
  Filled 2021-10-13: qty 90, 90d supply, fill #0
  Filled 2022-01-10: qty 90, 90d supply, fill #1

## 2021-10-13 NOTE — Patient Instructions (Signed)
Medication Instructions:  START Lipitor 40 mg daily   *If you need a refill on your cardiac medications before your next appointment, please call your pharmacy*   Lab Work: BNP, LPa   If you have labs (blood work) drawn today and your tests are completely normal, you will receive your results only by: [place] (if you have MyChart) OR A paper copy in the mail If you have any lab test that is abnormal or we need to change your treatment, we will call you to review the results.   Testing/Procedures: Your physician has requested that you have a lexiscan myoview. For further information please visit [email or web address]. Please follow instruction sheet, as given.  Echocardiogram - Your physician has requested that you have an echocardiogram. Echocardiography is a painless test that uses sound waves to create images of your heart. It provides your doctor with information about the size and shape of your heart and how well your heart's chambers and valves are working. This procedure takes approximately one hour. There are no restrictions for this procedure.     Follow-Up: At [facility], you and your health needs are our priority.  As part of our continuing mission to provide you with exceptional heart care, we have created designated Provider Care Teams.  These Care Teams include your primary Cardiologist (physician) and Advanced Practice Providers (APPs -  Physician Assistants and Nurse Practitioners) who all work together to provide you with the care you need, when you need it.  We recommend signing up for the patient portal called "MyChart".  Sign up information is provided on this After Visit Summary.  MyChart is used to connect with patients for Virtual Visits (Telemedicine).  Patients are able to view lab/test results, encounter notes, upcoming appointments, etc.  Non-urgent messages can be sent to your provider as well.   To learn more about what you can do with MyChart, go to  [email or web address].    Your next appointment:   3 month(s)  The format for your next appointment:   In Person  Provider:   Eleonore Chiquito, MD

## 2021-10-16 ENCOUNTER — Encounter: Payer: Self-pay | Admitting: Pulmonary Disease

## 2021-10-16 ENCOUNTER — Ambulatory Visit: Payer: 59 | Admitting: Pulmonary Disease

## 2021-10-16 ENCOUNTER — Ambulatory Visit (INDEPENDENT_AMBULATORY_CARE_PROVIDER_SITE_OTHER): Payer: 59 | Admitting: Pulmonary Disease

## 2021-10-16 VITALS — BP 110/60 | HR 74 | Temp 98.3°F | Ht 62.0 in | Wt 109.6 lb

## 2021-10-16 DIAGNOSIS — G4733 Obstructive sleep apnea (adult) (pediatric): Secondary | ICD-10-CM | POA: Diagnosis not present

## 2021-10-16 DIAGNOSIS — F17210 Nicotine dependence, cigarettes, uncomplicated: Secondary | ICD-10-CM | POA: Diagnosis not present

## 2021-10-16 DIAGNOSIS — J432 Centrilobular emphysema: Secondary | ICD-10-CM | POA: Diagnosis not present

## 2021-10-16 LAB — PULMONARY FUNCTION TEST
DL/VA % pred: 47 %
DL/VA: 2.04 ml/min/mmHg/L
DLCO cor % pred: 32 %
DLCO cor: 6.09 ml/min/mmHg
DLCO unc % pred: 32 %
DLCO unc: 6.09 ml/min/mmHg
FEF 25-75 Post: 0.9 L/sec
FEF 25-75 Pre: 0.74 L/sec
FEF2575-%Change-Post: 21 %
FEF2575-%Pred-Post: 37 %
FEF2575-%Pred-Pre: 30 %
FEV1-%Change-Post: 4 %
FEV1-%Pred-Post: 55 %
FEV1-%Pred-Pre: 53 %
FEV1-Post: 1.37 L
FEV1-Pre: 1.31 L
FEV1FVC-%Change-Post: 4 %
FEV1FVC-%Pred-Pre: 85 %
FEV6-%Change-Post: 0 %
FEV6-%Pred-Post: 63 %
FEV6-%Pred-Pre: 63 %
FEV6-Post: 1.93 L
FEV6-Pre: 1.92 L
FEV6FVC-%Change-Post: 0 %
FEV6FVC-%Pred-Post: 103 %
FEV6FVC-%Pred-Pre: 102 %
FVC-%Change-Post: 0 %
FVC-%Pred-Post: 61 %
FVC-%Pred-Pre: 61 %
FVC-Post: 1.93 L
FVC-Pre: 1.93 L
Post FEV1/FVC ratio: 71 %
Post FEV6/FVC ratio: 100 %
Pre FEV1/FVC ratio: 68 %
Pre FEV6/FVC Ratio: 100 %
RV % pred: 96 %
RV: 1.7 L
TLC % pred: 79 %
TLC: 3.73 L

## 2021-10-16 NOTE — Progress Notes (Deleted)
Synopsis: Referred in April 2023 for Emphysema  Subjective:   PATIENT ID: Marie [name] GENDER: female DOB: 1965-12-31, MRN: [id number]   HPI  No chief complaint on file.  Marie [name] is a 56 year old woman, daily smoker with history of sarcoidosis, HFpEF, OSA not on CPAP, hypertension and DM II who returns to pulmonary clinic for emphysema.   Smoking? Sleep study? PFTs today show  Anoro? Albuterol?  Initial OV 07/12/21 Marie was admitted for covid 19 10/2020 and CT Chest from that time showed mild centrilobular emphysema.   PFTs from 2015 showed moderate obstructive defect and severe diffusion defect.   Marie has dyspnea with exertion and has night time awakenings due to shortness of breath. Marie has history of sleep apnea and is not on CPAP due to insurance issues years ago. Marie is interested in having an updated sleep study to resume CPAP if needed. Marie has daily cough with sputum production in the mornings. Marie has intermittent wheezing. Marie denies issues with allergies. Marie has trouble with [name] breathing on hot humid days.   Marie is smoking half a pack per day. Marie has been smoking since age 61 and was previously smoking 1 pack per day. Marie works as a Set designer. Marie denies any harmful dust or chemical exposures. Marie lives with [name] husband and dog.    Past Medical History:  Diagnosis Date   (HFpEF) heart failure with preserved ejection fraction ([place])    a. 10/2018 EF >65% by SPECT.   Chronic pancreatitis (HCC)    Coronary atherosclerosis of unspecified type of vessel, native or graft    a. 09/2018 s/p CABG x 3; b. 07/2009 s/p PCI-->LCX; 10/2018 MV: Low risk w/ small/mild apical inf/lateral reversible defect - likley artifact. EF >65%.   OVFIEPPI(951.8)    "weekly" (07/29/2013)   Hyperlipidemia LDL goal <70    Hypertension    Leiomyoma of uterus, unspecified    Loss of weight    Obesity, unspecified    OSA (obstructive sleep apnea)    no CPAP   Pulmonary sarcoidosis (HCC)     Pure hypercholesterolemia    Shortness of breath    none since stent 2 yrs ago   Type II diabetes mellitus ([place])      Family History  Problem Relation Age of Onset   Diabetes Mother    Coronary artery disease Mother    Heart disease Mother    Hyperlipidemia Mother    Sudden death Mother    Colon cancer Maternal Grandfather    Liver disease Maternal Uncle    Heart disease Maternal Uncle    Heart disease Paternal Aunt    Stroke Maternal Grandmother    Breast cancer Neg Hx      Social History   Socioeconomic History   Marital status: Married    Spouse name: Not on file   Number of children: Not on file   Years of education: Not on file   Highest education level: Not on file  Occupational History   Occupation: Optometrist   Occupation: Private nursing aid  Tobacco Use   Smoking status: Every Day    Packs/day: 0.50    Years: 32.00    Total pack years: 16.00    Types: Cigarettes   Smokeless tobacco: Never  Vaping Use   Vaping Use: Never used  Substance and Sexual Activity   Alcohol use: Yes    Alcohol/week: 24.0 standard drinks of alcohol    Types: 24 Glasses of  wine per week    Comment: 08/28/2016 "3-4 glasses of wine qd; last glass was last night",   Drug use: No   Sexual activity: Not Currently    Birth control/protection: Surgical  Other Topics Concern   Not on file  Social History Narrative   Not on file   Social Determinants of Health   Financial Resource Strain: Not on file  Food Insecurity: Not on file  Transportation Needs: Not on file  Physical Activity: Not on file  Stress: Not on file  Social Connections: Not on file  Intimate Partner Violence: Not on file     Allergies  Allergen Reactions   Penicillins Other (See Comments)    Unknown    Lisinopril Hives    Other reaction(s): hives/angiod     Outpatient Medications Prior to Visit  Medication Sig Dispense Refill   atorvastatin (LIPITOR) 40 MG tablet Take 1 tablet (40 mg total) by mouth  daily. 90 tablet 3   clopidogrel (PLAVIX) 75 MG tablet 1 tablet Orally Once a day to thin blood 90 tablet 2   furosemide (LASIX) 40 MG tablet Take 1 tablet (40 mg total) by mouth daily. 30 tablet 0   hydrochlorothiazide (HYDRODIURIL) 25 MG tablet 1 tablet     HYDROcodone-acetaminophen (NORCO/VICODIN) 5-325 MG tablet Take 1 tablet by mouth every 6 (six) hours as needed. 24 tablet 0   HYDROcodone-acetaminophen (NORCO/VICODIN) 5-325 MG tablet 1 tablet Orally every 6 hrs as needed for pain 24 tablet 0   metFORMIN (GLUCOPHAGE) 500 MG tablet 1 tablet with breakfast Orally Once a day for diabetes 90 tablet 2   metoprolol tartrate (LOPRESSOR) 100 MG tablet TAKE 1 TABLET BY MOUTH ONCE DAILY FOR BLOOD PRESSURE AND HEART 90 tablet 2   No facility-administered medications prior to visit.   Review of Systems  Constitutional:  Negative for chills, fever, malaise/fatigue and weight loss.  HENT:  Negative for congestion, sinus pain and sore throat.   Eyes: Negative.   Respiratory:  Positive for cough, sputum production, shortness of breath and wheezing. Negative for hemoptysis.   Cardiovascular:  Negative for chest pain, palpitations, orthopnea, claudication and leg swelling.  Gastrointestinal:  Negative for abdominal pain, heartburn, nausea and vomiting.  Genitourinary: Negative.   Musculoskeletal:  Negative for joint pain and myalgias.  Skin:  Negative for rash.  Neurological:  Negative for weakness.  Endo/Heme/Allergies: Negative.   Psychiatric/Behavioral: Negative.      Objective:   There were no vitals filed for this visit.  Physical Exam Constitutional:      General: Marie is not in acute distress.    Appearance: Marie is not ill-appearing.  HENT:     Head: Normocephalic and atraumatic.  Eyes:     General: No scleral icterus.    Conjunctiva/sclera: Conjunctivae normal.     Pupils: Pupils are equal, round, and reactive to light.  Cardiovascular:     Rate and Rhythm: Normal rate and regular  rhythm.     Pulses: Normal pulses.     Heart sounds: Normal heart sounds. No murmur heard. Pulmonary:     Effort: Pulmonary effort is normal.     Breath sounds: Normal breath sounds. No wheezing, rhonchi or rales.  Abdominal:     General: Bowel sounds are normal.     Palpations: Abdomen is soft.  Musculoskeletal:     Right lower leg: No edema.     Left lower leg: No edema.  Lymphadenopathy:     Cervical: No cervical adenopathy.  Skin:  General: Skin is warm and dry.  Neurological:     General: No focal deficit present.     Mental Status: Marie is alert.  Psychiatric:        Mood and Affect: Mood normal.        Behavior: Behavior normal.        Thought Content: Thought content normal.        Judgment: Judgment normal.    CBC    Component Value Date/Time   WBC 4.2 07/21/2021 0526   RBC 4.22 07/21/2021 0526   HGB 13.2 07/21/2021 0526   HGB 15.6 10/22/2005 1110   HCT 41.7 07/21/2021 0526   HCT 47.0 (H) 10/22/2005 1110   PLT 153 07/21/2021 0526   PLT 219 10/22/2005 1110   MCV 98.8 07/21/2021 0526   MCV 94.9 02/24/2015 1405   MCV 88 10/22/2005 1110   MCH 31.3 07/21/2021 0526   MCHC 31.7 07/21/2021 0526   RDW 12.3 07/21/2021 0526   RDW 13.2 10/22/2005 1110   LYMPHSABS 2.4 04/03/2021 2313   LYMPHSABS 2.0 10/22/2005 1110   MONOABS 0.7 04/03/2021 2313   EOSABS 0.0 04/03/2021 2313   EOSABS 0.1 10/22/2005 1110   BASOSABS 0.0 04/03/2021 2313   BASOSABS 0.1 10/22/2005 1110      Latest Ref Rng & Units 07/21/2021    5:26 AM 07/20/2021    9:12 AM 04/03/2021   11:13 PM  BMP  Glucose 70 - 99 mg/dL 88  90  116   BUN 6 - 20 mg/dL '11  6  11   '$ Creatinine 0.44 - 1.00 mg/dL 0.90  0.71  0.57   Sodium 135 - 145 mmol/L 138  140  134   Potassium 3.5 - 5.1 mmol/L 3.8  4.5  3.8   Chloride 98 - 111 mmol/L 107  106  100   CO2 22 - 32 mmol/L '26  26  25   '$ Calcium 8.9 - 10.3 mg/dL 8.5  9.1  9.2    Chest imaging: CTA Chest 11/10/20 1. No evidence of pulmonary embolism. 2. Moderate  severity posterior bibasilar atelectasis and/or infiltrate. 3. Centrilobular emphysema with mild to moderate severity bilateral upper lobe linear scarring and/or atelectasis. 4. Stable area of patchy appearing focal scarring along the posterior aspect of the lung base. 5. Findings consistent with chronic pancreatitis.  HRCT Chest 2015 1. Since 03/23/2010, similar findings related to chronic pulmonary  sarcoidosis. Progression of concurrent centrilobular emphysema.  2. Similar calcified thoracic adenopathy, consistent with  sarcoidosis.  3. Interval development of chronic calcific pancreatitis,  incompletely imaged.  4. Markedly age advanced atherosclerosis, status post median  sternotomy.   PFT:     No data to display         2012: Moderate obstruction and severe diffusion defect  Labs:  Path:  Echo:  Heart Catheterization:  Assessment & Plan:   No diagnosis found.  Discussion: Shronda Boeh is a 56 year old woman, daily smoker with history of sarcoidosis, HFpEF, OSA not on CPAP, hypertension and DM II who is referred to pulmonary clinic for emphysema.   Marie has moderate obstructive defect and severe diffusion defect based on PFTs from 2015.   Marie is to start anoro ellipta 1 puff daily for [name] emphysema. Marie can continue as needed albuterol.   We discussed smoking cessation options. Given [name] metoprolol there is interaction risks with wellbutrin and Marie would like to avoid chantix due to side effects. Marie is to start nicotine patches '7mg'$  daily and  nicotine lozenges as needed.   We will order a home sleep study for [name] history of OSA and on going symptoms of snoring and variable breathing at night.   Follow up in 3 months with pulmonary function tests. Marie will need referral to lung cancer screening team at next visit.   Freda Jackson, MD [facility] Office: [phone]   Current Outpatient Medications:    atorvastatin (LIPITOR) 40 MG  tablet, Take 1 tablet (40 mg total) by mouth daily., Disp: 90 tablet, Rfl: 3   clopidogrel (PLAVIX) 75 MG tablet, 1 tablet Orally Once a day to thin blood, Disp: 90 tablet, Rfl: 2   furosemide (LASIX) 40 MG tablet, Take 1 tablet (40 mg total) by mouth daily., Disp: 30 tablet, Rfl: 0   hydrochlorothiazide (HYDRODIURIL) 25 MG tablet, 1 tablet, Disp: , Rfl:    HYDROcodone-acetaminophen (NORCO/VICODIN) 5-325 MG tablet, Take 1 tablet by mouth every 6 (six) hours as needed., Disp: 24 tablet, Rfl: 0   HYDROcodone-acetaminophen (NORCO/VICODIN) 5-325 MG tablet, 1 tablet Orally every 6 hrs as needed for pain, Disp: 24 tablet, Rfl: 0   metFORMIN (GLUCOPHAGE) 500 MG tablet, 1 tablet with breakfast Orally Once a day for diabetes, Disp: 90 tablet, Rfl: 2   metoprolol tartrate (LOPRESSOR) 100 MG tablet, TAKE 1 TABLET BY MOUTH ONCE DAILY FOR BLOOD PRESSURE AND HEART, Disp: 90 tablet, Rfl: 2

## 2021-10-16 NOTE — Patient Instructions (Addendum)
Your breathing tests show moderate obstruction and severe diffusion defect.   Continue stiolto inhaler 2 puffs daily  We will check a home sleep study based on your history of sleep apnea.  For smoking cessation: - Use nicotine patches '7mg'$  daily and mini nicotine lozenges as needed for break through cravings.   We will refer you to our lung cancer screening clinic.   Follow up in 6 months

## 2021-10-16 NOTE — Progress Notes (Signed)
PFT done today. 

## 2021-10-16 NOTE — Progress Notes (Signed)
Synopsis: Referred in April 2023 for Emphysema  Subjective:   PATIENT ID: Marie [name] GENDER: female DOB: 12/20/1965, MRN: [id number]  HPI  Chief Complaint  Patient presents with   Follow-up    Follow-up PFT done today, SOB, Wheezing, cough   Marie [name] is a 56 year old woman, daily smoker with history of sarcoidosis, HFpEF, OSA not on CPAP, hypertension and DM II who returns to pulmonary clinic for emphysema.    She continues to smoke half a pack per day. She is using stiolto as needed and is taking it 1-2 times per week. She is not using albuterol.   PFTs today show moderate obstruction and severe diffusion defect.   Initial OV 07/12/21 She was admitted for covid 19 10/2020 and CT Chest from that time showed mild centrilobular emphysema.   PFTs from 2015 showed moderate obstructive defect and severe diffusion defect.   She has dyspnea with exertion and has night time awakenings due to shortness of breath. She has history of sleep apnea and is not on CPAP due to insurance issues years ago. She is interested in having an updated sleep study to resume CPAP if needed. She has daily cough with sputum production in the mornings. She has intermittent wheezing. She denies issues with allergies. She has trouble with her breathing on hot humid days.   She is smoking half a pack per day. She has been smoking since age 13 and was previously smoking 1 pack per day. She works as a Set designer. She denies any harmful dust or chemical exposures. She lives with her husband and dog.    Past Medical History:  Diagnosis Date   (HFpEF) heart failure with preserved ejection fraction ([place])    a. 10/2018 EF >65% by SPECT.   Chronic pancreatitis (HCC)    Coronary atherosclerosis of unspecified type of vessel, native or graft    a. 09/2018 s/p CABG x 3; b. 07/2009 s/p PCI-->LCX; 10/2018 MV: Low risk w/ small/mild apical inf/lateral reversible defect - likley artifact. EF >65%.   NIDPOEUM(353.6)     "weekly" (07/29/2013)   Hyperlipidemia LDL goal <70    Hypertension    Leiomyoma of uterus, unspecified    Loss of weight    Obesity, unspecified    OSA (obstructive sleep apnea)    no CPAP   Pulmonary sarcoidosis (HCC)    Pure hypercholesterolemia    Shortness of breath    none since stent 2 yrs ago   Type II diabetes mellitus ([place])      Family History  Problem Relation Age of Onset   Diabetes Mother    Coronary artery disease Mother    Heart disease Mother    Hyperlipidemia Mother    Sudden death Mother    Colon cancer Maternal Grandfather    Liver disease Maternal Uncle    Heart disease Maternal Uncle    Heart disease Paternal Aunt    Stroke Maternal Grandmother    Breast cancer Neg Hx      Social History   Socioeconomic History   Marital status: Married    Spouse name: Not on file   Number of children: Not on file   Years of education: Not on file   Highest education level: Not on file  Occupational History   Occupation: Optometrist   Occupation: Private nursing aid  Tobacco Use   Smoking status: Every Day    Packs/day: 0.50    Years: 32.00    Total pack years:  16.00    Types: Cigarettes   Smokeless tobacco: Never  Vaping Use   Vaping Use: Never used  Substance and Sexual Activity   Alcohol use: Yes    Alcohol/week: 24.0 standard drinks of alcohol    Types: 24 Glasses of wine per week    Comment: 08/28/2016 "3-4 glasses of wine qd; last glass was last night",   Drug use: No   Sexual activity: Not Currently    Birth control/protection: Surgical  Other Topics Concern   Not on file  Social History Narrative   Not on file   Social Determinants of Health   Financial Resource Strain: Not on file  Food Insecurity: Not on file  Transportation Needs: Not on file  Physical Activity: Not on file  Stress: Not on file  Social Connections: Not on file  Intimate Partner Violence: Not on file     Allergies  Allergen Reactions   Penicillins Other (See  Comments)    Unknown    Lisinopril Hives    Other reaction(s): hives/angiod     Outpatient Medications Prior to Visit  Medication Sig Dispense Refill   atorvastatin (LIPITOR) 40 MG tablet Take 1 tablet (40 mg total) by mouth daily. 90 tablet 3   clopidogrel (PLAVIX) 75 MG tablet 1 tablet Orally Once a day to thin blood 90 tablet 2   furosemide (LASIX) 40 MG tablet Take 1 tablet (40 mg total) by mouth daily. 30 tablet 0   hydrochlorothiazide (HYDRODIURIL) 25 MG tablet 1 tablet     HYDROcodone-acetaminophen (NORCO/VICODIN) 5-325 MG tablet Take 1 tablet by mouth every 6 (six) hours as needed. 24 tablet 0   HYDROcodone-acetaminophen (NORCO/VICODIN) 5-325 MG tablet 1 tablet Orally every 6 hrs as needed for pain 24 tablet 0   metFORMIN (GLUCOPHAGE) 500 MG tablet 1 tablet with breakfast Orally Once a day for diabetes 90 tablet 2   metoprolol tartrate (LOPRESSOR) 100 MG tablet TAKE 1 TABLET BY MOUTH ONCE DAILY FOR BLOOD PRESSURE AND HEART 90 tablet 2   No facility-administered medications prior to visit.   Review of Systems  Constitutional:  Negative for chills, fever, malaise/fatigue and weight loss.  HENT:  Negative for congestion, sinus pain and sore throat.   Eyes: Negative.   Respiratory:  Positive for cough, sputum production, shortness of breath and wheezing. Negative for hemoptysis.   Cardiovascular:  Negative for chest pain, palpitations, orthopnea, claudication and leg swelling.  Gastrointestinal:  Negative for abdominal pain, heartburn, nausea and vomiting.  Genitourinary: Negative.   Musculoskeletal:  Negative for joint pain and myalgias.  Skin:  Negative for rash.  Neurological:  Negative for weakness.  Endo/Heme/Allergies: Negative.   Psychiatric/Behavioral: Negative.      Objective:   Vitals:   10/16/21 1328  BP: 110/60  Pulse: 74  Temp: 98.3 F (36.8 C)  TempSrc: Oral  SpO2: (!) 88%  Weight: 109 lb 9.6 oz (49.7 kg)  Height: '5\' 2"'$  (1.575 m)   Physical  Exam Constitutional:      General: She is not in acute distress.    Appearance: She is not ill-appearing.  HENT:     Head: Normocephalic and atraumatic.  Eyes:     General: No scleral icterus. Cardiovascular:     Rate and Rhythm: Normal rate and regular rhythm.     Pulses: Normal pulses.     Heart sounds: Normal heart sounds. No murmur heard. Pulmonary:     Effort: Pulmonary effort is normal.     Breath sounds: Normal breath  sounds. No wheezing, rhonchi or rales.  Musculoskeletal:     Right lower leg: No edema.     Left lower leg: No edema.  Skin:    General: Skin is warm and dry.  Neurological:     General: No focal deficit present.     Mental Status: She is alert.  Psychiatric:        Mood and Affect: Mood normal.        Behavior: Behavior normal.        Thought Content: Thought content normal.        Judgment: Judgment normal.    CBC    Component Value Date/Time   WBC 4.2 07/21/2021 0526   RBC 4.22 07/21/2021 0526   HGB 13.2 07/21/2021 0526   HGB 15.6 10/22/2005 1110   HCT 41.7 07/21/2021 0526   HCT 47.0 (H) 10/22/2005 1110   PLT 153 07/21/2021 0526   PLT 219 10/22/2005 1110   MCV 98.8 07/21/2021 0526   MCV 94.9 02/24/2015 1405   MCV 88 10/22/2005 1110   MCH 31.3 07/21/2021 0526   MCHC 31.7 07/21/2021 0526   RDW 12.3 07/21/2021 0526   RDW 13.2 10/22/2005 1110   LYMPHSABS 2.4 04/03/2021 2313   LYMPHSABS 2.0 10/22/2005 1110   MONOABS 0.7 04/03/2021 2313   EOSABS 0.0 04/03/2021 2313   EOSABS 0.1 10/22/2005 1110   BASOSABS 0.0 04/03/2021 2313   BASOSABS 0.1 10/22/2005 1110      Latest Ref Rng & Units 07/21/2021    5:26 AM 07/20/2021    9:12 AM 04/03/2021   11:13 PM  BMP  Glucose 70 - 99 mg/dL 88  90  116   BUN 6 - 20 mg/dL '11  6  11   '$ Creatinine 0.44 - 1.00 mg/dL 0.90  0.71  0.57   Sodium 135 - 145 mmol/L 138  140  134   Potassium 3.5 - 5.1 mmol/L 3.8  4.5  3.8   Chloride 98 - 111 mmol/L 107  106  100   CO2 22 - 32 mmol/L '26  26  25   '$ Calcium 8.9 - 10.3  mg/dL 8.5  9.1  9.2    Chest imaging: CTA Chest 11/10/20 1. No evidence of pulmonary embolism. 2. Moderate severity posterior bibasilar atelectasis and/or infiltrate. 3. Centrilobular emphysema with mild to moderate severity bilateral upper lobe linear scarring and/or atelectasis. 4. Stable area of patchy appearing focal scarring along the posterior aspect of the lung base. 5. Findings consistent with chronic pancreatitis.  HRCT Chest 2015 1. Since 03/23/2010, similar findings related to chronic pulmonary  sarcoidosis. Progression of concurrent centrilobular emphysema.  2. Similar calcified thoracic adenopathy, consistent with  sarcoidosis.  3. Interval development of chronic calcific pancreatitis,  incompletely imaged.  4. Markedly age advanced atherosclerosis, status post median  sternotomy.   PFT:    Latest Ref Rng & Units 10/16/2021   12:00 PM  PFT Results  FVC-Pre L 1.93  P  FVC-Predicted Pre % 61  P  FVC-Post L 1.93  P  FVC-Predicted Post % 61  P  Pre FEV1/FVC % % 68  P  Post FEV1/FCV % % 71  P  FEV1-Pre L 1.31  P  FEV1-Predicted Pre % 53  P  FEV1-Post L 1.37  P  DLCO uncorrected ml/min/mmHg 6.09  P  DLCO UNC% % 32  P  DLCO corrected ml/min/mmHg 6.09  P  DLCO COR %Predicted % 32  P  DLVA Predicted % 47  P  TLC  L 3.73  P  TLC % Predicted % 79  P  RV % Predicted % 96  P    P Preliminary result  2012: Moderate obstruction and severe diffusion defect  Labs:  Path:  Echo:  Heart Catheterization:  Assessment & Plan:   No diagnosis found.  Discussion: Jowana Thumma is a 56 year old woman, daily smoker with history of sarcoidosis, HFpEF, OSA not on CPAP, hypertension and DM II who is referred to pulmonary clinic for emphysema.   She has moderate obstructive defect and severe diffusion defect based on PFTs today which is similar to her prior studies.  She is to continue stiolto 2 puffs daily for her emphysema. I reviewed with her proper inhaler technique  for the stiolto. She can continue as needed albuterol.   We discussed smoking cessation options. Given her metoprolol there is interaction risks with wellbutrin and she would like to avoid chantix due to side effects. She is to start nicotine patches '7mg'$  daily and nicotine lozenges as needed.   We will order a home sleep study for her history of OSA and on going symptoms of snoring and variable breathing at night.   Referral to lung cancer screening program placed.   Follow up in 6 months.   Freda Jackson, MD [facility] Office: [phone]   Current Outpatient Medications:    atorvastatin (LIPITOR) 40 MG tablet, Take 1 tablet (40 mg total) by mouth daily., Disp: 90 tablet, Rfl: 3   clopidogrel (PLAVIX) 75 MG tablet, 1 tablet Orally Once a day to thin blood, Disp: 90 tablet, Rfl: 2   furosemide (LASIX) 40 MG tablet, Take 1 tablet (40 mg total) by mouth daily., Disp: 30 tablet, Rfl: 0   hydrochlorothiazide (HYDRODIURIL) 25 MG tablet, 1 tablet, Disp: , Rfl:    HYDROcodone-acetaminophen (NORCO/VICODIN) 5-325 MG tablet, Take 1 tablet by mouth every 6 (six) hours as needed., Disp: 24 tablet, Rfl: 0   HYDROcodone-acetaminophen (NORCO/VICODIN) 5-325 MG tablet, 1 tablet Orally every 6 hrs as needed for pain, Disp: 24 tablet, Rfl: 0   metFORMIN (GLUCOPHAGE) 500 MG tablet, 1 tablet with breakfast Orally Once a day for diabetes, Disp: 90 tablet, Rfl: 2   metoprolol tartrate (LOPRESSOR) 100 MG tablet, TAKE 1 TABLET BY MOUTH ONCE DAILY FOR BLOOD PRESSURE AND HEART, Disp: 90 tablet, Rfl: 2

## 2021-10-17 ENCOUNTER — Other Ambulatory Visit: Payer: Self-pay

## 2021-10-17 LAB — BRAIN NATRIURETIC PEPTIDE: BNP: 75.4 pg/mL (ref 0.0–100.0)

## 2021-10-17 LAB — LIPOPROTEIN A (LPA): Lipoprotein (a): 103.5 nmol/L — ABNORMAL HIGH (ref <75.0)

## 2021-10-17 MED ORDER — HYDROCODONE-ACETAMINOPHEN 5-325 MG PO TABS
ORAL_TABLET | ORAL | 0 refills | Status: DC
  Filled 2021-10-17: qty 24, 6d supply, fill #0

## 2021-10-17 MED ORDER — LOSARTAN POTASSIUM 100 MG PO TABS
ORAL_TABLET | ORAL | 1 refills | Status: DC
  Filled 2021-10-17: qty 90, 90d supply, fill #0
  Filled 2022-01-10: qty 90, 90d supply, fill #1

## 2021-10-17 MED ORDER — CLOPIDOGREL BISULFATE 75 MG PO TABS
ORAL_TABLET | ORAL | 1 refills | Status: DC
  Filled 2021-10-17: qty 90, 90d supply, fill #0
  Filled 2022-01-10: qty 90, 90d supply, fill #1

## 2021-10-17 MED ORDER — HYDROCHLOROTHIAZIDE 25 MG PO TABS
ORAL_TABLET | ORAL | 1 refills | Status: DC
  Filled 2021-10-17: qty 90, 90d supply, fill #0
  Filled 2022-01-10: qty 90, 90d supply, fill #1

## 2021-10-17 MED ORDER — METOPROLOL TARTRATE 100 MG PO TABS
ORAL_TABLET | ORAL | 1 refills | Status: DC
  Filled 2021-10-17: qty 90, 90d supply, fill #0
  Filled 2022-01-10: qty 90, 90d supply, fill #1

## 2021-10-17 MED ORDER — METFORMIN HCL 500 MG PO TABS
ORAL_TABLET | ORAL | 1 refills | Status: DC
  Filled 2021-10-17: qty 90, 90d supply, fill #0
  Filled 2022-01-10: qty 90, 90d supply, fill #1

## 2021-10-18 ENCOUNTER — Encounter: Payer: Self-pay | Admitting: Obstetrics

## 2021-10-18 NOTE — Addendum Note (Signed)
Addended by: Jacqulynn Cadet on: 10/18/2021 11:56 AM   Modules accepted: Orders

## 2021-10-21 ENCOUNTER — Encounter: Payer: Self-pay | Admitting: Pulmonary Disease

## 2021-10-24 ENCOUNTER — Telehealth (HOSPITAL_COMMUNITY): Payer: Self-pay | Admitting: Radiology

## 2021-10-24 NOTE — Telephone Encounter (Signed)
Left detailed instructions per Myocardial Perfusion Study Information Sheet for the test on 8/8 at 10:00. Patient notified to arrive 15 minutes early and that it is imperative to arrive on time for appointment to keep from having the test rescheduled.  If you need to cancel or reschedule your appointment, please call the office within 24 hours of your appointment. . Patient verbalized understanding.EHK

## 2021-10-31 ENCOUNTER — Encounter (HOSPITAL_COMMUNITY): Payer: Self-pay | Admitting: *Deleted

## 2021-10-31 ENCOUNTER — Telehealth (HOSPITAL_COMMUNITY): Payer: Self-pay | Admitting: *Deleted

## 2021-10-31 ENCOUNTER — Ambulatory Visit (HOSPITAL_COMMUNITY): Payer: 59 | Attending: Internal Medicine

## 2021-10-31 ENCOUNTER — Ambulatory Visit (HOSPITAL_BASED_OUTPATIENT_CLINIC_OR_DEPARTMENT_OTHER): Payer: 59

## 2021-10-31 DIAGNOSIS — R072 Precordial pain: Secondary | ICD-10-CM

## 2021-10-31 DIAGNOSIS — R0602 Shortness of breath: Secondary | ICD-10-CM | POA: Diagnosis not present

## 2021-10-31 LAB — ECHOCARDIOGRAM COMPLETE
Area-P 1/2: 4.36 cm2
Height: 62 in
S' Lateral: 2.3 cm
Weight: 1744 oz

## 2021-10-31 MED ORDER — TECHNETIUM TC 99M TETROFOSMIN IV KIT
11.0000 | PACK | Freq: Once | INTRAVENOUS | Status: AC | PRN
  Administered 2021-10-31: 11 via INTRAVENOUS

## 2021-10-31 NOTE — Telephone Encounter (Signed)
Letter outlining instructions for upcoming test on 11/06/21 handed to patient in the office.

## 2021-11-06 ENCOUNTER — Ambulatory Visit (HOSPITAL_COMMUNITY): Payer: 59 | Attending: Cardiology

## 2021-11-06 LAB — MYOCARDIAL PERFUSION IMAGING
LV dias vol: 41 mL (ref 46–106)
LV sys vol: 13 mL
Nuc Stress EF: 68 %
Peak HR: 112 {beats}/min
Rest HR: 71 {beats}/min
Rest Nuclear Isotope Dose: 11 mCi
SDS: 3
SRS: 2
SSS: 3
Stress Nuclear Isotope Dose: 26.8 mCi
TID: 0.86

## 2021-11-14 ENCOUNTER — Encounter: Payer: Self-pay | Admitting: Obstetrics and Gynecology

## 2021-11-15 ENCOUNTER — Other Ambulatory Visit: Payer: Self-pay

## 2021-11-15 MED ORDER — HYDROCODONE-ACETAMINOPHEN 5-325 MG PO TABS
ORAL_TABLET | ORAL | 0 refills | Status: DC
  Filled 2021-11-15: qty 24, 6d supply, fill #0

## 2021-11-28 ENCOUNTER — Other Ambulatory Visit: Payer: Medicaid Other

## 2021-12-13 ENCOUNTER — Other Ambulatory Visit: Payer: Self-pay

## 2021-12-15 ENCOUNTER — Other Ambulatory Visit: Payer: Self-pay

## 2021-12-15 MED ORDER — HYDROCODONE-ACETAMINOPHEN 5-325 MG PO TABS
ORAL_TABLET | ORAL | 0 refills | Status: DC
  Filled 2021-12-15: qty 24, 6d supply, fill #0

## 2021-12-15 MED ORDER — METOPROLOL TARTRATE 100 MG PO TABS
100.0000 mg | ORAL_TABLET | Freq: Every day | ORAL | 1 refills | Status: DC
  Filled 2021-12-15: qty 90, 90d supply, fill #0

## 2021-12-19 ENCOUNTER — Other Ambulatory Visit: Payer: Self-pay

## 2021-12-21 ENCOUNTER — Other Ambulatory Visit: Payer: Self-pay

## 2022-01-05 ENCOUNTER — Ambulatory Visit: Payer: 59 | Admitting: Obstetrics and Gynecology

## 2022-01-10 ENCOUNTER — Other Ambulatory Visit: Payer: Self-pay

## 2022-01-12 ENCOUNTER — Other Ambulatory Visit: Payer: Self-pay

## 2022-01-12 MED ORDER — HYDROCODONE-ACETAMINOPHEN 5-325 MG PO TABS
ORAL_TABLET | ORAL | 0 refills | Status: DC
  Filled 2022-01-12: qty 24, 6d supply, fill #0

## 2022-01-15 ENCOUNTER — Encounter: Payer: Self-pay | Admitting: *Deleted

## 2022-01-16 ENCOUNTER — Other Ambulatory Visit: Payer: Self-pay

## 2022-01-16 MED ORDER — HYDROCHLOROTHIAZIDE 25 MG PO TABS: ORAL_TABLET | ORAL | 2 refills | Status: DC

## 2022-01-16 MED ORDER — METFORMIN HCL 500 MG PO TABS: ORAL_TABLET | ORAL | 2 refills | Status: DC

## 2022-01-16 MED ORDER — ATORVASTATIN CALCIUM 40 MG PO TABS
ORAL_TABLET | ORAL | 2 refills | Status: DC
  Filled 2022-01-16: qty 90, 90d supply, fill #0

## 2022-01-16 MED ORDER — METOPROLOL TARTRATE 100 MG PO TABS: ORAL_TABLET | ORAL | 2 refills | Status: DC

## 2022-01-16 MED ORDER — LOSARTAN POTASSIUM 100 MG PO TABS
100.0000 mg | ORAL_TABLET | Freq: Every day | ORAL | 2 refills | Status: DC
  Filled 2022-01-16 – 2022-05-02 (×2): qty 90, 90d supply, fill #0

## 2022-01-16 MED ORDER — CLOPIDOGREL BISULFATE 75 MG PO TABS
ORAL_TABLET | ORAL | 2 refills | Status: DC
  Filled 2022-01-16: qty 90, 90d supply, fill #0

## 2022-01-18 NOTE — Progress Notes (Deleted)
Cardiology Office Note:   Date:  01/18/2022  NAME:  Marie [name]    MRN: [id number] DOB:  04/18/1965   PCP:  Gaynelle Arabian, MD  Cardiologist:  Ida Rogue, MD  Electrophysiologist:  None   Referring MD: Gaynelle Arabian, MD   No chief complaint on file. ***  History of Present Illness:   Marie [name] is a 56 y.o. female with a hx of CAD, HTN, DM, HLD, etoh who presents for follow-up. Seen in July for CP/SOB. NM Stress normal and echo normal.   Problem List CAD s/p CABG -CABG x 3 09/1998 @ [facility]  -PCI LCX 07/2009 -NM Stress 11/06/2021 -> Normal 2. HTN 3. DM -A1c 6.3 4. HLD -T chol 173, HDL 72, LDL 86, TG 83 5. Pulmonary sarcoidosis  6. Etoh  7. Chronic pancreatitis  8. COPD -moderate COPD -severe diffusion defect  9. Pulmonary hypertension   Past Medical History: Past Medical History:  Diagnosis Date   (HFpEF) heart failure with preserved ejection fraction ([place])    a. 10/2018 EF >65% by SPECT.   Chronic pancreatitis (HCC)    Coronary atherosclerosis of unspecified type of vessel, native or graft    a. 09/2018 s/p CABG x 3; b. 07/2009 s/p PCI-->LCX; 10/2018 MV: Low risk w/ small/mild apical inf/lateral reversible defect - likley artifact. EF >65%.   Family history of pancreatic cancer    8/23 cancer genetic testing letter sent   Headache(784.0)    "weekly" (07/29/2013)   Hyperlipidemia LDL goal <70    Hypertension    Leiomyoma of uterus, unspecified    Loss of weight    Obesity, unspecified    OSA (obstructive sleep apnea)    no CPAP   Pulmonary sarcoidosis ([place])    Pure hypercholesterolemia    Shortness of breath    none since stent 2 yrs ago   Type II diabetes mellitus ([place])     Past Surgical History: Past Surgical History:  Procedure Laterality Date   BREAST CYST ASPIRATION     does not remember which breast   BREAST EXCISIONAL BIOPSY     does not remember which breast   CORONARY ANGIOPLASTY WITH STENT PLACEMENT  09/2009   "1"   CORONARY ARTERY  BYPASS GRAFT  ~ 2000   CABG X3   HYSTEROSCOPY WITH NOVASURE N/A 09/04/2012   Procedure: HYSTEROSCOPY WITH NOVASURE;  Surgeon: Mora Bellman, MD;  Location: [facility];  Service: Gynecology;  Laterality: N/A;  with removal intrauterine device   LEEP N/A 12/25/2018   Procedure: LOOP ELECTROSURGICAL EXCISION PROCEDURE (LEEP);  Surgeon: Gae Dry, MD;  Location: [facility];  Service: Gynecology;  Laterality: N/A;   ROBOTIC ASSISTED LAPAROSCOPIC CHOLECYSTECTOMY-MULTI SITE N/A 01/14/2018   Procedure: ROBOTIC ASSISTED LAPAROSCOPIC CHOLECYSTECTOMY-MULTI SITE;  Surgeon: Jules Husbands, MD;  Location: [facility];  Service: General;  Laterality: N/A;   TUBAL LIGATION  1997    Current Medications: No outpatient medications have been marked as taking for the 01/19/22 encounter (Appointment) with O'Neal, Cassie Freer, MD.     Allergies:    Penicillins and Lisinopril   Social History: Social History   Socioeconomic History   Marital status: Married    Spouse name: Not on file   Number of children: Not on file   Years of education: Not on file   Highest education level: Not on file  Occupational History   Occupation: Optometrist   Occupation: Private nursing aid  Tobacco Use   Smoking status: Every Day  Packs/day: 0.50    Years: 32.00    Total pack years: 16.00    Types: Cigarettes   Smokeless tobacco: Never  Vaping Use   Vaping Use: Never used  Substance and Sexual Activity   Alcohol use: Yes    Alcohol/week: 24.0 standard drinks of alcohol    Types: 24 Glasses of wine per week    Comment: 08/28/2016 "3-4 glasses of wine qd; last glass was last night",   Drug use: No   Sexual activity: Not Currently    Birth control/protection: Surgical  Other Topics Concern   Not on file  Social History Narrative   Not on file   Social Determinants of Health   Financial Resource Strain: Not on file  Food Insecurity: Not on file  Transportation Needs: Not on file  Physical Activity: Not on  file  Stress: Not on file  Social Connections: Not on file     Family History: The patient's ***family history includes Colon cancer in her maternal grandfather; Coronary artery disease in her mother; Diabetes in her mother; Heart disease in her maternal uncle, mother, and paternal aunt; Hyperlipidemia in her mother; Liver disease in her maternal uncle; Pancreatic cancer in her mother; Stroke in her maternal grandmother; Sudden death in her mother. There is no history of Breast cancer.  ROS:   All other ROS reviewed and negative. Pertinent positives noted in the HPI.     EKGs/Labs/Other Studies Reviewed:   The following studies were personally reviewed by me today:  EKG:  EKG is *** ordered today.  The ekg ordered today demonstrates ***, and was personally reviewed by me.   TTE 10/31/2021  1. Left ventricular ejection fraction, by estimation, is 60 to 65%. The  left ventricle has normal function. The left ventricle has no regional  wall motion abnormalities. There is mild left ventricular hypertrophy of  the basal-septal segment. Left  ventricular diastolic parameters were normal.   2. Right ventricular systolic function is normal. The right ventricular  size is normal. There is mildly elevated pulmonary artery systolic  pressure. The estimated right ventricular systolic pressure is 76.2 mmHg.   3. The mitral valve is normal in structure. No evidence of mitral valve  regurgitation. No evidence of mitral stenosis.   4. Tricuspid valve regurgitation is moderate.   5. The aortic valve is normal in structure. Aortic valve regurgitation is  not visualized. No aortic stenosis is present.   6. The inferior vena cava is normal in size with greater than 50%  respiratory variability, suggesting right atrial pressure of 3 mmHg.   NM Stress 11/06/2021   Nuclear stress EF: 68 %. The left ventricular ejection fraction is hyperdynamic (>65%).   Normal sinus rhythm with no electrocardiographic  changes.   There is normal radiotracer uptake at both rest and stress.  No ischemia, no infarction.   Low risk nuclear stress test.  Recent Labs: 07/20/2021: ALT 59; Magnesium 1.6 07/21/2021: BUN 11; Creatinine, Ser 0.90; Hemoglobin 13.2; Platelets 153; Potassium 3.8; Sodium 138 10/13/2021: BNP 75.4   Recent Lipid Panel    Component Value Date/Time   CHOL 210 (H) 03/22/2011 1449   TRIG 99 08/13/2012 0415   HDL 52 03/22/2011 1449   CHOLHDL 4.0 03/22/2011 1449   VLDL 23 03/22/2011 1449   LDLCALC 135 (H) 03/22/2011 1449    Physical Exam:   VS:  There were no vitals taken for this visit.   Wt Readings from Last 3 Encounters:  10/31/21 109 lb (49.4  kg)  10/16/21 109 lb 9.6 oz (49.7 kg)  10/13/21 111 lb 3.2 oz (50.4 kg)    General: Well nourished, well developed, in no acute distress Head: Atraumatic, normal size  Eyes: PEERLA, EOMI  Neck: Supple, no JVD Endocrine: No thryomegaly Cardiac: Normal S1, S2; RRR; no murmurs, rubs, or gallops Lungs: Clear to auscultation bilaterally, no wheezing, rhonchi or rales  Abd: Soft, nontender, no hepatomegaly  Ext: No edema, pulses 2+ Musculoskeletal: No deformities, BUE and BLE strength normal and equal Skin: Warm and dry, no rashes   Neuro: Alert and oriented to person, place, time, and situation, CNII-XII grossly intact, no focal deficits  Psych: Normal mood and affect   ASSESSMENT:   DERYA DETTMANN is a 56 y.o. female who presents for the following: No diagnosis found.  PLAN:   There are no diagnoses linked to this encounter.  {Are you ordering a CV Procedure (e.g. stress test, cath, DCCV, TEE, etc)?   Press F2        :[id number]}  Disposition: No follow-ups on file.  Medication Adjustments/Labs and Tests Ordered: Current medicines are reviewed at length with the patient today.  Concerns regarding medicines are outlined above.  No orders of the defined types were placed in this encounter.  No orders of the defined types were  placed in this encounter.   There are no Patient Instructions on file for this visit.   Time Spent with Patient: I have spent a total of *** minutes with patient reviewing hospital notes, telemetry, EKGs, labs and examining the patient as well as establishing an assessment and plan that was discussed with the patient.  > 50% of time was spent in direct patient care.  Signed, Addison Naegeli. Audie Box, MD, [place]  [address] [place], [place] [address] [phone]  01/18/2022 8:50 PM

## 2022-01-19 ENCOUNTER — Ambulatory Visit: Payer: Commercial Managed Care - HMO | Attending: Cardiovascular Disease | Admitting: Cardiovascular Disease

## 2022-01-19 DIAGNOSIS — E782 Mixed hyperlipidemia: Secondary | ICD-10-CM

## 2022-01-19 DIAGNOSIS — I272 Pulmonary hypertension, unspecified: Secondary | ICD-10-CM

## 2022-01-19 DIAGNOSIS — I10 Essential (primary) hypertension: Secondary | ICD-10-CM

## 2022-01-19 DIAGNOSIS — I25810 Atherosclerosis of coronary artery bypass graft(s) without angina pectoris: Secondary | ICD-10-CM

## 2022-02-09 ENCOUNTER — Other Ambulatory Visit: Payer: Self-pay

## 2022-02-09 MED ORDER — HYDROCODONE-ACETAMINOPHEN 5-325 MG PO TABS
1.0000 | ORAL_TABLET | Freq: Four times a day (QID) | ORAL | 0 refills | Status: DC | PRN
  Filled 2022-02-09: qty 24, 6d supply, fill #0

## 2022-02-18 ENCOUNTER — Emergency Department: Payer: Commercial Managed Care - HMO

## 2022-02-18 ENCOUNTER — Other Ambulatory Visit: Payer: Self-pay

## 2022-02-18 ENCOUNTER — Inpatient Hospital Stay
Admission: EM | Admit: 2022-02-18 | Discharge: 2022-02-21 | DRG: 177 | Disposition: A | Discharge status: Home / Self Care | Payer: Commercial Managed Care - HMO | Attending: Internal Medicine | Admitting: Internal Medicine

## 2022-02-18 DIAGNOSIS — Z955 Presence of coronary angioplasty implant and graft: Secondary | ICD-10-CM

## 2022-02-18 DIAGNOSIS — T380X5A Adverse effect of glucocorticoids and synthetic analogues, initial encounter: Secondary | ICD-10-CM | POA: Diagnosis present

## 2022-02-18 DIAGNOSIS — Z79899 Other long term (current) drug therapy: Secondary | ICD-10-CM

## 2022-02-18 DIAGNOSIS — Z7902 Long term (current) use of antithrombotics/antiplatelets: Secondary | ICD-10-CM

## 2022-02-18 DIAGNOSIS — D860 Sarcoidosis of lung: Secondary | ICD-10-CM | POA: Diagnosis present

## 2022-02-18 DIAGNOSIS — I2510 Atherosclerotic heart disease of native coronary artery without angina pectoris: Secondary | ICD-10-CM | POA: Diagnosis present

## 2022-02-18 DIAGNOSIS — E7800 Pure hypercholesterolemia, unspecified: Secondary | ICD-10-CM | POA: Diagnosis present

## 2022-02-18 DIAGNOSIS — Z880 Allergy status to penicillin: Secondary | ICD-10-CM | POA: Diagnosis not present

## 2022-02-18 DIAGNOSIS — F1010 Alcohol abuse, uncomplicated: Secondary | ICD-10-CM | POA: Diagnosis present

## 2022-02-18 DIAGNOSIS — R197 Diarrhea, unspecified: Secondary | ICD-10-CM | POA: Diagnosis present

## 2022-02-18 DIAGNOSIS — K861 Other chronic pancreatitis: Secondary | ICD-10-CM | POA: Diagnosis present

## 2022-02-18 DIAGNOSIS — Z800 Family history of malignant neoplasm of digestive organs: Secondary | ICD-10-CM

## 2022-02-18 DIAGNOSIS — Z888 Allergy status to other drugs, medicaments and biological substances status: Secondary | ICD-10-CM

## 2022-02-18 DIAGNOSIS — K921 Melena: Secondary | ICD-10-CM | POA: Diagnosis present

## 2022-02-18 DIAGNOSIS — J1282 Pneumonia due to coronavirus disease 2019: Secondary | ICD-10-CM | POA: Diagnosis present

## 2022-02-18 DIAGNOSIS — I110 Hypertensive heart disease with heart failure: Secondary | ICD-10-CM | POA: Diagnosis present

## 2022-02-18 DIAGNOSIS — G4733 Obstructive sleep apnea (adult) (pediatric): Secondary | ICD-10-CM | POA: Diagnosis present

## 2022-02-18 DIAGNOSIS — I5033 Acute on chronic diastolic (congestive) heart failure: Secondary | ICD-10-CM | POA: Diagnosis present

## 2022-02-18 DIAGNOSIS — Z789 Other specified health status: Secondary | ICD-10-CM

## 2022-02-18 DIAGNOSIS — J410 Simple chronic bronchitis: Secondary | ICD-10-CM | POA: Diagnosis not present

## 2022-02-18 DIAGNOSIS — F1020 Alcohol dependence, uncomplicated: Secondary | ICD-10-CM | POA: Diagnosis present

## 2022-02-18 DIAGNOSIS — R0902 Hypoxemia: Secondary | ICD-10-CM | POA: Diagnosis present

## 2022-02-18 DIAGNOSIS — K219 Gastro-esophageal reflux disease without esophagitis: Secondary | ICD-10-CM | POA: Diagnosis present

## 2022-02-18 DIAGNOSIS — F17210 Nicotine dependence, cigarettes, uncomplicated: Secondary | ICD-10-CM | POA: Diagnosis present

## 2022-02-18 DIAGNOSIS — J440 Chronic obstructive pulmonary disease with acute lower respiratory infection: Secondary | ICD-10-CM | POA: Diagnosis present

## 2022-02-18 DIAGNOSIS — Z7984 Long term (current) use of oral hypoglycemic drugs: Secondary | ICD-10-CM

## 2022-02-18 DIAGNOSIS — I10 Essential (primary) hypertension: Secondary | ICD-10-CM | POA: Diagnosis present

## 2022-02-18 DIAGNOSIS — E118 Type 2 diabetes mellitus with unspecified complications: Secondary | ICD-10-CM | POA: Diagnosis present

## 2022-02-18 DIAGNOSIS — K922 Gastrointestinal hemorrhage, unspecified: Secondary | ICD-10-CM

## 2022-02-18 DIAGNOSIS — K625 Hemorrhage of anus and rectum: Secondary | ICD-10-CM | POA: Diagnosis present

## 2022-02-18 DIAGNOSIS — Z83438 Family history of other disorder of lipoprotein metabolism and other lipidemia: Secondary | ICD-10-CM

## 2022-02-18 DIAGNOSIS — J449 Chronic obstructive pulmonary disease, unspecified: Secondary | ICD-10-CM | POA: Diagnosis present

## 2022-02-18 DIAGNOSIS — U071 COVID-19: Secondary | ICD-10-CM | POA: Diagnosis present

## 2022-02-18 DIAGNOSIS — E1165 Type 2 diabetes mellitus with hyperglycemia: Secondary | ICD-10-CM | POA: Diagnosis present

## 2022-02-18 DIAGNOSIS — Z8249 Family history of ischemic heart disease and other diseases of the circulatory system: Secondary | ICD-10-CM

## 2022-02-18 DIAGNOSIS — Z720 Tobacco use: Secondary | ICD-10-CM | POA: Diagnosis present

## 2022-02-18 DIAGNOSIS — Z951 Presence of aortocoronary bypass graft: Secondary | ICD-10-CM | POA: Diagnosis not present

## 2022-02-18 DIAGNOSIS — J9601 Acute respiratory failure with hypoxia: Secondary | ICD-10-CM | POA: Diagnosis present

## 2022-02-18 DIAGNOSIS — Z823 Family history of stroke: Secondary | ICD-10-CM

## 2022-02-18 DIAGNOSIS — F1090 Alcohol use, unspecified, uncomplicated: Secondary | ICD-10-CM

## 2022-02-18 DIAGNOSIS — E669 Obesity, unspecified: Secondary | ICD-10-CM | POA: Diagnosis present

## 2022-02-18 DIAGNOSIS — K2970 Gastritis, unspecified, without bleeding: Secondary | ICD-10-CM | POA: Diagnosis present

## 2022-02-18 DIAGNOSIS — K2960 Other gastritis without bleeding: Secondary | ICD-10-CM | POA: Diagnosis not present

## 2022-02-18 DIAGNOSIS — Z833 Family history of diabetes mellitus: Secondary | ICD-10-CM

## 2022-02-18 LAB — COMPREHENSIVE METABOLIC PANEL
ALT: 35 U/L (ref 0–44)
AST: 88 U/L — ABNORMAL HIGH (ref 15–41)
Albumin: 3.8 g/dL (ref 3.5–5.0)
Alkaline Phosphatase: 82 U/L (ref 38–126)
Anion gap: 8 (ref 5–15)
BUN: 7 mg/dL (ref 6–20)
CO2: 27 mmol/L (ref 22–32)
Calcium: 9.5 mg/dL (ref 8.9–10.3)
Chloride: 108 mmol/L (ref 98–111)
Creatinine, Ser: 0.72 mg/dL (ref 0.44–1.00)
GFR, Estimated: >60 mL/min (ref >60)
Glucose, Bld: 197 mg/dL — ABNORMAL HIGH (ref 70–99)
Potassium: 3.8 mmol/L (ref 3.5–5.1)
Sodium: 143 mmol/L (ref 135–145)
Total Bilirubin: 0.8 mg/dL (ref 0.3–1.2)
Total Protein: 7.7 g/dL (ref 6.5–8.1)

## 2022-02-18 LAB — CBC WITH DIFFERENTIAL/PLATELET
Abs Immature Granulocytes: 0.02 10*3/uL (ref 0.00–0.07)
Basophils Absolute: 0 10*3/uL (ref 0.0–0.1)
Basophils Relative: 0 %
Eosinophils Absolute: 0 10*3/uL (ref 0.0–0.5)
Eosinophils Relative: 0 %
HCT: 45 % (ref 36.0–46.0)
Hemoglobin: 14.4 g/dL (ref 12.0–15.0)
Immature Granulocytes: 0 %
Lymphocytes Relative: 17 %
Lymphs Abs: 0.8 10*3/uL (ref 0.7–4.0)
MCH: 31 pg (ref 26.0–34.0)
MCHC: 32 g/dL (ref 30.0–36.0)
MCV: 97 fL (ref 80.0–100.0)
Monocytes Absolute: 0.3 10*3/uL (ref 0.1–1.0)
Monocytes Relative: 7 %
Neutro Abs: 3.6 10*3/uL (ref 1.7–7.7)
Neutrophils Relative %: 76 %
Platelets: 252 10*3/uL (ref 150–400)
RBC: 4.64 MIL/uL (ref 3.87–5.11)
RDW: 12.1 % (ref 11.5–15.5)
WBC: 4.8 10*3/uL (ref 4.0–10.5)
nRBC: 0 % (ref 0.0–0.2)

## 2022-02-18 LAB — URINALYSIS, ROUTINE W REFLEX MICROSCOPIC
Bilirubin Urine: NEGATIVE
Glucose, UA: NEGATIVE mg/dL
Hgb urine dipstick: NEGATIVE
Ketones, ur: NEGATIVE mg/dL
Leukocytes,Ua: NEGATIVE
Nitrite: NEGATIVE
Protein, ur: NEGATIVE mg/dL
Specific Gravity, Urine: 1.018 (ref 1.005–1.030)
pH: 5 (ref 5.0–8.0)

## 2022-02-18 LAB — SAMPLE TO BLOOD BANK

## 2022-02-18 LAB — LIPASE, BLOOD: Lipase: 25 U/L (ref 11–51)

## 2022-02-18 LAB — LACTIC ACID, PLASMA: Lactic Acid, Venous: 1.7 mmol/L (ref 0.5–1.9)

## 2022-02-18 LAB — RESP PANEL BY RT-PCR (FLU A&B, COVID) ARPGX2
Influenza A by PCR: NEGATIVE
Influenza B by PCR: NEGATIVE
SARS Coronavirus 2 by RT PCR: POSITIVE — AB

## 2022-02-18 LAB — BRAIN NATRIURETIC PEPTIDE: B Natriuretic Peptide: 183.2 pg/mL — ABNORMAL HIGH (ref 0.0–100.0)

## 2022-02-18 MED ORDER — IOHEXOL 350 MG/ML SOLN
75.0000 mL | Freq: Once | INTRAVENOUS | Status: AC | PRN
  Administered 2022-02-18: 75 mL via INTRAVENOUS

## 2022-02-18 MED ORDER — SODIUM CHLORIDE 0.9 % IV SOLN
200.0000 mg | Freq: Once | INTRAVENOUS | Status: AC
  Administered 2022-02-18: 200 mg via INTRAVENOUS
  Filled 2022-02-18: qty 40

## 2022-02-18 MED ORDER — ACETAMINOPHEN 325 MG PO TABS: 650.0000 mg | ORAL_TABLET | Freq: Four times a day (QID) | ORAL | Status: DC | PRN

## 2022-02-18 MED ORDER — ZINC SULFATE 220 (50 ZN) MG PO CAPS
220.0000 mg | ORAL_CAPSULE | Freq: Every day | ORAL | Status: DC
  Administered 2022-02-18 – 2022-02-21 (×4): 220 mg via ORAL
  Filled 2022-02-18 (×4): qty 1

## 2022-02-18 MED ORDER — PANTOPRAZOLE SODIUM 40 MG IV SOLR
40.0000 mg | INTRAVENOUS | Status: DC
  Administered 2022-02-18 – 2022-02-20 (×3): 40 mg via INTRAVENOUS
  Filled 2022-02-18 (×3): qty 10

## 2022-02-18 MED ORDER — DEXAMETHASONE SODIUM PHOSPHATE 10 MG/ML IJ SOLN
6.0000 mg | INTRAMUSCULAR | Status: DC
  Administered 2022-02-18: 6 mg via INTRAVENOUS
  Filled 2022-02-18: qty 1

## 2022-02-18 MED ORDER — SODIUM CHLORIDE 0.9 % IV BOLUS
500.0000 mL | Freq: Once | INTRAVENOUS | Status: AC
  Administered 2022-02-18: 500 mL via INTRAVENOUS

## 2022-02-18 MED ORDER — INSULIN ASPART 100 UNIT/ML IJ SOLN
0.0000 [IU] | Freq: Three times a day (TID) | INTRAMUSCULAR | Status: DC
  Administered 2022-02-19: 2 [IU] via SUBCUTANEOUS
  Administered 2022-02-19: 3 [IU] via SUBCUTANEOUS
  Administered 2022-02-19: 8 [IU] via SUBCUTANEOUS
  Administered 2022-02-20: 2 [IU] via SUBCUTANEOUS
  Administered 2022-02-20: 3 [IU] via SUBCUTANEOUS
  Administered 2022-02-21: 11 [IU] via SUBCUTANEOUS
  Administered 2022-02-21: 8 [IU] via SUBCUTANEOUS
  Filled 2022-02-18 (×7): qty 1

## 2022-02-18 MED ORDER — ACETAMINOPHEN 650 MG RE SUPP: 650.0000 mg | Freq: Four times a day (QID) | RECTAL | Status: DC | PRN

## 2022-02-18 MED ORDER — ADULT MULTIVITAMIN W/MINERALS CH
1.0000 | ORAL_TABLET | Freq: Every day | ORAL | Status: DC
  Administered 2022-02-18 – 2022-02-21 (×4): 1 via ORAL
  Filled 2022-02-18 (×4): qty 1

## 2022-02-18 MED ORDER — LORAZEPAM 2 MG PO TABS
0.0000 mg | ORAL_TABLET | Freq: Four times a day (QID) | ORAL | Status: DC
  Administered 2022-02-18: 2 mg via ORAL
  Filled 2022-02-18: qty 1

## 2022-02-18 MED ORDER — THIAMINE HCL 100 MG/ML IJ SOLN
100.0000 mg | Freq: Every day | INTRAMUSCULAR | Status: DC
  Administered 2022-02-18: 100 mg via INTRAVENOUS
  Filled 2022-02-18 (×2): qty 2

## 2022-02-18 MED ORDER — METHYLPREDNISOLONE SODIUM SUCC 125 MG IJ SOLR
125.0000 mg | Freq: Once | INTRAMUSCULAR | Status: AC
  Administered 2022-02-18: 125 mg via INTRAVENOUS
  Filled 2022-02-18: qty 2

## 2022-02-18 MED ORDER — THIAMINE MONONITRATE 100 MG PO TABS
100.0000 mg | ORAL_TABLET | Freq: Every day | ORAL | Status: DC
  Administered 2022-02-19 – 2022-02-21 (×3): 100 mg via ORAL
  Filled 2022-02-18 (×4): qty 1

## 2022-02-18 MED ORDER — LORAZEPAM 2 MG PO TABS
0.0000 mg | ORAL_TABLET | Freq: Two times a day (BID) | ORAL | Status: DC
  Administered 2022-02-21: 2 mg via ORAL
  Filled 2022-02-18: qty 2

## 2022-02-18 MED ORDER — LOSARTAN POTASSIUM 50 MG PO TABS
100.0000 mg | ORAL_TABLET | Freq: Every day | ORAL | Status: DC
  Administered 2022-02-18: 100 mg via ORAL
  Filled 2022-02-18: qty 2

## 2022-02-18 MED ORDER — METOPROLOL TARTRATE 50 MG PO TABS
100.0000 mg | ORAL_TABLET | Freq: Two times a day (BID) | ORAL | Status: DC
  Administered 2022-02-18: 100 mg via ORAL
  Filled 2022-02-18 (×2): qty 2

## 2022-02-18 MED ORDER — SODIUM CHLORIDE 0.9 % IV SOLN
100.0000 mg | Freq: Every day | INTRAVENOUS | Status: DC
  Filled 2022-02-18: qty 20

## 2022-02-18 MED ORDER — ACETAMINOPHEN 500 MG PO TABS
1000.0000 mg | ORAL_TABLET | Freq: Once | ORAL | Status: AC
  Administered 2022-02-18: 1000 mg via ORAL
  Filled 2022-02-18: qty 2

## 2022-02-18 MED ORDER — MORPHINE SULFATE (PF) 2 MG/ML IV SOLN
2.0000 mg | INTRAVENOUS | Status: DC | PRN
  Administered 2022-02-18 – 2022-02-19 (×3): 2 mg via INTRAVENOUS
  Filled 2022-02-18 (×3): qty 1

## 2022-02-18 MED ORDER — FUROSEMIDE 10 MG/ML IJ SOLN
20.0000 mg | Freq: Every day | INTRAMUSCULAR | Status: DC
  Administered 2022-02-18: 20 mg via INTRAVENOUS
  Filled 2022-02-18: qty 4

## 2022-02-18 MED ORDER — GUAIFENESIN-DM 100-10 MG/5ML PO SYRP: 10.0000 mL | ORAL_SOLUTION | ORAL | Status: DC | PRN

## 2022-02-18 MED ORDER — PANTOPRAZOLE SODIUM 40 MG IV SOLR
40.0000 mg | Freq: Once | INTRAVENOUS | Status: AC
  Administered 2022-02-18: 40 mg via INTRAVENOUS
  Filled 2022-02-18: qty 10

## 2022-02-18 MED ORDER — FOLIC ACID 1 MG PO TABS
1.0000 mg | ORAL_TABLET | Freq: Every day | ORAL | Status: DC
  Administered 2022-02-18 – 2022-02-21 (×4): 1 mg via ORAL
  Filled 2022-02-18 (×4): qty 1

## 2022-02-18 MED ORDER — NICOTINE 14 MG/24HR TD PT24
14.0000 mg | MEDICATED_PATCH | Freq: Every day | TRANSDERMAL | Status: DC
  Administered 2022-02-18 – 2022-02-21 (×4): 14 mg via TRANSDERMAL
  Filled 2022-02-18 (×4): qty 1

## 2022-02-18 MED ORDER — LORAZEPAM 2 MG/ML IJ SOLN: 1.0000 mg | INTRAMUSCULAR | Status: DC | PRN

## 2022-02-18 MED ORDER — VITAMIN C 500 MG PO TABS
500.0000 mg | ORAL_TABLET | Freq: Every day | ORAL | Status: DC
  Administered 2022-02-18 – 2022-02-21 (×4): 500 mg via ORAL
  Filled 2022-02-18 (×4): qty 1

## 2022-02-18 MED ORDER — ALBUTEROL SULFATE HFA 108 (90 BASE) MCG/ACT IN AERS
2.0000 | INHALATION_SPRAY | Freq: Four times a day (QID) | RESPIRATORY_TRACT | Status: DC
  Administered 2022-02-18 – 2022-02-21 (×11): 2 via RESPIRATORY_TRACT
  Filled 2022-02-18: qty 6.7

## 2022-02-18 MED ORDER — ATORVASTATIN CALCIUM 20 MG PO TABS
40.0000 mg | ORAL_TABLET | Freq: Every day | ORAL | Status: DC
  Administered 2022-02-18 – 2022-02-21 (×4): 40 mg via ORAL
  Filled 2022-02-18 (×4): qty 2

## 2022-02-18 MED ORDER — LORAZEPAM 1 MG PO TABS: 1.0000 mg | ORAL_TABLET | ORAL | Status: DC | PRN

## 2022-02-18 NOTE — ED Provider Notes (Signed)
[facility] Provider Note    Event Date/Time   First MD Initiated Contact with Patient 02/18/22 1529     (approximate)   History   Blood In Stools   HPI  Marie [name] is a 56 y.o. female with past medical history significant for COPD, tobacco use, alcohol use, history of pancreatitis, presents to the emergency department with blood in the stool.  Patient endorses abdominal pain that is an upper abdominal pain with nausea and blood in her stool that has been ongoing since Thursday.  Last drink of alcohol was Thursday.  States that she drinks approximately 1 bottle of wine on a regular basis.  No history of cirrhosis.  No prior endoscopy.  No prior history of GI bleed in the past.  Not on anticoagulation.  No Goody's powder or NSAID use.  Denies history of withdrawal from alcohol.  Also endorses a productive cough.  States that her oxygen was in the high 80s when she checked in and placed on 2 L of oxygen.  No history of needing oxygen in the past.  No recent antibiotic use or hospitalizations.  No history of DVT or PE.  No chest pain at this time.  No fever or chills.     Physical Exam   Triage Vital Signs: ED Triage Vitals  Enc Vitals Group     BP 02/18/22 1250 (!) 154/103     Pulse Rate 02/18/22 1250 (!) 102     Resp 02/18/22 1250 18     Temp 02/18/22 1250 98.8 F (37.1 C)     Temp Source 02/18/22 1250 Oral     SpO2 02/18/22 1250 (!) 86 %     Weight 02/18/22 1253 112 lb (50.8 kg)     Height 02/18/22 1253 '5\' 2"'$  (1.575 m)     Head Circumference --      Peak Flow --      Pain Score 02/18/22 1259 10     Pain Loc --      Pain Edu? --      Excl. in [place]? --     Most recent vital signs: Vitals:   02/18/22 1600 02/18/22 1737  BP: (!) 174/87   Pulse: 72   Resp: 18   Temp:  97.7 F (36.5 C)  SpO2: 96%     Physical Exam Constitutional:      Appearance: She is well-developed.  HENT:     Head: Atraumatic.  Eyes:     Conjunctiva/sclera:  Conjunctivae normal.  Cardiovascular:     Rate and Rhythm: Tachycardia present.  Pulmonary:     Effort: No respiratory distress.     Comments: 86% on room air, placed on 2 L nasal cannula with improvement to 94%.  Crackles to lower lung fields L> R Abdominal:     General: There is no distension.     Tenderness: There is abdominal tenderness (Epigastric).  Genitourinary:    Comments: DRE with no melena or gross blood, no stool in the rectal vault Musculoskeletal:        General: Normal range of motion.     Cervical back: Normal range of motion.     Right lower leg: No edema.     Left lower leg: No edema.  Skin:    General: Skin is warm.     Capillary Refill: Capillary refill takes less than 2 seconds.  Neurological:     Mental Status: She is alert. Mental status is at baseline.  Psychiatric:        Mood and Affect: Mood normal.          IMPRESSION / MDM / [place] / ED COURSE  I reviewed the triage vital signs and the nursing notes.  56 year old female presents to the emergency department with abdominal pain, GI bleed and shortness of breath.  On arrival to the emergency department found to be hypoxic with an 86% on room air, placed on 2 L nasal cannula.  Tachycardic on arrival but normotensive and afebrile.  Differential diagnosis and concerning for upper GI bleed, lower GI bleed, peptic ulcer disease, gastritis, diverticulitis, varices  Patient was typed and screened.   RADIOLOGY I independently reviewed imaging, my interpretation of imaging: Chest x-ray, CT scan abdomen and pelvis with contrast chest x-ray with interstitial edema versus atypical pneumonia.  On CT scan interstitial inflammation versus edema.  Findings concerning for possible colitis.      ED Results / Procedures / Treatments   Labs (all labs ordered are listed, but only abnormal results are displayed) Labs interpreted as -   Creatinine at baseline.  No significant electrolyte  abnormalities.  Normal BUN.  Hemoglobin stable at 14.4.  COVID testing added on.  Labs Reviewed  RESP PANEL BY RT-PCR (FLU A&B, COVID) ARPGX2 - Abnormal; Notable for the following components:      Result Value   SARS Coronavirus 2 by RT PCR POSITIVE (*)    All other components within normal limits  COMPREHENSIVE METABOLIC PANEL - Abnormal; Notable for the following components:   Glucose, Bld 197 (*)    AST 88 (*)    All other components within normal limits  URINALYSIS, ROUTINE W REFLEX MICROSCOPIC - Abnormal; Notable for the following components:   Color, Urine YELLOW (*)    APPearance HAZY (*)    All other components within normal limits  BRAIN NATRIURETIC PEPTIDE - Abnormal; Notable for the following components:   B Natriuretic Peptide 183.2 (*)    All other components within normal limits  CBC WITH DIFFERENTIAL/PLATELET  LIPASE, BLOOD  SAMPLE TO BLOOD BANK    Treatment with IV Protonix.   COVID test came back positive.  Patient admitted for acute hypoxic respiratory failure in the setting of COVID-pneumonia with symptoms of GI bleed.   PROCEDURES:  Critical Care performed: No  Procedures  Patient's presentation is most consistent with acute presentation with potential threat to life or bodily function.   MEDICATIONS ORDERED IN ED: Medications  sodium chloride 0.9 % bolus 500 mL (0 mLs Intravenous Stopped 02/18/22 1734)  pantoprazole (PROTONIX) injection 40 mg (40 mg Intravenous Given 02/18/22 1558)  iohexol (OMNIPAQUE) 350 MG/ML injection 75 mL (75 mLs Intravenous Contrast Given 02/18/22 1616)  methylPREDNISolone sodium succinate (SOLU-MEDROL) 125 mg/2 mL injection 125 mg (125 mg Intravenous Given 02/18/22 1733)  acetaminophen (TYLENOL) tablet 1,000 mg (1,000 mg Oral Given 02/18/22 1732)    FINAL CLINICAL IMPRESSION(S) / ED DIAGNOSES   Final diagnoses:  Hypoxic  Gastrointestinal hemorrhage, unspecified gastrointestinal hemorrhage type  Pneumonia due to  COVID-19 virus  Alcohol use     Rx / DC Orders   ED Discharge Orders     None        Note:  This document was prepared using Dragon voice recognition software and may include unintentional dictation errors.   Nathaniel Man, MD 02/18/22 1746

## 2022-02-18 NOTE — Assessment & Plan Note (Addendum)
Patient presents for evaluation of a cough productive of clear phlegm associated with worsening shortness of breath and was found to be hypoxic COVID-19 PCR is positive Imaging shows bilateral groundglass opacity Patient was on 3 L oxygen.  Wean as able to room air Supportive care with bronchodilator therapy, systemic steroids, antitussives and vitamins

## 2022-02-18 NOTE — Assessment & Plan Note (Addendum)
Patient presents for evaluation of bright red blood per rectum and denies having a history of hemorrhoids or diverticular disease.  No further bleed while in the hospital Hemoglobin is stable  Monitor H&H closely and if any significant drop will consult GI

## 2022-02-18 NOTE — Assessment & Plan Note (Addendum)
Holding blood pressure medicine.

## 2022-02-18 NOTE — H&P (Signed)
History and Physical    Patient: Marie [name]:[id number] DOB: 01-20-1966 DOA: 02/18/2022 DOS: the patient was seen and examined on 02/18/2022 PCP: Gaynelle Arabian, MD  Patient coming from: Home  Chief Complaint:  Chief Complaint  Patient presents with   Blood In Stools   HPI: Marie [name] is a 56 y.o. female with medical history significant for COPD, nicotine dependence, chronic pancreatitis, hypertension, diabetes mellitus, alcohol dependence who presents to the ER for evaluation of blood in her stools for 3 days. Patient notes that she has had bright red blood per rectum with bowel movement.  She denies having to strain to have a bowel movement and has not been constipated.  She denies any NSAID use and is not on any anticoagulants.  Denies having history of hemorrhoids. She also complains of abdominal pain mostly in the epigastrium/periumbilical area which she thought was secondary to an acute flareup of her known pancreatitis.  She has had nausea but denies having any emesis.  She denies having any melena or hematemesis.  Abdominal pain is rated a 6 x 10 in intensity at its worst with radiation to the back. She has had a cough productive of clear phlegm for about 3 days and worsening shortness of breath from her baseline mostly with exertion but denies having any fever, no chills, no myalgias, no headache. She denies having any chest pain, no palpitations, no diaphoresis, no dizziness, swelling, no blurred vision, no focal deficit. Her SARS coronavirus 2 PCR is positive CT scan of abdomen and pelvis shows mild wall thickening involving the distal transverse colon, splenic flexure, descending and sigmoid colon suggesting mild colitis of infectious, inflammatory, or ischemic etiology. Few fluid-filled nondilated loops of small bowel, question enteritis.Chronic pancreatitis. No acute inflammation centered about the pancreas. Fibroid uterus. Aortic atherosclerosis. Lower lungs are  noted to have patchy groundglass opacities Chest x-ray reviewed by me shows cardiomegaly with mild interstitial opacities which may represent interstitial edema. She was hypoxic upon arrival to the ER with room air pulse oximetry of 86% and is currently on 2L to maintain pulse oximetry greater than 92%. She received a dose of IV Solu-Medrol and Protonix in the ER and will be admitted to the hospital for further evaluation.  Review of Systems: As mentioned in the history of present illness. All other systems reviewed and are negative. Past Medical History:  Diagnosis Date   (HFpEF) heart failure with preserved ejection fraction ([place])    a. 10/2018 EF >65% by SPECT.   Chronic pancreatitis (HCC)    Coronary atherosclerosis of unspecified type of vessel, native or graft    a. 09/2018 s/p CABG x 3; b. 07/2009 s/p PCI-->LCX; 10/2018 MV: Low risk w/ small/mild apical inf/lateral reversible defect - likley artifact. EF >65%.   Family history of pancreatic cancer    8/23 cancer genetic testing letter sent   Headache(784.0)    "weekly" (07/29/2013)   Hyperlipidemia LDL goal <70    Hypertension    Leiomyoma of uterus, unspecified    Loss of weight    Obesity, unspecified    OSA (obstructive sleep apnea)    no CPAP   Pulmonary sarcoidosis (HCC)    Pure hypercholesterolemia    Shortness of breath    none since stent 2 yrs ago   Type II diabetes mellitus ([place])    Past Surgical History:  Procedure Laterality Date   BREAST CYST ASPIRATION     does not remember which breast   BREAST EXCISIONAL BIOPSY  does not remember which breast   CORONARY ANGIOPLASTY WITH STENT PLACEMENT  09/2009   "1"   CORONARY ARTERY BYPASS GRAFT  ~ 2000   CABG X3   HYSTEROSCOPY WITH NOVASURE N/A 09/04/2012   Procedure: HYSTEROSCOPY WITH NOVASURE;  Surgeon: Mora Bellman, MD;  Location: [facility];  Service: Gynecology;  Laterality: N/A;  with removal intrauterine device   LEEP N/A 12/25/2018   Procedure: LOOP ELECTROSURGICAL  EXCISION PROCEDURE (LEEP);  Surgeon: Gae Dry, MD;  Location: [facility];  Service: Gynecology;  Laterality: N/A;   ROBOTIC ASSISTED LAPAROSCOPIC CHOLECYSTECTOMY-MULTI SITE N/A 01/14/2018   Procedure: ROBOTIC ASSISTED LAPAROSCOPIC CHOLECYSTECTOMY-MULTI SITE;  Surgeon: Jules Husbands, MD;  Location: [facility];  Service: General;  Laterality: N/A;   TUBAL LIGATION  1997   Social History:  reports that she has been smoking cigarettes. She has a 16.00 pack-year smoking history. She has never used smokeless tobacco. She reports current alcohol use of about 24.0 standard drinks of alcohol per week. She reports that she does not use drugs.  Allergies  Allergen Reactions   Penicillins Other (See Comments)    Unknown    Lisinopril Hives    Other reaction(s): hives/angiod    Family History  Problem Relation Age of Onset   Diabetes Mother    Coronary artery disease Mother    Heart disease Mother    Hyperlipidemia Mother    Sudden death Mother    Pancreatic cancer Mother    Stroke Maternal Grandmother    Colon cancer Maternal Grandfather    Liver disease Maternal Uncle    Heart disease Maternal Uncle    Heart disease Paternal Aunt    Breast cancer Neg Hx     Prior to Admission medications   Medication Sig Start Date End Date Taking? Authorizing Provider  atorvastatin (LIPITOR) 40 MG tablet Take 1 tablet (40 mg total) by mouth daily. 10/13/21 10/08/22  Geralynn Rile, MD  atorvastatin (LIPITOR) 40 MG tablet 1 tablet Orally once a day for cholesterol 01/16/22     clopidogrel (PLAVIX) 75 MG tablet 1 tablet Orally Once a day to thin blood 11/30/20     clopidogrel (PLAVIX) 75 MG tablet Take one tablet by mouth daily to thin blood 01/16/22     furosemide (LASIX) 40 MG tablet Take 1 tablet (40 mg total) by mouth daily. 07/22/21   Sharen Hones, MD  hydrochlorothiazide (HYDRODIURIL) 25 MG tablet 1 tablet    [provider]  hydrochlorothiazide (HYDRODIURIL) 25 MG tablet 1 tablet  Orally once a day for blood pressure 01/16/22     HYDROcodone-acetaminophen (NORCO/VICODIN) 5-325 MG tablet Take 1 tablet by mouth every 6 (six) hours as needed. 06/28/21     HYDROcodone-acetaminophen (NORCO/VICODIN) 5-325 MG tablet 1 tablet Orally every 6 hrs as needed for pain 01/12/22     HYDROcodone-acetaminophen (NORCO/VICODIN) 5-325 MG tablet Take 1 tablet by mouth every 6 (six) hours as needed for pain. 02/09/22     losartan (COZAAR) 100 MG tablet 1 tablet Orally Once a day for BP and diabetic kidney protection 01/16/22     metFORMIN (GLUCOPHAGE) 500 MG tablet 1 tablet with breakfast Orally Once a day for diabetes 06/21/21     metFORMIN (GLUCOPHAGE) 500 MG tablet 1 tablet with breakfast Orally Once a day for diabetes 10/17/21     metFORMIN (GLUCOPHAGE) 500 MG tablet Take 1 tablet by mouth daily with breakfast for diabetes 01/16/22     metoprolol tartrate (LOPRESSOR) 100 MG tablet TAKE 1 TABLET BY  MOUTH ONCE DAILY FOR BLOOD PRESSURE AND HEART 06/10/20 06/10/21  Gaynelle Arabian, MD  metoprolol tartrate (LOPRESSOR) 100 MG tablet 1 tablet Orally Once a day for BP and heart 10/17/21     metoprolol tartrate (LOPRESSOR) 100 MG tablet Take 1 tablet (100 mg total) by mouth daily for BP and heart. 12/15/21     metoprolol tartrate (LOPRESSOR) 100 MG tablet Take 1 tablet by mouth daily for blood pressure and heart 01/16/22       Physical Exam: Vitals:   02/18/22 1253 02/18/22 1557 02/18/22 1600 02/18/22 1737  BP:  (!) 152/98 (!) 174/87   Pulse:  72 72   Resp:  16 18   Temp:    97.7 F (36.5 C)  TempSrc:    Axillary  SpO2: 94% 97% 96%   Weight: 50.8 kg     Height: '5\' 2"'$  (1.575 m)      Physical Exam Vitals and nursing note reviewed.  Constitutional:      Comments: Thin  HENT:     Head: Normocephalic and atraumatic.     Nose: Nose normal.     Mouth/Throat:     Mouth: Mucous membranes are moist.  Eyes:     Conjunctiva/sclera: Conjunctivae normal.  Cardiovascular:     Rate and Rhythm: Normal  rate and regular rhythm.  Pulmonary:     Comments: Rales at the bases bilaterally Abdominal:     General: Abdomen is flat. Bowel sounds are normal.     Palpations: Abdomen is soft.     Tenderness: There is abdominal tenderness.     Comments: Tender in the periumbilical and epigastric area  Musculoskeletal:        General: Normal range of motion.     Cervical back: Normal range of motion and neck supple.  Skin:    General: Skin is warm and dry.  Neurological:     General: No focal deficit present.     Mental Status: She is alert and oriented to person, place, and time.  Psychiatric:        Mood and Affect: Mood normal.        Behavior: Behavior normal.     Data Reviewed: Relevant notes from primary care and specialist visits, past discharge summaries as available in EHR, including Care Everywhere. Prior diagnostic testing as pertinent to current admission diagnoses Updated medications and problem lists for reconciliation ED course, including vitals, labs, imaging, treatment and response to treatment Triage notes, nursing and pharmacy notes and ED provider's notes Notable results as noted in HPI Labs reviewed.  BNP 183, sodium 143, potassium 3.8, chloride 108, bicarb 27, glucose 197, BUN 7, creatinine 0.72, calcium 9.5, total protein 7.7, albumin 3.8, AST 88, ALT 35, alkaline phosphatase 82, total bilirubin 0.8, lipase 25, white count 4.8, hemoglobin 14.4, hematocrit 45, platelet count 252 There are no new results to review at this time.  Assessment and Plan: * Acute respiratory failure with hypoxia ([place]) Patient presents for evaluation of worsening shortness of breath from her baseline and is noted to be hypoxic with room air pulse oximetry of 86% She is currently on 2 L of oxygen to maintain pulse oximetry greater than 92% Acute respiratory failure with hypoxia appears to be multifactorial and related to COVID-19 pneumonia, COPD and acute diastolic dysfunction CHF. We will  attempt to wean patient off oxygen once acute illness improves or resolves  Pneumonia due to COVID-19 virus Patient presents for evaluation of a cough productive of clear phlegm associated with  worsening shortness of breath and was found to be hypoxic COVID-19 PCR is positive Imaging shows bilateral groundglass opacity Patient is continue 2 L of oxygen to maintain pulse oximetry greater than 92% We will place patient empirically on remdesivir Supportive care with bronchodilator therapy, systemic steroids, antitussives and vitamins  Gastritis Probably related to alcohol abuse and etiology for patient's epigastric pain Place patient on full liquid diet Supportive care with IV PPI and antiemetics  Rectal bleeding Patient presents for evaluation of bright red blood per rectum and denies having a history of hemorrhoids or diverticular disease Hemoglobin is stable at 14 Monitor H&H closely and if any significant drop will consult GI  Tobacco use Smoking cessation has been discussed with patient in detail We will place patient on a nicotine transdermal patch 14 mg daily  Acute on chronic diastolic CHF (congestive heart failure) (HCC) Last known LVEF of 60 to 65% Place patient on IV Lasix Optimize blood pressure control Continue metoprolol  Essential hypertension Continue metoprolol  Alcohol abuse, continuous Patient has been advised to abstain from further alcohol use Labs show AST 2 x ALT Monitor closely for signs and symptoms of alcohol withdrawal Place patient on CIWA protocol and administer lorazepam for CIWA score of 8 or greater  Chronic pancreatitis ([place]) Patient presents for evaluation of abdominal pain mostly in the epigastrium and periumbilical area similar to her prior episodes of pancreatitis Imaging shows chronic pancreatitis without acute inflammation Pain control  ACID REFLUX DISEASE Place patient on IV Protonix  Diabetes mellitus with complication (HCC) Hold  metformin Glycemic control with sliding scale insulin       Advance Care Planning:   Code Status: Full Code   Consults: None  Family Communication: Greater than 50% of time was spent discussing patient's condition and plan of care with her at the bedside.  All questions and concerns have been addressed.  She verbalizes understanding and agrees with plan.  Severity of Illness: The appropriate patient status for this patient is INPATIENT. Inpatient status is judged to be reasonable and necessary in order to provide the required intensity of service to ensure the patient's safety. The patient's presenting symptoms, physical exam findings, and initial radiographic and laboratory data in the context of their chronic comorbidities is felt to place them at high risk for further clinical deterioration. Furthermore, it is not anticipated that the patient will be medically stable for discharge from the hospital within 2 midnights of admission.   * I certify that at the point of admission it is my clinical judgment that the patient will require inpatient hospital care spanning beyond 2 midnights from the point of admission due to high intensity of service, high risk for further deterioration and high frequency of surveillance required.*  Author: Collier Bullock, MD 02/18/2022 6:06 PM  For on call review [email or web address].

## 2022-02-18 NOTE — ED Provider Triage Note (Signed)
  Emergency Medicine Provider Triage Evaluation Note  Marie [name] , a 56 y.o.female,  was evaluated in triage.  Pt complains of abdominal pain, blood in stool.  Patient states that her symptoms began a few days ago.  She states that she is having significant 10/10 lower abdominal pain bilaterally.  She states that the blood in her stool is a moderate amount has been going on for the past few days.  She states that this is not happened to her before.  She does state that she has had issues with her pancreas in the past.   Review of Systems  Positive: Abdominal pain, rectal bleeding, Negative: Denies fever, chest pain, vomiting  Physical Exam  There were no vitals filed for this visit. Gen:   Awake, no distress   Resp:  Normal effort  MSK:   Moves extremities without difficulty  Other:    Medical Decision Making  Given the patient's initial medical screening exam, the following diagnostic evaluation has been ordered. The patient will be placed in the appropriate treatment space, once one is available, to complete the evaluation and treatment. I have discussed the plan of care with the patient and I have advised the patient that an ED physician or mid-level practitioner will reevaluate their condition after the test results have been received, as the results may give them additional insight into the type of treatment they may need.    Diagnostics: Labs, UA, abdominal CT  Treatments: none immediately   Teodoro Spray, [place] 02/18/22 1246

## 2022-02-18 NOTE — Assessment & Plan Note (Addendum)
Patient has been advised to abstain from further alcohol use Labs show AST 2 x ALT Monitor closely for signs and symptoms of alcohol withdrawal CIWA protocol

## 2022-02-18 NOTE — Assessment & Plan Note (Addendum)
Last known LVEF of 60 to 65% Continue Lasix Net IO Since Admission: 3,053.3 mL [02/20/22 1429]

## 2022-02-18 NOTE — ED Notes (Signed)
Pt awake and alert vital signs stable. Pt ambulates to room restroom with steady gait

## 2022-02-18 NOTE — Assessment & Plan Note (Addendum)
Patient presents for evaluation of worsening shortness of breath from her baseline and is noted to be hypoxic with room air pulse oximetry of 86% She is currently on 3 L of oxygen to maintain pulse oximetry greater than 92% -wean as able to room air Acute respiratory failure with hypoxia appears to be multifactorial and related to COVID-19 pneumonia, COPD and acute diastolic dysfunction CHF.

## 2022-02-18 NOTE — ED Triage Notes (Signed)
Pt states she has had blood in her stools since Thursday- pt has also been having abd pain since yesterday- pt states this has happened before- pt states previously she has not had to have a blood transfusion

## 2022-02-18 NOTE — Assessment & Plan Note (Addendum)
Hold metformin Glycemic control with sliding scale insulin.  Blood sugar could run up due to steroids

## 2022-02-18 NOTE — Assessment & Plan Note (Addendum)
Smoking cessation has been discussed with patient in detail by my colleagues Continue nicotine transdermal patch 14 mg daily

## 2022-02-18 NOTE — Assessment & Plan Note (Addendum)
Probably related to alcohol abuse and etiology for patient's epigastric pain.  Now resolved Patient tolerated liquid diet.  Will advance to regular today Supportive care with IV PPI and antiemetics I will stop antibiotics.  Colitis ruled out.  She is tolerating diet.  She does not have any fever or leukocytosis.  Not having any abdominal pain at this time.  Stool for C. difficile negative

## 2022-02-18 NOTE — Assessment & Plan Note (Addendum)
Patient presents for evaluation of abdominal pain mostly in the epigastrium and periumbilical area similar to her prior episodes of pancreatitis Imaging shows chronic pancreatitis without acute inflammation Pain control.  Advance diet to regular today.  She tolerated clear liquid diet

## 2022-02-18 NOTE — Assessment & Plan Note (Signed)
Place patient on IV Protonix

## 2022-02-19 ENCOUNTER — Other Ambulatory Visit: Payer: Self-pay

## 2022-02-19 DIAGNOSIS — J9601 Acute respiratory failure with hypoxia: Secondary | ICD-10-CM | POA: Diagnosis not present

## 2022-02-19 LAB — CBC
HCT: 44.3 % (ref 36.0–46.0)
Hemoglobin: 14.3 g/dL (ref 12.0–15.0)
MCH: 31.4 pg (ref 26.0–34.0)
MCHC: 32.3 g/dL (ref 30.0–36.0)
MCV: 97.4 fL (ref 80.0–100.0)
Platelets: 226 10*3/uL (ref 150–400)
RBC: 4.55 MIL/uL (ref 3.87–5.11)
RDW: 11.9 % (ref 11.5–15.5)
WBC: 3.4 10*3/uL — ABNORMAL LOW (ref 4.0–10.5)
nRBC: 0 % (ref 0.0–0.2)

## 2022-02-19 LAB — CBG MONITORING, ED
Glucose-Capillary: 188 mg/dL — ABNORMAL HIGH (ref 70–99)
Glucose-Capillary: 236 mg/dL — ABNORMAL HIGH (ref 70–99)
Glucose-Capillary: 130 mg/dL — ABNORMAL HIGH (ref 70–99)

## 2022-02-19 LAB — HIV ANTIBODY (ROUTINE TESTING W REFLEX): HIV Screen 4th Generation wRfx: NONREACTIVE

## 2022-02-19 LAB — BASIC METABOLIC PANEL
Anion gap: 9 (ref 5–15)
BUN: 7 mg/dL (ref 6–20)
CO2: 25 mmol/L (ref 22–32)
Calcium: 9 mg/dL (ref 8.9–10.3)
Chloride: 104 mmol/L (ref 98–111)
Creatinine, Ser: 0.75 mg/dL (ref 0.44–1.00)
GFR, Estimated: >60 mL/min (ref >60)
Glucose, Bld: 278 mg/dL — ABNORMAL HIGH (ref 70–99)
Potassium: 3.8 mmol/L (ref 3.5–5.1)
Sodium: 138 mmol/L (ref 135–145)

## 2022-02-19 LAB — GLUCOSE, CAPILLARY: Glucose-Capillary: 197 mg/dL — ABNORMAL HIGH (ref 70–99)

## 2022-02-19 MED ORDER — PREDNISONE 20 MG PO TABS: 40.0000 mg | ORAL_TABLET | Freq: Every day | ORAL | Status: DC

## 2022-02-19 MED ORDER — METRONIDAZOLE 500 MG/100ML IV SOLN
500.0000 mg | Freq: Two times a day (BID) | INTRAVENOUS | Status: DC
  Administered 2022-02-19 – 2022-02-20 (×3): 500 mg via INTRAVENOUS
  Filled 2022-02-19 (×4): qty 100

## 2022-02-19 MED ORDER — POTASSIUM CHLORIDE IN NACL 20-0.9 MEQ/L-% IV SOLN
INTRAVENOUS | Status: DC
  Filled 2022-02-19 (×4): qty 1000

## 2022-02-19 MED ORDER — ONDANSETRON 4 MG PO TBDP
4.0000 mg | ORAL_TABLET | Freq: Three times a day (TID) | ORAL | Status: DC | PRN
  Administered 2022-02-19 – 2022-02-21 (×4): 4 mg via ORAL
  Filled 2022-02-19 (×4): qty 1

## 2022-02-19 MED ORDER — DEXAMETHASONE 4 MG PO TABS
6.0000 mg | ORAL_TABLET | Freq: Every day | ORAL | Status: DC
  Administered 2022-02-20: 6 mg via ORAL
  Filled 2022-02-19: qty 2

## 2022-02-19 MED ORDER — HYDROCODONE-ACETAMINOPHEN 5-325 MG PO TABS
1.0000 | ORAL_TABLET | Freq: Four times a day (QID) | ORAL | Status: DC | PRN
  Administered 2022-02-19 – 2022-02-21 (×7): 1 via ORAL
  Filled 2022-02-19 (×7): qty 1

## 2022-02-19 MED ORDER — SODIUM CHLORIDE 0.9 % IV SOLN
2.0000 g | INTRAVENOUS | Status: DC
  Administered 2022-02-19 – 2022-02-20 (×2): 2 g via INTRAVENOUS
  Filled 2022-02-19: qty 2
  Filled 2022-02-19: qty 20

## 2022-02-19 NOTE — Progress Notes (Signed)
PROGRESS NOTE    Marie [name]  PZW:[id number] DOB: 1965-07-17 DOA: 02/18/2022 PCP: Gaynelle Arabian, MD     Brief Narrative:   From admission h and p Marie [name] is a 56 y.o. female with medical history significant for COPD, nicotine dependence, chronic pancreatitis, hypertension, diabetes mellitus, alcohol dependence who presents to the ER for evaluation of blood in her stools for 3 days. Patient notes that she has had bright red blood per rectum with bowel movement.  She denies having to strain to have a bowel movement and has not been constipated.  She denies any NSAID use and is not on any anticoagulants.  Denies having history of hemorrhoids. She also complains of abdominal pain mostly in the epigastrium/periumbilical area which she thought was secondary to an acute flareup of her known pancreatitis.  She has had nausea but denies having any emesis.  She denies having any melena or hematemesis.  Abdominal pain is rated a 6 x 10 in intensity at its worst with radiation to the back. She has had a cough productive of clear phlegm for about 3 days and worsening shortness of breath from her baseline mostly with exertion but denies having any fever, no chills, no myalgias, no headache. She denies having any chest pain, no palpitations, no diaphoresis, no dizziness, swelling, no blurred vision, no focal deficit. Her SARS coronavirus 2 PCR is positive CT scan of abdomen and pelvis shows mild wall thickening involving the distal transverse colon, splenic flexure, descending and sigmoid colon suggesting mild colitis of infectious, inflammatory, or ischemic etiology. Few fluid-filled nondilated loops of small bowel, question enteritis.Chronic pancreatitis. No acute inflammation centered about the pancreas. Fibroid uterus. Aortic atherosclerosis. Lower lungs are noted to have patchy groundglass opacities Chest x-ray reviewed by me shows cardiomegaly with mild interstitial opacities which may  represent interstitial edema. She was hypoxic upon arrival to the ER with room air pulse oximetry of 86% and is currently on 2L to maintain pulse oximetry greater than 92%. She received a dose of IV Solu-Medrol and Protonix in the ER and will be admitted to the hospital for further evaluation.   Assessment & Plan:   Principal Problem:   Acute respiratory failure with hypoxia (HCC) Active Problems:   Pneumonia due to COVID-19 virus   Pulmonary sarcoidosis (HCC)   Gastritis   Rectal bleeding   Tobacco use   Diabetes mellitus with complication (HCC)   OBESITY   Coronary atherosclerosis   ACID REFLUX DISEASE   Chronic pancreatitis (HCC)   OSA (obstructive sleep apnea)   Alcohol abuse, continuous   COPD (chronic obstructive pulmonary disease) (HCC)   Essential hypertension   Acute on chronic diastolic CHF (congestive heart failure) ([place])   # Acute hypoxic respiratory 2/2 copd, covid. 86% on arrival. Now normal o2 off [place] o2 - monitor  # Covid infection With hypoxia as above - continue decadron - d/c remdesevir as utility is questionable  # hematochezia # Diarrhea CT shows mild wall thickening of distal transverse colon, splenic flexure and descending and sigmoid colon suggestive of colitis 2/2 inflammation, infection, or ischemia. Clinically stable. Had loose stool just prior to my arrival. Do not see report from prior colonoscopy in chart or care everywhere. Hematochezia is mild and hgb is wnl. Is on plavix. - start ceftriaxone/flagyl given concern for ischemic colitis - ns @ 75 - clear liquids - stool studies (pathogen panel and c diff) if has watery BM - would involve gi if bleeding worsens or fails  to improve with above regimen - protonix  # Alcohol abuse Denies history withdrawal - ciwa monitoring for now  # Chronic pancreatitis No abd pain currently  # CAD Hx cabg. No signs ischemia and normal TTE on recent studies - cont home statin - holding home plavix -  bps are soft, holding metop and losartan  # HTN Here bps soft - holding home metop, losartan, hctz  # COPD Doesn't appear exacerbated - dexamethasone as above  # Chronic pain - home norco prn  # T2DM Here glucose elevated - hold home metformin - SSI for now  # Tobacco abuse - patch    DVT prophylaxis: SCDs Code Status: full Family Communication: none @ bedside  Level of care: Telemetry Medical Status is: Inpatient Remains inpatient appropriate because: severity of illness    Consultants:  none  Procedures: none  Antimicrobials:  Start ceftriaxone/flagyl    Subjective: Mild abd discomfort. Just had a loose/watery stool with small amount of blood  Objective: Vitals:   02/19/22 0630 02/19/22 0700 02/19/22 0730 02/19/22 0901  BP: 139/89 95/63 128/84   Pulse:  74    Resp: '14 15 16   '$ Temp:    98.3 F (36.8 C)  TempSrc:    Oral  SpO2:  98%    Weight:      Height:        Intake/Output Summary (Last 24 hours) at 02/19/2022 0919 Last data filed at 02/18/2022 1947 Gross per 24 hour  Intake 750 ml  Output --  Net 750 ml   Filed Weights   02/18/22 1253  Weight: 50.8 kg    Examination:  General exam: Appears calm and comfortable  Respiratory system: Clear to auscultation. Respiratory effort normal. Cardiovascular system: S1 & S2 heard, RRR. No JVD, murmurs, rubs, gallops or clicks. No pedal edema. Gastrointestinal system: Abdomen is nondistended, soft and nontender. No organomegaly or masses felt. Normal bowel sounds heard. Central nervous system: Alert and oriented. No focal neurological deficits. Extremities: Symmetric 5 x 5 power. Skin: No rashes, lesions or ulcers Psychiatry: Judgement and insight appear normal. Mood & affect appropriate.     Data Reviewed: I have personally reviewed following labs and imaging studies  CBC: Recent Labs  Lab 02/18/22 1300 02/19/22 0515  WBC 4.8 3.4*  NEUTROABS 3.6  --   HGB 14.4 14.3  HCT 45.0 44.3   MCV 97.0 97.4  PLT 252 132   Basic Metabolic Panel: Recent Labs  Lab 02/18/22 1300 02/19/22 0515  NA 143 138  K 3.8 3.8  CL 108 104  CO2 27 25  GLUCOSE 197* 278*  BUN 7 7  CREATININE 0.72 0.75  CALCIUM 9.5 9.0   GFR: Estimated Creatinine Clearance: 62.8 mL/min (by C-G formula based on SCr of 0.75 mg/dL). Liver Function Tests: Recent Labs  Lab 02/18/22 1300  AST 88*  ALT 35  ALKPHOS 82  BILITOT 0.8  PROT 7.7  ALBUMIN 3.8   Recent Labs  Lab 02/18/22 1300  LIPASE 25   No results for input(s): "AMMONIA" in the last 168 hours. Coagulation Profile: No results for input(s): "INR", "PROTIME" in the last 168 hours. Cardiac Enzymes: No results for input(s): "CKTOTAL", "CKMB", "CKMBINDEX", "TROPONINI" in the last 168 hours. BNP (last 3 results) No results for input(s): "PROBNP" in the last 8760 hours. HbA1C: No results for input(s): "HGBA1C" in the last 72 hours. CBG: Recent Labs  Lab 02/19/22 0719  GLUCAP 188*   Lipid Profile: No results for input(s): "CHOL", "HDL", "  [place]", "TRIG", "CHOLHDL", "LDLDIRECT" in the last 72 hours. Thyroid Function Tests: No results for input(s): "TSH", "T4TOTAL", "FREET4", "T3FREE", "THYROIDAB" in the last 72 hours. Anemia Panel: No results for input(s): "VITAMINB12", "FOLATE", "FERRITIN", "TIBC", "IRON", "RETICCTPCT" in the last 72 hours. Urine analysis:    Component Value Date/Time   COLORURINE YELLOW (A) 02/18/2022 1300   APPEARANCEUR HAZY (A) 02/18/2022 1300   LABSPEC 1.018 02/18/2022 1300   PHURINE 5.0 02/18/2022 1300   GLUCOSEU NEGATIVE 02/18/2022 1300   HGBUR NEGATIVE 02/18/2022 1300   HGBUR negative 03/16/2010 1107   BILIRUBINUR NEGATIVE 02/18/2022 1300   KETONESUR NEGATIVE 02/18/2022 1300   PROTEINUR NEGATIVE 02/18/2022 1300   UROBILINOGEN 1.0 10/12/2015 1910   NITRITE NEGATIVE 02/18/2022 1300   LEUKOCYTESUR NEGATIVE 02/18/2022 1300   Sepsis Labs: '@LABRCNTIP'$ (procalcitonin:4,lacticidven:4)  ) Recent Results  (from the past 240 hour(s))  Resp Panel by RT-PCR (Flu A&B, Covid) Anterior Nasal Swab     Status: Abnormal   Collection Time: 02/18/22  3:31 PM   Specimen: Anterior Nasal Swab  Result Value Ref Range Status   SARS Coronavirus 2 by RT PCR POSITIVE (A) NEGATIVE Final    Comment: (NOTE) SARS-CoV-2 target nucleic acids are DETECTED.  The SARS-CoV-2 RNA is generally detectable in upper respiratory specimens during the acute phase of infection. Positive results are indicative of the presence of the identified virus, but do not rule out bacterial infection or co-infection with other pathogens not detected by the test. Clinical correlation with patient history and other diagnostic information is necessary to determine patient infection status. The expected result is Negative.  Fact Sheet for Patients: EntrepreneurPulse.com.au  Fact Sheet for Healthcare Providers: IncredibleEmployment.be  This test is not yet approved or cleared by the [place] FDA and  has been authorized for detection and/or diagnosis of SARS-CoV-2 by FDA under an Emergency Use Authorization (EUA).  This EUA will remain in effect (meaning this test can be used) for the duration of  the COVID-19 declaration under Section 564(b)(1) of the A ct, 21 U.S.C. section 360bbb-3(b)(1), unless the authorization is terminated or revoked sooner.     Influenza A by PCR NEGATIVE NEGATIVE Final   Influenza B by PCR NEGATIVE NEGATIVE Final    Comment: (NOTE) The Xpert Xpress SARS-CoV-2/FLU/RSV plus assay is intended as an aid in the diagnosis of influenza from Nasopharyngeal swab specimens and should not be used as a sole basis for treatment. Nasal washings and aspirates are unacceptable for Xpert Xpress SARS-CoV-2/FLU/RSV testing.  Fact Sheet for Patients: EntrepreneurPulse.com.au  Fact Sheet for Healthcare Providers: IncredibleEmployment.be  This  test is not yet approved or cleared by the [place] FDA and has been authorized for detection and/or diagnosis of SARS-CoV-2 by FDA under an Emergency Use Authorization (EUA). This EUA will remain in effect (meaning this test can be used) for the duration of the COVID-19 declaration under Section 564(b)(1) of the Act, 21 U.S.C. section 360bbb-3(b)(1), unless the authorization is terminated or revoked.  Performed at [facility], [address] [place], [place] [address]          Radiology Studies: CT Abdomen Pelvis W Contrast  Result Date: 02/18/2022 CLINICAL DATA:  Abdomen pain EXAM: CT ABDOMEN AND PELVIS WITH CONTRAST TECHNIQUE: Multidetector CT imaging of the abdomen and pelvis was performed using the standard protocol following bolus administration of intravenous contrast. RADIATION DOSE REDUCTION: This exam was performed according to the departmental dose-optimization program which includes automated exposure control, adjustment of the mA and/or kV according to patient  size and/or use of iterative reconstruction technique. CONTRAST:  60m OMNIPAQUE IOHEXOL 350 MG/ML SOLN COMPARISON:  CT 04/04/2021, 01/20/2018 FINDINGS: Lower chest: Mild chronic patchy ground-glass opacity at the bases with similar nodular scarring at the left lung base. No acute airspace disease. Hepatobiliary: Status post cholecystectomy. No focal hepatic abnormality. No intra hepatic biliary dilatation. Similar appearance of the common bile duct Pancreas: Diffuse pancreatic calcification consistent with chronic pancreatitis. No inflammation Spleen: Normal in size without focal abnormality. Adrenals/Urinary Tract: Adrenal glands are unremarkable. Kidneys are normal, without renal calculi, focal lesion, or hydronephrosis. Bladder is unremarkable. Stomach/Bowel: The stomach is nonenlarged. Negative appendix. Scattered fluid-filled nondilated small bowel. Suspicion of mild wall thickening involving the distal  transverse colon, splenic flexure and descending and sigmoid colon. Vascular/Lymphatic: Moderate aortic atherosclerosis. No aneurysm. No suspicious lymph nodes. Reproductive: Globular appearing uterus with fibroids. No adnexal mass Other: Negative for free air or pelvic effusion Musculoskeletal: No acute osseous abnormality IMPRESSION: 1. Suspicion of mild wall thickening involving the distal transverse colon, splenic flexure, descending and sigmoid colon suggesting mild colitis of infectious, inflammatory, or ischemic etiology. Few fluid-filled nondilated loops of small bowel, question enteritis. 2. Chronic pancreatitis. No acute inflammation centered about the pancreas. 3. Fibroid uterus. 4. Aortic atherosclerosis. Aortic Atherosclerosis (ICD10-I70.0). Electronically Signed   By: KDonavan FoilM.D.   On: 02/18/2022 16:38   DG Chest Portable 1 View  Result Date: 02/18/2022 CLINICAL DATA:  Pain and hypoxia. EXAM: PORTABLE CHEST 1 VIEW COMPARISON:  07/20/2021 and prior studies FINDINGS: Telemetry leads overlie the chest. Cardiomegaly and CABG changes noted. Mild interstitial opacities are again noted and may represent interstitial edema. There is no evidence of definite airspace disease, pleural effusion or pneumothorax. No acute bony abnormalities are present. IMPRESSION: Cardiomegaly with mild interstitial opacities which may represent interstitial edema. Electronically Signed   By: JMargarette CanadaM.D.   On: 02/18/2022 16:02        Scheduled Meds:  albuterol  2 puff Inhalation Q6H   vitamin C  500 mg Oral Daily   atorvastatin  40 mg Oral Daily   dexamethasone (DECADRON) injection  6 mg Intravenous QI29N  folic acid  1 mg Oral Daily   furosemide  20 mg Intravenous Daily   insulin aspart  0-15 Units Subcutaneous TID WC   LORazepam  0-4 mg Oral Q6H   Followed by   [Derrill MemoON 02/20/2022] LORazepam  0-4 mg Oral Q12H   losartan  100 mg Oral Daily   metoprolol tartrate  100 mg Oral BID   multivitamin  with minerals  1 tablet Oral Daily   nicotine  14 mg Transdermal Daily   pantoprazole (PROTONIX) IV  40 mg Intravenous Q24H   thiamine  100 mg Oral Daily   Or   thiamine  100 mg Intravenous Daily   zinc sulfate  220 mg Oral Daily   Continuous Infusions:  remdesivir 100 mg in sodium chloride 0.9 % 100 mL IVPB       LOS: 1 day     NDesma Maxim MD [facility]   If 7PM-7AM, please contact night-coverage [email or web address] Password [email or web address]/27/2023, 9:19 AM

## 2022-02-20 DIAGNOSIS — J9601 Acute respiratory failure with hypoxia: Secondary | ICD-10-CM | POA: Diagnosis not present

## 2022-02-20 DIAGNOSIS — K2960 Other gastritis without bleeding: Secondary | ICD-10-CM

## 2022-02-20 DIAGNOSIS — J410 Simple chronic bronchitis: Secondary | ICD-10-CM

## 2022-02-20 DIAGNOSIS — U071 COVID-19: Principal | ICD-10-CM

## 2022-02-20 DIAGNOSIS — E118 Type 2 diabetes mellitus with unspecified complications: Secondary | ICD-10-CM | POA: Diagnosis not present

## 2022-02-20 DIAGNOSIS — J1282 Pneumonia due to coronavirus disease 2019: Secondary | ICD-10-CM

## 2022-02-20 LAB — GASTROINTESTINAL PANEL BY PCR, STOOL (REPLACES STOOL CULTURE)

## 2022-02-20 LAB — C DIFFICILE QUICK SCREEN W PCR REFLEX
C Diff antigen: NEGATIVE
C Diff interpretation: NOT DETECTED
C Diff toxin: NEGATIVE

## 2022-02-20 LAB — GLUCOSE, CAPILLARY
Glucose-Capillary: 107 mg/dL — ABNORMAL HIGH (ref 70–99)
Glucose-Capillary: 194 mg/dL — ABNORMAL HIGH (ref 70–99)
Glucose-Capillary: 134 mg/dL — ABNORMAL HIGH (ref 70–99)
Glucose-Capillary: 129 mg/dL — ABNORMAL HIGH (ref 70–99)

## 2022-02-20 LAB — CBC
HCT: 40.8 % (ref 36.0–46.0)
Hemoglobin: 13.3 g/dL (ref 12.0–15.0)
MCH: 31.8 pg (ref 26.0–34.0)
MCHC: 32.6 g/dL (ref 30.0–36.0)
MCV: 97.6 fL (ref 80.0–100.0)
Platelets: 209 10*3/uL (ref 150–400)
RBC: 4.18 MIL/uL (ref 3.87–5.11)
RDW: 11.7 % (ref 11.5–15.5)
WBC: 9.9 10*3/uL (ref 4.0–10.5)
nRBC: 0 % (ref 0.0–0.2)

## 2022-02-20 LAB — BASIC METABOLIC PANEL
Anion gap: 4 — ABNORMAL LOW (ref 5–15)
BUN: 9 mg/dL (ref 6–20)
CO2: 25 mmol/L (ref 22–32)
Calcium: 8.7 mg/dL — ABNORMAL LOW (ref 8.9–10.3)
Chloride: 109 mmol/L (ref 98–111)
Creatinine, Ser: 0.64 mg/dL (ref 0.44–1.00)
GFR, Estimated: >60 mL/min (ref >60)
Glucose, Bld: 119 mg/dL — ABNORMAL HIGH (ref 70–99)
Potassium: 3.9 mmol/L (ref 3.5–5.1)
Sodium: 138 mmol/L (ref 135–145)

## 2022-02-20 LAB — HEMOGLOBIN A1C
Hgb A1c MFr Bld: 6.1 % — ABNORMAL HIGH (ref 4.8–5.6)
Mean Plasma Glucose: 128 mg/dL

## 2022-02-20 MED ORDER — ENSURE ENLIVE PO LIQD
237.0000 mL | Freq: Two times a day (BID) | ORAL | Status: DC
  Administered 2022-02-21: 237 mL via ORAL

## 2022-02-20 MED ORDER — FUROSEMIDE 40 MG PO TABS
40.0000 mg | ORAL_TABLET | Freq: Every day | ORAL | Status: DC
  Administered 2022-02-20 – 2022-02-21 (×2): 40 mg via ORAL
  Filled 2022-02-20 (×2): qty 1

## 2022-02-20 NOTE — Progress Notes (Signed)
Progress Note   Patient: Marie [name] EXN:[id number] DOB: [birth year] DOA: 02/18/2022     2 DOS: the patient was seen and examined on 02/20/2022   Brief hospital course: 56 y.o. female with medical history significant for COPD, nicotine dependence, chronic pancreatitis, hypertension, diabetes mellitus, alcohol dependence admitted for acute hypoxic respiratory failure likely due to COVID 19 infection and rectal bleed  11/27: on 3 L oxygen, weaning off, advancing diet as tolerated clear liquid  Assessment and Plan: * Acute respiratory failure with hypoxia ([place]) Patient presents for evaluation of worsening shortness of breath from her baseline and is noted to be hypoxic with room air pulse oximetry of 86% She is currently on 3 L of oxygen to maintain pulse oximetry greater than 92% -wean as able to room air Acute respiratory failure with hypoxia appears to be multifactorial and related to COVID-19 pneumonia, COPD and acute diastolic dysfunction CHF.  Pneumonia due to COVID-19 virus Patient presents for evaluation of a cough productive of clear phlegm associated with worsening shortness of breath and was found to be hypoxic COVID-19 PCR is positive Imaging shows bilateral groundglass opacity Patient was on 3 L oxygen.  Wean as able to room air Supportive care with bronchodilator therapy, systemic steroids, antitussives and vitamins  Gastritis Probably related to alcohol abuse and etiology for patient's epigastric pain.  Now resolved Patient tolerated liquid diet.  Will advance to regular today Supportive care with IV PPI and antiemetics I will stop antibiotics.  Colitis ruled out.  She is tolerating diet.  She does not have any fever or leukocytosis.  Not having any abdominal pain at this time.  Stool for C. difficile negative  Rectal bleeding Patient presents for evaluation of bright red blood per rectum and denies having a history of hemorrhoids or diverticular disease.  No further  bleed while in the hospital Hemoglobin is stable  Monitor H&H closely and if any significant drop will consult GI  Tobacco use Smoking cessation has been discussed with patient in detail by my colleagues Continue nicotine transdermal patch 14 mg daily  Acute on chronic diastolic CHF (congestive heart failure) ([place]) Last known LVEF of 60 to 65% Continue Lasix Net IO Since Admission: 3,053.3 mL [02/20/22 1429]   Essential hypertension Holding blood pressure medicine.  Alcohol abuse, continuous Patient has been advised to abstain from further alcohol use Labs show AST 2 x ALT Monitor closely for signs and symptoms of alcohol withdrawal CIWA protocol  Chronic pancreatitis [facility]) Patient presents for evaluation of abdominal pain mostly in the epigastrium and periumbilical area similar to her prior episodes of pancreatitis Imaging shows chronic pancreatitis without acute inflammation Pain control.  Advance diet to regular today.  She tolerated clear liquid diet  ACID REFLUX DISEASE Place patient on IV Protonix  Diabetes mellitus with complication (HCC) Hold metformin Glycemic control with sliding scale insulin.  Blood sugar could run up due to steroids         Subjective: Feeling better wants to eat solid food, complains of mild abdominal pain but much improved than before  Physical Exam: Vitals:   02/19/22 2019 02/20/22 0049 02/20/22 0530 02/20/22 0824  BP: 130/68 131/80 127/82 138/77  Pulse: 64 72 80 62  Resp: '17 17 16 18  '$ Temp: 97.7 F (36.5 C) 97.6 F (36.4 C) 98.4 F (36.9 C) (!) 97.5 F (36.4 C)  TempSrc:   Oral Oral  SpO2: 100% 98% 98% 98%  Weight:      Height:  56 year old female lying in the bed comfortably without any acute distress Lungs clear to auscultation bilaterally Cardiovascular regular rate and rhythm  Abdomen soft, benign Neuro alert and awake, nonfocal Skin no rash or lesion  Data Reviewed:  There are no new results to review at  this time.  Family Communication: None  Disposition: Status is: Inpatient Remains inpatient appropriate because: Weaning her oxygen off and making sure she tolerates regular diet before discharge in next 1 to 2 days  Planned Discharge Destination: Home   DVT prophylaxis-SCDs Time spent: 35 minutes  Author: Max Sane, MD 02/20/2022 2:31 PM  For on call review [email or web address].

## 2022-02-20 NOTE — Progress Notes (Signed)
Initial Nutrition Assessment  DOCUMENTATION CODES:   Non-severe (moderate) malnutrition in context of chronic illness  INTERVENTION:   Ensure Enlive po BID, each supplement provides 350 kcal and 20 grams of protein.  MVI po daily   Pt at high refeed risk; recommend monitor potassium, magnesium and phosphorus labs daily until stable  NUTRITION DIAGNOSIS:   Moderate Malnutrition related to chronic illness (COPD, etoh abuse) as evidenced by moderate fat depletion, severe muscle depletion.  GOAL:   Patient will meet greater than or equal to 90% of their needs  MONITOR:   PO intake, Supplement acceptance, Labs, Weight trends, I & O's, Skin  REASON FOR ASSESSMENT:   Malnutrition Screening Tool    ASSESSMENT:   56 y/o female with h/o COPD, etoh abuse, pancreatitis, DM, OSA, HTN, HLD, CAD and CHF who is admitted with COVID 19 PNA and GI bleed.  Met with pt in room today. Pt reports good appetite and oral intake at baseline but reports decreased oral intake for a day or two pta r/t abdominal pain and nausea. Pt reports eating a sandwich for lunch today which is the first solid food she has eaten in a couple of days. Pt reports some abdominal discomfort after eating. Pt reports that she drinks strawberry Ensure at home. RD dicussed with pt the importance of adequate nutrition needed to preserve lean muscle. RD will add supplements to help pt meet her estimated needs. Pt is at refeed risk. Per chart, pt appears weight stable pta.   Medications reviewed and include: vitamin C, dexamethasone, folic acid, lasix, insulin, MVI, nicotine, protonix, thiamine, zinc    Labs reviewed: K 3.9 wnl Cbgs- 194, 107 x 24 hrs Lipase- 26- 11/26 AIC 6.1- 11/26  NUTRITION - FOCUSED PHYSICAL EXAM:  Flowsheet Row Most Recent Value  Orbital Region No depletion  Upper Arm Region Severe depletion  Thoracic and Lumbar Region Moderate depletion  Buccal Region No depletion  Temple Region Mild depletion   Clavicle Bone Region Mild depletion  Clavicle and Acromion Bone Region Mild depletion  Scapular Bone Region Mild depletion  Dorsal Hand Mild depletion  Patellar Region Severe depletion  Anterior Thigh Region Severe depletion  Posterior Calf Region Severe depletion  Edema (RD Assessment) None  Hair Reviewed  Eyes Reviewed  Mouth Reviewed  Skin Reviewed  Nails Reviewed   Diet Order:   Diet Order             Diet regular Room service appropriate? Yes; Fluid consistency: Thin  Diet effective now                  EDUCATION NEEDS:   Education needs have been addressed  Skin:  Skin Assessment: Reviewed RN Assessment  Last BM:  11/28- type 1  Height:   Ht Readings from Last 1 Encounters:  02/18/22 _0  (1.575 m)    Weight:   Wt Readings from Last 1 Encounters:  02/18/22 50.8 kg    Ideal Body Weight:  50 kg  BMI:  Body mass index is 20.49 kg/m.  Estimated Nutritional Needs:   Kcal:  1500-1700kcal/day  Protein:  75-85g/day  Fluid:  1.5-1.8L/day  Koleen Distance MS, RD, LDN Please refer to [facility] for RD and/or RD on-call/weekend/after hours pager

## 2022-02-20 NOTE — Hospital Course (Signed)
56 y.o. female with medical history significant for COPD, nicotine dependence, chronic pancreatitis, hypertension, diabetes mellitus, alcohol dependence admitted for acute hypoxic respiratory failure likely due to COVID 19 infection and rectal bleed  11/27: on 3 L oxygen, weaning off, advancing diet as tolerated clear liquid

## 2022-02-21 ENCOUNTER — Other Ambulatory Visit: Payer: Self-pay

## 2022-02-21 DIAGNOSIS — J9601 Acute respiratory failure with hypoxia: Secondary | ICD-10-CM | POA: Diagnosis not present

## 2022-02-21 LAB — BASIC METABOLIC PANEL
Anion gap: 8 (ref 5–15)
BUN: 10 mg/dL (ref 6–20)
CO2: 27 mmol/L (ref 22–32)
Calcium: 8.8 mg/dL — ABNORMAL LOW (ref 8.9–10.3)
Chloride: 102 mmol/L (ref 98–111)
Creatinine, Ser: 0.74 mg/dL (ref 0.44–1.00)
GFR, Estimated: >60 mL/min (ref >60)
Glucose, Bld: 396 mg/dL — ABNORMAL HIGH (ref 70–99)
Potassium: 3.9 mmol/L (ref 3.5–5.1)
Sodium: 137 mmol/L (ref 135–145)

## 2022-02-21 LAB — CBC
HCT: 44.1 % (ref 36.0–46.0)
Hemoglobin: 13.9 g/dL (ref 12.0–15.0)
MCH: 30.7 pg (ref 26.0–34.0)
MCHC: 31.5 g/dL (ref 30.0–36.0)
MCV: 97.4 fL (ref 80.0–100.0)
Platelets: 192 10*3/uL (ref 150–400)
RBC: 4.53 MIL/uL (ref 3.87–5.11)
RDW: 11.8 % (ref 11.5–15.5)
WBC: 6 10*3/uL (ref 4.0–10.5)
nRBC: 0 % (ref 0.0–0.2)

## 2022-02-21 LAB — PHOSPHORUS: Phosphorus: 3.5 mg/dL (ref 2.5–4.6)

## 2022-02-21 LAB — MAGNESIUM: Magnesium: 1.7 mg/dL (ref 1.7–2.4)

## 2022-02-21 LAB — GLUCOSE, CAPILLARY
Glucose-Capillary: 323 mg/dL — ABNORMAL HIGH (ref 70–99)
Glucose-Capillary: 252 mg/dL — ABNORMAL HIGH (ref 70–99)

## 2022-02-21 MED ORDER — ADULT MULTIVITAMIN W/MINERALS CH: 1.0000 | ORAL_TABLET | Freq: Every day | ORAL | Status: DC

## 2022-02-21 MED ORDER — GLUCERNA SHAKE PO LIQD
237.0000 mL | Freq: Two times a day (BID) | ORAL | Status: DC
  Administered 2022-02-21: 237 mL via ORAL

## 2022-02-21 MED ORDER — GUAIFENESIN-DM 100-10 MG/5ML PO SYRP
10.0000 mL | ORAL_SOLUTION | ORAL | 0 refills | Status: DC | PRN
  Filled 2022-02-21: qty 118, 2d supply, fill #0

## 2022-02-21 MED ORDER — ALBUTEROL SULFATE HFA 108 (90 BASE) MCG/ACT IN AERS
2.0000 | INHALATION_SPRAY | Freq: Four times a day (QID) | RESPIRATORY_TRACT | 0 refills | Status: DC
  Filled 2022-02-21: qty 6.7, 25d supply, fill #0

## 2022-02-21 MED ORDER — METOPROLOL TARTRATE 50 MG PO TABS
50.0000 mg | ORAL_TABLET | Freq: Two times a day (BID) | ORAL | 1 refills | Status: DC
  Filled 2022-02-21: qty 60, 30d supply, fill #0

## 2022-02-21 MED ORDER — LOSARTAN POTASSIUM 50 MG PO TABS
50.0000 mg | ORAL_TABLET | Freq: Every day | ORAL | 1 refills | Status: DC
  Filled 2022-02-21: qty 30, 30d supply, fill #0
  Filled 2022-05-02: qty 30, 30d supply, fill #1

## 2022-02-21 MED ORDER — ASCORBIC ACID 500 MG PO TABS: 500.0000 mg | ORAL_TABLET | Freq: Every day | ORAL | Status: DC

## 2022-02-21 MED ORDER — ZINC SULFATE 220 (50 ZN) MG PO CAPS: 220.0000 mg | ORAL_CAPSULE | Freq: Every day | ORAL | Status: DC

## 2022-02-21 NOTE — Discharge Summary (Signed)
Physician Discharge Summary   Patient: Marie [name] MRN: [id number] DOB: 08-06-1965  Admit date:     02/18/2022  Discharge date: 02/21/22  Discharge Physician: Ezekiel Slocumb   PCP: Gaynelle Arabian, MD   Recommendations at discharge:    Follow up with Primary Care in 1-2 weeks Follow up on BP control.  BP regimen was reduced at discharge, as BP's were overall controlled during admission with all antihypertensives held.   Repeat BMP, CBC in 1-2 weeks Follow up on glycemic control.  Pt had steroid-induced hyperglycemia during admission for Covid-19 with hypoxia.  Steroids were stopped at discharge given hypoxia was resolved.    Discharge Diagnoses: Principal Problem:   Acute respiratory failure with hypoxia (HCC) Active Problems:   Pneumonia due to COVID-19 virus   Pulmonary sarcoidosis (HCC)   Gastritis   Rectal bleeding   Tobacco use   Diabetes mellitus with complication (HCC)   OBESITY   Coronary atherosclerosis   ACID REFLUX DISEASE   Chronic pancreatitis (HCC)   OSA (obstructive sleep apnea)   Alcohol abuse, continuous   COPD (chronic obstructive pulmonary disease) (HCC)   Essential hypertension   Acute on chronic diastolic CHF (congestive heart failure) ([place])  Resolved Problems:   * No resolved hospital problems. *  Hospital Course: 56 y.o. female with medical history significant for COPD, nicotine dependence, chronic pancreatitis, hypertension, diabetes mellitus, alcohol dependence admitted for acute hypoxic respiratory failure likely due to COVID 19 infection and rectal bleed  11/27: on 3 L oxygen, weaning off, advancing diet as tolerated clear liquid  11/29: Patient weaned off oxygen with stable O2 sats on room air both at rest and with ambulation.  Stable for discharge home today.    Assessment and Plan: * Acute respiratory failure with hypoxia ([place]) Patient presents for evaluation of worsening shortness of breath from her baseline and is noted to be  hypoxic with room air pulse oximetry of 86% She is currently on 3 L of oxygen to maintain pulse oximetry greater than 92% -wean as able to room air Acute respiratory failure with hypoxia appears to be multifactorial and related to COVID-19 pneumonia, COPD and acute diastolic dysfunction CHF.  Pneumonia due to COVID-19 virus Patient presents for evaluation of a cough productive of clear phlegm associated with worsening shortness of breath and was found to be hypoxic COVID-19 PCR is positive Imaging shows bilateral groundglass opacity Patient was on 3 L oxygen.  Wean as able to room air Supportive care with bronchodilator therapy, systemic steroids, antitussives and vitamins  Gastritis Probably related to alcohol abuse and etiology for patient's epigastric pain.  Now resolved Patient tolerated liquid diet.  Will advance to regular today Supportive care with IV PPI and antiemetics I will stop antibiotics.  Colitis ruled out.  She is tolerating diet.  She does not have any fever or leukocytosis.  Not having any abdominal pain at this time.  Stool for C. difficile negative  Rectal bleeding Patient presents for evaluation of bright red blood per rectum and denies having a history of hemorrhoids or diverticular disease.  No further bleed while in the hospital Hemoglobin is stable  Monitor H&H closely and if any significant drop will consult GI  Tobacco use Smoking cessation has been discussed with patient in detail by my colleagues Continue nicotine transdermal patch 14 mg daily  Acute on chronic diastolic CHF (congestive heart failure) ([place]) Last known LVEF of 60 to 65% Continue Lasix Net IO Since Admission: 3,053.3 mL [02/20/22 1429]  Essential hypertension Holding blood pressure medicine.  Alcohol abuse, continuous Patient has been advised to abstain from further alcohol use Labs show AST 2 x ALT Monitor closely for signs and symptoms of alcohol withdrawal CIWA protocol  Chronic  pancreatitis [facility]) Patient presents for evaluation of abdominal pain mostly in the epigastrium and periumbilical area similar to her prior episodes of pancreatitis Imaging shows chronic pancreatitis without acute inflammation Pain control.  Advance diet to regular today.  She tolerated clear liquid diet  ACID REFLUX DISEASE Place patient on IV Protonix  Diabetes mellitus with complication (HCC) Hold metformin Glycemic control with sliding scale insulin.  Blood sugar could run up due to steroids          Consultants: None Procedures performed: None  Disposition: Home Diet recommendation:  Discharge Diet Orders (From admission, onward)     Start     Ordered   02/21/22 0000  Diet - low sodium heart healthy        02/21/22 1149           Cardiac and Carb modified diet DISCHARGE MEDICATION: Allergies as of 02/21/2022       Reactions   Penicillins Other (See Comments)   Unknown    Lisinopril Hives   Other reaction(s): hives/angiod        Medication List     STOP taking these medications    hydrochlorothiazide 25 MG tablet Commonly known as: HYDRODIURIL       TAKE these medications    albuterol 108 (90 Base) MCG/ACT inhaler Commonly known as: VENTOLIN HFA Inhale 2 puffs into the lungs every 6 (six) hours.   ascorbic acid 500 MG tablet Commonly known as: VITAMIN C Take 1 tablet (500 mg total) by mouth daily. Start taking on: February 22, 2022   atorvastatin 40 MG tablet Commonly known as: LIPITOR Take one tablet by mouth daily for cholesterol (1 tablet Orally once a day for cholesterol)   clopidogrel 75 MG tablet Commonly known as: PLAVIX Take 1 tablet by mouth once daily to thin blood (1 tablet Orally Once a day to thin blood) What changed:  how much to take how to take this when to take this   furosemide 40 MG tablet Commonly known as: LASIX Take 1 tablet (40 mg total) by mouth daily.   guaiFENesin-dextromethorphan 100-10 MG/5ML  syrup Commonly known as: ROBITUSSIN DM Take 10 mLs by mouth every 4 (four) hours as needed for cough.   HYDROcodone-acetaminophen 5-325 MG tablet Commonly known as: NORCO/VICODIN Take 1 tablet by mouth every 6 (six) hours as needed for pain.   losartan 50 MG tablet Commonly known as: COZAAR Take 1 tablet (50 mg total) by mouth daily. What changed:  medication strength how much to take how to take this when to take this   metFORMIN 500 MG tablet Commonly known as: GLUCOPHAGE Take 1 tablet by mouth once daily with breakfast for diabetes (1 tablet with breakfast Orally Once a day for diabetes)   metoprolol tartrate 50 MG tablet Commonly known as: LOPRESSOR Take 1 tablet (50 mg total) by mouth 2 (two) times daily. What changed:  medication strength how much to take how to take this when to take this   multivitamin with minerals Tabs tablet Take 1 tablet by mouth daily. Start taking on: February 22, 2022   zinc sulfate 220 (50 Zn) MG capsule Take 1 capsule (220 mg total) by mouth daily. Start taking on: February 22, 2022  Discharge Exam: Filed Weights   02/18/22 1253  Weight: 50.8 kg   General exam: awake, alert, no acute distress, chronically ill appearing HEENT: moist mucus membranes, hearing grossly normal  Respiratory system: CTAB, no wheezes, rales or rhonchi, normal respiratory effort. Cardiovascular system: normal S1/S2, RRR, no JVD, murmurs, rubs, gallops,  no pedal edema.   Gastrointestinal system: soft, NT, ND, no HSM felt, +bowel sounds. Central nervous system: A&O x3. no gross focal neurologic deficits, normal speech Extremities: moves all, no edema, normal tone Skin: dry, intact, normal temperature Psychiatry: normal mood, congruent affect, judgement and insight appear normal   Condition at discharge: stable  The results of significant diagnostics from this hospitalization (including imaging, microbiology, ancillary and laboratory) are listed  below for reference.   Imaging Studies: CT Abdomen Pelvis W Contrast  Result Date: 02/18/2022 CLINICAL DATA:  Abdomen pain EXAM: CT ABDOMEN AND PELVIS WITH CONTRAST TECHNIQUE: Multidetector CT imaging of the abdomen and pelvis was performed using the standard protocol following bolus administration of intravenous contrast. RADIATION DOSE REDUCTION: This exam was performed according to the departmental dose-optimization program which includes automated exposure control, adjustment of the mA and/or kV according to patient size and/or use of iterative reconstruction technique. CONTRAST:  47m OMNIPAQUE IOHEXOL 350 MG/ML SOLN COMPARISON:  CT 04/04/2021, 01/20/2018 FINDINGS: Lower chest: Mild chronic patchy ground-glass opacity at the bases with similar nodular scarring at the left lung base. No acute airspace disease. Hepatobiliary: Status post cholecystectomy. No focal hepatic abnormality. No intra hepatic biliary dilatation. Similar appearance of the common bile duct Pancreas: Diffuse pancreatic calcification consistent with chronic pancreatitis. No inflammation Spleen: Normal in size without focal abnormality. Adrenals/Urinary Tract: Adrenal glands are unremarkable. Kidneys are normal, without renal calculi, focal lesion, or hydronephrosis. Bladder is unremarkable. Stomach/Bowel: The stomach is nonenlarged. Negative appendix. Scattered fluid-filled nondilated small bowel. Suspicion of mild wall thickening involving the distal transverse colon, splenic flexure and descending and sigmoid colon. Vascular/Lymphatic: Moderate aortic atherosclerosis. No aneurysm. No suspicious lymph nodes. Reproductive: Globular appearing uterus with fibroids. No adnexal mass Other: Negative for free air or pelvic effusion Musculoskeletal: No acute osseous abnormality IMPRESSION: 1. Suspicion of mild wall thickening involving the distal transverse colon, splenic flexure, descending and sigmoid colon suggesting mild colitis of  infectious, inflammatory, or ischemic etiology. Few fluid-filled nondilated loops of small bowel, question enteritis. 2. Chronic pancreatitis. No acute inflammation centered about the pancreas. 3. Fibroid uterus. 4. Aortic atherosclerosis. Aortic Atherosclerosis (ICD10-I70.0). Electronically Signed   By: KDonavan FoilM.D.   On: 02/18/2022 16:38   DG Chest Portable 1 View  Result Date: 02/18/2022 CLINICAL DATA:  Pain and hypoxia. EXAM: PORTABLE CHEST 1 VIEW COMPARISON:  07/20/2021 and prior studies FINDINGS: Telemetry leads overlie the chest. Cardiomegaly and CABG changes noted. Mild interstitial opacities are again noted and may represent interstitial edema. There is no evidence of definite airspace disease, pleural effusion or pneumothorax. No acute bony abnormalities are present. IMPRESSION: Cardiomegaly with mild interstitial opacities which may represent interstitial edema. Electronically Signed   By: JMargarette CanadaM.D.   On: 02/18/2022 16:02    Microbiology: Results for orders placed or performed during the hospital encounter of 02/18/22  Resp Panel by RT-PCR (Flu A&B, Covid) Anterior Nasal Swab     Status: Abnormal   Collection Time: 02/18/22  3:31 PM   Specimen: Anterior Nasal Swab  Result Value Ref Range Status   SARS Coronavirus 2 by RT PCR POSITIVE (A) NEGATIVE Final    Comment: (NOTE) SARS-CoV-2 target nucleic  acids are DETECTED.  The SARS-CoV-2 RNA is generally detectable in upper respiratory specimens during the acute phase of infection. Positive results are indicative of the presence of the identified virus, but do not rule out bacterial infection or co-infection with other pathogens not detected by the test. Clinical correlation with patient history and other diagnostic information is necessary to determine patient infection status. The expected result is Negative.  Fact Sheet for Patients: EntrepreneurPulse.com.au  Fact Sheet for Healthcare  Providers: IncredibleEmployment.be  This test is not yet approved or cleared by the [place] FDA and  has been authorized for detection and/or diagnosis of SARS-CoV-2 by FDA under an Emergency Use Authorization (EUA).  This EUA will remain in effect (meaning this test can be used) for the duration of  the COVID-19 declaration under Section 564(b)(1) of the A ct, 21 U.S.C. section 360bbb-3(b)(1), unless the authorization is terminated or revoked sooner.     Influenza A by PCR NEGATIVE NEGATIVE Final   Influenza B by PCR NEGATIVE NEGATIVE Final    Comment: (NOTE) The Xpert Xpress SARS-CoV-2/FLU/RSV plus assay is intended as an aid in the diagnosis of influenza from Nasopharyngeal swab specimens and should not be used as a sole basis for treatment. Nasal washings and aspirates are unacceptable for Xpert Xpress SARS-CoV-2/FLU/RSV testing.  Fact Sheet for Patients: EntrepreneurPulse.com.au  Fact Sheet for Healthcare Providers: IncredibleEmployment.be  This test is not yet approved or cleared by the [place] FDA and has been authorized for detection and/or diagnosis of SARS-CoV-2 by FDA under an Emergency Use Authorization (EUA). This EUA will remain in effect (meaning this test can be used) for the duration of the COVID-19 declaration under Section 564(b)(1) of the Act, 21 U.S.C. section 360bbb-3(b)(1), unless the authorization is terminated or revoked.  Performed at [facility], [address] [place], [place] [address]   Gastrointestinal Panel by PCR , Stool     Status: None   Collection Time: 02/19/22 10:21 AM   Specimen: Stool  Result Value Ref Range Status   Campylobacter species NOT DETECTED NOT DETECTED Final   Plesimonas shigelloides NOT DETECTED NOT DETECTED Final   Salmonella species NOT DETECTED NOT DETECTED Final   Yersinia enterocolitica NOT DETECTED NOT DETECTED Final   Vibrio species NOT  DETECTED NOT DETECTED Final   Vibrio cholerae NOT DETECTED NOT DETECTED Final   Enteroaggregative E coli (EAEC) NOT DETECTED NOT DETECTED Final   Enteropathogenic E coli (EPEC) NOT DETECTED NOT DETECTED Final   Enterotoxigenic E coli (ETEC) NOT DETECTED NOT DETECTED Final   Shiga like toxin producing E coli (STEC) NOT DETECTED NOT DETECTED Final   Shigella/Enteroinvasive E coli (EIEC) NOT DETECTED NOT DETECTED Final   Cryptosporidium NOT DETECTED NOT DETECTED Final   Cyclospora cayetanensis NOT DETECTED NOT DETECTED Final   Entamoeba histolytica NOT DETECTED NOT DETECTED Final   Giardia lamblia NOT DETECTED NOT DETECTED Final   Adenovirus F40/41 NOT DETECTED NOT DETECTED Final   Astrovirus NOT DETECTED NOT DETECTED Final   Norovirus GI/GII NOT DETECTED NOT DETECTED Final   Rotavirus A NOT DETECTED NOT DETECTED Final   Sapovirus (I, II, IV, and V) NOT DETECTED NOT DETECTED Final    Comment: Performed at [facility], [address] [place], [place] [address]  C Difficile Quick Screen w PCR reflex     Status: None   Collection Time: 02/19/22 10:21 AM   Specimen: STOOL  Result Value Ref Range Status   C Diff antigen NEGATIVE NEGATIVE Final  C Diff toxin NEGATIVE NEGATIVE Final   C Diff interpretation No C. difficile detected.  Final    Comment: Performed at [facility], [address] [place], [place] [address]    Labs: CBC: Recent Labs  Lab 02/18/22 1300 02/19/22 0515 02/20/22 0423 02/21/22 0607  WBC 4.8 3.4* 9.9 6.0  NEUTROABS 3.6  --   --   --   HGB 14.4 14.3 13.3 13.9  HCT 45.0 44.3 40.8 44.1  MCV 97.0 97.4 97.6 97.4  PLT 252 226 209 474   Basic Metabolic Panel: Recent Labs  Lab 02/18/22 1300 02/19/22 0515 02/20/22 0423 02/21/22 0607  NA 143 138 138 137  K 3.8 3.8 3.9 3.9  CL 108 104 109 102  CO2 '27 25 25 27  '$ GLUCOSE 197* 278* 119* 396*  BUN '7 7 9 10  '$ CREATININE 0.72 0.75 0.64 0.74  CALCIUM 9.5 9.0 8.7* 8.8*  MG  --   --   --  1.7   PHOS  --   --   --  3.5   Liver Function Tests: Recent Labs  Lab 02/18/22 1300  AST 88*  ALT 35  ALKPHOS 82  BILITOT 0.8  PROT 7.7  ALBUMIN 3.8   CBG: Recent Labs  Lab 02/20/22 1203 02/20/22 1658 02/20/22 1945 02/21/22 0751 02/21/22 1130  GLUCAP 194* 134* 129* 323* 252*    Discharge time spent: less than 30 minutes.  Signed: Ezekiel Slocumb, DO [facility] 02/21/2022

## 2022-02-21 NOTE — Progress Notes (Signed)
SATURATION QUALIFICATIONS: (This note is used to comply with regulatory documentation for home oxygen)  Patient Saturations on Room Air at Rest = 98%  Patient Saturations on Room Air while Ambulating =94%     

## 2022-02-21 NOTE — Inpatient Diabetes Management (Signed)
Inpatient Diabetes Program Recommendations  AACE/ADA: New Consensus Statement on Inpatient Glycemic Control (2015)  Target Ranges:  Prepandial:   less than 140 mg/dL      Peak postprandial:   less than 180 mg/dL (1-2 hours)      Critically ill patients:  140 - 180 mg/dL    Latest Reference Range & Units 02/20/22 08:23 02/20/22 12:03 02/20/22 16:58 02/20/22 19:45  Glucose-Capillary 70 - 99 mg/dL 107 (H) 194 (H)  3 units Novolog  134 (H)  2 units Novolog  129 (H)  (H): Data is abnormally high      Home DM Meds: Metformin 500 mg daily  Current Orders: Novolog 0-15 units TID    Decadron 6 mg QHS   --Will follow patient during hospitalization--  Wyn Quaker RN, MSN, [place] Diabetes Coordinator Inpatient Glycemic Control Team Team Pager: [phone] (8a-5p)

## 2022-03-07 ENCOUNTER — Other Ambulatory Visit: Payer: Self-pay

## 2022-03-09 ENCOUNTER — Other Ambulatory Visit: Payer: Self-pay

## 2022-03-09 MED ORDER — HYDROCODONE-ACETAMINOPHEN 5-325 MG PO TABS
1.0000 | ORAL_TABLET | Freq: Four times a day (QID) | ORAL | 0 refills | Status: DC | PRN
  Filled 2022-03-09: qty 24, 6d supply, fill #0

## 2022-04-04 ENCOUNTER — Other Ambulatory Visit: Payer: Self-pay

## 2022-04-04 MED ORDER — ALBUTEROL SULFATE HFA 108 (90 BASE) MCG/ACT IN AERS
2.0000 | INHALATION_SPRAY | Freq: Four times a day (QID) | RESPIRATORY_TRACT | 1 refills | Status: DC
  Filled 2022-04-04: qty 6.7, 25d supply, fill #0

## 2022-04-05 ENCOUNTER — Other Ambulatory Visit: Payer: Self-pay

## 2022-04-05 MED ORDER — HYDROCODONE-ACETAMINOPHEN 5-325 MG PO TABS
1.0000 | ORAL_TABLET | Freq: Four times a day (QID) | ORAL | 0 refills | Status: DC | PRN
  Filled 2022-04-05: qty 24, 6d supply, fill #0

## 2022-04-13 DIAGNOSIS — Z789 Other specified health status: Secondary | ICD-10-CM

## 2022-04-13 DIAGNOSIS — K922 Gastrointestinal hemorrhage, unspecified: Secondary | ICD-10-CM

## 2022-04-23 ENCOUNTER — Ambulatory Visit: Payer: Commercial Managed Care - HMO | Admitting: Pulmonary Disease

## 2022-04-23 ENCOUNTER — Encounter: Payer: Self-pay | Admitting: Pulmonary Disease

## 2022-04-23 VITALS — BP 130/84 | HR 91 | Ht 62.0 in | Wt 114.0 lb

## 2022-04-23 DIAGNOSIS — J432 Centrilobular emphysema: Secondary | ICD-10-CM | POA: Diagnosis not present

## 2022-04-23 DIAGNOSIS — G4733 Obstructive sleep apnea (adult) (pediatric): Secondary | ICD-10-CM | POA: Diagnosis not present

## 2022-04-23 DIAGNOSIS — F17210 Nicotine dependence, cigarettes, uncomplicated: Secondary | ICD-10-CM

## 2022-04-23 MED ORDER — TRELEGY ELLIPTA 100-62.5-25 MCG/ACT IN AEPB: 1.0000 | INHALATION_SPRAY | Freq: Every day | RESPIRATORY_TRACT | 0 refills | Status: DC

## 2022-04-23 NOTE — Progress Notes (Unsigned)
Synopsis: Referred in April 2023 for Emphysema  Subjective:   PATIENT ID: Marie [name] GENDER: female DOB: [birth year], MRN: [id number]  HPI  Chief Complaint  Patient presents with   Follow-up    6 mo f/u for emphysema. States she has noticed that her SOB has increased over the past few months with exertion.    Marie [name] is a 57 year old woman, daily smoker with history of sarcoidosis, HFpEF, OSA not on CPAP, hypertension and DM II who returns to pulmonary clinic for emphysema.    She was recently admitted to [facility] 11/27 to 11/29 for acute hypoxemic respiratory failure due to covid 19 pneumonia and rectal bleeding. She initally required 3L of O2 and was successfully weaned off. She reports her shortness of breath continues to progress.  She has not had home sleep study yet.   She continues to smoke daily.   OV 10/16/21 She continues to smoke half a pack per day. She is using stiolto as needed and is taking it 1-2 times per week. She is not using albuterol.   PFTs today show moderate obstruction and severe diffusion defect.   Initial OV 07/12/21 She was admitted for covid 19 10/2020 and CT Chest from that time showed mild centrilobular emphysema.   PFTs from 2015 showed moderate obstructive defect and severe diffusion defect.   She has dyspnea with exertion and has night time awakenings due to shortness of breath. She has history of sleep apnea and is not on CPAP due to insurance issues years ago. She is interested in having an updated sleep study to resume CPAP if needed. She has daily cough with sputum production in the mornings. She has intermittent wheezing. She denies issues with allergies. She has trouble with her breathing on hot humid days.   She is smoking half a pack per day. She has been smoking since age 65 and was previously smoking 1 pack per day. She works as a Set designer. She denies any harmful dust or chemical exposures. She lives with her husband and dog.     Past Medical History:  Diagnosis Date   (HFpEF) heart failure with preserved ejection fraction ([place])    a. 10/2018 EF >65% by SPECT.   Chronic pancreatitis (HCC)    Coronary atherosclerosis of unspecified type of vessel, native or graft    a. 09/2018 s/p CABG x 3; b. 07/2009 s/p PCI-->LCX; 10/2018 MV: Low risk w/ small/mild apical inf/lateral reversible defect - likley artifact. EF >65%.   Family history of pancreatic cancer    8/23 cancer genetic testing letter sent   Headache(784.0)    "weekly" (07/29/2013)   Hyperlipidemia LDL goal <70    Hypertension    Leiomyoma of uterus, unspecified    Loss of weight    Obesity, unspecified    OSA (obstructive sleep apnea)    no CPAP   Pulmonary sarcoidosis (HCC)    Pure hypercholesterolemia    Shortness of breath    none since stent 2 yrs ago   Type II diabetes mellitus ([place])      Family History  Problem Relation Age of Onset   Diabetes Mother    Coronary artery disease Mother    Heart disease Mother    Hyperlipidemia Mother    Sudden death Mother    Pancreatic cancer Mother    Stroke Maternal Grandmother    Colon cancer Maternal Grandfather    Liver disease Maternal Uncle    Heart disease Maternal Uncle  Heart disease Paternal Aunt    Breast cancer Neg Hx      Social History   Socioeconomic History   Marital status: Married    Spouse name: Not on file   Number of children: Not on file   Years of education: Not on file   Highest education level: Not on file  Occupational History   Occupation: Optometrist   Occupation: Private nursing aid  Tobacco Use   Smoking status: Every Day    Packs/day: 0.50    Years: 32.00    Total pack years: 16.00    Types: Cigarettes   Smokeless tobacco: Never  Vaping Use   Vaping Use: Never used  Substance and Sexual Activity   Alcohol use: Yes    Alcohol/week: 24.0 standard drinks of alcohol    Types: 24 Glasses of wine per week    Comment: 08/28/2016 "3-4 glasses of wine qd; last glass  was last night",   Drug use: No   Sexual activity: Not Currently    Birth control/protection: Surgical  Other Topics Concern   Not on file  Social History Narrative   Not on file   Social Determinants of Health   Financial Resource Strain: Not on file  Food Insecurity: No Food Insecurity (02/19/2022)   Hunger Vital Sign    Worried About Running Out of Food in the Last Year: Never true    Ran Out of Food in the Last Year: Never true  Transportation Needs: No Transportation Needs (02/19/2022)   PRAPARE - Hydrologist (Medical): No    Lack of Transportation (Non-Medical): No  Physical Activity: Not on file  Stress: Not on file  Social Connections: Not on file  Intimate Partner Violence: Not At Risk (02/19/2022)   Humiliation, Afraid, Rape, and Kick questionnaire    Fear of Current or Ex-Partner: No    Emotionally Abused: No    Physically Abused: No    Sexually Abused: No     Allergies  Allergen Reactions   Penicillins Other (See Comments)    Unknown    Lisinopril Hives    Other reaction(s): hives/angiod     Outpatient Medications Prior to Visit  Medication Sig Dispense Refill   albuterol (VENTOLIN HFA) 108 (90 Base) MCG/ACT inhaler Inhale 2 puffs into the lungs every 6 (six) hours. 6.7 g 1   ascorbic acid (VITAMIN C) 500 MG tablet Take 1 tablet (500 mg total) by mouth daily.     atorvastatin (LIPITOR) 40 MG tablet 1 tablet Orally once a day for cholesterol 90 tablet 2   clopidogrel (PLAVIX) 75 MG tablet 1 tablet Orally Once a day to thin blood (Patient taking differently: Take 75 mg by mouth daily.) 90 tablet 2   guaiFENesin-dextromethorphan (ROBITUSSIN DM) 100-10 MG/5ML syrup Take 10 mLs by mouth every 4 (four) hours as needed for cough. 118 mL 0   HYDROcodone-acetaminophen (NORCO/VICODIN) 5-325 MG tablet Take 1 tablet by mouth every 6 (six) hours as needed. for pain 24 tablet 0   metFORMIN (GLUCOPHAGE) 500 MG tablet 1 tablet with breakfast  Orally Once a day for diabetes 90 tablet 2   metoprolol tartrate (LOPRESSOR) 50 MG tablet Take 1 tablet (50 mg total) by mouth 2 (two) times daily. 60 tablet 1   Multiple Vitamin (MULTIVITAMIN WITH MINERALS) TABS tablet Take 1 tablet by mouth daily.     zinc sulfate 220 (50 Zn) MG capsule Take 1 capsule (220 mg total) by mouth daily.  losartan (COZAAR) 50 MG tablet Take 1 tablet (50 mg total) by mouth daily. 30 tablet 1   furosemide (LASIX) 40 MG tablet Take 1 tablet (40 mg total) by mouth daily. (Patient not taking: Reported on 02/19/2022) 30 tablet 0   No facility-administered medications prior to visit.   Review of Systems  Constitutional:  Negative for chills, fever, malaise/fatigue and weight loss.  HENT:  Negative for congestion, sinus pain and sore throat.   Eyes: Negative.   Respiratory:  Positive for cough, sputum production, shortness of breath and wheezing. Negative for hemoptysis.   Cardiovascular:  Negative for chest pain, palpitations, orthopnea, claudication and leg swelling.  Gastrointestinal:  Negative for abdominal pain, heartburn, nausea and vomiting.  Genitourinary: Negative.   Musculoskeletal:  Negative for joint pain and myalgias.  Skin:  Negative for rash.  Neurological:  Negative for weakness.  Endo/Heme/Allergies: Negative.   Psychiatric/Behavioral: Negative.      Objective:   Vitals:   04/23/22 0944  BP: 130/84  Pulse: 91  SpO2: 94%  Weight: 114 lb (51.7 kg)  Height: '5\' 2"'$  (1.575 m)   Physical Exam Constitutional:      General: She is not in acute distress.    Appearance: She is not ill-appearing.  HENT:     Head: Normocephalic and atraumatic.  Eyes:     General: No scleral icterus. Cardiovascular:     Rate and Rhythm: Normal rate and regular rhythm.     Pulses: Normal pulses.     Heart sounds: Normal heart sounds. No murmur heard. Pulmonary:     Effort: Pulmonary effort is normal.     Breath sounds: Normal breath sounds. No wheezing,  rhonchi or rales.  Musculoskeletal:     Right lower leg: No edema.     Left lower leg: No edema.  Skin:    General: Skin is warm and dry.  Neurological:     General: No focal deficit present.     Mental Status: She is alert.  Psychiatric:        Mood and Affect: Mood normal.        Behavior: Behavior normal.        Thought Content: Thought content normal.        Judgment: Judgment normal.    CBC    Component Value Date/Time   WBC 6.0 02/21/2022 0607   RBC 4.53 02/21/2022 0607   HGB 13.9 02/21/2022 0607   HGB 15.6 10/22/2005 1110   HCT 44.1 02/21/2022 0607   HCT 47.0 (H) 10/22/2005 1110   PLT 192 02/21/2022 0607   PLT 219 10/22/2005 1110   MCV 97.4 02/21/2022 0607   MCV 94.9 02/24/2015 1405   MCV 88 10/22/2005 1110   MCH 30.7 02/21/2022 0607   MCHC 31.5 02/21/2022 0607   RDW 11.8 02/21/2022 0607   RDW 13.2 10/22/2005 1110   LYMPHSABS 0.8 02/18/2022 1300   LYMPHSABS 2.0 10/22/2005 1110   MONOABS 0.3 02/18/2022 1300   EOSABS 0.0 02/18/2022 1300   EOSABS 0.1 10/22/2005 1110   BASOSABS 0.0 02/18/2022 1300   BASOSABS 0.1 10/22/2005 1110      Latest Ref Rng & Units 02/21/2022    6:07 AM 02/20/2022    4:23 AM 02/19/2022    5:15 AM  BMP  Glucose 70 - 99 mg/dL 396  119  278   BUN 6 - 20 mg/dL '10  9  7   '$ Creatinine 0.44 - 1.00 mg/dL 0.74  0.64  0.75   Sodium 135 -  145 mmol/L 137  138  138   Potassium 3.5 - 5.1 mmol/L 3.9  3.9  3.8   Chloride 98 - 111 mmol/L 102  109  104   CO2 22 - 32 mmol/L '27  25  25   '$ Calcium 8.9 - 10.3 mg/dL 8.8  8.7  9.0    Chest imaging: CTA Chest 11/10/20 1. No evidence of pulmonary embolism. 2. Moderate severity posterior bibasilar atelectasis and/or infiltrate. 3. Centrilobular emphysema with mild to moderate severity bilateral upper lobe linear scarring and/or atelectasis. 4. Stable area of patchy appearing focal scarring along the posterior aspect of the lung base. 5. Findings consistent with chronic pancreatitis.  HRCT Chest  2015 1. Since 03/23/2010, similar findings related to chronic pulmonary  sarcoidosis. Progression of concurrent centrilobular emphysema.  2. Similar calcified thoracic adenopathy, consistent with  sarcoidosis.  3. Interval development of chronic calcific pancreatitis,  incompletely imaged.  4. Markedly age advanced atherosclerosis, status post median  sternotomy.   PFT:    Latest Ref Rng & Units 10/16/2021   12:00 PM  PFT Results  FVC-Pre L 1.93   FVC-Predicted Pre % 61   FVC-Post L 1.93   FVC-Predicted Post % 61   Pre FEV1/FVC % % 68   Post FEV1/FCV % % 71   FEV1-Pre L 1.31   FEV1-Predicted Pre % 53   FEV1-Post L 1.37   DLCO uncorrected ml/min/mmHg 6.09   DLCO UNC% % 32   DLCO corrected ml/min/mmHg 6.09   DLCO COR %Predicted % 32   DLVA Predicted % 47   TLC L 3.73   TLC % Predicted % 79   RV % Predicted % 96   2012: Moderate obstruction and severe diffusion defect  Labs:  Path:  Echo:  Heart Catheterization:  Assessment & Plan:   Centrilobular emphysema ([place]) - Plan: Home sleep test  Cigarette smoker - Plan: Ambulatory Referral for Lung Cancer Scre  OSA (obstructive sleep apnea) - Plan: Home sleep test  Discussion: Marie [name] is a 57 year old woman, daily smoker with history of sarcoidosis, HFpEF, OSA not on CPAP, hypertension and DM II who returns to pulmonary clinic for emphysema.   She has moderate obstructive defect and severe diffusion defect based on PFTs which is similar to her prior studies.  She is to start trelegy ellipta 1 puff daily and let us know if she has benefit.   We will order a home sleep study for her history of OSA and on going symptoms of snoring and variable breathing at night.   Referral to lung cancer screening program placed.   Follow up in 6 months.   Freda Jackson, MD [facility] Office: [phone]   Current Outpatient Medications:    albuterol (VENTOLIN HFA) 108 (90 Base) MCG/ACT inhaler,  Inhale 2 puffs into the lungs every 6 (six) hours., Disp: 6.7 g, Rfl: 1   ascorbic acid (VITAMIN C) 500 MG tablet, Take 1 tablet (500 mg total) by mouth daily., Disp: , Rfl:    atorvastatin (LIPITOR) 40 MG tablet, 1 tablet Orally once a day for cholesterol, Disp: 90 tablet, Rfl: 2   clopidogrel (PLAVIX) 75 MG tablet, 1 tablet Orally Once a day to thin blood (Patient taking differently: Take 75 mg by mouth daily.), Disp: 90 tablet, Rfl: 2   Fluticasone-Umeclidin-Vilant (TRELEGY ELLIPTA) 100-62.5-25 MCG/ACT AEPB, Inhale 1 puff into the lungs daily., Disp: 2 each, Rfl: 0   guaiFENesin-dextromethorphan (ROBITUSSIN DM) 100-10 MG/5ML syrup, Take 10 mLs by  mouth every 4 (four) hours as needed for cough., Disp: 118 mL, Rfl: 0   HYDROcodone-acetaminophen (NORCO/VICODIN) 5-325 MG tablet, Take 1 tablet by mouth every 6 (six) hours as needed. for pain, Disp: 24 tablet, Rfl: 0   metFORMIN (GLUCOPHAGE) 500 MG tablet, 1 tablet with breakfast Orally Once a day for diabetes, Disp: 90 tablet, Rfl: 2   metoprolol tartrate (LOPRESSOR) 50 MG tablet, Take 1 tablet (50 mg total) by mouth 2 (two) times daily., Disp: 60 tablet, Rfl: 1   Multiple Vitamin (MULTIVITAMIN WITH MINERALS) TABS tablet, Take 1 tablet by mouth daily., Disp: , Rfl:    zinc sulfate 220 (50 Zn) MG capsule, Take 1 capsule (220 mg total) by mouth daily., Disp: , Rfl:    losartan (COZAAR) 50 MG tablet, Take 1 tablet (50 mg total) by mouth daily., Disp: 30 tablet, Rfl: 1

## 2022-04-23 NOTE — Patient Instructions (Addendum)
Start trelegy ellipta 1 puff daily - rinse mouth out after each use  Please let us know if you notice benefit and we will discuss with our pharmacy team which inhaler is covered best by your insurance.   Use albuterol inhaler 1-2 puffs every 4-6 hours as needed  We will schedule you for a home sleep study  We will refer you to our lung cancer screening team  Follow up in 6 months

## 2022-04-24 ENCOUNTER — Other Ambulatory Visit (HOSPITAL_COMMUNITY): Payer: Self-pay

## 2022-04-26 ENCOUNTER — Encounter: Payer: Self-pay | Admitting: Pulmonary Disease

## 2022-05-02 ENCOUNTER — Other Ambulatory Visit: Payer: Self-pay

## 2022-05-04 ENCOUNTER — Other Ambulatory Visit: Payer: Self-pay

## 2022-05-04 MED ORDER — HYDROCODONE-ACETAMINOPHEN 5-325 MG PO TABS
1.0000 | ORAL_TABLET | Freq: Four times a day (QID) | ORAL | 0 refills | Status: DC | PRN
  Filled 2022-05-04: qty 24, 6d supply, fill #0

## 2022-05-21 ENCOUNTER — Other Ambulatory Visit: Payer: Self-pay | Admitting: *Deleted

## 2022-05-21 DIAGNOSIS — Z122 Encounter for screening for malignant neoplasm of respiratory organs: Secondary | ICD-10-CM

## 2022-05-21 DIAGNOSIS — Z87891 Personal history of nicotine dependence: Secondary | ICD-10-CM

## 2022-05-21 NOTE — Progress Notes (Unsigned)
Cardiology Office Note:   Date:  05/23/2022  NAME:  Marie [name]    MRN: [id number] DOB:  [birth year]   PCP:  Gaynelle Arabian, MD  Cardiologist:  Ida Rogue, MD  Electrophysiologist:  None   Referring MD: Gaynelle Arabian, MD   Chief Complaint  Patient presents with   Follow-up        History of Present Illness:   Marie [name] is a 57 y.o. female with a hx of CAD s/p CABG, HTN, DM, HLD who presents for follow-up.  She reports she is still having tightness in her chest.  Described as tightness.  Occurs with heavy exertion.  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  She reports getting her ready for bed can be difficult.  She has to exert herself.  She gets tightness in her chest.  Symptoms improved with stopping.  Nuclear medicine stress test was normal.  Echo was normal.  We discussed giving a trial of Imdur.  She may have small vessel disease.  Her oxygen level is also 89% at rest.  She has pretty bad COPD.  I recommended she reach out to her pulmonary doctor about a walk test.  Her blood pressure is controlled.  Lipids are at goal.  She denies any symptoms today in office.  Problem List CAD s/p CABG -CABG x 3 09/1998 @ [facility]  -PCI LCX 07/2009 2. HTN 3. DM -A1c 6.1 4. HLD -T chol 139, HDL 61, LDL 63, TG 71 5. Pulmonary sarcoidosis  6. Etoh  7. Chronic pancreatitis  8. COPD/pHTN -moderate obstructive  -severe diffusion defect   Past Medical History: Past Medical History:  Diagnosis Date   (HFpEF) heart failure with preserved ejection fraction ([place])    a. 10/2018 EF >65% by SPECT.   Chronic pancreatitis (HCC)    Coronary atherosclerosis of unspecified type of vessel, native or graft    a. 09/2018 s/p CABG x 3; b. 07/2009 s/p PCI-->LCX; 10/2018 MV: Low risk w/ small/mild apical inf/lateral reversible defect - likley artifact. EF >65%.   Family history of pancreatic cancer    8/23 cancer genetic testing letter sent   Headache(784.0)    "weekly"  (07/29/2013)   Hyperlipidemia LDL goal <70    Hypertension    Leiomyoma of uterus, unspecified    Loss of weight    Obesity, unspecified    OSA (obstructive sleep apnea)    no CPAP   Pulmonary sarcoidosis ([place])    Pure hypercholesterolemia    Shortness of breath    none since stent 2 yrs ago   Type II diabetes mellitus ([place])     Past Surgical History: Past Surgical History:  Procedure Laterality Date   BREAST CYST ASPIRATION     does not remember which breast   BREAST EXCISIONAL BIOPSY     does not remember which breast   CORONARY ANGIOPLASTY WITH STENT PLACEMENT  09/2009   "1"   CORONARY ARTERY BYPASS GRAFT  ~ 2000   CABG X3   HYSTEROSCOPY WITH NOVASURE N/A 09/04/2012   Procedure: HYSTEROSCOPY WITH NOVASURE;  Surgeon: Mora Bellman, MD;  Location: [facility];  Service: Gynecology;  Laterality: N/A;  with removal intrauterine device   LEEP N/A 12/25/2018   Procedure: LOOP ELECTROSURGICAL EXCISION PROCEDURE (LEEP);  Surgeon: Gae Dry, MD;  Location: [facility];  Service: Gynecology;  Laterality: N/A;   ROBOTIC ASSISTED LAPAROSCOPIC CHOLECYSTECTOMY-MULTI SITE N/A 01/14/2018   Procedure: ROBOTIC ASSISTED LAPAROSCOPIC CHOLECYSTECTOMY-MULTI SITE;  Surgeon:  Pabon, Marjory Lies, MD;  Location: [facility];  Service: General;  Laterality: N/A;   TUBAL LIGATION  1997    Current Medications: Current Meds  Medication Sig   atorvastatin (LIPITOR) 40 MG tablet 1 tablet Orally once a day for cholesterol   clopidogrel (PLAVIX) 75 MG tablet 1 tablet Orally Once a day to thin blood (Patient taking differently: Take 75 mg by mouth daily.)   Fluticasone-Umeclidin-Vilant (TRELEGY ELLIPTA) 100-62.5-25 MCG/ACT AEPB Inhale 1 puff into the lungs daily.   HYDROcodone-acetaminophen (NORCO/VICODIN) 5-325 MG tablet Take 1 tablet by mouth every 6 (six) hours as needed.   isosorbide mononitrate (IMDUR) 30 MG 24 hr tablet Take 1 tablet (30 mg total) by mouth daily.   metFORMIN (GLUCOPHAGE) 500 MG tablet 1 tablet  with breakfast Orally Once a day for diabetes   metoprolol tartrate (LOPRESSOR) 50 MG tablet Take 1 tablet (50 mg total) by mouth 2 (two) times daily.   [DISCONTINUED] losartan (COZAAR) 100 MG tablet Take 1 tablet (100 mg total) by mouth daily for BP and diabetic kidney protection.     Allergies:    Penicillins and Lisinopril   Social History: Social History   Socioeconomic History   Marital status: Married    Spouse name: Not on file   Number of children: Not on file   Years of education: Not on file   Highest education level: Not on file  Occupational History   Occupation: Optometrist   Occupation: Private nursing aid  Tobacco Use   Smoking status: Every Day    Packs/day: 0.50    Years: 32.00    Total pack years: 16.00    Types: Cigarettes   Smokeless tobacco: Never  Vaping Use   Vaping Use: Never used  Substance and Sexual Activity   Alcohol use: Yes    Alcohol/week: 24.0 standard drinks of alcohol    Types: 24 Glasses of wine per week    Comment: 08/28/2016 "3-4 glasses of wine qd; last glass was last night",   Drug use: No   Sexual activity: Not Currently    Birth control/protection: Surgical  Other Topics Concern   Not on file  Social History Narrative   Not on file   Social Determinants of Health   Financial Resource Strain: Not on file  Food Insecurity: No Food Insecurity (02/19/2022)   Hunger Vital Sign    Worried About Running Out of Food in the Last Year: Never true    Ran Out of Food in the Last Year: Never true  Transportation Needs: No Transportation Needs (02/19/2022)   PRAPARE - Hydrologist (Medical): No    Lack of Transportation (Non-Medical): No  Physical Activity: Not on file  Stress: Not on file  Social Connections: Not on file     Family History: The patient's family history includes Colon cancer in her maternal grandfather; Coronary artery disease in her mother; Diabetes in her mother; Heart disease in her  maternal uncle, mother, and paternal aunt; Hyperlipidemia in her mother; Liver disease in her maternal uncle; Pancreatic cancer in her mother; Stroke in her maternal grandmother; Sudden death in her mother. There is no history of Breast cancer.  ROS:   All other ROS reviewed and negative. Pertinent positives noted in the HPI.     EKGs/Labs/Other Studies Reviewed:   The following studies were personally reviewed by me today:  TTE 10/31/2021  1. Left ventricular ejection fraction, by estimation, is 60 to 65%. The  left ventricle  has normal function. The left ventricle has no regional  wall motion abnormalities. There is mild left ventricular hypertrophy of  the basal-septal segment. Left  ventricular diastolic parameters were normal.   2. Right ventricular systolic function is normal. The right ventricular  size is normal. There is mildly elevated pulmonary artery systolic  pressure. The estimated right ventricular systolic pressure is 99991111 mmHg.   3. The mitral valve is normal in structure. No evidence of mitral valve  regurgitation. No evidence of mitral stenosis.   4. Tricuspid valve regurgitation is moderate.   5. The aortic valve is normal in structure. Aortic valve regurgitation is  not visualized. No aortic stenosis is present.   6. The inferior vena cava is normal in size with greater than 50%  respiratory variability, suggesting right atrial pressure of 3 mmHg.   NM Stress 11/06/2021   Nuclear stress EF: 68 %. The left ventricular ejection fraction is hyperdynamic (>65%).   Normal sinus rhythm with no electrocardiographic changes.   There is normal radiotracer uptake at both rest and stress.  No ischemia, no infarction.   Low risk nuclear stress test.    Recent Labs: 02/18/2022: ALT 35; B Natriuretic Peptide 183.2 02/21/2022: BUN 10; Creatinine, Ser 0.74; Hemoglobin 13.9; Magnesium 1.7; Platelets 192; Potassium 3.9; Sodium 137   Recent Lipid Panel    Component Value  Date/Time   CHOL 210 (H) 03/22/2011 1449   TRIG 99 08/13/2012 0415   HDL 52 03/22/2011 1449   CHOLHDL 4.0 03/22/2011 1449   VLDL 23 03/22/2011 1449   LDLCALC 135 (H) 03/22/2011 1449    Physical Exam:   VS:  BP 110/78   Pulse 65   Ht '5\' 2"'$  (1.575 m)   Wt 115 lb 6.4 oz (52.3 kg)   SpO2 (!) 89%   BMI 21.11 kg/m    Wt Readings from Last 3 Encounters:  05/23/22 115 lb 6.4 oz (52.3 kg)  04/23/22 114 lb (51.7 kg)  02/18/22 112 lb (50.8 kg)    General: Well nourished, well developed, in no acute distress Head: Atraumatic, normal size  Eyes: PEERLA, EOMI  Neck: Supple, no JVD Endocrine: No thryomegaly Cardiac: Normal S1, S2; RRR; no murmurs, rubs, or gallops Lungs: Clear to auscultation bilaterally, no wheezing, rhonchi or rales  Abd: Soft, nontender, no hepatomegaly  Ext: No edema, pulses 2+ Musculoskeletal: No deformities, BUE and BLE strength normal and equal Skin: Warm and dry, no rashes   Neuro: Alert and oriented to person, place, time, and situation, CNII-XII grossly intact, no focal deficits  Psych: Normal mood and affect   ASSESSMENT:   REACE FARQUHAR is a 57 y.o. female who presents for the following: 1. Coronary artery disease involving coronary bypass graft of native heart without angina pectoris   2. Mixed hyperlipidemia   3. Primary hypertension   4. Tobacco abuse     PLAN:   1. Coronary artery disease involving coronary bypass graft of native heart without angina pectoris 2. Mixed hyperlipidemia -Status post CABG in 2000 at [facility].  Underwent PCI to left circumflex in 2011.  Continues to have exertional chest tightness.  Occurred with heavy exertion. -Nuclear medicine stress test normal.  She may have small vessel disease.  We will reduce her dose of losartan to 50 mg daily.  Add Imdur 30 mg daily.  She will continue metoprolol tartrate 50 mg twice daily. -Her symptoms could be related to worsening lung disease.  She has a severe diffusion defect.  She has  evidence of pulmonary hypertension.  Her oxygen level is 89% at rest.  I will reach out to her lung doctor to see if a walk test will be beneficial.  She may require oxygen. -Lipids are at goal.  She is on Plavix. -BP controlled. -Still smoking 1 pack/day.  We discussed quitting.  3. Primary hypertension -BP control.  Reducing losartan to add Imdur.  4. Tobacco abuse -3 minutes of smoking cessation counseling was provided in office today.  Disposition: Return in about 6 months (around 11/21/2022).  Medication Adjustments/Labs and Tests Ordered: Current medicines are reviewed at length with the patient today.  Concerns regarding medicines are outlined above.  No orders of the defined types were placed in this encounter.  Meds ordered this encounter  Medications   losartan (COZAAR) 50 MG tablet    Sig: Take 1 tablet (50 mg total) by mouth daily.    Dispense:  90 tablet    Refill:  3   isosorbide mononitrate (IMDUR) 30 MG 24 hr tablet    Sig: Take 1 tablet (30 mg total) by mouth daily.    Dispense:  90 tablet    Refill:  3    Patient Instructions  Medication Instructions:  Decrease Losartan 50 mg daily  START Imdur 30 mg daily   *If you need a refill on your cardiac medications before your next appointment, please call your pharmacy*   Follow-Up: At [facility], you and your health needs are our priority.  As part of our continuing mission to provide you with exceptional heart care, we have created designated Provider Care Teams.  These Care Teams include your primary Cardiologist (physician) and Advanced Practice Providers (APPs -  Physician Assistants and Nurse Practitioners) who all work together to provide you with the care you need, when you need it.  We recommend signing up for the patient portal called "MyChart".  Sign up information is provided on this After Visit Summary.  MyChart is used to connect with patients for Virtual Visits (Telemedicine).  Patients are  able to view lab/test results, encounter notes, upcoming appointments, etc.  Non-urgent messages can be sent to your provider as well.   To learn more about what you can do with MyChart, go to [email or web address].    Your next appointment:   6 month(s)  Provider:   Eleonore Chiquito, MD    Time Spent with Patient: I have spent a total of 25 minutes with patient reviewing hospital notes, telemetry, EKGs, labs and examining the patient as well as establishing an assessment and plan that was discussed with the patient.  > 50% of time was spent in direct patient care.  Signed, Addison Naegeli. Audie Box, MD, [facility]  [address], [place], [place] [address] [phone]  05/23/2022 8:35 AM

## 2022-05-23 ENCOUNTER — Encounter: Payer: Self-pay | Admitting: Cardiovascular Disease

## 2022-05-23 ENCOUNTER — Ambulatory Visit: Payer: Commercial Managed Care - HMO | Attending: Cardiovascular Disease | Admitting: Cardiovascular Disease

## 2022-05-23 ENCOUNTER — Other Ambulatory Visit: Payer: Self-pay

## 2022-05-23 VITALS — BP 110/78 | HR 65 | Ht 62.0 in | Wt 115.4 lb

## 2022-05-23 DIAGNOSIS — Z720 Tobacco use: Secondary | ICD-10-CM

## 2022-05-23 DIAGNOSIS — I25810 Atherosclerosis of coronary artery bypass graft(s) without angina pectoris: Secondary | ICD-10-CM

## 2022-05-23 DIAGNOSIS — E782 Mixed hyperlipidemia: Secondary | ICD-10-CM

## 2022-05-23 DIAGNOSIS — I10 Essential (primary) hypertension: Secondary | ICD-10-CM

## 2022-05-23 MED ORDER — ISOSORBIDE MONONITRATE ER 30 MG PO TB24
30.0000 mg | ORAL_TABLET | Freq: Every day | ORAL | 3 refills | Status: DC
  Filled 2022-05-23: qty 90, 90d supply, fill #0

## 2022-05-23 MED ORDER — LOSARTAN POTASSIUM 50 MG PO TABS
50.0000 mg | ORAL_TABLET | Freq: Every day | ORAL | 3 refills | Status: DC
  Filled 2022-05-23: qty 90, 90d supply, fill #0

## 2022-05-23 NOTE — Patient Instructions (Signed)
Medication Instructions:  Decrease Losartan 50 mg daily  START Imdur 30 mg daily   *If you need a refill on your cardiac medications before your next appointment, please call your pharmacy*   Follow-Up: At [facility], you and your health needs are our priority.  As part of our continuing mission to provide you with exceptional heart care, we have created designated Provider Care Teams.  These Care Teams include your primary Cardiologist (physician) and Advanced Practice Providers (APPs -  Physician Assistants and Nurse Practitioners) who all work together to provide you with the care you need, when you need it.  We recommend signing up for the patient portal called "MyChart".  Sign up information is provided on this After Visit Summary.  MyChart is used to connect with patients for Virtual Visits (Telemedicine).  Patients are able to view lab/test results, encounter notes, upcoming appointments, etc.  Non-urgent messages can be sent to your provider as well.   To learn more about what you can do with MyChart, go to [email or web address].    Your next appointment:   6 month(s)  Provider:   Eleonore Chiquito, MD

## 2022-05-24 ENCOUNTER — Telehealth: Payer: Self-pay | Admitting: Pulmonary Disease

## 2022-05-24 NOTE — Telephone Encounter (Signed)
Please schedule patient for 6 minute walk test. Her resting SpO2 was low normal at the cardiology clinic recently and she continues to have chest tightness with exertion.   Thanks, JD

## 2022-05-28 NOTE — Telephone Encounter (Signed)
Called and spoke with patient. I was able to get her scheduled for a 59m on 05/30/22 at 930am. She verbalized understanding.

## 2022-05-30 ENCOUNTER — Telehealth: Payer: Self-pay | Admitting: Pulmonary Disease

## 2022-05-30 ENCOUNTER — Ambulatory Visit: Payer: Commercial Managed Care - HMO

## 2022-05-30 NOTE — Telephone Encounter (Signed)
Pt calling bc she wants to get rescheduled for her 64mn walk test, pls advise

## 2022-05-30 NOTE — Telephone Encounter (Signed)
Will close encounter patient had 65m today

## 2022-05-31 ENCOUNTER — Other Ambulatory Visit: Payer: Self-pay

## 2022-05-31 MED ORDER — HYDROCODONE-ACETAMINOPHEN 5-325 MG PO TABS
1.0000 | ORAL_TABLET | Freq: Four times a day (QID) | ORAL | 0 refills | Status: DC | PRN
  Filled 2022-05-31: qty 24, 6d supply, fill #0

## 2022-05-31 NOTE — Telephone Encounter (Signed)
Please get patient re-scheduled for 6m. Last encounter was made in error

## 2022-06-05 ENCOUNTER — Ambulatory Visit (INDEPENDENT_AMBULATORY_CARE_PROVIDER_SITE_OTHER): Payer: Commercial Managed Care - HMO | Admitting: Pulmonary Disease

## 2022-06-05 ENCOUNTER — Encounter: Payer: Self-pay | Admitting: Pulmonary Disease

## 2022-06-05 DIAGNOSIS — Z87891 Personal history of nicotine dependence: Secondary | ICD-10-CM | POA: Diagnosis not present

## 2022-06-05 DIAGNOSIS — Z122 Encounter for screening for malignant neoplasm of respiratory organs: Secondary | ICD-10-CM

## 2022-06-05 NOTE — Progress Notes (Signed)
Virtual Visit via Telephone Note  I connected with Marie [name] on 06/05/22 at 11:30 AM EDT by telephone and verified that I am speaking with the correct person using two identifiers.  Location: Patient: HOME Provider: [place], [place] [address]     Shared Decision Making Visit Lung Cancer Screening Program [phone])   Eligibility: Age 57 y.o. Pack Years Smoking History Calculation 25 (# packs/per year x # years smoked) Recent History of coughing up blood  no Unexplained weight loss? No - is actively  ( >Than 15 pounds within the last 6 months ) Prior History Lung / other cancer no (Diagnosis within the last 5 years already requiring surveillance chest CT Scans). Smoking Status Current Smoker  Visit Components: Discussion included one or more decision making aids. yes Discussion included risk/benefits of screening. yes Discussion included potential follow up diagnostic testing for abnormal scans. yes Discussion included meaning and risk of over diagnosis. yes Discussion included meaning and risk of False Positives. yes Discussion included meaning of total radiation exposure. yes  Counseling Included: Importance of adherence to annual lung cancer LDCT screening. yes Impact of comorbidities on ability to participate in the program. yes Ability and willingness to under diagnostic treatment. yes  Smoking Cessation Counseling: Current Smokers:  Discussed importance of smoking cessation. yes Information about tobacco cessation classes and interventions provided to patient. yes Patient provided with "ticket" for LDCT Scan. yes Asymptomatic Patient yes  Counseling (Intermediate counseling: > three minutes counseling) [id number]   Smoking assessment and cessation counseling  Patient currently smoking: 0.5ppd  I have advised the patient to quit/stop smoking as soon as possible due to high risk for multiple medical problems.  It will also be very difficult  for [place] to manage patient's  respiratory symptoms and status if we continue to expose her lungs to a known irritant.  We do not advise e-cigarettes as a form of stopping smoking.  Patient is willing to consider stopping smoking.  I have advised the patient that we can assist and have options of nicotine replacement therapy, provided smoking cessation education today, provided smoking cessation counseling, and provided cessation resources.  Follow-up next office visit office visit for assessment of smoking cessation.    Smoking cessation counseling advised for: 5 min  CPT 99406 3 to 10 minutes CPT [id number] greater than 10 minutes   Written Order for Lung Cancer Screening with LDCT placed in Epic. Yes (CT Chest Lung Cancer Screening Low Dose W/O CM) [id number] Z12.2-Screening of respiratory organs Z87.891-Personal history of nicotine dependence   Lauraine Rinne, NP

## 2022-06-05 NOTE — Patient Instructions (Signed)
Thank you for participating in the [facility] Cancer Screening Program. It was our pleasure to meet you today. We will call you with the results of your scan within the next few days. Your scan will be assigned a Lung RADS category score by the physicians reading the scans.  This Lung RADS score determines follow up scanning.  See below for description of categories, and follow up screening recommendations. We will be in touch to schedule your follow up screening annually or based on recommendations of our providers. We will fax a copy of your scan results to your Primary Care Physician, or the physician who referred you to the program, to ensure they have the results. Please call the office if you have any questions or concerns regarding your scanning experience or results.  Our office number is [phone]. Please speak with Denise Phelps, RN. , or  Denise Buckner RN, They are  our Lung Cancer Screening RN.'s If They are unavailable when you call, Please leave a message on the voice mail. We will return your call at our earliest convenience.This voice mail is monitored several times a day.  Remember, if your scan is normal, we will scan you annually as long as you continue to meet the criteria for the program. (Age 50-80, Current smoker or smoker who has quit within the last 15 years). If you are a smoker, remember, quitting is the single most powerful action that you can take to decrease your risk of lung cancer and other pulmonary, breathing related problems. We know quitting is hard, and we are here to help.  Please let us know if there is anything we can do to help you meet your goal of quitting. If you are a former smoker, congratulations. We are proud of you! Remain smoke free! Remember you can refer friends or family members through the number above.  We will screen them to make sure they meet criteria for the program. Thank you for helping us take better care of you by  participating in Lung Screening.  You can receive free nicotine replacement therapy ( patches, gum or mints) by calling 1-800-QUIT NOW. Please call so we can get you on the path to becoming  a non-smoker. I know it is hard, but you can do this!  Lung RADS Categories:  Lung RADS 1: no nodules or definitely non-concerning nodules.  Recommendation is for a repeat annual scan in 12 months.  Lung RADS 2:  nodules that are non-concerning in appearance and behavior with a very low likelihood of becoming an active cancer. Recommendation is for a repeat annual scan in 12 months.  Lung RADS 3: nodules that are probably non-concerning , includes nodules with a low likelihood of becoming an active cancer.  Recommendation is for a 6-month repeat screening scan. Often noted after an upper respiratory illness. We will be in touch to make sure you have no questions, and to schedule your 6-month scan.  Lung RADS 4 A: nodules with concerning findings, recommendation is most often for a follow up scan in 3 months or additional testing based on our provider's assessment of the scan. We will be in touch to make sure you have no questions and to schedule the recommended 3 month follow up scan.  Lung RADS 4 B:  indicates findings that are concerning. We will be in touch with you to schedule additional diagnostic testing based on our provider's  assessment of the scan.  Other options for assistance in smoking cessation (   As covered by your insurance benefits)  Hypnosis for smoking cessation  [employer] [phone]  Acupuncture for smoking cessation  [facility] [phone]   

## 2022-06-06 ENCOUNTER — Ambulatory Visit
Admission: RE | Admit: 2022-06-06 | Discharge: 2022-06-06 | Disposition: A | Discharge status: Home / Self Care | Payer: Commercial Managed Care - HMO | Source: Ambulatory Visit | Attending: Acute Care | Admitting: Acute Care

## 2022-06-06 DIAGNOSIS — Z122 Encounter for screening for malignant neoplasm of respiratory organs: Secondary | ICD-10-CM

## 2022-06-06 DIAGNOSIS — Z87891 Personal history of nicotine dependence: Secondary | ICD-10-CM

## 2022-06-08 ENCOUNTER — Other Ambulatory Visit: Payer: Self-pay

## 2022-06-08 DIAGNOSIS — Z87891 Personal history of nicotine dependence: Secondary | ICD-10-CM

## 2022-06-08 DIAGNOSIS — F17210 Nicotine dependence, cigarettes, uncomplicated: Secondary | ICD-10-CM

## 2022-06-08 DIAGNOSIS — Z122 Encounter for screening for malignant neoplasm of respiratory organs: Secondary | ICD-10-CM

## 2022-06-10 ENCOUNTER — Encounter (INDEPENDENT_AMBULATORY_CARE_PROVIDER_SITE_OTHER): Payer: Commercial Managed Care - HMO

## 2022-06-10 DIAGNOSIS — J432 Centrilobular emphysema: Secondary | ICD-10-CM

## 2022-06-10 DIAGNOSIS — G4733 Obstructive sleep apnea (adult) (pediatric): Secondary | ICD-10-CM

## 2022-06-11 ENCOUNTER — Encounter: Payer: Self-pay | Admitting: Pulmonary Disease

## 2022-06-13 ENCOUNTER — Telehealth: Payer: Self-pay | Admitting: Pulmonary Disease

## 2022-06-13 DIAGNOSIS — G4733 Obstructive sleep apnea (adult) (pediatric): Secondary | ICD-10-CM

## 2022-06-13 NOTE — Telephone Encounter (Signed)
Called and spoke w/ pt she verbalized that her HST was done by [facility] and she can see the official results in [place]. I explained to pt that I am not trained to read the results but once a provider looks at them someone will give her a call.

## 2022-06-13 NOTE — Telephone Encounter (Signed)
Pt calling for sleep study results 

## 2022-06-14 ENCOUNTER — Encounter: Payer: Self-pay | Admitting: Pulmonary Disease

## 2022-06-14 NOTE — Telephone Encounter (Signed)
Please let patient know that she has moderate obstructive sleep apnea.   Recommend trial of CPAP therapy, auto titrating 5-15cmH2O with humidification. She will need mask fitting session for full facemask.   Please place orders if she is ok with start CPAP therapy.  Thanks, JD

## 2022-06-14 NOTE — Telephone Encounter (Signed)
ATC X1 LVM for patient to call the office back 

## 2022-06-15 NOTE — Telephone Encounter (Signed)
Discussed sleep results with patient. Order has been placed for CPAP machine and mask fit. Nothing further is needed.

## 2022-06-19 ENCOUNTER — Ambulatory Visit: Payer: Medicaid Other | Admitting: Cardiovascular Disease

## 2022-06-27 ENCOUNTER — Other Ambulatory Visit: Payer: Self-pay

## 2022-06-27 MED ORDER — ATORVASTATIN CALCIUM 40 MG PO TABS
40.0000 mg | ORAL_TABLET | Freq: Every day | ORAL | 0 refills | Status: DC
  Filled 2022-06-27: qty 90, 90d supply, fill #0

## 2022-06-27 MED ORDER — HYDROCODONE-ACETAMINOPHEN 5-325 MG PO TABS
1.0000 | ORAL_TABLET | Freq: Four times a day (QID) | ORAL | 0 refills | Status: DC | PRN
  Filled 2022-06-27: qty 24, 6d supply, fill #0

## 2022-06-27 MED ORDER — METOPROLOL TARTRATE 50 MG PO TABS
50.0000 mg | ORAL_TABLET | Freq: Two times a day (BID) | ORAL | 0 refills | Status: DC
  Filled 2022-06-27: qty 180, 90d supply, fill #0

## 2022-06-27 MED ORDER — HYDROCHLOROTHIAZIDE 25 MG PO TABS
25.0000 mg | ORAL_TABLET | Freq: Every day | ORAL | 0 refills | Status: DC
  Filled 2022-06-27: qty 90, 90d supply, fill #0

## 2022-06-27 MED ORDER — CLOPIDOGREL BISULFATE 75 MG PO TABS
75.0000 mg | ORAL_TABLET | Freq: Every day | ORAL | 0 refills | Status: DC
  Filled 2022-06-27: qty 90, 90d supply, fill #0

## 2022-06-28 ENCOUNTER — Other Ambulatory Visit: Payer: Self-pay

## 2022-07-04 ENCOUNTER — Other Ambulatory Visit (HOSPITAL_BASED_OUTPATIENT_CLINIC_OR_DEPARTMENT_OTHER): Payer: Commercial Managed Care - HMO

## 2022-07-05 ENCOUNTER — Other Ambulatory Visit: Payer: Self-pay

## 2022-07-05 MED ORDER — METOPROLOL TARTRATE 50 MG PO TABS
50.0000 mg | ORAL_TABLET | Freq: Every day | ORAL | 2 refills | Status: DC
  Filled 2022-07-05: qty 90, 90d supply, fill #0

## 2022-07-05 MED ORDER — HYDROCHLOROTHIAZIDE 25 MG PO TABS
25.0000 mg | ORAL_TABLET | Freq: Every day | ORAL | 2 refills | Status: DC
  Filled 2022-07-05: qty 90, 90d supply, fill #0

## 2022-07-05 MED ORDER — LOSARTAN POTASSIUM 50 MG PO TABS
50.0000 mg | ORAL_TABLET | Freq: Every day | ORAL | 2 refills | Status: DC
  Filled 2022-07-05: qty 90, 90d supply, fill #0

## 2022-07-05 MED ORDER — ATORVASTATIN CALCIUM 40 MG PO TABS
40.0000 mg | ORAL_TABLET | Freq: Every day | ORAL | 2 refills | Status: DC
  Filled 2022-07-05: qty 90, 90d supply, fill #0

## 2022-07-05 MED ORDER — METFORMIN HCL 500 MG PO TABS
500.0000 mg | ORAL_TABLET | Freq: Every day | ORAL | 2 refills | Status: DC
  Filled 2022-07-05: qty 90, 90d supply, fill #0

## 2022-07-05 MED ORDER — CLOPIDOGREL BISULFATE 75 MG PO TABS
75.0000 mg | ORAL_TABLET | Freq: Every day | ORAL | 2 refills | Status: DC
  Filled 2022-07-05: qty 90, 90d supply, fill #0

## 2022-07-06 ENCOUNTER — Other Ambulatory Visit: Payer: Self-pay

## 2022-07-10 ENCOUNTER — Other Ambulatory Visit: Payer: Self-pay

## 2022-07-25 ENCOUNTER — Other Ambulatory Visit: Payer: Self-pay

## 2022-07-25 MED ORDER — HYDROCODONE-ACETAMINOPHEN 5-325 MG PO TABS
1.0000 | ORAL_TABLET | Freq: Four times a day (QID) | ORAL | 0 refills | Status: DC | PRN
  Filled 2022-07-25: qty 24, 6d supply, fill #0

## 2022-08-22 ENCOUNTER — Other Ambulatory Visit: Payer: Self-pay

## 2022-08-22 MED ORDER — HYDROCODONE-ACETAMINOPHEN 5-325 MG PO TABS
1.0000 | ORAL_TABLET | Freq: Four times a day (QID) | ORAL | 0 refills | Status: DC | PRN
  Filled 2022-08-22: qty 24, 6d supply, fill #0

## 2022-09-19 ENCOUNTER — Other Ambulatory Visit: Payer: Self-pay

## 2022-09-19 MED ORDER — HYDROCODONE-ACETAMINOPHEN 5-325 MG PO TABS
1.0000 | ORAL_TABLET | Freq: Four times a day (QID) | ORAL | 0 refills | Status: DC | PRN
  Filled 2022-09-19: qty 24, 6d supply, fill #0

## 2022-10-12 ENCOUNTER — Other Ambulatory Visit: Payer: Self-pay

## 2022-10-12 MED ORDER — HYDROCODONE-ACETAMINOPHEN 5-325 MG PO TABS
1.0000 | ORAL_TABLET | Freq: Four times a day (QID) | ORAL | 0 refills | Status: DC | PRN
  Filled 2022-10-12: qty 24, 6d supply, fill #0

## 2022-11-09 ENCOUNTER — Other Ambulatory Visit: Payer: Self-pay

## 2022-11-09 MED ORDER — HYDROCODONE-ACETAMINOPHEN 5-325 MG PO TABS
1.0000 | ORAL_TABLET | Freq: Four times a day (QID) | ORAL | 0 refills | Status: DC | PRN
  Filled 2022-11-09: qty 24, 6d supply, fill #0

## 2022-11-09 NOTE — Progress Notes (Unsigned)
Cardiology Clinic Note   Date: 11/13/2022 ID: Lacree, Fierro [birth year], MRN [id number]  Primary Cardiologist:  Reatha Harps, MD  Patient Profile    Marie [name] is a 57 y.o. female who presents to the clinic today for follow up after medication changes.     Past medical history significant for: CAD. CABG x 3 2000. LHC 08/26/2009 (angina): PCI with DES to mid LCx. Nuclear stress test 11/06/2021: Low risk, normal study. Chronic diastolic heart failure. Echo 10/31/2021: EF 60 to 65%.  No RWMA.  Mild LVH basal-septal segment.  Normal diastolic parameters.  Normal RV function.  Mildly elevated PA pressure.  Moderate TR. Hypertension. Hyperlipidemia. Lipid panel 07/05/2022: LDL 79, HDL 64, TG 84, total 158. COPD. OSA. Sleep study March 2024 showed moderate OSA (followed by pulmonology). Not using CPAP as she has been unable to get a comfortable mask. Chronic pancreatitis. GERD. T2DM. Tobacco abuse. Emphysema.      History of Present Illness    Marie [name] has a history of early CAD.  She underwent CABG x 3 in 2000 at [facility] and PCI with DES to mid LCx in June 2011 at [facility].  She was lost to follow-up for number of years.  She was seen on 10/13/2021 to reestablish cardiac care at the request of Dr. Manus Gunning.  She reported dyspnea and chest tightness with exertion.  She had a normal normal echo and stress test.  Patient was last seen in the office by Dr. Flora Lipps on 05/23/2022 for routine follow-up.  She reported continued exertional chest tightness that improved with rest.  It was felt she could have small vessel disease and was given a trial of isosorbide.  SpO2 89% at rest.  Symptoms were felt to possibly be related to worsening lung disease.  Plan to reach out to pulmonology to determine if she would be appropriate for a walk test to see if she requires O2 supplementation.  At the time of her visit she was smoking 1 pack of cigarettes per day.  Today, patient reports  continued exertional chest tightness and shortness of breath. Chest tightness starts after she becomes dyspneic. She never started on isosorbide (she is uncertain why). She is being followed by pulmonology for her breathing issues and OSA. She has not been able to obtain a mask that works for her so she has not been using CPAP. She feels she needs supplemental O2 and plans on bringing this up at her next visit in September. She has decreased her smoking and is down to less than 1/2 pack a day.     ROS: All other systems reviewed and are otherwise negative except as noted in History of Present Illness.  Studies Reviewed      EKG is not ordered today.   Risk Assessment/Calculations      HYPERTENSION CONTROL Vitals:   11/13/22 1035 11/13/22 1244  BP: (!) 158/80 (!) 140/80    The patient's blood pressure is elevated above target today.  In order to address the patient's elevated BP: A new medication was prescribed today.           Physical Exam    VS:  BP (!) 158/80   Pulse 99   Ht 5\' 2"  (1.575 m)   Wt 103 lb 6.4 oz (46.9 kg)   SpO2 93%   BMI 18.91 kg/m  , BMI Body mass index is 18.91 kg/m.  GEN: Well nourished, well developed, in no acute distress. Neck: No JVD  or carotid bruits. Cardiac:  RRR. No murmurs. No rubs or gallops.   Respiratory:  Respirations regular and unlabored. Breath sounds diminished bilaterally without rales, wheezing or rhonchi. GI: Soft, nontender, nondistended. Extremities: Radials/DP/PT 2+ and equal bilaterally. No clubbing or cyanosis. No edema.  Skin: Warm and dry, no rash. Neuro: Strength intact.  Assessment & Plan    CAD/exertional chest tightness.  S/p CABG x 3 2000, PCI with DES to mid LCx June 2011.  Nuclear stress test August 2023 was a low risk normal study. Patient continues to complain of chest tightness that occurs with DOE. She never started isosorbide though she is unsure why stating that she was never given new medication and  pharmacy never called her to pick anything up.  No chest pain at rest.  Continue Plavix, atorvastatin, metoprolol. Will resend isosorbide 30 mg daily.  Hypertension: BP today 158/80 on intake and 140/80 on my recheck. Patient has not taken medications yet this morning. Patient denies headaches, dizziness or vision changes. Continue metoprolol, losartan. Hyperlipidemia. LDL April 2024 79. Continue atorvastatin.  Tobacco abuse. Patient has decreased smoking to <1/2 pack a day. She is congratulated on her effort and encouraged to continue cutting back to complete cessation.   Disposition: Isosorbide 30 mg daily. Return in 3 months or sooner as needed.          Signed, Etta Grandchild. Keayra Graham, DNP, NP-C

## 2022-11-13 ENCOUNTER — Encounter: Payer: Self-pay | Admitting: Student

## 2022-11-13 ENCOUNTER — Other Ambulatory Visit: Payer: Self-pay

## 2022-11-13 ENCOUNTER — Ambulatory Visit: Payer: Commercial Managed Care - HMO | Attending: Student | Admitting: Student

## 2022-11-13 VITALS — BP 140/80 | HR 99 | Ht 62.0 in | Wt 103.4 lb

## 2022-11-13 DIAGNOSIS — Z720 Tobacco use: Secondary | ICD-10-CM

## 2022-11-13 DIAGNOSIS — E785 Hyperlipidemia, unspecified: Secondary | ICD-10-CM | POA: Diagnosis not present

## 2022-11-13 DIAGNOSIS — I10 Essential (primary) hypertension: Secondary | ICD-10-CM | POA: Diagnosis not present

## 2022-11-13 DIAGNOSIS — I25810 Atherosclerosis of coronary artery bypass graft(s) without angina pectoris: Secondary | ICD-10-CM | POA: Diagnosis not present

## 2022-11-13 DIAGNOSIS — R079 Chest pain, unspecified: Secondary | ICD-10-CM

## 2022-11-13 MED ORDER — ISOSORBIDE MONONITRATE ER 30 MG PO TB24
30.0000 mg | ORAL_TABLET | Freq: Every day | ORAL | 1 refills | Status: DC
  Filled 2022-11-13: qty 30, 30d supply, fill #0

## 2022-11-13 NOTE — Patient Instructions (Addendum)
Medication Instructions:  Start Isosorbide 30mg  daily.  *If you need a refill on your cardiac medications before your next appointment, please call your pharmacy*   Lab Work: If you have labs (blood work) drawn today and your tests are completely normal, you will receive your results only by: MyChart Message (if you have MyChart) OR A paper copy in the mail If you have any lab test that is abnormal or we need to change your treatment, we will call you to review the results.   Follow-Up: At [facility], you and your health needs are our priority.  As part of our continuing mission to provide you with exceptional heart care, we have created designated Provider Care Teams.  These Care Teams include your primary Cardiologist (physician) and Advanced Practice Providers (APPs -  Physician Assistants and Nurse Practitioners) who all work together to provide you with the care you need, when you need it.  We recommend signing up for the patient portal called "MyChart".  Sign up information is provided on this After Visit Summary.  MyChart is used to connect with patients for Virtual Visits (Telemedicine).  Patients are able to view lab/test results, encounter notes, upcoming appointments, etc.  Non-urgent messages can be sent to your provider as well.   To learn more about what you can do with MyChart, go to [email or web address].    Your next appointment:   3 month(s)  Provider:   Reatha Harps, MD

## 2022-12-06 ENCOUNTER — Other Ambulatory Visit: Payer: Self-pay

## 2022-12-06 MED ORDER — HYDROCODONE-ACETAMINOPHEN 5-325 MG PO TABS
1.0000 | ORAL_TABLET | Freq: Four times a day (QID) | ORAL | 0 refills | Status: DC | PRN
  Filled 2022-12-06: qty 24, 6d supply, fill #0

## 2022-12-19 ENCOUNTER — Encounter: Payer: Self-pay | Admitting: Pulmonary Disease

## 2022-12-19 ENCOUNTER — Ambulatory Visit: Payer: Commercial Managed Care - HMO | Admitting: Pulmonary Disease

## 2022-12-19 VITALS — BP 136/84 | HR 83 | Ht 62.0 in | Wt 103.0 lb

## 2022-12-19 DIAGNOSIS — G4733 Obstructive sleep apnea (adult) (pediatric): Secondary | ICD-10-CM

## 2022-12-19 DIAGNOSIS — F17210 Nicotine dependence, cigarettes, uncomplicated: Secondary | ICD-10-CM

## 2022-12-19 MED ORDER — TRELEGY ELLIPTA 200-62.5-25 MCG/ACT IN AEPB: 1.0000 | INHALATION_SPRAY | Freq: Every day | RESPIRATORY_TRACT | Status: DC

## 2022-12-19 NOTE — Progress Notes (Signed)
Synopsis: Referred in April 2023 for Emphysema  Subjective:   PATIENT ID: Marie [name] GENDER: female DOB: Mar 03, 1966, MRN: [id number]  HPI  Chief Complaint  Patient presents with   Follow-up    Pt is here for Centrilobular Emphysema F/U visit. Pt complains of SOB and Cough with clear and yellow sputum.   Marie [name] is a 57 year old woman, daily smoker with history of sarcoidosis, HFpEF, OSA not on CPAP, hypertension and DM II who returns to pulmonary clinic for emphysema.    The patient, with a history of COPD and recent COVID-19 infection, reports worsening shortness of breath and cough. She describes feeling exhausted after physical exertion, such as picking up items or walking, often lagging behind others due to her breathlessness. She also reports a persistent cough with mucus production and occasional wheezing, although she does not consider the wheezing to be severe. The patient has been using a CPAP machine but feels it is not adequately managing her symptoms and believes she may need supplemental oxygen. She was previously on oxygen therapy following a hospital admission years ago. The patient is a current smoker and has reduced her daily intake to half a pack from a pack a day. She expresses a desire to quit smoking completely.  OV 04/23/22 She was recently admitted to [facility] 11/27 to 11/29 for acute hypoxemic respiratory failure due to covid 19 pneumonia and rectal bleeding. She initally required 3L of O2 and was successfully weaned off. She reports her shortness of breath continues to progress.  She has not had home sleep study yet.   She continues to smoke daily.   OV 10/16/21 She continues to smoke half a pack per day. She is using stiolto as needed and is taking it 1-2 times per week. She is not using albuterol.   PFTs today show moderate obstruction and severe diffusion defect.   Initial OV 07/12/21 She was admitted for covid 19 10/2020 and CT Chest from that time  showed mild centrilobular emphysema.   PFTs from 2015 showed moderate obstructive defect and severe diffusion defect.   She has dyspnea with exertion and has night time awakenings due to shortness of breath. She has history of sleep apnea and is not on CPAP due to insurance issues years ago. She is interested in having an updated sleep study to resume CPAP if needed. She has daily cough with sputum production in the mornings. She has intermittent wheezing. She denies issues with allergies. She has trouble with her breathing on hot humid days.   She is smoking half a pack per day. She has been smoking since age 62 and was previously smoking 1 pack per day. She works as a Chief Operating Officer. She denies any harmful dust or chemical exposures. She lives with her husband and dog.    Past Medical History:  Diagnosis Date   (HFpEF) heart failure with preserved ejection fraction (HCC)    a. 10/2018 EF >65% by SPECT.   Chronic pancreatitis (HCC)    Coronary atherosclerosis of unspecified type of vessel, native or graft    a. 09/2018 s/p CABG x 3; b. 07/2009 s/p PCI-->LCX; 10/2018 MV: Low risk w/ small/mild apical inf/lateral reversible defect - likley artifact. EF >65%.   Family history of pancreatic cancer    8/23 cancer genetic testing letter sent   Headache(784.0)    "weekly" (07/29/2013)   Hyperlipidemia LDL goal <70    Hypertension    Leiomyoma of uterus, unspecified  Loss of weight    Obesity, unspecified    OSA (obstructive sleep apnea)    no CPAP   Pulmonary sarcoidosis (HCC)    Pure hypercholesterolemia    Shortness of breath    none since stent 2 yrs ago   Type II diabetes mellitus (HCC)      Family History  Problem Relation Age of Onset   Diabetes Mother    Coronary artery disease Mother    Heart disease Mother    Hyperlipidemia Mother    Sudden death Mother    Pancreatic cancer Mother    Stroke Maternal Grandmother    Colon cancer Maternal Grandfather    Liver disease Maternal Uncle     Heart disease Maternal Uncle    Heart disease Paternal Aunt    Breast cancer Neg Hx      Social History   Socioeconomic History   Marital status: Married    Spouse name: Not on file   Number of children: Not on file   Years of education: Not on file   Highest education level: Not on file  Occupational History   Occupation: Airline pilot   Occupation: Private nursing aid  Tobacco Use   Smoking status: Every Day    Current packs/day: 0.50    Average packs/day: 0.5 packs/day for 32.0 years (16.0 ttl pk-yrs)    Types: Cigarettes   Smokeless tobacco: Never  Vaping Use   Vaping status: Never Used  Substance and Sexual Activity   Alcohol use: Yes    Alcohol/week: 24.0 standard drinks of alcohol    Types: 24 Glasses of wine per week    Comment: 08/28/2016 "3-4 glasses of wine qd; last glass was last night",   Drug use: No   Sexual activity: Not Currently    Birth control/protection: Surgical  Other Topics Concern   Not on file  Social History Narrative   Not on file   Social Determinants of Health   Financial Resource Strain: Not on file  Food Insecurity: No Food Insecurity (02/19/2022)   Hunger Vital Sign    Worried About Running Out of Food in the Last Year: Never true    Ran Out of Food in the Last Year: Never true  Transportation Needs: No Transportation Needs (02/19/2022)   PRAPARE - Administrator, Civil Service (Medical): No    Lack of Transportation (Non-Medical): No  Physical Activity: Not on file  Stress: Not on file  Social Connections: Not on file  Intimate Partner Violence: Not At Risk (02/19/2022)   Humiliation, Afraid, Rape, and Kick questionnaire    Fear of Current or Ex-Partner: No    Emotionally Abused: No    Physically Abused: No    Sexually Abused: No     Allergies  Allergen Reactions   Penicillins Other (See Comments)    Unknown    Lisinopril Hives    Other reaction(s): hives/angiod     Outpatient Medications Prior to Visit   Medication Sig Dispense Refill   atorvastatin (LIPITOR) 40 MG tablet 1 tablet Orally once a day for cholesterol 90 tablet 2   clopidogrel (PLAVIX) 75 MG tablet 1 tablet Orally Once a day to thin blood 90 tablet 2   Fluticasone-Umeclidin-Vilant (TRELEGY ELLIPTA) 100-62.5-25 MCG/ACT AEPB Inhale 1 puff into the lungs daily. 2 each 0   hydrochlorothiazide (HYDRODIURIL) 25 MG tablet Take 1 tablet (25 mg total) by mouth daily for blood pressure. 90 tablet 0   HYDROcodone-acetaminophen (NORCO/VICODIN) 5-325 MG tablet Take 1  tablet by mouth every 6 (six) hours as needed for pain. 24 tablet 0   HYDROcodone-acetaminophen (NORCO/VICODIN) 5-325 MG tablet Take 1 tablet by mouth every 6 (six) hours as needed. 24 tablet 0   isosorbide mononitrate (IMDUR) 30 MG 24 hr tablet Take 1 tablet (30 mg total) by mouth daily. 90 tablet 1   losartan (COZAAR) 50 MG tablet Take 1 tablet (50 mg total) by mouth daily. 90 tablet 3   metFORMIN (GLUCOPHAGE) 500 MG tablet 1 tablet with breakfast Orally Once a day for diabetes 90 tablet 2   metoprolol tartrate (LOPRESSOR) 50 MG tablet Take 1 tablet (50 mg total) by mouth 2 (two) times daily. 60 tablet 1   No facility-administered medications prior to visit.   Review of Systems  Constitutional:  Negative for chills, fever, malaise/fatigue and weight loss.  HENT:  Negative for congestion, sinus pain and sore throat.   Eyes: Negative.   Respiratory:  Positive for cough, sputum production, shortness of breath and wheezing. Negative for hemoptysis.   Cardiovascular:  Negative for chest pain, palpitations, orthopnea, claudication and leg swelling.  Gastrointestinal:  Negative for abdominal pain, heartburn, nausea and vomiting.  Genitourinary: Negative.   Musculoskeletal:  Negative for joint pain and myalgias.  Skin:  Negative for rash.  Neurological:  Negative for weakness.  Endo/Heme/Allergies: Negative.   Psychiatric/Behavioral: Negative.      Objective:   Vitals:    12/19/22 1007  BP: 136/84  Pulse: 83  SpO2: 93%  Weight: 103 lb (46.7 kg)  Height: 5\' 2"  (1.575 m)   Physical Exam Constitutional:      General: She is not in acute distress.    Appearance: She is not ill-appearing.  HENT:     Head: Normocephalic and atraumatic.  Eyes:     General: No scleral icterus. Cardiovascular:     Rate and Rhythm: Normal rate and regular rhythm.     Pulses: Normal pulses.     Heart sounds: Normal heart sounds. No murmur heard. Pulmonary:     Effort: Pulmonary effort is normal.     Breath sounds: Normal breath sounds. No wheezing, rhonchi or rales.  Musculoskeletal:     Right lower leg: No edema.     Left lower leg: No edema.  Skin:    General: Skin is warm and dry.  Neurological:     General: No focal deficit present.     Mental Status: She is alert.    CBC    Component Value Date/Time   WBC 6.0 02/21/2022 0607   RBC 4.53 02/21/2022 0607   HGB 13.9 02/21/2022 0607   HGB 15.6 10/22/2005 1110   HCT 44.1 02/21/2022 0607   HCT 47.0 (H) 10/22/2005 1110   PLT 192 02/21/2022 0607   PLT 219 10/22/2005 1110   MCV 97.4 02/21/2022 0607   MCV 94.9 02/24/2015 1405   MCV 88 10/22/2005 1110   MCH 30.7 02/21/2022 0607   MCHC 31.5 02/21/2022 0607   RDW 11.8 02/21/2022 0607   RDW 13.2 10/22/2005 1110   LYMPHSABS 0.8 02/18/2022 1300   LYMPHSABS 2.0 10/22/2005 1110   MONOABS 0.3 02/18/2022 1300   EOSABS 0.0 02/18/2022 1300   EOSABS 0.1 10/22/2005 1110   BASOSABS 0.0 02/18/2022 1300   BASOSABS 0.1 10/22/2005 1110      Latest Ref Rng & Units 02/21/2022    6:07 AM 02/20/2022    4:23 AM 02/19/2022    5:15 AM  BMP  Glucose 70 - 99 mg/dL 161  119  278   BUN 6 - 20 mg/dL 10  9  7    Creatinine 0.44 - 1.00 mg/dL 1.61  0.96  0.45   Sodium 135 - 145 mmol/L 137  138  138   Potassium 3.5 - 5.1 mmol/L 3.9  3.9  3.8   Chloride 98 - 111 mmol/L 102  109  104   CO2 22 - 32 mmol/L 27  25  25    Calcium 8.9 - 10.3 mg/dL 8.8  8.7  9.0    Chest imaging: CTA  Chest 11/10/20 1. No evidence of pulmonary embolism. 2. Moderate severity posterior bibasilar atelectasis and/or infiltrate. 3. Centrilobular emphysema with mild to moderate severity bilateral upper lobe linear scarring and/or atelectasis. 4. Stable area of patchy appearing focal scarring along the posterior aspect of the lung base. 5. Findings consistent with chronic pancreatitis.  HRCT Chest 2015 1. Since 03/23/2010, similar findings related to chronic pulmonary  sarcoidosis. Progression of concurrent centrilobular emphysema.  2. Similar calcified thoracic adenopathy, consistent with  sarcoidosis.  3. Interval development of chronic calcific pancreatitis,  incompletely imaged.  4. Markedly age advanced atherosclerosis, status post median  sternotomy.   PFT:    Latest Ref Rng & Units 10/16/2021   12:00 PM  PFT Results  FVC-Pre L 1.93   FVC-Predicted Pre % 61   FVC-Post L 1.93   FVC-Predicted Post % 61   Pre FEV1/FVC % % 68   Post FEV1/FCV % % 71   FEV1-Pre L 1.31   FEV1-Predicted Pre % 53   FEV1-Post L 1.37   DLCO uncorrected ml/min/mmHg 6.09   DLCO UNC% % 32   DLCO corrected ml/min/mmHg 6.09   DLCO COR %Predicted % 32   DLVA Predicted % 47   TLC L 3.73   TLC % Predicted % 79   RV % Predicted % 96   2012: Moderate obstruction and severe diffusion defect  Labs:  Path:  Echo:  Heart Catheterization:  Assessment & Plan:   OSA (obstructive sleep apnea) - Plan: Pulse oximetry, overnight  Discussion: Marie [name] is a 57 year old woman, daily smoker with history of sarcoidosis, HFpEF, OSA not on CPAP, hypertension and DM II who returns to pulmonary clinic for emphysema.   Chronic Obstructive Pulmonary Disease (COPD) Increased shortness of breath, cough, and mucus production. Patient reports difficulty with physical activity and believes she may need supplemental oxygen. Currently using CPAP and Trelegy Ellipta. -Order overnight oxygen test while patient uses  CPAP to assess for nocturnal hypoxia. -Performed walk test today, no O2 desaturation below 88% with ambulation -Increase Trelegy to 200 dose and provide sample for trial. -Consider nebulizer treatment if symptoms persist.  Tobacco Use Patient currently smoking half a pack per day, down from a pack per day. Acknowledges need to quit. -Encourage continued smoking cessation efforts. -Recommend use of 7mg  nicotine patch daily and 2mg  nicotine lozenges as needed for cravings.  General Health Maintenance -Encourage regular, moderate physical activity such as walking at a comfortable pace.  Follow up in 6 months  Melody Comas, MD [facility] Office: [phone]   Current Outpatient Medications:    atorvastatin (LIPITOR) 40 MG tablet, 1 tablet Orally once a day for cholesterol, Disp: 90 tablet, Rfl: 2   clopidogrel (PLAVIX) 75 MG tablet, 1 tablet Orally Once a day to thin blood, Disp: 90 tablet, Rfl: 2   Fluticasone-Umeclidin-Vilant (TRELEGY ELLIPTA) 100-62.5-25 MCG/ACT AEPB, Inhale 1 puff into the lungs daily., Disp: 2 each, Rfl: 0  hydrochlorothiazide (HYDRODIURIL) 25 MG tablet, Take 1 tablet (25 mg total) by mouth daily for blood pressure., Disp: 90 tablet, Rfl: 0   HYDROcodone-acetaminophen (NORCO/VICODIN) 5-325 MG tablet, Take 1 tablet by mouth every 6 (six) hours as needed for pain., Disp: 24 tablet, Rfl: 0   HYDROcodone-acetaminophen (NORCO/VICODIN) 5-325 MG tablet, Take 1 tablet by mouth every 6 (six) hours as needed., Disp: 24 tablet, Rfl: 0   isosorbide mononitrate (IMDUR) 30 MG 24 hr tablet, Take 1 tablet (30 mg total) by mouth daily., Disp: 90 tablet, Rfl: 1   losartan (COZAAR) 50 MG tablet, Take 1 tablet (50 mg total) by mouth daily., Disp: 90 tablet, Rfl: 3   metFORMIN (GLUCOPHAGE) 500 MG tablet, 1 tablet with breakfast Orally Once a day for diabetes, Disp: 90 tablet, Rfl: 2   metoprolol tartrate (LOPRESSOR) 50 MG tablet, Take 1 tablet (50 mg total) by  mouth 2 (two) times daily., Disp: 60 tablet, Rfl: 1

## 2022-12-19 NOTE — Patient Instructions (Signed)
We will check your oxygen levels on CPAP overnight  Try Trelegy ellipta 1 puff daily  Continue to use albuterol inhaler 1-2 puffs every 4-6 hours  Recommend smoking cessation with nicotine replacement therapy - use 7mg  nicotine patches daily - use 2mg  mini nicotine lozenges as needed  We will walk you today to see if you need oxygen when you are active  Follow up in 6 months

## 2023-01-02 ENCOUNTER — Other Ambulatory Visit: Payer: Self-pay

## 2023-01-02 MED ORDER — HYDROCODONE-ACETAMINOPHEN 5-325 MG PO TABS
1.0000 | ORAL_TABLET | Freq: Four times a day (QID) | ORAL | 0 refills | Status: DC | PRN
  Filled 2023-01-02: qty 24, 6d supply, fill #0

## 2023-01-30 ENCOUNTER — Other Ambulatory Visit: Payer: Self-pay

## 2023-01-31 ENCOUNTER — Other Ambulatory Visit: Payer: Self-pay

## 2023-02-01 ENCOUNTER — Other Ambulatory Visit: Payer: Self-pay

## 2023-02-01 MED ORDER — HYDROCODONE-ACETAMINOPHEN 5-325 MG PO TABS
1.0000 | ORAL_TABLET | Freq: Four times a day (QID) | ORAL | 0 refills | Status: DC
  Filled 2023-02-01 (×2): qty 5, 2d supply, fill #0

## 2023-02-06 ENCOUNTER — Other Ambulatory Visit: Payer: Self-pay

## 2023-02-06 MED ORDER — HYDROCODONE-ACETAMINOPHEN 5-325 MG PO TABS: 1.0000 | ORAL_TABLET | Freq: Four times a day (QID) | ORAL | 0 refills | Status: DC | PRN

## 2023-02-06 MED ORDER — LOSARTAN POTASSIUM 50 MG PO TABS
50.0000 mg | ORAL_TABLET | Freq: Every day | ORAL | 2 refills | Status: DC
  Filled 2023-02-06 (×2): qty 90, 90d supply, fill #0
  Filled 2024-01-06: qty 30, 30d supply, fill #1

## 2023-02-06 MED ORDER — ISOSORBIDE DINITRATE 30 MG PO TABS
30.0000 mg | ORAL_TABLET | Freq: Two times a day (BID) | ORAL | 11 refills | Status: DC
  Filled 2023-02-06: qty 60, 30d supply, fill #0

## 2023-02-06 MED ORDER — ATORVASTATIN CALCIUM 40 MG PO TABS
40.0000 mg | ORAL_TABLET | Freq: Every day | ORAL | 2 refills | Status: DC
  Filled 2023-02-06 (×2): qty 90, 90d supply, fill #0
  Filled 2024-01-06: qty 30, 30d supply, fill #1

## 2023-02-06 MED ORDER — CLOPIDOGREL BISULFATE 75 MG PO TABS
75.0000 mg | ORAL_TABLET | Freq: Every day | ORAL | 2 refills | Status: DC
  Filled 2023-02-06 (×2): qty 90, 90d supply, fill #0

## 2023-02-06 MED ORDER — HYDROCODONE-ACETAMINOPHEN 5-325 MG PO TABS
1.0000 | ORAL_TABLET | Freq: Four times a day (QID) | ORAL | 0 refills | Status: DC | PRN
  Filled 2023-02-06: qty 24, 30d supply, fill #0

## 2023-02-06 MED ORDER — METOPROLOL TARTRATE 50 MG PO TABS
50.0000 mg | ORAL_TABLET | Freq: Every day | ORAL | 2 refills | Status: DC
  Filled 2023-02-06 (×2): qty 90, 90d supply, fill #0
  Filled 2024-01-03: qty 30, 30d supply, fill #1

## 2023-02-06 MED ORDER — HYDROCHLOROTHIAZIDE 25 MG PO TABS
25.0000 mg | ORAL_TABLET | Freq: Every day | ORAL | 2 refills | Status: DC
  Filled 2023-02-06 (×2): qty 90, 90d supply, fill #0
  Filled 2024-01-03: qty 30, 30d supply, fill #1

## 2023-02-06 MED ORDER — METFORMIN HCL 500 MG PO TABS
500.0000 mg | ORAL_TABLET | Freq: Every day | ORAL | 2 refills | Status: DC
  Filled 2023-02-06 (×2): qty 90, 90d supply, fill #0

## 2023-02-24 NOTE — Progress Notes (Unsigned)
  Cardiology Office Note:  .   Date:  02/24/2023  ID:  Marie [name], DOB 02/28/1966, MRN [id number] PCP: Blair Heys, MD (Inactive)  [facility] Providers Cardiologist:  Reatha Harps, MD { Click to update primary MD,subspecialty MD or APP then REFRESH:1}   History of Present Illness: .   Marie [name] is a 57 y.o. female with history of CAD s/p CABG, DM, HTN, HLD, COPD, pHTN who presents for follow-up.      Problem List CAD s/p CABG -CABG x 3 09/1998 @ [facility]  -PCI LCX 07/2009 -NM stress 11/06/2021 nml 2. HTN 3. DM -A1c 6.1 4. HLD -T chol 139, HDL 61, LDL 63, TG 71 5. Pulmonary sarcoidosis  6. Etoh  7. Chronic pancreatitis  8. COPD/pHTN -moderate obstructive  -severe diffusion defect     ROS: All other ROS reviewed and negative. Pertinent positives noted in the HPI.     Studies Reviewed: Marland Kitchen       Physical Exam:   VS:  There were no vitals taken for this visit.   Wt Readings from Last 3 Encounters:  12/19/22 103 lb (46.7 kg)  11/13/22 103 lb 6.4 oz (46.9 kg)  05/23/22 115 lb 6.4 oz (52.3 kg)    GEN: Well nourished, well developed in no acute distress NECK: No JVD; No carotid bruits CARDIAC: ***RRR, no murmurs, rubs, gallops RESPIRATORY:  Clear to auscultation without rales, wheezing or rhonchi  ABDOMEN: Soft, non-tender, non-distended EXTREMITIES:  No edema; No deformity  ASSESSMENT AND PLAN: .   ***    {Are you ordering a CV Procedure (e.g. stress test, cath, DCCV, TEE, etc)?   Press F2        :[id number]}   Follow-up: No follow-ups on file.  Time Spent with Patient: I have spent a total of *** minutes caring for this patient today face to face, ordering and reviewing labs/tests, reviewing prior records/medical history, examining the patient, establishing an assessment and plan, communicating results/findings to the patient/family, and documenting in the medical record.   Signed, Lenna Gilford. Flora Lipps, MD, [facility]  [facility]  [address], [address] [place], [place] [address] [phone]  3:33 PM

## 2023-02-25 ENCOUNTER — Ambulatory Visit: Payer: Commercial Managed Care - HMO | Admitting: Cardiovascular Disease

## 2023-02-25 DIAGNOSIS — E785 Hyperlipidemia, unspecified: Secondary | ICD-10-CM

## 2023-02-25 DIAGNOSIS — I10 Essential (primary) hypertension: Secondary | ICD-10-CM

## 2023-02-25 DIAGNOSIS — I25810 Atherosclerosis of coronary artery bypass graft(s) without angina pectoris: Secondary | ICD-10-CM

## 2023-02-26 NOTE — Progress Notes (Unsigned)
Cardiology Office Note:  .   Date:  02/28/2023  ID:  Marie [name], DOB 1965/09/19, MRN [id number] PCP: Blair Heys, MD (Inactive)  [facility] Providers Cardiologist:  Reatha Harps, MD { History of Present Illness: .   Marie [name] is a 57 y.o. female with history of CAD, COPD, pHTN, DM, HTN who presents for follow-up.   History of Present Illness   Marie [name], a 57 year old with a history of CAD status post CABG/PCI, hypertension, diabetes, hyperlipidemia, and COPD, presents for follow-up. Despite a normal stress test last year, she continues to experience chest discomfort and shortness of breath with exertion. She reports that it doesn't take much exertion to trigger these symptoms. She also has worsening COPD, which she believes contributes to her symptoms. She admits to occasional smoking. She uses a CPAP machine for sleep apnea. Her blood pressure was noted to be elevated during the visit, and her cholesterol levels have increased. She also has diabetes, with a recent increase in her HbA1c to 6.9. LDL 79 which was at goal. Still smoking 2-3 cigs per day.          Problem List CAD s/p CABG -CABG x 3 09/1998 @ [facility]  -PCI LCX 07/2009 -NM stress 11/06/2021 nml 2. HTN 3. DM -A1c 6.9 4. HLD -T chol 158, HDL 64, LDL 79, TG 84 5. Pulmonary sarcoidosis  6. Etoh  7. Chronic pancreatitis  8. COPD/pHTN -moderate obstructive  -severe diffusion defect  9. OSA     ROS: All other ROS reviewed and negative. Pertinent positives noted in the HPI.     Studies Reviewed: Marland Kitchen       Physical Exam:   VS:  BP (!) 156/86 (BP Location: Left Arm, Patient Position: Sitting, Cuff Size: Normal)   Pulse 84   Ht 5\' 2"  (1.575 m)   Wt 106 lb (48.1 kg)   SpO2 93%   BMI 19.39 kg/m    Wt Readings from Last 3 Encounters:  02/28/23 106 lb (48.1 kg)  12/19/22 103 lb (46.7 kg)  11/13/22 103 lb 6.4 oz (46.9 kg)    GEN: Well nourished, well developed in no acute distress NECK: No  JVD; No carotid bruits CARDIAC: RRR, no murmurs, rubs, gallops RESPIRATORY:  Clear to auscultation without rales, wheezing or rhonchi  ABDOMEN: Soft, non-tender, non-distended EXTREMITIES:  No edema; No deformity  ASSESSMENT AND PLAN: .   Assessment and Plan    Coronary Artery Disease (CAD) status post Coronary Artery Bypass Graft (CABG) Exertional chest discomfort/SOB, likely related to COPD. Normal stress test last year. -Resume Imdur 30mg  daily. -Continue Plavix, Lipitor, Metoprolol Tartrate 50mg  BID.  Chronic Obstructive Pulmonary Disease (COPD) Tobacco abuse  Exertional shortness of breath and chest discomfort. Patient continues to smoke 1-2 cigarettes per day. -3 min smoking cessation counseling provided. -Continue follow-up with Pulmonologist.  Hypertension Blood pressure slightly elevated at 156/86. -Continue HCTZ 25mg  daily, Metoprolol Tartrate 50mg  BID, Losartan 50mg  daily. -Add Imdur 30mg  daily. -Monitor blood pressure closely.  Hyperlipidemia Cholesterol levels increased. -Continue Lipitor. -Return for fasting lipid panel in the next week. -may need repatha   Follow-up Return in 1 year unless changes occur.              Follow-up: Return in about 1 year (around 02/28/2024).  Time Spent with Patient: I have spent a total of 35 minutes caring for this patient today face to face, ordering and reviewing labs/tests, reviewing prior records/medical history, examining the patient, establishing an  assessment and plan, communicating results/findings to the patient/family, and documenting in the medical record.   Signed, Lenna Gilford. Flora Lipps, MD, [facility]  [facility]  [address], [address] [place], [place] [address] [phone]  10:24 AM

## 2023-02-28 ENCOUNTER — Encounter: Payer: Self-pay | Admitting: Cardiovascular Disease

## 2023-02-28 ENCOUNTER — Other Ambulatory Visit: Payer: Self-pay

## 2023-02-28 ENCOUNTER — Ambulatory Visit: Payer: Commercial Managed Care - HMO | Attending: Cardiovascular Disease | Admitting: Cardiovascular Disease

## 2023-02-28 VITALS — BP 156/86 | HR 84 | Ht 62.0 in | Wt 106.0 lb

## 2023-02-28 DIAGNOSIS — Z720 Tobacco use: Secondary | ICD-10-CM | POA: Diagnosis not present

## 2023-02-28 DIAGNOSIS — F17210 Nicotine dependence, cigarettes, uncomplicated: Secondary | ICD-10-CM | POA: Diagnosis not present

## 2023-02-28 DIAGNOSIS — E785 Hyperlipidemia, unspecified: Secondary | ICD-10-CM

## 2023-02-28 DIAGNOSIS — I25810 Atherosclerosis of coronary artery bypass graft(s) without angina pectoris: Secondary | ICD-10-CM

## 2023-02-28 DIAGNOSIS — I10 Essential (primary) hypertension: Secondary | ICD-10-CM | POA: Diagnosis not present

## 2023-02-28 MED ORDER — ISOSORBIDE MONONITRATE ER 30 MG PO TB24
30.0000 mg | ORAL_TABLET | Freq: Every day | ORAL | 1 refills | Status: DC
  Filled 2023-02-28: qty 90, 90d supply, fill #0
  Filled 2024-01-06: qty 30, 30d supply, fill #1

## 2023-02-28 NOTE — Patient Instructions (Signed)
Medication Instructions:  Your physician recommends that you continue on your current medications as directed. Please refer to the Current Medication list given to you today.    *If you need a refill on your cardiac medications before your next appointment, please call your pharmacy*   Lab Work: Your physician recommends that you return for lab work in 1 WEEK for: - FASTING LIPIDS     If you have labs (blood work) drawn today and your tests are completely normal, you will receive your results only by: MyChart Message (if you have MyChart) OR A paper copy in the mail If you have any lab test that is abnormal or we need to change your treatment, we will call you to review the results.   Testing/Procedures: NONE    Follow-Up: At [facility], you and your health needs are our priority.  As part of our continuing mission to provide you with exceptional heart care, we have created designated Provider Care Teams.  These Care Teams include your primary Cardiologist (physician) and Advanced Practice Providers (APPs -  Physician Assistants and Nurse Practitioners) who all work together to provide you with the care you need, when you need it.  We recommend signing up for the patient portal called "MyChart".  Sign up information is provided on this After Visit Summary.  MyChart is used to connect with patients for Virtual Visits (Telemedicine).  Patients are able to view lab/test results, encounter notes, upcoming appointments, etc.  Non-urgent messages can be sent to your provider as well.   To learn more about what you can do with MyChart, go to [email or web address].    Your next appointment:   1 year(s)  The format for your next appointment:   In Person  Provider:   Reatha Harps, MD    Other Instructions

## 2023-03-04 ENCOUNTER — Other Ambulatory Visit: Payer: Self-pay

## 2023-03-07 ENCOUNTER — Other Ambulatory Visit: Payer: Self-pay

## 2023-03-07 MED ORDER — HYDROCODONE-ACETAMINOPHEN 5-325 MG PO TABS
1.0000 | ORAL_TABLET | Freq: Four times a day (QID) | ORAL | 0 refills | Status: DC | PRN
  Filled 2023-03-07: qty 24, 30d supply, fill #0

## 2023-03-07 MED ORDER — HYDROCODONE-ACETAMINOPHEN 5-325 MG PO TABS
1.0000 | ORAL_TABLET | Freq: Four times a day (QID) | ORAL | 0 refills | Status: DC | PRN
  Filled 2023-04-05: qty 24, 6d supply, fill #0

## 2023-03-08 ENCOUNTER — Other Ambulatory Visit: Payer: Self-pay

## 2023-03-22 ENCOUNTER — Telehealth: Payer: Self-pay | Admitting: Pulmonary Disease

## 2023-03-22 DIAGNOSIS — G4733 Obstructive sleep apnea (adult) (pediatric): Secondary | ICD-10-CM

## 2023-03-22 NOTE — Telephone Encounter (Signed)
Patients ONO from 01/10/23 shows 2hrs less than 88% while on CPAP. Recommend she use 2L of oxygen with her CPAP machine. Please place supplemental oxygen orders, if ONO needs to be re-done please re-order.  Thanks, JD

## 2023-03-29 NOTE — Telephone Encounter (Addendum)
 I called and spoke with the pt and notified of response per Dr Francine Graven  She states willing to begin o2 with CPAP and repeat ONO  Orders placed

## 2023-04-02 ENCOUNTER — Other Ambulatory Visit: Payer: Self-pay

## 2023-04-02 NOTE — Telephone Encounter (Signed)
 Per [facility]:  [facility] said no per insurance requirements they have to show that they are desating on CPAP. So she needs the Titrated Sleep Study. Will you place the order ?    Dr. Francine Graven, please advise if you would like to order cpap titration, thanks!

## 2023-04-03 ENCOUNTER — Encounter: Payer: Self-pay | Admitting: Pulmonary Disease

## 2023-04-03 NOTE — Telephone Encounter (Signed)
CPAP titration order placed.  

## 2023-04-03 NOTE — Addendum Note (Signed)
 Addended by: Melody Comas on: 04/03/2023 06:11 PM   Modules accepted: Orders

## 2023-04-04 ENCOUNTER — Other Ambulatory Visit: Payer: Self-pay

## 2023-04-05 ENCOUNTER — Other Ambulatory Visit: Payer: Self-pay

## 2023-05-02 ENCOUNTER — Other Ambulatory Visit: Payer: Self-pay

## 2023-05-03 ENCOUNTER — Other Ambulatory Visit: Payer: Self-pay

## 2023-05-03 MED ORDER — HYDROCODONE-ACETAMINOPHEN 5-325 MG PO TABS
ORAL_TABLET | Freq: Four times a day (QID) | ORAL | 0 refills | Status: DC
  Filled 2023-05-03: qty 24, 6d supply, fill #0

## 2023-05-17 ENCOUNTER — Encounter: Payer: Self-pay | Admitting: Acute Care

## 2023-05-21 ENCOUNTER — Other Ambulatory Visit: Payer: Self-pay

## 2023-05-21 MED ORDER — HYDROCODONE-ACETAMINOPHEN 5-325 MG PO TABS
1.0000 | ORAL_TABLET | Freq: Four times a day (QID) | ORAL | 0 refills | Status: DC | PRN
  Filled 2023-06-17: qty 30, 8d supply, fill #0

## 2023-05-21 MED ORDER — HYDROCODONE-ACETAMINOPHEN 5-325 MG PO TABS
1.0000 | ORAL_TABLET | Freq: Four times a day (QID) | ORAL | 0 refills | Status: DC | PRN
  Filled 2023-05-21: qty 30, 8d supply, fill #0

## 2023-05-21 MED ORDER — HYDROCODONE-ACETAMINOPHEN 5-325 MG PO TABS
1.0000 | ORAL_TABLET | Freq: Four times a day (QID) | ORAL | 0 refills | Status: DC | PRN
  Filled 2023-07-17: qty 30, 8d supply, fill #0

## 2023-05-22 ENCOUNTER — Telehealth: Payer: Self-pay | Admitting: Pulmonary Disease

## 2023-05-22 NOTE — Telephone Encounter (Signed)
 ONO Results  Patient spent 47 minutes with SpO2 less than 88% on room air. She has been using CPAP at home. A CPAP titration study was ordered previously, please check on this order and have patient scheduled for CPAP titration to understand if she needs O2 used with her CPAP.  Thanks, JD

## 2023-05-27 NOTE — Telephone Encounter (Signed)
 Lm x1 for patient.

## 2023-05-29 NOTE — Telephone Encounter (Signed)
 Pt's cpap titration was scheduled for 3/13.

## 2023-05-29 NOTE — Telephone Encounter (Signed)
 I called and spoke to pt. I informed pt of Dr Lanora Manis note and pt verbalized understanding. Routing to [facility] to make sure the CPAP titration is scheduled. Order was placed on 04-03-23 by Dr Francine Graven. NFN

## 2023-05-30 NOTE — Telephone Encounter (Signed)
 ATC X1. Lmtcb.   Scheduled for [facility] Sleep disorders center on 06-06-23 at 8:00 pm. [facility]- [facility]

## 2023-05-30 NOTE — Telephone Encounter (Signed)
 Patient returned missed all. Notified her of appointment at sleep center.

## 2023-06-06 ENCOUNTER — Encounter (HOSPITAL_BASED_OUTPATIENT_CLINIC_OR_DEPARTMENT_OTHER): Payer: Commercial Managed Care - HMO | Admitting: Internal Medicine

## 2023-06-07 ENCOUNTER — Ambulatory Visit
Admission: RE | Admit: 2023-06-07 | Discharge: 2023-06-07 | Disposition: A | Discharge status: Home / Self Care | Payer: Commercial Managed Care - HMO | Source: Ambulatory Visit | Attending: Acute Care | Admitting: Acute Care

## 2023-06-07 DIAGNOSIS — F17210 Nicotine dependence, cigarettes, uncomplicated: Secondary | ICD-10-CM

## 2023-06-07 DIAGNOSIS — Z122 Encounter for screening for malignant neoplasm of respiratory organs: Secondary | ICD-10-CM

## 2023-06-07 DIAGNOSIS — Z87891 Personal history of nicotine dependence: Secondary | ICD-10-CM

## 2023-06-14 ENCOUNTER — Other Ambulatory Visit: Payer: Self-pay

## 2023-06-17 ENCOUNTER — Other Ambulatory Visit: Payer: Self-pay

## 2023-06-18 ENCOUNTER — Ambulatory Visit: Admitting: Obstetrics

## 2023-06-19 ENCOUNTER — Other Ambulatory Visit: Payer: Self-pay | Admitting: Family Medicine

## 2023-06-19 DIAGNOSIS — Z1231 Encounter for screening mammogram for malignant neoplasm of breast: Secondary | ICD-10-CM

## 2023-06-27 ENCOUNTER — Other Ambulatory Visit (HOSPITAL_COMMUNITY)
Admission: RE | Admit: 2023-06-27 | Discharge: 2023-06-27 | Disposition: A | Discharge status: Home / Self Care | Source: Ambulatory Visit | Attending: Obstetrics | Admitting: Obstetrics

## 2023-06-27 ENCOUNTER — Ambulatory Visit: Admitting: Obstetrics

## 2023-06-27 ENCOUNTER — Encounter: Payer: Self-pay | Admitting: Obstetrics

## 2023-06-27 VITALS — BP 121/78 | HR 90 | Ht 62.0 in | Wt 98.0 lb

## 2023-06-27 DIAGNOSIS — Z01419 Encounter for gynecological examination (general) (routine) without abnormal findings: Secondary | ICD-10-CM | POA: Diagnosis not present

## 2023-06-27 DIAGNOSIS — Z124 Encounter for screening for malignant neoplasm of cervix: Secondary | ICD-10-CM

## 2023-06-27 DIAGNOSIS — Z8742 Personal history of other diseases of the female genital tract: Secondary | ICD-10-CM

## 2023-06-27 DIAGNOSIS — Z0000 Encounter for general adult medical examination without abnormal findings: Secondary | ICD-10-CM

## 2023-06-27 NOTE — Progress Notes (Addendum)
 ANNUAL GYNECOLOGICAL EXAM  SUBJECTIVE  HPI  Marie [name] is a 58 y.o.-year-old [id number] who presents for an annual gynecological exam today.  She denies pelvic pain, abnormal vaginal bleeding or discharge, and UTI symptoms. She does report frequent incontinence and is interested in surgical management. She is not currently sexually active.  Medical/Surgical History Past Medical History:  Diagnosis Date   (HFpEF) heart failure with preserved ejection fraction (HCC)    a. 10/2018 EF >65% by SPECT.   Chronic pancreatitis (HCC)    Coronary atherosclerosis of unspecified type of vessel, native or graft    a. 09/2018 s/p CABG x 3; b. 07/2009 s/p PCI-->LCX; 10/2018 MV: Low risk w/ small/mild apical inf/lateral reversible defect - likley artifact. EF >65%.   Family history of pancreatic cancer    8/23 cancer genetic testing letter sent   Headache(784.0)    "weekly" (07/29/2013)   Hyperlipidemia LDL goal <70    Hypertension    Leiomyoma of uterus, unspecified    Loss of weight    Obesity, unspecified    OSA (obstructive sleep apnea)    no CPAP   Pulmonary sarcoidosis (HCC)    Pure hypercholesterolemia    Shortness of breath    none since stent 2 yrs ago   Type II diabetes mellitus (HCC)    Past Surgical History:  Procedure Laterality Date   BREAST CYST ASPIRATION     does not remember which breast   BREAST EXCISIONAL BIOPSY     does not remember which breast   CORONARY ANGIOPLASTY WITH STENT PLACEMENT  09/2009   "1"   CORONARY ARTERY BYPASS GRAFT  ~ 2000   CABG X3   HYSTEROSCOPY WITH NOVASURE N/A 09/04/2012   Procedure: HYSTEROSCOPY WITH NOVASURE;  Surgeon: Catalina Antigua, MD;  Location: [facility];  Service: Gynecology;  Laterality: N/A;  with removal intrauterine device   LEEP N/A 12/25/2018   Procedure: LOOP ELECTROSURGICAL EXCISION PROCEDURE (LEEP);  Surgeon: Nadara Mustard, MD;  Location: [facility] ORS;  Service: Gynecology;  Laterality: N/A;   ROBOTIC ASSISTED LAPAROSCOPIC  CHOLECYSTECTOMY-MULTI SITE N/A 01/14/2018   Procedure: ROBOTIC ASSISTED LAPAROSCOPIC CHOLECYSTECTOMY-MULTI SITE;  Surgeon: Leafy Ro, MD;  Location: [facility] ORS;  Service: General;  Laterality: N/A;   TUBAL LIGATION  1997    Social History Lives with husband. Feels safe there Work: Engineering geologist Exercise: none Substances: 7-8 glasses of wine/week. Smokes 3 packs/week, denies vape, and recreational drugs  Obstetric History OB History     Gravida  3   Para  3   Term  3   Preterm      AB      Living  3      SAB      IAB      Ectopic      Multiple      Live Births               GYN/Menstrual History No LMP recorded. Patient has had an ablation. Last Pap: Contraception: abstinence  Prevention Endorses regular dental and eye exams Mammogram: yearly Colonoscopy: up to date Flu shot/vaccines  Current Medications Outpatient Medications Prior to Visit  Medication Sig   atorvastatin (LIPITOR) 40 MG tablet Take 1 tablet (40 mg total) by mouth daily for cholesterol   clopidogrel (PLAVIX) 75 MG tablet Take 1 tablet (75 mg total) by mouth daily to thin blood   Fluticasone-Umeclidin-Vilant (TRELEGY ELLIPTA) 200-62.5-25 MCG/ACT AEPB Inhale 1 puff into the lungs daily.   hydrochlorothiazide (HYDRODIURIL)  25 MG tablet Take 1 tablet (25 mg total) by mouth daily for blood pressure   HYDROcodone-acetaminophen (NORCO/VICODIN) 5-325 MG tablet Take 1 tablet by mouth every 6 (six) hours as needed.   HYDROcodone-acetaminophen (NORCO/VICODIN) 5-325 MG tablet Take 1 tablet by mouth every 6 (six) hours as needed.   [START ON 07/16/2023] HYDROcodone-acetaminophen (NORCO/VICODIN) 5-325 MG tablet Take 1 tablet by mouth every 6 (six) hours as needed.   HYDROcodone-acetaminophen (NORCO/VICODIN) 5-325 MG tablet Take 1 tablet by mouth every 6 (six) hours as needed.   HYDROcodone-acetaminophen (NORCO/VICODIN) 5-325 MG tablet Take 1 tablet by mouth every 6 (six) hours as needed.    isosorbide mononitrate (IMDUR) 30 MG 24 hr tablet Take 1 tablet (30 mg total) by mouth daily.   losartan (COZAAR) 50 MG tablet Take 1 tablet (50 mg total) by mouth daily for blood pressure and diabetic kidney protection   metFORMIN (GLUCOPHAGE) 500 MG tablet Take 1 tablet (500 mg total) by mouth daily with breakfast for diabetes   metoprolol tartrate (LOPRESSOR) 50 MG tablet Take 1 tablet (50 mg total) by mouth daily with food for blood pressure and heart   No facility-administered medications prior to visit.      The pregnancy intention screening data noted above was reviewed. Potential methods of contraception were discussed. The patient elected to proceed with No data recorded.   ROS Constitutional: Denied constitutional symptoms, night sweats, recent illness, fatigue, fever, insomnia and weight loss.  Eyes: Denied eye symptoms, eye pain, photophobia, vision change and visual disturbance.  Ears/Nose/Throat/Neck: Denied ear, nose, throat or neck symptoms, hearing loss, nasal discharge, sinus congestion and sore throat.  Cardiovascular: Denied chest pain/pressure, edema, exercise intolerance, orthopnea. +SOB, palpitations  Respiratory: Denied pulmonary symptoms, asthma, pleuritic pain, productive sputum, cough, dyspnea and wheezing.  Gastrointestinal: Denied, gastro-esophageal reflux, melena, nausea and vomiting.  Genitourinary: Denied genitourinary symptoms including symptomatic vaginal discharge. +urinary incontinence  Musculoskeletal: Denied musculoskeletal symptoms, stiffness, swelling, muscle weakness and myalgia.  Dermatologic: Denied dermatology symptoms, rash and scar.  Neurologic: Denied neurology symptoms, dizziness, headache, neck pain and syncope.  Psychiatric: Denied psychiatric symptoms, anxiety and depression.  Endocrine: Denied endocrine symptoms including hot flashes and night sweats.    OBJECTIVE  Ht 5\' 2"  (1.575 m)   Wt 98 lb (44.5 kg)   BMI 17.92 kg/m    Physical  examination General NAD, Conversant  HEENT Atraumatic; Op clear with mmm.  Normo-cephalic. Pupils reactive. Anicteric sclerae  Thyroid/Neck Smooth without nodularity or enlargement. Normal ROM.  Neck Supple.  Skin No rashes, lesions or ulceration. Normal palpated skin turgor. No nodularity.  Breasts: No masses or discharge.  Symmetric.  No axillary adenopathy.  Lungs: Clear to auscultation.No rales or wheezes. Normal Respiratory effort, no retractions.  Heart: NSR.  No murmurs or rubs appreciated. No peripheral edema  Abdomen: Soft.  Non-tender.  No masses.  No HSM. No hernia  Extremities: Moves all appropriately.  Normal ROM for age. No lymphadenopathy.  Neuro: Oriented to PPT.  Normal mood. Normal affect.     Pelvic:   Vulva: Normal appearance.  No lesions.  Vagina: No lesions or abnormalities noted. Atrophy.  Urethra No masses tenderness or scarring.  Meatus Normal size without lesions or prolapse.  Cervix: Normal appearance.  No lesions.  Anus: Normal exam.  No lesions.  Perineum: Normal exam.  No lesions.    ASSESSMENT  1) Annual exam 2) Due for Pap 3) Incontinence - desires surgical management  PLAN 1) Physical exam as noted. Discussed healthy lifestyle  choices and preventive care. Encouraged smoking cessation. 2) Pap collected. F/u based on results. Discussed ASCCP recommendations. 3) Surgical consult with MD   Return in one year for annual exam or as needed for concerns.   Guadlupe Spanish, CNM

## 2023-07-01 ENCOUNTER — Encounter: Payer: Self-pay | Admitting: Obstetrics

## 2023-07-01 LAB — CYTOLOGY - PAP
Adequacy: ABSENT
Comment: NEGATIVE
Diagnosis: NEGATIVE
High risk HPV: NEGATIVE

## 2023-07-02 ENCOUNTER — Encounter: Payer: Self-pay | Admitting: Obstetrics and Gynecology

## 2023-07-02 ENCOUNTER — Ambulatory Visit (INDEPENDENT_AMBULATORY_CARE_PROVIDER_SITE_OTHER): Admitting: Obstetrics and Gynecology

## 2023-07-02 VITALS — BP 171/94 | HR 84 | Ht 62.0 in | Wt 100.2 lb

## 2023-07-02 DIAGNOSIS — N3946 Mixed incontinence: Secondary | ICD-10-CM | POA: Diagnosis not present

## 2023-07-02 DIAGNOSIS — N393 Stress incontinence (female) (male): Secondary | ICD-10-CM

## 2023-07-02 DIAGNOSIS — M6289 Other specified disorders of muscle: Secondary | ICD-10-CM

## 2023-07-02 NOTE — Progress Notes (Signed)
 HPI:      [name] is a 58 y.o. [phone] who LMP was No LMP recorded. Patient has had an ablation.  Subjective:   She presents today with complaint of worsening urine loss.  She states that approximately 10 to 15 years ago she had bladder surgery and she thinks that she had a sling at that time.  She was dry up until about 3 years ago and it has steadily gotten worse since then. Further questioning elicits the fact that she leaks with coughing laughing sneezing as well as having urge incontinence where she has to go immediately.  She also leaks large amounts with the urge incontinence. She states that she wants it fixed and does not want to try any medication at this time.    Hx: The following portions of the patient's history were reviewed and updated as appropriate:             She  has a past medical history of (HFpEF) heart failure with preserved ejection fraction (HCC), Chronic pancreatitis (HCC), Coronary atherosclerosis of unspecified type of vessel, native or graft, Family history of pancreatic cancer, Headache(784.0), Hyperlipidemia LDL goal <70, Hypertension, Leiomyoma of uterus, unspecified, Loss of weight, Obesity, unspecified, OSA (obstructive sleep apnea), Pulmonary sarcoidosis (HCC), Pure hypercholesterolemia, Shortness of breath, and Type II diabetes mellitus (HCC). She does not have any pertinent problems on file. She  has a past surgical history that includes Coronary artery bypass graft (~ 2000); Tubal ligation (1997); Hysteroscopy with novasure (N/A, 09/04/2012); Robotic assisted laparoscopic cholecystectomy-multi site (N/A, 01/14/2018); Coronary angioplasty with stent (09/2009); LEEP (N/A, 12/25/2018); Breast cyst aspiration; and Breast excisional biopsy. Her family history includes Colon cancer in her maternal grandfather; Coronary artery disease in her mother; Diabetes in her mother; Heart disease in her maternal uncle, mother, and paternal aunt; Hyperlipidemia in her  mother; Liver disease in her maternal uncle; Pancreatic cancer in her mother; Stroke in her maternal grandmother; Sudden death in her mother. She  reports that she has been smoking cigarettes. She has a 16 pack-year smoking history. She has never used smokeless tobacco. She reports current alcohol use of about 24.0 standard drinks of alcohol per week. She reports that she does not use drugs. She has a current medication list which includes the following prescription(s): atorvastatin, clopidogrel, trelegy ellipta, hydrochlorothiazide, hydrocodone-acetaminophen, hydrocodone-acetaminophen, [START ON 07/16/2023] hydrocodone-acetaminophen, hydrocodone-acetaminophen, hydrocodone-acetaminophen, losartan, metformin, metoprolol tartrate, and isosorbide mononitrate. She is allergic to penicillins and lisinopril.       Review of Systems:  Review of Systems  Constitutional: Denied constitutional symptoms, night sweats, recent illness, fatigue, fever, insomnia and weight loss.  Eyes: Denied eye symptoms, eye pain, photophobia, vision change and visual disturbance.  Ears/Nose/Throat/Neck: Denied ear, nose, throat or neck symptoms, hearing loss, nasal discharge, sinus congestion and sore throat.  Cardiovascular: Denied cardiovascular symptoms, arrhythmia, chest pain/pressure, edema, exercise intolerance, orthopnea and palpitations.  Respiratory: Denied pulmonary symptoms, asthma, pleuritic pain, productive sputum, cough, dyspnea and wheezing.  Gastrointestinal: Denied, gastro-esophageal reflux, melena, nausea and vomiting.  Genitourinary: See HPI for additional information.  Musculoskeletal: Denied musculoskeletal symptoms, stiffness, swelling, muscle weakness and myalgia.  Dermatologic: Denied dermatology symptoms, rash and scar.  Neurologic: Denied neurology symptoms, dizziness, headache, neck pain and syncope.  Psychiatric: Denied psychiatric symptoms, anxiety and depression.  Endocrine: Denied endocrine  symptoms including hot flashes and night sweats.   Meds:   Current Outpatient Medications on File Prior to Visit  Medication Sig Dispense Refill   atorvastatin (LIPITOR) 40 MG tablet  Take 1 tablet (40 mg total) by mouth daily for cholesterol 90 tablet 2   clopidogrel (PLAVIX) 75 MG tablet Take 1 tablet (75 mg total) by mouth daily to thin blood 90 tablet 2   Fluticasone-Umeclidin-Vilant (TRELEGY ELLIPTA) 200-62.5-25 MCG/ACT AEPB Inhale 1 puff into the lungs daily.     hydrochlorothiazide (HYDRODIURIL) 25 MG tablet Take 1 tablet (25 mg total) by mouth daily for blood pressure 90 tablet 2   HYDROcodone-acetaminophen (NORCO/VICODIN) 5-325 MG tablet Take 1 tablet by mouth every 6 (six) hours as needed. 24 tablet 0   HYDROcodone-acetaminophen (NORCO/VICODIN) 5-325 MG tablet Take 1 tablet by mouth every 6 (six) hours as needed. 24 tablet 0   [START ON 07/16/2023] HYDROcodone-acetaminophen (NORCO/VICODIN) 5-325 MG tablet Take 1 tablet by mouth every 6 (six) hours as needed. 30 tablet 0   HYDROcodone-acetaminophen (NORCO/VICODIN) 5-325 MG tablet Take 1 tablet by mouth every 6 (six) hours as needed. 30 tablet 0   HYDROcodone-acetaminophen (NORCO/VICODIN) 5-325 MG tablet Take 1 tablet by mouth every 6 (six) hours as needed. 30 tablet 0   losartan (COZAAR) 50 MG tablet Take 1 tablet (50 mg total) by mouth daily for blood pressure and diabetic kidney protection 90 tablet 2   metFORMIN (GLUCOPHAGE) 500 MG tablet Take 1 tablet (500 mg total) by mouth daily with breakfast for diabetes 90 tablet 2   metoprolol tartrate (LOPRESSOR) 50 MG tablet Take 1 tablet (50 mg total) by mouth daily with food for blood pressure and heart 90 tablet 2   isosorbide mononitrate (IMDUR) 30 MG 24 hr tablet Take 1 tablet (30 mg total) by mouth daily. 90 tablet 1   No current facility-administered medications on file prior to visit.      Objective:     Vitals:   07/02/23 0940 07/02/23 0948  BP: (!) 172/100 (!) 171/94   Pulse: 84    Filed Weights   07/02/23 0940  Weight: 100 lb 3.2 oz (45.5 kg)                        Assessment:    [id number] Patient Active Problem List   Diagnosis Date Noted   Gastrointestinal hemorrhage 04/13/2022   Alcohol use 04/13/2022   Acute respiratory failure with hypoxia (HCC) 02/18/2022   Gastritis 02/18/2022   Rectal bleeding 02/18/2022   Hypomagnesemia 07/21/2021   Pneumonia 07/20/2021   Transaminitis 07/20/2021   Acute on chronic diastolic CHF (congestive heart failure) (HCC) 07/20/2021   Pneumonia due to COVID-19 virus 11/11/2020   Personal history of cervical dysplasia 01/15/2019   CIN III (cervical intraepithelial neoplasia grade III) with severe dysplasia 10/23/2018   Atypical chest pain 09/11/2018   Shortness of breath 09/11/2018   CAD in native artery 09/11/2018   Hyperlipidemia LDL goal <70 09/11/2018   Acute on chronic pancreatitis (HCC) 01/20/2018   Calculus of gallbladder without cholecystitis without obstruction    Malnutrition of moderate degree 08/30/2016   SBO (small bowel obstruction) (HCC) 08/28/2016   History of coronary artery bypass graft 08/28/2016   COPD with chronic bronchitis (HCC) 08/28/2016   Essential hypertension 08/28/2016   Chronic pain 08/28/2016   ETOH abuse 08/28/2016   Abdominal pain    COPD (chronic obstructive pulmonary disease) (HCC) 08/13/2013   Hypoxemia 08/13/2013   Syncope 07/29/2013   Dehydration 07/29/2013   Alcohol abuse, continuous 08/11/2012   Upper abdominal pain 08/11/2012   Abnormal uterine bleeding 08/04/2012   Fibroid uterus 08/04/2012   Hypokalemia 03/24/2011  Acute pancreatitis 03/22/2011   BRONCHITIS, OBSTRUCTIVE CHRONIC 04/14/2010   Pulmonary sarcoidosis (HCC) 03/24/2010   WEIGHT LOSS 03/24/2010   FIBROIDS, UTERUS 08/24/2009   ACID REFLUX DISEASE 08/03/2009   HYPERCHOLESTEROLEMIA 02/22/2009   OBESITY 12/10/2008   Tobacco use 12/10/2008   Chronic pancreatitis (HCC) 12/10/2008   HEART  DISEASE 12/02/2008   OSA (obstructive sleep apnea) 12/02/2008   Diabetes mellitus with complication (HCC) 03/27/2003   Coronary atherosclerosis 09/25/1998     1. Mixed incontinence   2. Pelvic floor dysfunction     Based on her history she likely has mixed incontinence.  She has had previous bladder surgery and probably a sling procedure without hysterectomy. (Records are unavailable)    Plan:            1.  We discussed the option of medication for bladder dyssynergia/urge incontinence and she has declined this.  2.  Because of her prior sling surgery I have recommended she see a urogynecologist or urologist. She would like to be referred to [facility] urogyne[facility].  All of her questions were answered.  Orders Orders Placed This Encounter  Procedures   Ambulatory referral to Urogynecology    No orders of the defined types were placed in this encounter.     F/U  No follow-ups on file.  Elonda Husky, M.D. 07/02/2023 10:19 AM

## 2023-07-02 NOTE — Progress Notes (Signed)
 Patient presents today to discuss surgical options due to stress incontinence. She states this has been an ongoing issue for 3 years now, history of "bladder tact" 15 years ago. No additional concerns.

## 2023-07-03 ENCOUNTER — Ambulatory Visit
Admission: RE | Admit: 2023-07-03 | Discharge: 2023-07-03 | Disposition: A | Discharge status: Home / Self Care | Payer: Self-pay | Source: Ambulatory Visit | Attending: Family Medicine | Admitting: Family Medicine

## 2023-07-03 DIAGNOSIS — Z1231 Encounter for screening mammogram for malignant neoplasm of breast: Secondary | ICD-10-CM | POA: Diagnosis present

## 2023-07-17 ENCOUNTER — Other Ambulatory Visit: Payer: Self-pay

## 2023-07-19 ENCOUNTER — Other Ambulatory Visit: Payer: Self-pay | Admitting: Acute Care

## 2023-07-19 DIAGNOSIS — Z122 Encounter for screening for malignant neoplasm of respiratory organs: Secondary | ICD-10-CM

## 2023-07-19 DIAGNOSIS — F17210 Nicotine dependence, cigarettes, uncomplicated: Secondary | ICD-10-CM

## 2023-07-19 DIAGNOSIS — Z87891 Personal history of nicotine dependence: Secondary | ICD-10-CM

## 2023-08-01 ENCOUNTER — Ambulatory Visit (HOSPITAL_BASED_OUTPATIENT_CLINIC_OR_DEPARTMENT_OTHER): Attending: Pulmonary Disease | Admitting: Internal Medicine

## 2023-08-01 VITALS — Ht 62.0 in | Wt 103.0 lb

## 2023-08-01 DIAGNOSIS — G4733 Obstructive sleep apnea (adult) (pediatric): Secondary | ICD-10-CM | POA: Insufficient documentation

## 2023-08-11 DIAGNOSIS — G4733 Obstructive sleep apnea (adult) (pediatric): Secondary | ICD-10-CM | POA: Diagnosis not present

## 2023-08-11 NOTE — Procedures (Signed)
 [facility] [address] [place], [place] [address] Tel: [phone]   Fax: [phone]  Titration Interpretation  Patient Name:  Marie [name], Marie [name] Date:  08/01/2023 Referring Physician:  Duaine German, MD  Indications for Polysomnography The patient is a 58 year-old Female who is 5\' 2"  and weighs 103.0 lbs. Her BMI equals 19.0.  A full night titration treatment study was performed. Baseline diagnostic HST(SNAP) 05/31/22   AHI (3%) 21.5/hr, desaturation to 82%, body weight 114 lbs  Medication  No Data.   Polysomnogram Data A full night polysomnogram recorded the standard physiologic parameters including EEG, EOG, EMG, EKG, nasal and oral airflow.  Respiratory parameters of chest and abdominal movements were recorded with Respiratory Inductance Plethysmography belts.  Oxygen  saturation was recorded by pulse oximetry.   Sleep Architecture The total recording time of the polysomnogram was 395.4 minutes.  The total sleep time was 300.5 minutes.  The patient spent 17.3% of total sleep time in Stage N1, 69.4% in Stage N2, 0.0% in Stages N3, and 13.3% in REM.  Sleep latency was 10.3 minutes.  REM latency was 175.5 minutes.  Sleep Efficiency was 76.0%.  Wake after Sleep Onset time was 84.0 minutes.  Titration Summary The patient was titrated at pressures ranging from 8/4/0* cm/H20 with supplemental oxygen  at - up to 10/6/0* cm/H20 with supplemental oxygen  at -.  The last pressure used in the study was 10/6/0* cm/H20 with supplemental oxygen  at -.  Respiratory Events The polysomnogram revealed a presence of - obstructive, 1 central, and - mixed apneas resulting in an Apnea index of 0.2 events per hour.  There were 21 hypopneas (>=3% desaturation and/or arousal) resulting in an Apnea\Hypopnea Index (AHI >=3% desaturation and/or arousal) of 4.4 events per hour.  There were 7 hypopneas (>=4% desaturation) resulting in an Apnea\Hypopnea Index (AHI >=4%  desaturation) of 1.6 events per hour.  There were 4 Respiratory Effort Related Arousals resulting in a RERA index of 0.8 events per hour. The Respiratory Disturbance Index is 5.2 events per hour.  The snore index was - events per hour.  Mean oxygen  saturation was 92.0%.  The lowest oxygen  saturation during sleep was 87.0%.  Time spent <=88% oxygen  saturation was 10.2 minutes (2.6%).  Limb Activity There were - limb movements recorded.  Of this total, - were classified as PLMs.  Of the PLMs, - were associated with arousals.  The Limb Movement index was - per hour while the PLM index was - per hour.  Cardiac Summary The average pulse rate was 87.2 bpm.  The minimum pulse rate was 66.0 bpm while the maximum pulse rate was 104.0 bpm.  Cardiac rhythm was normal- occasional PVC.  Comment: CPAP was not tolerated at effective pressures and titration was completed with BILEVEL to 10/6, PS 0. Residual AHI 0/hr, desaturation to 89%, mean 92.6%.  Diagnosis: Obstructive sleep apnea  Recommendations: Suggest BiPAP 10/6, PS 0 or autoBiPAP.  Patient wore an [employer] AirFit F-10 full-face mask with heated humidity.   This study was personally reviewed and electronically signed by: [employer], MD Accredited Board Certified in Sleep Medicine Date/Time: 5/18.25    1:21   Titration Report  Patient Name: Marie [name], Marie [name] Study Date: 08/01/2023  Date of Birth: 1966/01/19 Study Type: CPAP/BIPAP Titration  Age: 58 year MRN #: [id number]  Sex: Female Interpreting Physician: [employer] [id number]  Height: 5\' 2"  Referring Physician: Duaine German, MD  Weight: 103.0 lbs Recording Tech: Odella Bending RPSGT RST  BMI: 19.0 Scoring Tech: Odella Bending RPSGT RST  ESS: 12 Neck Size: 11.5  Mask Type RESMED AIRFIT F20 Final Pressure: 10/6  Mask Size: SMALL Supplemental O2: N/A   Study Overview  Lights Off: 10:09:38 PM  Count Index  Lights On: 04:45:02 AM Awakenings: 39 7.8  Time in Bed: 395.4 min.  Arousals: 141 28.2  Total Sleep Time: 300.5 min. AHI (>=3% Desat and/or Ar.): 22 4.4   Sleep Efficiency: 76.0% AHI (>=4% Desat): 8 1.6   Sleep Latency: 10.3 min. Limb Movements: - -  Wake After Sleep Onset: 84.0 min. Snore: - -  REM Latency from Sleep Onset: 175.5 min. Desaturations: 42 8.4     Minimum SpO2 TST: 87.0%    Sleep Architecture  % of Time in Bed Stages Time (mins) % Sleep Time  Wake 95.0   Stage N1 52.0 17.3%  Stage N2 208.5 69.4%  Stage N3 0.0 0.0%  REM 40.0 13.3%   Arousal Summary   NREM REM Sleep Index  Respiratory Arousals 16 2 18  3.6  PLM Arousals - - - -  Isolated Limb Movement Arousals - - - -  Snore Arousals - - - -  Spontaneous Arousals 113 10 123 24.6  Total 129 12 141 28.2   Limb Movement Summary   Count Index  Isolated Limb Movements - -  Periodic Limb Movements (PLMs) - -  Total Limb Movements - -    Respiratory Summary   By Sleep Stage By Body Position Total   NREM REM Supine Non-Supine   Time (min) 260.5 40.0 - 300.5 300.5         Obstructive Apnea - - - - -  Mixed Apnea - - - - -  Central Apnea 1 - - 1 1  Total Apneas 1 - - 1 1  Total Apnea Index 0.2 - - 0.2 0.2         Hypopneas (>=3% Desat and/or Ar.) 19 2 - 21 21  AHI (>=3% Desat and/or Ar.) 4.6 3.0 - 4.4 4.4         Hypopneas (>=4% Desat) 6 1 - 7 7  AHI (>=4% Desat) 1.6 1.5 - 1.6 1.6          RERAs 4 - - 4 4  RERA Index 0.9 - - 0.8 0.8         RDI 5.5 3.0 - 5.2 5.2     Respiratory Event Durations   Apnea Hypopnea   NREM REM NREM REM  Average (seconds) 11.4 - 17.4 18.8  Maximum (seconds) 11.4 - 25.2 21.3    Oxygen  Saturation Summary   Wake NREM REM TST TIB  Average SpO2 92.1% 91.7% 93.6% 91.9% 92.0%  Minimum SpO2 85.0% 87.0% 88.0% 87.0% 85.0%  Maximum SpO2 98.0% 98.0% 98.0% 98.0% 98.0%   Oxygen  Saturation Distribution  Range (%) Time in range (min) Time in range (%)  90.0 - 100.0 310.8 79.9%  80.0 - 90.0 78.1 20.1%  70.0 - 80.0 - -  60.0 - 70.0 - -  50.0 -  60.0 - -  0.0 - 50.0 - -  Time Spent <=88% SpO2  Range (%) Time in range (min) Time in range (%)  0.0 - 88.0 10.2 2.6%      Count Index  Desaturations 42 8.4    Cardiac Summary   Wake NREM REM Sleep Total  Average Pulse Rate (BPM) 86.4 87.3 88.7 87.5 87.2  Minimum Pulse Rate (BPM) 66.0 70.0 72.0 70.0 66.0  Maximum Pulse  Rate (BPM) 104.0 98.0 99.0 99.0 104.0   Pulse Rate Distribution:  Range (bpm) Time in range (min) Time in range (%)  0.0 - 40.0 - -  40.0 - 60.0 - -  60.0 - 80.0 62.5 16.1%  80.0 - 100.0 324.2 83.2%  100.0 - 120.0 1.2 0.3%  120.0 - 140.0 - -  140.0 - 200.0 - -   Titration Summary  PAP Device PAP Level O2 Level Time (min) Wake (min) NREM (min) REM (min) Sleep Eff% OA# CA# MA# Hyp# (>=3%) AHI (>=3%) Hyp# (>=4%) AHI (>=%4) RERA RDI SpO2 <=88% (min) Min SpO2 Mean SpO2 Ar. Index  - Off - 0.5 0.5 0.0 0.0 0.0%               BiLevel 8/4/0 - 79.0 14.0 65.0 0.0 82.3% - 1 - 11 11.1 3  3.7 -  11.1  1.4 87.0 91.3 15.7  BiLevel 9/5/0 - 126.5 37.0 78.0 11.5 70.8% - - - 10 6.7 4  2.7 3  8.7  1.5 87.0 91.2 38.9  BiLevel 10/6/0 - 190.0 43.5 117.5 28.5 76.8% - - - - - -  - 1  0.4  0.0 89.0 92.6 27.1    Hypnograms                           Technologist Comments  THE 10-YEAR-OLD FEMALE PATIENT PRESENTED TO THE SLEEP DISORDER CENTER FOR A CPAP TITRATION STUDY WITH A CHIEF COMPLAINT OF OSA. THE PATIENT WAS FITTED WITH A X-SMALL RESMED AIRFIT F10 FULL FACE MASK FOR PRE-CPAP TRIAL, WHICH WAS TOLERATED WELL. NO BEDTIME MEDICATIONS WERE SELF ADMINISTERED. THE LEAD PLACEMENT WAS INITIATED, THEN THE STUDY WAS BEGUN. THE CPAP TRIAL WAS INITIATED AND SWITCHED TO BIPAP DUE TO CPAP INTOLERANCE WHICH CAUSED THE PATIENT TO HYPERVENTILATE.  DIFFERENT CPAP PRESSURES AND EPR LEVELS WERE TRIALED BEFORE THE SWITCH WAS MADE.  THE PATIENT WAS STARTED ON A PRESSURE OF 8/4cmH2O AND WAS TITRATED TO 10/6cmH2O WITH HEATED HUMIDIFICATION. THE CPAP PRESSURE WAS INCREASED FOR  RESPIRATORY EVENTS AND RERAs UNTIL OPTIMAL PRESSURE WAS ACHIEVED. NO PLMs - PLMAs WERE NOTED. NO SUPPLEMENTAL OXYGEN  WAS ADMINISTERED. ONE RESTROOM VISITS WAS MADE. QUESTIONABLE CARDIAC ARRHYTHMIAS WERE OBSERVED ON EPOCHS 68, 142, 378, AND THROUGHOUT THE STUDY. NO OBVIOUS PARASOMNIAs WERE OBSERVED. COUGHING WAS NOTED. SPONTANEOUS AROUSALS WERE NOTED. THE PATIENT TOLERATED THE BIPAP TRIAL WELL.                          [employer] Diplomate, Biomedical engineer of Sleep Medicine  ELECTRONICALLY SIGNED ON:  08/11/2023, 1:15 PM [facility] PH: [phone]   FX: [phone] ACCREDITED BY THE [employer]ICAN [employer]EMY OF SLEEP MEDICINE

## 2023-08-16 ENCOUNTER — Other Ambulatory Visit: Payer: Self-pay

## 2023-08-16 MED ORDER — HYDROCODONE-ACETAMINOPHEN 5-325 MG PO TABS
1.0000 | ORAL_TABLET | Freq: Four times a day (QID) | ORAL | 0 refills | Status: DC | PRN
  Filled 2023-08-16: qty 12, 3d supply, fill #0

## 2023-08-22 ENCOUNTER — Other Ambulatory Visit: Payer: Self-pay

## 2023-08-22 MED ORDER — HYDROCODONE-ACETAMINOPHEN 5-325 MG PO TABS
1.0000 | ORAL_TABLET | Freq: Four times a day (QID) | ORAL | 0 refills | Status: DC | PRN
  Filled 2023-09-18: qty 30, 8d supply, fill #0

## 2023-08-22 MED ORDER — HYDROCODONE-ACETAMINOPHEN 5-325 MG PO TABS
1.0000 | ORAL_TABLET | Freq: Four times a day (QID) | ORAL | 0 refills | Status: DC | PRN
  Filled 2023-10-16: qty 30, 8d supply, fill #0

## 2023-08-22 MED ORDER — CLOPIDOGREL BISULFATE 75 MG PO TABS
75.0000 mg | ORAL_TABLET | Freq: Every day | ORAL | 2 refills | Status: DC
  Filled 2023-08-22 (×2): qty 90, 90d supply, fill #0

## 2023-08-22 MED ORDER — HYDROCODONE-ACETAMINOPHEN 5-325 MG PO TABS
1.0000 | ORAL_TABLET | Freq: Four times a day (QID) | ORAL | 0 refills | Status: DC | PRN
  Filled 2023-08-22 (×2): qty 30, 8d supply, fill #0

## 2023-08-22 MED ORDER — METFORMIN HCL 500 MG PO TABS
500.0000 mg | ORAL_TABLET | Freq: Every day | ORAL | 2 refills | Status: DC
  Filled 2023-08-22 (×2): qty 90, 90d supply, fill #0

## 2023-08-22 MED ORDER — ISOSORBIDE DINITRATE 30 MG PO TABS
30.0000 mg | ORAL_TABLET | Freq: Two times a day (BID) | ORAL | 11 refills | Status: DC
  Filled 2023-08-22: qty 60, 30d supply, fill #0

## 2023-09-18 ENCOUNTER — Other Ambulatory Visit: Payer: Self-pay

## 2023-10-15 ENCOUNTER — Other Ambulatory Visit: Payer: Self-pay

## 2023-10-16 ENCOUNTER — Other Ambulatory Visit: Payer: Self-pay

## 2023-10-24 ENCOUNTER — Other Ambulatory Visit: Payer: Self-pay

## 2023-10-24 ENCOUNTER — Ambulatory Visit: Payer: Self-pay | Admitting: Obstetrics

## 2023-10-24 ENCOUNTER — Other Ambulatory Visit (HOSPITAL_COMMUNITY)
Admission: RE | Admit: 2023-10-24 | Discharge: 2023-10-24 | Disposition: A | Discharge status: Home / Self Care | Payer: Self-pay | Source: Other Acute Inpatient Hospital | Attending: Obstetrics | Admitting: Obstetrics

## 2023-10-24 ENCOUNTER — Encounter: Payer: Self-pay | Admitting: Obstetrics

## 2023-10-24 VITALS — BP 163/94 | HR 79

## 2023-10-24 DIAGNOSIS — N952 Postmenopausal atrophic vaginitis: Secondary | ICD-10-CM

## 2023-10-24 DIAGNOSIS — R319 Hematuria, unspecified: Secondary | ICD-10-CM

## 2023-10-24 DIAGNOSIS — R82998 Other abnormal findings in urine: Secondary | ICD-10-CM | POA: Insufficient documentation

## 2023-10-24 DIAGNOSIS — N3946 Mixed incontinence: Secondary | ICD-10-CM

## 2023-10-24 DIAGNOSIS — Z720 Tobacco use: Secondary | ICD-10-CM

## 2023-10-24 DIAGNOSIS — R8290 Unspecified abnormal findings in urine: Secondary | ICD-10-CM | POA: Insufficient documentation

## 2023-10-24 DIAGNOSIS — R159 Full incontinence of feces: Secondary | ICD-10-CM

## 2023-10-24 DIAGNOSIS — R351 Nocturia: Secondary | ICD-10-CM

## 2023-10-24 LAB — URINALYSIS, ROUTINE W REFLEX MICROSCOPIC
Bilirubin Urine: NEGATIVE
Glucose, UA: NEGATIVE mg/dL
Ketones, ur: NEGATIVE mg/dL
Nitrite: POSITIVE — AB
Protein, ur: 30 mg/dL — AB
Specific Gravity, Urine: 1.016 (ref 1.005–1.030)
WBC, UA: >50 WBC/hpf (ref 0–5)
pH: 5 (ref 5.0–8.0)

## 2023-10-24 LAB — POCT URINALYSIS DIP (CLINITEK)
Bilirubin, UA: NEGATIVE
Glucose, UA: NEGATIVE mg/dL
Ketones, POC UA: NEGATIVE mg/dL
Nitrite, UA: POSITIVE — AB
Spec Grav, UA: 1.02 (ref 1.010–1.025)
Urobilinogen, UA: 0.2 U/dL
pH, UA: 5.5 (ref 5.0–8.0)

## 2023-10-24 MED ORDER — GEMTESA 75 MG PO TABS
75.0000 mg | ORAL_TABLET | Freq: Every day | ORAL | Status: DC
Start: 2023-10-24 — End: 2023-10-24

## 2023-10-24 MED ORDER — ESTRADIOL 0.1 MG/GM VA CREA
TOPICAL_CREAM | VAGINAL | 3 refills | Status: DC
  Filled 2023-10-24: qty 42.5, 90d supply, fill #0

## 2023-10-24 MED ORDER — GEMTESA 75 MG PO TABS
75.0000 mg | ORAL_TABLET | Freq: Every day | ORAL | 2 refills | Status: DC
  Filled 2023-10-24: qty 30, 30d supply, fill #0

## 2023-10-24 MED ORDER — TROSPIUM CHLORIDE ER 60 MG PO CP24
1.0000 | ORAL_CAPSULE | Freq: Every day | ORAL | 2 refills | Status: AC
Start: 2023-10-24 — End: ?
  Filled 2023-10-24: qty 30, 30d supply, fill #0

## 2023-10-24 NOTE — Assessment & Plan Note (Signed)
-   POCT UA + leuk/heme, pending UA microscopy

## 2023-10-24 NOTE — Assessment & Plan Note (Signed)
-   encouraged tobacco cessation due to CHF, h/o CABG - discussed association with cough and stress urinary leakage

## 2023-10-24 NOTE — Assessment & Plan Note (Addendum)
-   POCT UA + heme, pending UA microscopy. PVR 13mL - prior bladder tack with resolution of urinary leakage > 15 years ago by Dr. Eveline - We discussed the symptoms of overactive bladder (OAB), which include urinary urgency, urinary frequency, nocturia, with or without urge incontinence.  While we do not know the exact etiology of OAB, several treatment options exist. We discussed management including behavioral therapy (decreasing bladder irritants, urge suppression strategies, timed voids, bladder retraining), physical therapy, medication; for refractory cases posterior tibial nerve stimulation, sacral neuromodulation, and intravesical botulinum toxin injection.  For anticholinergic medications, we discussed the potential side effects of anticholinergics including dry eyes, dry mouth, constipation, cognitive impairment and urinary retention. For Beta-3 agonist medication, we discussed the potential side effect of elevated blood pressure which is more likely to occur in individuals with uncontrolled hypertension. - trial of gemtesa  with samples provided. Rx changed to Trospium  due to cost - encouraged Kegel exercises - For treatment of stress urinary incontinence,  non-surgical options include expectant management, weight loss, physical therapy, as well as a pessary.  Surgical options include a midurethral sling, Burch urethropexy, and transurethral injection of a bulking agent. - trial of low dose vaginal estrogen - scheduled urodynamics due to history of bladder tack with possible midurethral sling

## 2023-10-24 NOTE — Assessment & Plan Note (Signed)
-   For symptomatic vaginal atrophy options include lubrication with a water-based lubricant, personal hygiene measures and barrier protection against wetness, and estrogen replacement in the form of vaginal cream, vaginal tablets, or a time-released vaginal ring.   - Rx low dose vaginal estrogen

## 2023-10-24 NOTE — Assessment & Plan Note (Addendum)
-   h/o chronic pancreatitis with EtOH use, metformin  and opioid use, h/o SBO - unintended weight loss, blood in stool, tobacco use - Bristol VI stool - Treatment options include anti-diarrhea medication (loperamide/ Imodium OTC or prescription lomotil), fiber supplements, physical therapy, and possible sacral neuromodulation or surgery.   - encouraged fiber supplementation and follow-up with GI for workup to r/o malignancy - trial of Trospium   - encouraged Kegel exercises

## 2023-10-24 NOTE — Assessment & Plan Note (Signed)
-   avoid fluid intake 3 hours before bedtime - switch diuretic (e.g. HCTZ) dosing to 2pm - continue CPAP for sleep apnea - possibly to CHF

## 2023-10-24 NOTE — Progress Notes (Signed)
 New Patient Evaluation and Consultation  Referring Provider: Janit Alm Agent, MD PCP: Freddrick, No Date of Service: 10/24/2023  SUBJECTIVE Chief Complaint: Urinary Incontinence (Has bladder tack at [place] many many years ago/)  History of Present Illness: Marie [name] is a 58 y.o. Black or African-American female seen in consultation at the request of Dr Janit for evaluation of urinary incontinence.    Urinary leakage with activity started around 2-3 years ago Reports bladder tack > 15 years ago by Dr. Eveline due to urinary leakage and lower abdominal pain Denies vaginal estrogen, PT, or pessary use.  Unintentional weight loss due to loss of appetite with chronic pancreatitis and blood in stool Tried medications for urinary leakage in the past without relief S/p Novasure endometrial ablation 09/04/12 by Dr. Rutherford  Review of records significant for: CAD with history of CHF and s/p CABG on Plavix , tobacco use with emphysema, sarcoidosis, chronic pancreatitis with EtOH use with 7-8 glasses of wine/week, T2DM, h/o SBO  Urinary Symptoms: Leaks urine with cough/ sneeze, with a full bladder, and with movement to the bathroom Leaks 4-5 time(s) per days with activity Leaks 3x/week with urgency Pad use: 3-4 pads per day.   Patient is bothered by UI symptoms.  Day time voids 6.  Nocturia: 2-3 times per night to void. Reports snoring and uses CPAP machine Denies leg swelling Stop drinking around 8pm, sleeps 8:30pm Voiding dysfunction:  does not empty bladder well.  Patient does not use a catheter to empty bladder.  When urinating, patient feels dribbling after finishing and the need to urinate multiple times in a row Drinks: 32oz water per day, 8oz coffee, 8oz apple juice  UTIs: 0 UTI's in the last year.   Denies history of blood in urine, kidney or bladder stones, pyelonephritis, bladder cancer, and kidney cancer No results found for the last 90 days.   Pelvic Organ Prolapse  Symptoms:                  Patient Denies a feeling of a bulge the vaginal area.   Bowel Symptom: Bowel movements: 1 time(s) per day Stool consistency: soft , Bristol VI mostly, sometime type IV stool  Straining: yes.  Splinting: no.  Incomplete evacuation: no.  Patient Admits to accidental bowel leakage / fecal incontinence started 15 years ago  Occurs: 2 time(s) per week  Consistency with leakage: liquid Reports blood in stool  Bowel regimen: none Last colonoscopy: Date 2024 per pt, Results not available for review  [facility] Colonoscopy          Current Care Gaps     Colonoscopy (Every 10 Years) Never done   No completion history exists for this topic.                 Sexual Function Sexually active: no.  Sexual orientation: Straight Pain with sex: No  Pelvic Pain Denies pelvic pain   Past Medical History:  Past Medical History:  Diagnosis Date   (HFpEF) heart failure with preserved ejection fraction (HCC)    a. 10/2018 EF >65% by SPECT.   Chronic pancreatitis (HCC)    Coronary atherosclerosis of unspecified type of vessel, native or graft    a. 09/2018 s/p CABG x 3; b. 07/2009 s/p PCI-->LCX; 10/2018 MV: Low risk w/ small/mild apical inf/lateral reversible defect - likley artifact. EF >65%.   Family history of pancreatic cancer    8/23 cancer genetic testing letter sent   Headache(784.0)    weekly (07/29/2013)  Hyperlipidemia LDL goal <70    Hypertension    Leiomyoma of uterus, unspecified    Loss of weight    Obesity, unspecified    OSA (obstructive sleep apnea)    no CPAP   Pulmonary sarcoidosis (HCC)    Pure hypercholesterolemia    Shortness of breath    none since stent 2 yrs ago   Type II diabetes mellitus (HCC)      Past Surgical History:   Past Surgical History:  Procedure Laterality Date   BREAST CYST ASPIRATION     does not remember which breast   BREAST EXCISIONAL BIOPSY     does not remember which breast   CORONARY ANGIOPLASTY WITH STENT  PLACEMENT  09/2009   1   CORONARY ARTERY BYPASS GRAFT  ~ 2000   CABG X3   HYSTEROSCOPY WITH NOVASURE N/A 09/04/2012   Procedure: HYSTEROSCOPY WITH NOVASURE;  Surgeon: Winton Felt, MD;  Location: [facility];  Service: Gynecology;  Laterality: N/A;  with removal intrauterine device   LEEP N/A 12/25/2018   Procedure: LOOP ELECTROSURGICAL EXCISION PROCEDURE (LEEP);  Surgeon: Arloa Lamar SQUIBB, MD;  Location: [facility];  Service: Gynecology;  Laterality: N/A;   ROBOTIC ASSISTED LAPAROSCOPIC CHOLECYSTECTOMY-MULTI SITE N/A 01/14/2018   Procedure: ROBOTIC ASSISTED LAPAROSCOPIC CHOLECYSTECTOMY-MULTI SITE;  Surgeon: Jordis Laneta FALCON, MD;  Location: [facility];  Service: General;  Laterality: N/A;   TUBAL LIGATION  1997     Past OB/GYN History: OB History  Gravida Para Term Preterm AB Living  3 3 2 1  3   SAB IAB Ectopic Multiple Live Births          # Outcome Date GA Lbr Len/2nd Weight Sex Type Anes PTL Lv  3 Preterm           2 Term           1 Term             Obstetric Comments  8lbs 9oz  1lbs 12oz - 34 weeks   8lbs 7oz     Vaginal deliveries: largest infant 8lb9oz,  Forceps/ Vacuum deliveries: 0, Cesarean section: 0 Menopausal: Yes, at age started around early 62s, Denies vaginal bleeding since menopause Contraception: BTL. Last pap smear.  Any history of abnormal pap smears: yes with history of LEEP for CIN III    Component Value Date/Time   DIAGPAP  06/27/2023 0852    - Negative for intraepithelial lesion or malignancy (NILM)   DIAGPAP  10/10/2021 9160    - Negative for intraepithelial lesion or malignancy (NILM)   DIAGPAP (A) 10/07/2017 0000    ATYPICAL SQUAMOUS CELLS OF UNDETERMINED SIGNIFICANCE (ASC-US ).   HPVHIGH Negative 06/27/2023 0852   HPVHIGH Negative 10/10/2021 0839   ADEQPAP  06/27/2023 0852    Satisfactory for evaluation; transformation zone component ABSENT.   ADEQPAP  10/10/2021 0839    Satisfactory for evaluation; transformation zone component ABSENT.   ADEQPAP (A)  10/07/2017 0000    Satisfactory for evaluation  endocervical/transformation zone component PRESENT.    Medications: Patient has a current medication list which includes the following prescription(s): atorvastatin , clopidogrel , clopidogrel , [START ON 10/28/2023] estradiol , trelegy ellipta , hydrochlorothiazide , hydrocodone -acetaminophen , hydrocodone -acetaminophen , hydrocodone -acetaminophen , hydrocodone -acetaminophen , hydrocodone -acetaminophen , hydrocodone -acetaminophen , hydrocodone -acetaminophen , isosorbide  dinitrate, isosorbide  mononitrate, losartan , metformin , metformin , metoprolol  tartrate, and trospium  chloride.   Allergies: Patient is allergic to penicillins and lisinopril .   Social History:  Social History   Tobacco Use   Smoking status: Every Day    Current packs/day: 0.50    Average packs/day: 0.5 packs/day for  32.0 years (16.0 ttl pk-yrs)    Types: Cigarettes   Smokeless tobacco: Never   Tobacco comments:    Ready to quit  Vaping Use   Vaping status: Never Used  Substance Use Topics   Alcohol use: Yes    Alcohol/week: 8.0 standard drinks of alcohol    Types: 8 Glasses of wine per week    Comment: 2 bottles of a wine a week   Drug use: No    Relationship status: married Patient lives with her husband.   Patient is not employed. Regular exercise: No History of abuse: No  Family History:   Family History  Problem Relation Age of Onset   Diabetes Mother    Coronary artery disease Mother    Heart disease Mother    Hyperlipidemia Mother    Sudden death Mother 38   Pancreatic cancer Mother        late 48s   Hypertension Brother    Liver disease Maternal Uncle    Heart disease Maternal Uncle    Heart disease Paternal Aunt    Stroke Maternal Grandmother    Colon cancer Maternal Grandfather    Breast cancer Cousin      Review of Systems: Review of Systems  Constitutional:  Positive for malaise/fatigue and weight loss. Negative for fever.  Respiratory:  Positive for  cough and shortness of breath. Negative for wheezing.   Cardiovascular:  Negative for chest pain, palpitations and leg swelling.  Gastrointestinal:  Positive for blood in stool. Negative for abdominal pain and constipation.       Leakage  Genitourinary:  Positive for frequency (night time) and urgency. Negative for dysuria and hematuria.       Leakage  Skin:  Negative for rash.  Neurological:  Negative for dizziness, weakness and headaches.  Endo/Heme/Allergies:  Does not bruise/bleed easily.  Psychiatric/Behavioral:  Negative for depression. The patient is not nervous/anxious.      OBJECTIVE Physical Exam: Vitals:   10/24/23 1011  BP: (!) 163/94  Pulse: 79   Physical Exam Constitutional:      General: She is not in acute distress.    Appearance: Normal appearance.  Genitourinary:     Bladder and urethral meatus normal.     No lesions in the vagina.     Right Labia: No rash, tenderness, lesions, skin changes or Bartholin's cyst.    Left Labia: No tenderness, lesions, skin changes, Bartholin's cyst or rash.    No vaginal discharge, erythema, tenderness, bleeding, ulceration or granulation tissue.      Right Adnexa: not tender, not full and no mass present.    Left Adnexa: not tender, not full and no mass present.    No cervical motion tenderness, discharge, friability, lesion, polyp or nabothian cyst.     Uterus is not enlarged, fixed, tender or irregular.     No uterine mass detected.    Urethral meatus caruncle not present.    Urethral stress urinary incontinence with cough stress test present.     No urethral prolapse, tenderness, mass, hypermobility or discharge present.     Bladder is not tender, urgency on palpation not present and masses not present.      Pelvic Floor: Levator muscle strength is 3/5.    Levator ani not tender, obturator internus not tender, no asymmetrical contractions present and no pelvic spasms present.    Symmetrical pelvic sensation, anal wink  present and BC reflex present. Cardiovascular:     Rate and Rhythm: Normal rate.  Pulmonary:     Effort: Pulmonary effort is normal. No respiratory distress.  Abdominal:     General: There is no distension.     Palpations: Abdomen is soft. There is no mass.     Tenderness: There is no abdominal tenderness.     Hernia: No hernia is present.  Neurological:     Mental Status: She is alert.  Vitals reviewed. Exam conducted with a chaperone present.      POP-Q:   POP-Q  -3                                             Aa    -3                                           Ba   -7                                              C    1                                            Gh  3                                             Pb   8                                            tvl   -3                                             Ap  -3                                             Bp  -7                                               D      Rectal Exam:  Normal sphincter tone, small distal rectocele, enterocoele not present, no rectal masses, no sign of dyssynergia when asking the patient to bear down.  Post-Void Residual (PVR) by Bladder Scan: In order to evaluate bladder emptying, we discussed obtaining a postvoid residual and patient agreed to this procedure.  Procedure: The ultrasound unit was placed on the patient's abdomen in the suprapubic region after the patient had voided.    Post Void Residual - 10/24/23 1021  Post Void Residual   Post Void Residual 13 mL           Laboratory Results: Lab Results  Component Value Date   COLORU brown (A) 10/24/2023   CLARITYU cloudy (A) 10/24/2023   GLUCOSEUR negative 10/24/2023   BILIRUBINUR negative 10/24/2023   SPECGRAV 1.020 10/24/2023   RBCUR trace-lysed (A) 10/24/2023   PHUR 5.5 10/24/2023   PROTEINUR NEGATIVE 02/18/2022   UROBILINOGEN 0.2 10/24/2023   LEUKOCYTESUR Small (1+) (A) 10/24/2023    Lab  Results  Component Value Date   CREATININE 0.74 02/21/2022   CREATININE 0.64 02/20/2022   CREATININE 0.75 02/19/2022    Lab Results  Component Value Date   HGBA1C 6.1 (H) 02/18/2022    Lab Results  Component Value Date   HGB 13.9 02/21/2022     ASSESSMENT AND PLAN Ms. Gough is a 58 y.o. with:  1. Nocturia   2. Urinary incontinence, mixed   3. Incontinence of feces, unspecified fecal incontinence type   4. Vaginal atrophy   5. Abnormal urinalysis   6. Tobacco use     Nocturia Assessment & Plan: - avoid fluid intake 3 hours before bedtime - switch diuretic (e.g. HCTZ) dosing to 2pm - continue CPAP for sleep apnea - possibly to CHF  Orders: -     POCT URINALYSIS DIP (CLINITEK) -     Trospium  Chloride ER; Take 1 capsule (60 mg total) by mouth daily.  Dispense: 30 capsule; Refill: 2  Urinary incontinence, mixed Assessment & Plan: - POCT UA + heme, pending UA microscopy. PVR 13mL - prior bladder tack with resolution of urinary leakage > 15 years ago by Dr. Eveline - We discussed the symptoms of overactive bladder (OAB), which include urinary urgency, urinary frequency, nocturia, with or without urge incontinence.  While we do not know the exact etiology of OAB, several treatment options exist. We discussed management including behavioral therapy (decreasing bladder irritants, urge suppression strategies, timed voids, bladder retraining), physical therapy, medication; for refractory cases posterior tibial nerve stimulation, sacral neuromodulation, and intravesical botulinum toxin injection.  For anticholinergic medications, we discussed the potential side effects of anticholinergics including dry eyes, dry mouth, constipation, cognitive impairment and urinary retention. For Beta-3 agonist medication, we discussed the potential side effect of elevated blood pressure which is more likely to occur in individuals with uncontrolled hypertension. - trial of gemtesa  with samples  provided. Rx changed to Trospium  due to cost - encouraged Kegel exercises - For treatment of stress urinary incontinence,  non-surgical options include expectant management, weight loss, physical therapy, as well as a pessary.  Surgical options include a midurethral sling, Burch urethropexy, and transurethral injection of a bulking agent. - trial of low dose vaginal estrogen - scheduled urodynamics due to history of bladder tack with possible midurethral sling  Orders: -     Estradiol ; Place 0.5-1 gram vaginally nightly for two weeks then twice weekly thereafter  Dispense: 30 g; Refill: 3 -     Urine Microscopic; Future -     Urine Culture; Future -     Trospium  Chloride ER; Take 1 capsule (60 mg total) by mouth daily.  Dispense: 30 capsule; Refill: 2  Incontinence of feces, unspecified fecal incontinence type Assessment & Plan: - h/o chronic pancreatitis with EtOH use, metformin  and opioid use, h/o SBO - unintended weight loss, blood in stool, tobacco use - Bristol VI stool - Treatment options include anti-diarrhea medication (loperamide/ Imodium OTC or prescription lomotil), fiber supplements, physical therapy, and possible  sacral neuromodulation or surgery.   - encouraged fiber supplementation and follow-up with GI for workup to r/o malignancy - trial of Trospium   - encouraged Kegel exercises   Vaginal atrophy Assessment & Plan: - For symptomatic vaginal atrophy options include lubrication with a water-based lubricant, personal hygiene measures and barrier protection against wetness, and estrogen replacement in the form of vaginal cream, vaginal tablets, or a time-released vaginal ring.   - Rx low dose vaginal estrogen  Orders: -     Estradiol ; Place 0.5-1 gram vaginally nightly for two weeks then twice weekly thereafter  Dispense: 30 g; Refill: 3  Abnormal urinalysis Assessment & Plan: - POCT UA + leuk/heme, pending UA microscopy  Orders: -     Urine Microscopic; Future -      Urine Culture; Future -     Trospium  Chloride ER; Take 1 capsule (60 mg total) by mouth daily.  Dispense: 30 capsule; Refill: 2  Tobacco use Assessment & Plan: - encouraged tobacco cessation due to CHF, h/o CABG - discussed association with cough and stress urinary leakage   Time spent: I spent 69 minutes dedicated to the care of this patient on the date of this encounter to include pre-visit review of records, face-to-face time with the patient discussing mixed urinary incontinence, nocturia, tobacco use, abnormal urinalysis, vaginal atrophy, fecal incontinence, and post visit documentation and ordering medication/ testing.   Lianne ONEIDA Gillis, MD

## 2023-10-24 NOTE — Patient Instructions (Signed)
 We discussed the symptoms of overactive bladder (OAB), which include urinary urgency, urinary frequency, night-time urination, with or without urge incontinence.  We discussed management including behavioral therapy (decreasing bladder irritants by following a bladder diet, urge suppression strategies, timed voids, bladder retraining), physical therapy, medication; and for refractory cases posterior tibial nerve stimulation, sacral neuromodulation, and intravesical botulinum toxin injection.   For Beta-3 agonist medication, we discussed the potential side effect of elevated blood pressure which is more likely to occur in individuals with uncontrolled hypertension. You were given samples for Gemtesa  75 mg.  It can take a month to start working so give it time, but if you have bothersome side effects call sooner and we can try a different medication.  Call us  if you have trouble filling the prescription or if it's not covered by your insurance.  For treatment of stress urinary incontinence, which is leakage with physical activity/movement/strainging/coughing, we discussed expectant management versus nonsurgical options versus surgery. Nonsurgical options include weight loss, physical therapy, as well as a pessary.  Surgical options include a midurethral sling, which is a synthetic mesh sling that acts like a hammock under the urethra to prevent leakage of urine, a Burch urethropexy, and transurethral injection of a bulking agent.   For vaginal atrophy (thinning of the vaginal tissue that can cause dryness and burning) and UTI prevention we discussed estrogen replacement in the form of vaginal cream.   Start vaginal estrogen therapy nightly for two weeks then 2 times weekly at night. This can be placed with your finger or an applicator inside the vagina and around the urethra.  Please let us  know if the prescription is too expensive and we can look for alternative options.   Is vaginal estrogen therapy safe  for me? Vaginal estrogen preparations act on the vaginal skin, and only a very tiny amount is absorbed into the bloodstream (0.01%).  They work in a similar way to hand or face cream.  There is minimal absorption and they are therefore perfectly safe. If you have had breast cancer and have persistent troublesome symptoms which aren't settling with vaginal moisturisers and lubricants, local estrogen treatment may be a possibility, but consultation with your oncologist should take place first.   Accidental Bowel Leakage:  - Treatment options include anti-diarrhea medication (loperamide/ Imodium OTC or prescription lomotil), fiber supplements, physical therapy, and possible sacral neuromodulation or surgery.     Women should try to eat at least 21 to 25 grams of fiber a day, while men should aim for 30 to 38 grams a day. You can add fiber to your diet with food or a fiber supplement such as psyllium (metamucil), benefiber, or fibercon.   Here's a look at how much dietary fiber is found in some common foods. When buying packaged foods, check the Nutrition Facts label for fiber content. It can vary among brands.  Fruits Serving size Total fiber (grams)*  Raspberries 1 cup 8.0  Pear 1 medium 5.5  Apple, with skin 1 medium 4.5  Banana 1 medium 3.0  Orange 1 medium 3.0  Strawberries 1 cup 3.0   Vegetables Serving size Total fiber (grams)*  Green peas, boiled 1 cup 9.0  Broccoli, boiled 1 cup chopped 5.0  Turnip greens, boiled 1 cup 5.0  Brussels sprouts, boiled 1 cup 4.0  Potato, with skin, baked 1 medium 4.0  Sweet corn, boiled 1 cup 3.5  Cauliflower, raw 1 cup chopped 2.0  Carrot, raw 1 medium 1.5   Grains Serving size  Total fiber (grams)*  Spaghetti, whole-wheat, cooked 1 cup 6.0  Barley, pearled, cooked 1 cup 6.0  Bran flakes 3/4 cup 5.5  Quinoa, cooked 1 cup 5.0  Oat bran muffin 1 medium 5.0  Oatmeal, instant, cooked 1 cup 5.0  Popcorn, air-popped 3 cups 3.5  Brown rice, cooked 1  cup 3.5  Bread, whole-wheat 1 slice 2.0  Bread, rye 1 slice 2.0   Legumes, nuts and seeds Serving size Total fiber (grams)*  Split peas, boiled 1 cup 16.0  Lentils, boiled 1 cup 15.5  Black beans, boiled 1 cup 15.0  Baked beans, canned 1 cup 10.0  Chia seeds 1 ounce 10.0  Almonds 1 ounce (23 nuts) 3.5  Pistachios 1 ounce (49 nuts) 3.0  Sunflower kernels 1 ounce 3.0  *Rounded to nearest 0.5 gram. Source: [employer] for Standard Reference, Legacy Release    Please return to your gastroenterologist regarding your blood in stool workup.

## 2023-10-25 ENCOUNTER — Ambulatory Visit: Payer: Self-pay | Admitting: Obstetrics

## 2023-10-25 ENCOUNTER — Other Ambulatory Visit: Payer: Self-pay

## 2023-10-26 LAB — URINE CULTURE: Culture: >=100000 — AB

## 2023-11-04 ENCOUNTER — Other Ambulatory Visit: Payer: Self-pay

## 2023-11-04 DIAGNOSIS — G4733 Obstructive sleep apnea (adult) (pediatric): Secondary | ICD-10-CM

## 2023-11-14 ENCOUNTER — Other Ambulatory Visit: Payer: Self-pay

## 2023-11-15 ENCOUNTER — Other Ambulatory Visit: Payer: Self-pay

## 2023-11-22 ENCOUNTER — Other Ambulatory Visit: Payer: Self-pay

## 2023-11-22 MED ORDER — HYDROCODONE-ACETAMINOPHEN 5-325 MG PO TABS
1.0000 | ORAL_TABLET | Freq: Four times a day (QID) | ORAL | 0 refills | Status: DC | PRN
  Filled 2023-11-22: qty 30, 8d supply, fill #0

## 2023-11-22 MED ORDER — HYDROCODONE-ACETAMINOPHEN 5-325 MG PO TABS
1.0000 | ORAL_TABLET | Freq: Four times a day (QID) | ORAL | 0 refills | Status: DC | PRN
  Filled 2024-01-14: qty 30, 7d supply, fill #0

## 2023-11-22 MED ORDER — HYDROCODONE-ACETAMINOPHEN 5-325 MG PO TABS
1.0000 | ORAL_TABLET | Freq: Four times a day (QID) | ORAL | 0 refills | Status: DC | PRN
  Filled 2023-12-20: qty 30, 8d supply, fill #0

## 2023-11-27 ENCOUNTER — Ambulatory Visit (INDEPENDENT_AMBULATORY_CARE_PROVIDER_SITE_OTHER): Payer: Self-pay | Admitting: Obstetrics and Gynecology

## 2023-11-27 ENCOUNTER — Other Ambulatory Visit (HOSPITAL_COMMUNITY)
Admission: RE | Admit: 2023-11-27 | Discharge: 2023-11-27 | Disposition: A | Discharge status: Home / Self Care | Payer: Self-pay | Source: Ambulatory Visit | Attending: Obstetrics and Gynecology | Admitting: Obstetrics and Gynecology

## 2023-11-27 ENCOUNTER — Other Ambulatory Visit: Payer: Self-pay

## 2023-11-27 ENCOUNTER — Encounter: Payer: Self-pay | Admitting: Obstetrics and Gynecology

## 2023-11-27 VITALS — BP 159/92 | HR 90

## 2023-11-27 DIAGNOSIS — R82998 Other abnormal findings in urine: Secondary | ICD-10-CM | POA: Insufficient documentation

## 2023-11-27 DIAGNOSIS — R319 Hematuria, unspecified: Secondary | ICD-10-CM

## 2023-11-27 DIAGNOSIS — R350 Frequency of micturition: Secondary | ICD-10-CM

## 2023-11-27 DIAGNOSIS — N3001 Acute cystitis with hematuria: Secondary | ICD-10-CM

## 2023-11-27 LAB — URINALYSIS, ROUTINE W REFLEX MICROSCOPIC
Bilirubin Urine: NEGATIVE
Glucose, UA: NEGATIVE mg/dL
Ketones, ur: NEGATIVE mg/dL
Nitrite: POSITIVE — AB
Protein, ur: 30 mg/dL — AB
RBC / HPF: >50 RBC/hpf (ref 0–5)
Specific Gravity, Urine: 1.018 (ref 1.005–1.030)
WBC, UA: >50 WBC/hpf (ref 0–5)
pH: 5 (ref 5.0–8.0)

## 2023-11-27 LAB — POCT URINALYSIS DIP (CLINITEK)
Bilirubin, UA: NEGATIVE
Glucose, UA: NEGATIVE mg/dL
Ketones, POC UA: NEGATIVE mg/dL
Nitrite, UA: POSITIVE — AB
POC PROTEIN,UA: 30 — AB
Spec Grav, UA: 1.025 (ref 1.010–1.025)
Urobilinogen, UA: 0.2 U/dL
pH, UA: 5.5 (ref 5.0–8.0)

## 2023-11-27 MED ORDER — CIPROFLOXACIN HCL 250 MG PO TABS
250.0000 mg | ORAL_TABLET | Freq: Two times a day (BID) | ORAL | 0 refills | Status: AC
  Filled 2023-11-27: qty 6, 3d supply, fill #0

## 2023-11-27 NOTE — Progress Notes (Signed)
 [facility] Return Visit  SUBJECTIVE  History of Present Illness: Marie [name] is a 58 y.o. female seen today for urodynamic testing.     Past Medical History: Patient  has a past medical history of (HFpEF) heart failure with preserved ejection fraction (HCC), Chronic pancreatitis (HCC), Coronary atherosclerosis of unspecified type of vessel, native or graft, Family history of pancreatic cancer, Headache(784.0), Hyperlipidemia LDL goal <70, Hypertension, Leiomyoma of uterus, unspecified, Loss of weight, Obesity, unspecified, OSA (obstructive sleep apnea), Pulmonary sarcoidosis (HCC), Pure hypercholesterolemia, Shortness of breath, and Type II diabetes mellitus (HCC).   Past Surgical History: She  has a past surgical history that includes Coronary artery bypass graft (~ 2000); Tubal ligation (1997); Hysteroscopy with novasure (N/A, 09/04/2012); Robotic assisted laparoscopic cholecystectomy-multi site (N/A, 01/14/2018); Coronary angioplasty with stent (09/2009); LEEP (N/A, 12/25/2018); Breast cyst aspiration; and Breast excisional biopsy.   Medications: She has a current medication list which includes the following prescription(s): atorvastatin , ciprofloxacin , clopidogrel , clopidogrel , estradiol , trelegy ellipta , hydrochlorothiazide , hydrocodone -acetaminophen , hydrocodone -acetaminophen , hydrocodone -acetaminophen , hydrocodone -acetaminophen , hydrocodone -acetaminophen , hydrocodone -acetaminophen , hydrocodone -acetaminophen , hydrocodone -acetaminophen , hydrocodone -acetaminophen , isosorbide  dinitrate, isosorbide  mononitrate, losartan , metformin , metformin , metoprolol  tartrate, and trospium  chloride.   Allergies: Patient is allergic to penicillins and lisinopril .   Social History: Patient  reports that she has been smoking cigarettes. She has a 16 pack-year smoking history. She has never used smokeless tobacco. She reports current alcohol use of about 8.0 standard drinks of alcohol per  week. She reports that she does not use drugs.     OBJECTIVE    Lab Results  Component Value Date   COLORU yellow 11/27/2023   CLARITYU cloudy (A) 11/27/2023   GLUCOSEUR negative 11/27/2023   BILIRUBINUR negative 11/27/2023   SPECGRAV 1.025 11/27/2023   RBCUR large (A) 11/27/2023   PHUR 5.5 11/27/2023   PROTEINUR 30 (A) 10/24/2023   UROBILINOGEN 0.2 11/27/2023   LEUKOCYTESUR Small (1+) (A) 11/27/2023     Physical Exam: Vitals:   11/27/23 0926  BP: (!) 159/92  Pulse: 90   Gen: No apparent distress, A&O x 3.  Detailed Urogynecologic Evaluation:  External exam normal. Cathed sample done on exam for intention of urodynamics.    ASSESSMENT AND PLAN    Ms. Rufer is a 58 y.o. with:  1. Acute cystitis with hematuria   2. Urinary frequency   3. Leukocytes in urine   4. Hematuria, unspecified type    Patient unable to complete urodynamics today due to UTI signs. She denies symptoms except a slight tingling at the end of the urethra. Will send in antibiotics. Patient reports macrobid makes her feel ill. Her previous culture had resistances to it so Cipro  was sent in. Patient to be rescheduled for Urodynamics.    Shogo Larkey G Jefferey Lippmann, NP

## 2023-11-27 NOTE — Patient Instructions (Signed)
 Take Cipro  twice a day for 3 days. We will reschedule you for the bladder test. Please return with a full bladder for next visit.

## 2023-11-29 ENCOUNTER — Ambulatory Visit: Payer: Self-pay | Admitting: Obstetrics and Gynecology

## 2023-11-29 LAB — URINE CULTURE: Culture: >=100000 — AB

## 2023-12-06 ENCOUNTER — Other Ambulatory Visit: Payer: Self-pay

## 2023-12-06 MED ORDER — HYDROCORTISONE ACETATE 25 MG RE SUPP
25.0000 mg | Freq: Every day | RECTAL | 1 refills | Status: AC
  Filled 2023-12-06: qty 12, 12d supply, fill #0

## 2023-12-16 ENCOUNTER — Other Ambulatory Visit: Payer: Self-pay

## 2023-12-19 ENCOUNTER — Other Ambulatory Visit: Payer: Self-pay

## 2023-12-20 ENCOUNTER — Other Ambulatory Visit: Payer: Self-pay

## 2023-12-25 ENCOUNTER — Ambulatory Visit: Admitting: Obstetrics and Gynecology

## 2023-12-25 VITALS — BP 162/84 | HR 80

## 2023-12-25 DIAGNOSIS — N3281 Overactive bladder: Secondary | ICD-10-CM | POA: Diagnosis not present

## 2023-12-25 DIAGNOSIS — N3946 Mixed incontinence: Secondary | ICD-10-CM

## 2023-12-25 DIAGNOSIS — R350 Frequency of micturition: Secondary | ICD-10-CM

## 2023-12-25 DIAGNOSIS — R351 Nocturia: Secondary | ICD-10-CM

## 2023-12-25 LAB — POCT URINALYSIS DIP (CLINITEK)
Bilirubin, UA: NEGATIVE
Blood, UA: NEGATIVE
Glucose, UA: NEGATIVE mg/dL
Ketones, POC UA: NEGATIVE mg/dL
Leukocytes, UA: NEGATIVE
Nitrite, UA: NEGATIVE
POC PROTEIN,UA: NEGATIVE
Spec Grav, UA: 1.025 (ref 1.010–1.025)
Urobilinogen, UA: 0.2 U/dL
pH, UA: 5.5 (ref 5.0–8.0)

## 2023-12-25 NOTE — Progress Notes (Signed)
 Marland Kitchen  proc

## 2023-12-25 NOTE — Progress Notes (Signed)
 [facility]ro[facility]necology Urodynamics Procedure  Referring Physician: No ref. provider found Date of Procedure: 12/25/2023  Marie [name] is a 58 y.o. female who presents for urodynamic evaluation. Indication(s) for study: mixed incontinence  Vital Signs: BP (!) 162/84   Pulse 80   Laboratory Results: A catheterized urine specimen revealed:  POC urine:  Lab Results  Component Value Date   COLORU yellow 11/27/2023   CLARITYU cloudy (A) 11/27/2023   GLUCOSEUR negative 11/27/2023   BILIRUBINUR NEGATIVE 11/27/2023   SPECGRAV 1.025 11/27/2023   RBCUR large (A) 11/27/2023   PHUR 5.5 11/27/2023   PROTEINUR 30 (A) 11/27/2023   UROBILINOGEN 0.2 11/27/2023   LEUKOCYTESUR LARGE (A) 11/27/2023     Voiding Diary: Deferred  Procedure Timeout:  The correct patient was verified and the correct procedure was verified. The patient was in the correct position and safety precautions were reviewed based on at the patient's history.  Urodynamic Procedure A 73F dual lumen urodynamics catheter was placed under sterile conditions into the patient's bladder. A 73F catheter was placed into the rectum in order to measure abdominal pressure. EMG patches were placed in the appropriate position.  All connections were confirmed and calibrations/adjusted made. Saline was instilled into the bladder through the dual lumen catheters.  Cough/valsalva pressures were measured periodically during filling.  Patient was allowed to void.  The bladder was then emptied of its residual.  UROFLOW: Revealed a Qmax of 16 mL/sec.  She voided 62 mL and had a residual of 10 mL.  It was a normal pattern and represented normal habits though interpretation limited due to low voided volume.  CMG: This was performed with sterile water in the sitting position at a fill rate of 30 mL/min.    First sensation of fullness was 115 mLs,  First urge was 118 mLs,  Strong urge was 204 mLs and  Capacity was 175* mLs  *There was a  small amount of urine around the pad of the cup. Unsure of amount  Stress incontinence was not demonstrated Highest negative CLPP was 211 cmH20 at 193 ml. Highest negative VLPP was 102 cmH20at 193 ml.   Detrusor function was overactive, with phasic contractions seen.  The first occurred at 36 mL to 4 cm of water and was not associated with leakage. Patient had a DO at which she could not control and had a significant loss of urine.   Compliance:  Low. End fill detrusor pressure was 28cmH20.  Calculated compliance was 7.50mL/cmH20  UPP: MUCP without barrier reduction was 70 cm of water.    MICTURITION STUDY: Voiding was performed without reduction in the sitting position.  Pdet at Qmax was 65 cm of water.  Qmax was 9 mL/sec.  It was a interrupted pattern.  She voided 150 mL and had a residual of 25 mL.  It was a volitional void, sustained detrusor contraction was present and abdominal straining was present  EMG: This was performed with patches.  She had voluntary contractions, recruitment with fill was not present and urethral sphincter was relaxed with void.  The details of the procedure with the study tracings have been scanned into EPIC.   Urodynamic Impression:  1. Sensation was normal; capacity was reduced 2. Stress Incontinence was not demonstrated at normal pressures; 3. Detrusor Overactivity was demonstrated with leakage. 4. Emptying was dysfunctional with a normal PVR, a sustained detrusor contraction present,  abdominal straining not present, normal urethral sphincter activity on EMG.  Plan: - The patient will follow  up  to discuss the findings and treatment options.

## 2023-12-31 ENCOUNTER — Other Ambulatory Visit: Payer: Self-pay

## 2023-12-31 ENCOUNTER — Ambulatory Visit (INDEPENDENT_AMBULATORY_CARE_PROVIDER_SITE_OTHER): Admitting: Obstetrics

## 2023-12-31 ENCOUNTER — Encounter: Payer: Self-pay | Admitting: Obstetrics

## 2023-12-31 VITALS — BP 136/84 | HR 83

## 2023-12-31 DIAGNOSIS — Z720 Tobacco use: Secondary | ICD-10-CM

## 2023-12-31 DIAGNOSIS — R8290 Unspecified abnormal findings in urine: Secondary | ICD-10-CM

## 2023-12-31 DIAGNOSIS — N3946 Mixed incontinence: Secondary | ICD-10-CM | POA: Diagnosis not present

## 2023-12-31 DIAGNOSIS — R159 Full incontinence of feces: Secondary | ICD-10-CM

## 2023-12-31 DIAGNOSIS — R351 Nocturia: Secondary | ICD-10-CM

## 2023-12-31 DIAGNOSIS — N952 Postmenopausal atrophic vaginitis: Secondary | ICD-10-CM

## 2023-12-31 DIAGNOSIS — I5033 Acute on chronic diastolic (congestive) heart failure: Secondary | ICD-10-CM

## 2023-12-31 DIAGNOSIS — Z8742 Personal history of other diseases of the female genital tract: Secondary | ICD-10-CM

## 2023-12-31 DIAGNOSIS — G8929 Other chronic pain: Secondary | ICD-10-CM

## 2023-12-31 DIAGNOSIS — F1090 Alcohol use, unspecified, uncomplicated: Secondary | ICD-10-CM

## 2023-12-31 MED ORDER — ESTROGENS CONJUGATED 0.625 MG/GM VA CREA
1.0000 | TOPICAL_CREAM | VAGINAL | 1 refills | Status: AC
  Filled 2023-12-31: qty 30, 30d supply, fill #0

## 2023-12-31 NOTE — Assessment & Plan Note (Signed)
-   For symptomatic vaginal atrophy options include lubrication with a water-based lubricant, personal hygiene measures and barrier protection against wetness, and estrogen replacement in the form of vaginal cream, vaginal tablets, or a time-released vaginal ring.   - Rx low dose vaginal estrogen cost prohibitive. Rx changed to premarin and advised pt to call if cost prohibitive. Instructions for [facility] provided

## 2023-12-31 NOTE — Assessment & Plan Note (Addendum)
-   will need preoperative risk assessment by Dr. Barbaraann (cardiology) and plan for anti-coagulation with Plavix  prior to percutaneous nerve evaluation

## 2023-12-31 NOTE — Assessment & Plan Note (Signed)
-   avoid fluid intake 3 hours before bedtime - switch diuretic (e.g. HCTZ) dosing to 2pm - continue CPAP for sleep apnea - possibly to CHF - no relief with Trospium

## 2023-12-31 NOTE — Progress Notes (Unsigned)
 [facility] Return Visit  SUBJECTIVE  History of Present Illness: Marie [name] is a 58 y.o. female seen in follow-up for mixed urinary incontinence, nocturia, tobacco use, abnormal urinalysis, vaginal atrophy, fecal incontinence . Plan at last visit was trial of trospium , continue CPAP, switch hydrochlorothiazide  to 2pm, low dose vaginal estrogen.   Gemtesa  cost prohibitive  Did not start low dose vaginal estrogen due to cost ($174/tube) Trospium  without relief Leaks 4-5 time(s) per days with activity, 2x/day since last visit Leaks 3x/week with urgency, now 1-2x/day Pad use: 3-4 pads/day.   Bowel leakage 2x/week, blood in stool evaluated by Jacqueline Thigpen, NP (GI) 12/06/23 and recommended Anusol , sitz baths, fiber supplementation, increase water intake and activity. Recommended follow-up in 3 months and possible repeat colonoscopy if persistent symptoms.  Pulmonary sarcoidosis with tobacco use Plavix  for HF with h/o CABG Chronic PRN Norco use by Dr. Auston for chronic pancreatitis and Creon  use. Tobacco use 1/2 pack/day  Urodynamic Impression 12/25/23:  1. Sensation was normal; capacity was reduced at 2. Stress Incontinence was not demonstrated at normal pressures with low volume; 3. 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 Compliance  7.54mL/cmH20  4. Emptying was dysfunctional with a normal PVR 25mL, a sustained detrusor contraction present,  abdominal straining not present, normal urethral sphincter activity on EMG.  Pdet at Qmax was 65 cm of water.  Qmax was 9 mL/sec.  It was a interrupted pattern   HbA1C 6.9 per documentation in 02/28/23   Past Medical History: Patient  has a past medical history of (HFpEF) heart failure with preserved ejection fraction (HCC), Chronic pancreatitis (HCC), Coronary atherosclerosis of unspecified type of vessel, native or graft, Family history of pancreatic cancer, Headache(784.0), Hyperlipidemia LDL goal <70,  Hypertension, Leiomyoma of uterus, unspecified, Loss of weight, Obesity, unspecified, OSA (obstructive sleep apnea), Pulmonary sarcoidosis, Pure hypercholesterolemia, Shortness of breath, and Type II diabetes mellitus (HCC).   Past Surgical History: She  has a past surgical history that includes Coronary artery bypass graft (~ 2000); Tubal ligation (1997); Hysteroscopy with novasure (N/A, 09/04/2012); Robotic assisted laparoscopic cholecystectomy-multi site (N/A, 01/14/2018); Coronary angioplasty with stent (09/2009); LEEP (N/A, 12/25/2018); Breast cyst aspiration; and Breast excisional biopsy.   Medications: She has a current medication list which includes the following prescription(s): [START ON 01/02/2024] conjugated estrogens, atorvastatin , clopidogrel , clopidogrel , trelegy ellipta , hydrochlorothiazide , hydrocodone -acetaminophen , hydrocodone -acetaminophen , hydrocodone -acetaminophen , hydrocodone -acetaminophen , hydrocodone -acetaminophen , hydrocodone -acetaminophen , hydrocodone -acetaminophen , hydrocodone -acetaminophen , hydrocodone -acetaminophen , hydrocortisone , isosorbide  dinitrate, isosorbide  mononitrate, losartan , metformin , metformin , metoprolol  tartrate, and trospium  chloride.   Allergies: Patient is allergic to penicillins and lisinopril .   Social History: Patient  reports that she has been smoking cigarettes. She has a 16 pack-year smoking history. She has never used smokeless tobacco. She reports current alcohol use of about 8.0 standard drinks of alcohol per week. She reports that she does not use drugs.     OBJECTIVE     Physical Exam: Vitals:   12/31/23 0914  BP: 136/84  Pulse: 83   Gen: No apparent distress, A&O x 3.  Detailed Urogynecologic Evaluation:  Deferred.       ASSESSMENT AND PLAN    [name] is a 58 y.o. with:  1. Vaginal atrophy   2. Incontinence of feces, unspecified fecal incontinence type   3. Tobacco use   4. Urinary incontinence, mixed   5. Nocturia    6. Abnormal urinalysis   7. Acute on chronic diastolic CHF (congestive heart failure) (HCC)   8. Alcohol use   9. Other chronic pain  Vaginal atrophy Assessment & Plan: - For symptomatic vaginal atrophy options include lubrication with a water-based lubricant, personal hygiene measures and barrier protection against wetness, and estrogen replacement in the form of vaginal cream, vaginal tablets, or a time-released vaginal ring.   - Rx low dose vaginal estrogen cost prohibitive. Rx changed to premarin and advised pt to call if cost prohibitive. Instructions for [facility] provided  Orders: -     Estrogens Conjugated; Place 1 Applicatorful vaginally 2 (two) times a week. Place 0.5g nightly for two weeks then twice a week after  Dispense: 30 g; Refill: 1  Incontinence of feces, unspecified fecal incontinence type Assessment & Plan: - h/o chronic pancreatitis with EtOH use, metformin  and opioid use, h/o SBO - unintended weight loss, blood in stool, tobacco use - Bristol VI stool - Treatment options include anti-diarrhea medication (loperamide/ Imodium OTC or prescription lomotil), fiber supplements, physical therapy, and possible sacral neuromodulation or surgery.   - encouraged fiber supplementation and follow-up with GI for workup to r/o malignancy - trial of Trospium  without relief - encouraged Kegel exercises - pt desire to proceed with PNE, will require preoperative risk assessment prior to scheduling procedure   Tobacco use Assessment & Plan: - encouraged tobacco cessation due to CHF, h/o CABG - discussed association with cough and stress urinary leakage - We recommend tobacco cessation for 4-6 weeks prior to surgery to reduce surgical site infection, heart/lung complications, and risk of delayed wound healing.    Urinary incontinence, mixed Assessment & Plan: - prior bladder tack with resolution of urinary leakage > 15 years ago by Dr. Eveline - We discussed the symptoms  of overactive bladder (OAB), which include urinary urgency, urinary frequency, nocturia, with or without urge incontinence.  While we do not know the exact etiology of OAB, several treatment options exist. We discussed management including behavioral therapy (decreasing bladder irritants, urge suppression strategies, timed voids, bladder retraining), physical therapy, medication; for refractory cases posterior tibial nerve stimulation, sacral neuromodulation, and intravesical botulinum toxin injection.  For anticholinergic medications, we discussed the potential side effects of anticholinergics including dry eyes, dry mouth, constipation, cognitive impairment and urinary retention. For Beta-3 agonist medication, we discussed the potential side effect of elevated blood pressure which is more likely to occur in individuals with uncontrolled hypertension. - trial of gemtesa  with samples provided. Rx changed to Trospium  due to cost without relief - discouraged Kegel exercises - For treatment of stress urinary incontinence,  non-surgical options include expectant management, weight loss, physical therapy, as well as a pessary.  Surgical options include a midurethral sling, Burch urethropexy, and transurethral injection of a bulking agent. - trial of low dose vaginal estrogen cost prohibitive, Rx premarin and provided instructions for [facility] - urodynamics due to history of bladder tack showed reduced MCC , no SUI, DOI, pressure flow with 25mL PVR. Pdet at Qmax was 65 cm of water.  Qmax was 9 mL/sec.  Interrupted pattern For refractory OAB we reviewed the procedure for intravesical Botox injection with cystoscopy in the office and reviewed the risks, benefits and alternatives of treatment including but not limited to infection, need for self-catheterization and need for repeat therapy.  We discussed that there is a 5-15% chance of needing to catheterize with Botox and that this usually resolves in a few  months; however can persist for longer periods of time.  Typically Botox injections would need to be repeated every 3-12 months since this is not a permanent therapy.   We discussed the  role of sacral neuromodulation and how it works. It requires a test phase, and documentation of bladder function via diary. After a successful test period, a permanent wire and generator are placed in the OR. The battery lasts 5 years on average and would need to be replaced surgically.  The goal of this therapy is at least a 50% improvement in symptoms. It is NOT realistic to expect a 100% cure.  We reviewed the fact that about 30% of patients fail the test phase and are not candidates for permanent generator placement.  We discussed the risk of infection and that the patient would have to switch to MRI compatible mode during imaging once the device is placed. There are two companies that provide this therapy: [employer] and [employer] that both offer rechargeable or non-rechargeable options. For all procedures, we discussed risks of bleeding, infection, damage to surrounding organs including bowel, bladder, blood vessels, ureters and nerves, need for further surgery, risk of postoperative urinary incontinence or retention with need to catheterize, recurrent prolapse, numbness and weakness at any body site, buttock pain, and the rarer risks of blood clot, heart attack, pneumonia, death.    We also discussed the role of percutaneous tibial nerve stimulation and how it works.  She understands it requires 12 weekly visits for temporary neuromodulation of the sacral nerve roots via the tibial nerve and that she may then require continued tapered treatment.  - declines botox injections, desires to proceed with PNE. Will need perioperative risk assessment with cardiology and pulm, tobacco cessation, GI workup prior to procedure.    Nocturia Assessment & Plan: - avoid fluid intake 3 hours before bedtime - switch diuretic (e.g. HCTZ)  dosing to 2pm - continue CPAP for sleep apnea - possibly to CHF - no relief with Trospium    Abnormal urinalysis Assessment & Plan: - 10/24/23 POCT UA + leuk/heme, UA microscopy 0-5 RBC/hpf. Culture with > 100K E. Coli resistant to ampicillin, bactrim , intermediate to Augmentin. No UTI symptoms - 11/27/23 urodynamics cancelled due to UTI symptoms.  POCT UA + leuk/heme/nit, UA microscopy 0-5 RBC/hpf. Culture with > 100K E. Coli resistant to ampicillin, bactrim , intermediate to Augmentin. Rx Cipro .  - 12/25/23 UA negative   Acute on chronic diastolic CHF (congestive heart failure) (HCC) Assessment & Plan: - will need preoperative risk assessment by Dr. Barbaraann (cardiology) and plan for anti-coagulation with Plavix  prior to percutaneous nerve evaluation   Alcohol use Assessment & Plan: - encouraged reduction of EtOH use prior to surgery due to increased risk of perioperative complications such as infections, cardio/pulm issues, bleeding, delayed healing, and postoperative delirium.  - recommended avoiding alcohol use for 2 weeks prior to procedure   Other chronic pain Assessment & Plan: - PRN Norco use #30/month for chronic pancreatitis - discussed risk of sub-optimal perioperative pain control that may require adjustment of baseline pain medication    Discussed with patient need to assess perioperative risks prior to scheduling procedure. Letters sent to Dr. Barbaraann (cardiology) and Dr. Kara (pulm). Will likely require anesthesia consult if she is a surgical candidate. Assess LFTs and CBC.  Time spent: I spent 44 minutes dedicated to the care of this patient on the date of this encounter to include pre-visit review of records, face-to-face time with the patient discussing mixed urinary incontinence, fecal incontinence, vaginal atrophy, nocturia, alcohol use, chronic pain, abnormal urinalysis, and post visit documentation and ordering medication/ testing.   Lianne ONEIDA Gillis, MD

## 2023-12-31 NOTE — Assessment & Plan Note (Addendum)
-   h/o chronic pancreatitis with EtOH use, metformin  and opioid use, h/o SBO - unintended weight loss, blood in stool, tobacco use - Bristol VI stool - Treatment options include anti-diarrhea medication (loperamide/ Imodium OTC or prescription lomotil), fiber supplements, physical therapy, and possible sacral neuromodulation or surgery.   - encouraged fiber supplementation and follow-up with GI for workup to r/o malignancy - trial of Trospium  without relief - encouraged Kegel exercises - pt desire to proceed with PNE, will require preoperative risk assessment prior to scheduling procedure

## 2023-12-31 NOTE — Assessment & Plan Note (Signed)
-   encouraged tobacco cessation due to CHF, h/o CABG - discussed association with cough and stress urinary leakage - We recommend tobacco cessation for 4-6 weeks prior to surgery to reduce surgical site infection, heart/lung complications, and risk of delayed wound healing.

## 2023-12-31 NOTE — Assessment & Plan Note (Signed)
-   10/24/23 POCT UA + leuk/heme, UA microscopy 0-5 RBC/hpf. Culture with > 100K E. Coli resistant to ampicillin, bactrim , intermediate to Augmentin. No UTI symptoms - 11/27/23 urodynamics cancelled due to UTI symptoms.  POCT UA + leuk/heme/nit, UA microscopy 0-5 RBC/hpf. Culture with > 100K E. Coli resistant to ampicillin, bactrim , intermediate to Augmentin. Rx Cipro .  - 12/25/23 UA negative

## 2023-12-31 NOTE — Assessment & Plan Note (Signed)
-   PRN Norco use #30/month for chronic pancreatitis - discussed risk of sub-optimal perioperative pain control that may require adjustment of baseline pain medication

## 2023-12-31 NOTE — Patient Instructions (Addendum)
 Start premarin vaginal estrogen cream  Please visit the website below to sign up for an account. We will need an email address to send along with your prescription to verify your prescription once you have signed up.  [email or web address]

## 2023-12-31 NOTE — Assessment & Plan Note (Addendum)
-   encouraged reduction of EtOH use prior to surgery due to increased risk of perioperative complications such as infections, cardio/pulm issues, bleeding, delayed healing, and postoperative delirium.  - recommended avoiding alcohol use for 2 weeks prior to procedure

## 2023-12-31 NOTE — Assessment & Plan Note (Addendum)
 - prior bladder tack with resolution of urinary leakage > 15 years ago by Dr. Eveline - We discussed the symptoms of overactive bladder (OAB), which include urinary urgency, urinary frequency, nocturia, with or without urge incontinence.  While we do not know the exact etiology of OAB, several treatment options exist. We discussed management including behavioral therapy (decreasing bladder irritants, urge suppression strategies, timed voids, bladder retraining), physical therapy, medication; for refractory cases posterior tibial nerve stimulation, sacral neuromodulation, and intravesical botulinum toxin injection.  For anticholinergic medications, we discussed the potential side effects of anticholinergics including dry eyes, dry mouth, constipation, cognitive impairment and urinary retention. For Beta-3 agonist medication, we discussed the potential side effect of elevated blood pressure which is more likely to occur in individuals with uncontrolled hypertension. - trial of gemtesa  with samples provided. Rx changed to Trospium  due to cost without relief - discouraged Kegel exercises - For treatment of stress urinary incontinence,  non-surgical options include expectant management, weight loss, physical therapy, as well as a pessary.  Surgical options include a midurethral sling, Burch urethropexy, and transurethral injection of a bulking agent. - trial of low dose vaginal estrogen cost prohibitive, Rx premarin and provided instructions for [facility] - urodynamics due to history of bladder tack showed reduced MCC , no SUI, DOI, pressure flow with 25mL PVR. Pdet at Qmax was 65 cm of water.  Qmax was 9 mL/sec.  Interrupted pattern For refractory OAB we reviewed the procedure for intravesical Botox injection with cystoscopy in the office and reviewed the risks, benefits and alternatives of treatment including but not limited to infection, need for self-catheterization and need for repeat therapy.  We  discussed that there is a 5-15% chance of needing to catheterize with Botox and that this usually resolves in a few months; however can persist for longer periods of time.  Typically Botox injections would need to be repeated every 3-12 months since this is not a permanent therapy.   We discussed the role of sacral neuromodulation and how it works. It requires a test phase, and documentation of bladder function via diary. After a successful test period, a permanent wire and generator are placed in the OR. The battery lasts 5 years on average and would need to be replaced surgically.  The goal of this therapy is at least a 50% improvement in symptoms. It is NOT realistic to expect a 100% cure.  We reviewed the fact that about 30% of patients fail the test phase and are not candidates for permanent generator placement.  We discussed the risk of infection and that the patient would have to switch to MRI compatible mode during imaging once the device is placed. There are two companies that provide this therapy: [employer] and [employer] that both offer rechargeable or non-rechargeable options. For all procedures, we discussed risks of bleeding, infection, damage to surrounding organs including bowel, bladder, blood vessels, ureters and nerves, need for further surgery, risk of postoperative urinary incontinence or retention with need to catheterize, recurrent prolapse, numbness and weakness at any body site, buttock pain, and the rarer risks of blood clot, heart attack, pneumonia, death.    We also discussed the role of percutaneous tibial nerve stimulation and how it works.  She understands it requires 12 weekly visits for temporary neuromodulation of the sacral nerve roots via the tibial nerve and that she may then require continued tapered treatment.  - declines botox injections, desires to proceed with PNE. Will need perioperative risk assessment with cardiology and pulm,  tobacco cessation, GI workup prior to  procedure.

## 2024-01-01 ENCOUNTER — Encounter: Payer: Self-pay | Admitting: Obstetrics

## 2024-01-01 DIAGNOSIS — Z8742 Personal history of other diseases of the female genital tract: Secondary | ICD-10-CM | POA: Insufficient documentation

## 2024-01-01 NOTE — Assessment & Plan Note (Signed)
-   h/o LEEP for CIN III  - NILM neg HPV 06/27/23 - prior OBGYN care by Dr. Janit - referral to GYN to establish care

## 2024-01-02 ENCOUNTER — Telehealth (HOSPITAL_BASED_OUTPATIENT_CLINIC_OR_DEPARTMENT_OTHER): Payer: Self-pay | Admitting: *Deleted

## 2024-01-02 NOTE — Telephone Encounter (Addendum)
   Pre-operative Risk Assessment    Patient Name: Marie [name]  DOB: October 21, 1965 MRN: [id number]   Date of last office visit: 02/28/23 DR. O'NEAL  Date of next office visit: NONE   Request for Surgical Clearance    Procedure:  sarcal neuromodulation procedure   Date of Surgery:  Clearance TBD                                Surgeon:  DR. LIANNE GILLIS Surgeon's Group or Practice Name:  [facility] Phone number:  [phone] Fax number:  [phone]   Type of Clearance Requested:   - Medical  - Pharmacy:  Hold Clopidogrel  (Plavix )     Type of Anesthesia:  MAC   Additional requests/questions:    Bonney Niels Jest   01/02/2024, 9:04 AM    Letter  GILLIS LIANNE DASEN, MD on 01/01/2024     [facility] [address] [place], [place]  [address] Phone:  [phone]   Fax:  [phone]   January 01, 2024     Dear Dr. Barbaraann,   My name is LIANNE DASEN GILLIS, and I'm a Urogynecologist/female pelvic medicine and reconstructive surgery specialist at [facility].  I have been caring for our mutual patient, Marie [name].  She is planning surgery with me, and I'm hoping that you can help to provide a letter stating her risk stratification and possible medical optimization for surgery if she is deemed an appropriate surgical candidate.   Here are some details about her surgery: We are planning surgery for her pelvic organ prolapse and stress incontinence.   Her surgery will be a sarcal neuromodulation procedure that will last approximately 1 hour.   She will most likely undergo MAC pending anesthesia recommendations.   Her surgery will be outpatient.   Please provide any specific instructions that may help with her perioperative management, including management of anticoagulation if applicable.  Specifically, her plavix  use for history of heart failure and h/o CABG.   If you could send a letter to our office  [facility]) or to me through Epic, I would greatly appreciate it. Our contact information is above.   Sincerely,    LIANNE DASEN GILLIS, MD        [facility] at [facility]                           Marie [name], [id number]                                   1      ----- Message from [name] sent at 01/02/2024  8:07 AM EDT ----- Forwarding request to you.  Josefa CHRISTELLA. Cleaver NP-C  [image]   01/02/2024, 8:07 AM [facility] [address] Floor [place], [place] [address] Office [phone] ----- Message ----- From: Barbaraann Darryle Ned, MD Sent: 01/01/2024   4:53 PM EDT To: [facility]   ----- Message ----- From: GILLIS LIANNE DASEN, MD Sent: 01/01/2024   1:32 PM EDT To: Darryle Ned Barbaraann, MD

## 2024-01-02 NOTE — Telephone Encounter (Signed)
 Pt has been scheduled tele preop appt  01/08/24. Pt states the surgeon waiting on clearance before scheduling procedure. Med rec and consent are done. Pt has been given the [phone] that the preop APP will call from.

## 2024-01-02 NOTE — Telephone Encounter (Signed)
   Name: Marie [name]  DOB: 10/08/1965  MRN: [id number]  Primary Cardiologist: Darryle ONEIDA Decent, MD   Preoperative team, please contact this patient and set up a phone call appointment for further preoperative risk assessment. Please obtain consent and complete medication review. Thank you for your help.  I confirm that guidance regarding antiplatelet and oral anticoagulation therapy has been completed and, if necessary, noted below.  Her Plavix  may be held for 5 days prior to her procedure.  Please resume as soon as hemostasis is achieved.  I also confirmed the patient resides in the state of [place] . As per [facility] Medical Board telemedicine laws, the patient must reside in the state in which the provider is licensed.   [name], NP 01/02/2024, 9:23 AM  [facility]

## 2024-01-02 NOTE — Telephone Encounter (Signed)
 Pt has been scheduled tele preop appt  01/08/24. Pt states the surgeon waiting on clearance before scheduling procedure. Med rec and consent are done. Pt has been given the [phone] that the preop APP will call from.      Patient Consent for Virtual Visit        [name] has provided verbal consent on 01/02/2024 for a virtual visit (video or telephone).   CONSENT FOR VIRTUAL VISIT FOR:  Marie [name]  By participating in this virtual visit I agree to the following:  I hereby voluntarily request, consent and authorize [facility] and its employed or contracted physicians, physician assistants, nurse practitioners or other licensed health care professionals (the Practitioner), to provide me with telemedicine health care services (the "Services) as deemed necessary by the treating Practitioner. I acknowledge and consent to receive the Services by the Practitioner via telemedicine. I understand that the telemedicine visit will involve communicating with the Practitioner through live audiovisual communication technology and the disclosure of certain medical information by electronic transmission. I acknowledge that I have been given the opportunity to request an in-person assessment or other available alternative prior to the telemedicine visit and am voluntarily participating in the telemedicine visit.  I understand that I have the right to withhold or withdraw my consent to the use of telemedicine in the course of my care at any time, without affecting my right to future care or treatment, and that the Practitioner or I may terminate the telemedicine visit at any time. I understand that I have the right to inspect all information obtained and/or recorded in the course of the telemedicine visit and may receive copies of available information for a reasonable fee.  I understand that some of the potential risks of receiving the Services via telemedicine include:  Delay or  interruption in medical evaluation due to technological equipment failure or disruption; Information transmitted may not be sufficient (e.g. poor resolution of images) to allow for appropriate medical decision making by the Practitioner; and/or  In rare instances, security protocols could fail, causing a breach of personal health information.  Furthermore, I acknowledge that it is my responsibility to provide information about my medical history, conditions and care that is complete and accurate to the best of my ability. I acknowledge that Practitioner's advice, recommendations, and/or decision may be based on factors not within their control, such as incomplete or inaccurate data provided by me or distortions of diagnostic images or specimens that may result from electronic transmissions. I understand that the practice of medicine is not an exact science and that Practitioner makes no warranties or guarantees regarding treatment outcomes. I acknowledge that a copy of this consent can be made available to me via my patient portal [facility] MyChart), or I can request a printed copy by calling the office of  [facility].    I understand that my insurance will be billed for this visit.   I have read or had this consent read to me. I understand the contents of this consent, which adequately explains the benefits and risks of the Services being provided via telemedicine.  I have been provided ample opportunity to ask questions regarding this consent and the Services and have had my questions answered to my satisfaction. I give my informed consent for the services to be provided through the use of telemedicine in my medical care

## 2024-01-03 ENCOUNTER — Other Ambulatory Visit: Payer: Self-pay

## 2024-01-06 ENCOUNTER — Other Ambulatory Visit: Payer: Self-pay

## 2024-01-08 ENCOUNTER — Ambulatory Visit: Attending: Cardiovascular Disease | Admitting: Emergency Medicine

## 2024-01-08 DIAGNOSIS — Z01810 Encounter for preprocedural cardiovascular examination: Secondary | ICD-10-CM | POA: Diagnosis not present

## 2024-01-08 NOTE — Progress Notes (Signed)
 Virtual Visit via Telephone Note   Because of Marie [name] co-morbid illnesses, she is at least at moderate risk for complications without adequate follow up.  This format is felt to be most appropriate for this patient at this time.  Due to technical limitations with video connection (technology), today's appointment will be conducted as an audio only telehealth visit, and Marie [name] verbally agreed to proceed in this manner.   All issues noted in this document were discussed and addressed.  No physical exam could be performed with this format.  Evaluation Performed:  Preoperative cardiovascular risk assessment _____________   Date:  01/08/2024   Patient ID:  Marie [name], DOB 10/13/1965, MRN [id number] Patient Location:  Home Provider location:   Office  Primary Care Provider:  Pcp, No Primary Cardiologist:  Darryle ONEIDA Decent, MD  Chief Complaint / Patient Profile   58 y.o. y/o female with a h/o coronary artery disease s/p CABG, COPD, hypertension, type 2 diabetes, pulmonary hypertension who is pending sarcal neuromodulation procedure on date TBD with [facility] and presents today for telephonic preoperative cardiovascular risk assessment.  History of Present Illness    Marie [name] is a 58 y.o. female who presents via audio/video conferencing for a telehealth visit today.  Pt was last seen in cardiology clinic on 02/28/2023 by Dr. Decent.  At that time Marie [name] was doing well.  The patient is now pending procedure as outlined above. Since her last visit, she denies chest pain, shortness of breath, lower extremity edema, fatigue, palpitations, melena, hematuria, hemoptysis, diaphoresis, weakness, presyncope, syncope, orthopnea, and PND.  Today patient is doing well without acute cardiovascular concerns.  She denies any chest pains.  No changes to her chronic dyspnea on exertion due to her history of ongoing tobacco use and  COPD.  Continues to smoke about 2 packs/week.  She does stay active doing moderate housework and a fair amount of yard work.  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  Overall she is able to complete greater than 4 METS without exertional symptoms.  No symptoms to indicate active angina.  Past Medical History    Past Medical History:  Diagnosis Date   (HFpEF) heart failure with preserved ejection fraction (HCC)    a. 10/2018 EF >65% by SPECT.   Chronic pancreatitis (HCC)    Coronary atherosclerosis of unspecified type of vessel, native or graft    a. 09/2018 s/p CABG x 3; b. 07/2009 s/p PCI-->LCX; 10/2018 MV: Low risk w/ small/mild apical inf/lateral reversible defect - likley artifact. EF >65%.   Family history of pancreatic cancer    8/23 cancer genetic testing letter sent   Headache(784.0)    weekly (07/29/2013)   Hyperlipidemia LDL goal <70    Hypertension    Leiomyoma of uterus, unspecified    Loss of weight    Obesity, unspecified    OSA (obstructive sleep apnea)    no CPAP   Pulmonary sarcoidosis    Pure hypercholesterolemia    Shortness of breath    none since stent 2 yrs ago   Type II diabetes mellitus (HCC)    Past Surgical History:  Procedure Laterality Date   BREAST CYST ASPIRATION     does not remember which breast   BREAST EXCISIONAL BIOPSY     does not remember which breast  CORONARY ANGIOPLASTY WITH STENT PLACEMENT  09/2009   1   CORONARY ARTERY BYPASS GRAFT  ~ 2000   CABG X3   HYSTEROSCOPY WITH NOVASURE N/A 09/04/2012   Procedure: HYSTEROSCOPY WITH NOVASURE;  Surgeon: Winton Felt, MD;  Location: [facility];  Service: Gynecology;  Laterality: N/A;  with removal intrauterine device   LEEP N/A 12/25/2018   Procedure: LOOP ELECTROSURGICAL EXCISION PROCEDURE (LEEP);  Surgeon: Arloa Lamar SQUIBB, MD;  Location: [facility] OR[facility];  Service: Gynecology;  Laterality: N/A;   ROBOTIC ASSISTED LAPAROSCOPIC  CHOLECYSTECTOMY-MULTI SITE N/A 01/14/2018   Procedure: ROBOTIC ASSISTED LAPAROSCOPIC CHOLECYSTECTOMY-MULTI SITE;  Surgeon: Jordis Laneta FALCON, MD;  Location: [facility] ORS;  Service: General;  Laterality: N/A;   TUBAL LIGATION  1997    Allergies  Allergies  Allergen Reactions   Penicillins Other (See Comments)    Unknown    Lisinopril  Hives    Other reaction(s): hives/angiod    Home Medications    Prior to Admission medications   Medication Sig Start Date End Date Taking? Authorizing Provider  atorvastatin  (LIPITOR) 40 MG tablet Take 1 tablet (40 mg total) by mouth daily for cholesterol 02/06/23     clopidogrel  (PLAVIX ) 75 MG tablet Take 1 tablet (75 mg total) by mouth daily to thin blood 02/06/23   Auston Opal, DO  clopidogrel  (PLAVIX ) 75 MG tablet Take 1 tablet (75 mg total) by mouth daily. 08/22/23     conjugated estrogens (PREMARIN) vaginal cream Place 1 Applicatorful vaginally 2 (two) times a week. Place 0.5g nightly for two weeks then twice a week after 01/02/24   Guadlupe Dull T, MD  Fluticasone -Umeclidin-Vilant (TRELEGY ELLIPTA ) 200-62.5-25 MCG/ACT AEPB Inhale 1 puff into the lungs daily. 12/19/22   Kara Dorn NOVAK, MD  hydrochlorothiazide  (HYDRODIURIL ) 25 MG tablet Take 1 tablet (25 mg total) by mouth daily for blood pressure 02/06/23     HYDROcodone -acetaminophen  (NORCO/VICODIN) 5-325 MG tablet Take 1 tablet by mouth every 6 (six) hours as needed. 04/05/23     HYDROcodone -acetaminophen  (NORCO/VICODIN) 5-325 MG tablet Take 1 tablet by mouth every 6 (six) hours as needed. 03/07/23     HYDROcodone -acetaminophen  (NORCO/VICODIN) 5-325 MG tablet Take 1 tablet by mouth every 6 (six) hours as needed. 06/17/23     HYDROcodone -acetaminophen  (NORCO/VICODIN) 5-325 MG tablet Take 1 tablet by mouth every 6 (six) hours as needed. 05/21/23     HYDROcodone -acetaminophen  (NORCO/VICODIN) 5-325 MG tablet Take 1 tablet by mouth every 6 (six) hours as needed. 08/22/23     HYDROcodone -acetaminophen  (NORCO/VICODIN)  5-325 MG tablet Take 1 tablet by mouth every 6 (six) hours as needed. 08/22/23     HYDROcodone -acetaminophen  (NORCO/VICODIN) 5-325 MG tablet Take 1 tablet by mouth every 6 (six) hours as needed. 11/22/23     HYDROcodone -acetaminophen  (NORCO/VICODIN) 5-325 MG tablet Take 1 tablet by mouth every 6 (six) hours as needed. 11/22/23     HYDROcodone -acetaminophen  (NORCO/VICODIN) 5-325 MG tablet Take 1 tablet by mouth every 6 (six) hours as needed. 11/22/23     hydrocortisone  (ANUSOL -HC) 25 MG suppository Place 1 suppository (25 mg total) rectally daily. 12/06/23     isosorbide  mononitrate (IMDUR ) 30 MG 24 hr tablet Take 1 tablet (30 mg total) by mouth daily. 02/28/23 02/07/24  O'NealDarryle Ned, MD  losartan  (COZAAR ) 50 MG tablet Take 1 tablet (50 mg total) by mouth daily for blood pressure and diabetic kidney protection 02/06/23     metFORMIN  (GLUCOPHAGE ) 500 MG tablet Take 1 tablet (500 mg total) by mouth daily with breakfast for diabetes 02/06/23  metFORMIN  (GLUCOPHAGE ) 500 MG tablet Take 1 tablet (500 mg total) by mouth daily with breakfast. 08/22/23     metoprolol  tartrate (LOPRESSOR ) 50 MG tablet Take 1 tablet (50 mg total) by mouth daily with food for blood pressure and heart 02/06/23     Trospium  Chloride 60 MG CP24 Take 1 capsule (60 mg total) by mouth daily. 10/24/23   Guadlupe Lianne DASEN, MD    Physical Exam    Vital Signs:  Marie [name] does not have vital signs available for review today.  Given telephonic nature of communication, physical exam is limited. AAOx3. NAD. Normal affect.  Speech and respirations are unlabored.  Accessory Clinical Findings    None  Assessment & Plan    1.  Preoperative Cardiovascular Risk Assessment: According to the Revised Cardiac Risk Index (RCRI), her Perioperative Risk of Major Cardiac Event is (%): 0.9. Her Functional Capacity in METs is: 5.62 according to the Duke Activity Status Index (DASI).  Therefore, based on ACC/AHA guidelines, patient would be  at acceptable risk for the planned procedure without further cardiovascular testing. I will route this recommendation to the requesting party via Epic fax function.  The patient was advised that if she develops new symptoms prior to surgery to contact our office to arrange for a follow-up visit, and she verbalized understanding.  Her Plavix  may be held for 5 days prior to her procedure. Please resume as soon as hemostasis is achieved.   A copy of this note will be routed to requesting surgeon.  Time:   Today, I have spent 10 minutes with the patient with telehealth technology discussing medical history, symptoms, and management plan.     Marie LITTIE Louis, NP  01/08/2024, 1:31 PM

## 2024-01-14 ENCOUNTER — Other Ambulatory Visit: Payer: Self-pay

## 2024-01-23 NOTE — Progress Notes (Signed)
 [facility] Return Visit  SUBJECTIVE  History of Present Illness: Marie [name] is a 58 y.o. female seen in follow-up for mixed urinary incontinence, nocturia, tobacco use, abnormal urinalysis, vaginal atrophy, fecal incontinence . Plan at last visit was continue CPAP, switch hydrochlorothiazide  to 2pm, low dose vaginal estrogen from [facility].   Hold Plavix  for 5 days per cardiology.  Has not started tobacco cessation, reports 2 packs/day with cardiology visit  Reports using Norco 1-2 tabs/day.  Reports 1-2 glasses of wine/day.   Per Dr. Kara HARDING Score for Postoperative Pulmonary Complications patient is:  Intermediate risk  13.3% risk of in-hospital post-op pulmonary complications (composite including respiratory failure, respiratory infection, pleural effusion, atelectasis, pneumothorax, bronchospasm, aspiration pneumonitis)  Recommend using CPAP or Bipap in the PACU if needed as she wakes from anesthesia.  Prior visit: Gemtesa  cost prohibitive  Did not start low dose vaginal estrogen due to cost ($174/tube) Trospium  without relief Leaks 4-5 time(s) per days with activity, 2x/day since last visit Leaks 3x/week with urgency, now 1-2x/day Pad use: 3-4 pads/day.   Bowel leakage 2x/week, blood in stool evaluated by Jacqueline Thigpen, NP (GI) 12/06/23 and recommended Anusol , sitz baths, fiber supplementation, increase water intake and activity. Recommended follow-up in 3 months and possible repeat colonoscopy if persistent symptoms.  Pulmonary sarcoidosis with tobacco use Plavix  for HF with h/o CABG Chronic PRN Norco use by Dr. Auston for chronic pancreatitis and Creon  use. Tobacco use 1/2 pack/day  Urodynamic Impression 12/25/23:  1. Sensation was normal; capacity was reduced at 2. Stress Incontinence was not demonstrated at normal pressures with low volume; 3. 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 Compliance  7.61mL/cmH20  4.  Emptying was dysfunctional with a normal PVR 25mL, a sustained detrusor contraction present,  abdominal straining not present, normal urethral sphincter activity on EMG.  Pdet at Qmax was 65 cm of water.  Qmax was 9 mL/sec.  It was a interrupted pattern   HbA1C 6.9 per documentation in 02/28/23   Past Medical History: Patient  has a past medical history of (HFpEF) heart failure with preserved ejection fraction (HCC), Chronic pancreatitis (HCC), Coronary atherosclerosis of unspecified type of vessel, native or graft, Family history of pancreatic cancer, Headache(784.0), Hyperlipidemia LDL goal <70, Hypertension, Leiomyoma of uterus, unspecified, Loss of weight, Obesity, unspecified, OSA (obstructive sleep apnea), Pulmonary sarcoidosis, Pure hypercholesterolemia, Shortness of breath, and Type II diabetes mellitus (HCC).   Past Surgical History: She  has a past surgical history that includes Coronary artery bypass graft (~ 2000); Tubal ligation (1997); Hysteroscopy with novasure (N/A, 09/04/2012); Robotic assisted laparoscopic cholecystectomy-multi site (N/A, 01/14/2018); Coronary angioplasty with stent (09/2009); LEEP (N/A, 12/25/2018); Breast cyst aspiration; and Breast excisional biopsy.   Medications: She has a current medication list which includes the following prescription(s): atorvastatin , clopidogrel , clopidogrel , conjugated estrogens, trelegy ellipta , hydrochlorothiazide , hydrocodone -acetaminophen , hydrocodone -acetaminophen , hydrocodone -acetaminophen , hydrocodone -acetaminophen , hydrocodone -acetaminophen , hydrocodone -acetaminophen , hydrocodone -acetaminophen , hydrocodone -acetaminophen , hydrocodone -acetaminophen , hydrocortisone , isosorbide  mononitrate, losartan , metformin , metformin , metoprolol  tartrate, and trospium  chloride.   Allergies: Patient is allergic to penicillins and lisinopril .   Social History: Patient  reports that she has been smoking cigarettes. She has a 16 pack-year smoking  history. She has never used smokeless tobacco. She reports current alcohol use of about 8.0 standard drinks of alcohol per week. She reports that she does not use drugs.     OBJECTIVE     Physical Exam: Vitals:   01/24/24 1019  BP: 131/85  Pulse: (!) 106    Gen: No apparent distress, A&O x 3.  Detailed Urogynecologic  Evaluation:  Deferred.       ASSESSMENT AND PLAN    Marie [name] is a 58 y.o. with:  1. Urinary incontinence, mixed   2. Incontinence of feces, unspecified fecal incontinence type   3. Tobacco use   4. Other chronic pain   5. Alcohol use   6. CAD in native artery   7. Alcohol-induced chronic pancreatitis (HCC)      Urinary incontinence, mixed Assessment & Plan: - prior bladder tack with resolution of urinary leakage > 15 years ago by Dr. Eveline - We discussed the symptoms of overactive bladder (OAB), which include urinary urgency, urinary frequency, nocturia, with or without urge incontinence.  While we do not know the exact etiology of OAB, several treatment options exist. We discussed management including behavioral therapy (decreasing bladder irritants, urge suppression strategies, timed voids, bladder retraining), physical therapy, medication; for refractory cases posterior tibial nerve stimulation, sacral neuromodulation, and intravesical botulinum toxin injection.  For anticholinergic medications, we discussed the potential side effects of anticholinergics including dry eyes, dry mouth, constipation, cognitive impairment and urinary retention. For Beta-3 agonist medication, we discussed the potential side effect of elevated blood pressure which is more likely to occur in individuals with uncontrolled hypertension. - trial of gemtesa  with samples provided. Rx changed to Trospium  due to cost without relief - discouraged Kegel exercises - For treatment of stress urinary incontinence,  non-surgical options include expectant management, weight loss, physical  therapy, as well as a pessary.  Surgical options include a midurethral sling, Burch urethropexy, and transurethral injection of a bulking agent. - trial of low dose vaginal estrogen cost prohibitive, Rx premarin and provided instructions for [facility] - urodynamics due to history of bladder tack showed reduced MCC , no SUI, DOI, pressure flow with 25mL PVR. Pdet at Qmax was 65 cm of water.  Qmax was 9 mL/sec.  Interrupted pattern For refractory OAB we reviewed the procedure for intravesical Botox injection with cystoscopy in the office and reviewed the risks, benefits and alternatives of treatment including but not limited to infection, need for self-catheterization and need for repeat therapy.  We discussed that there is a 5-15% chance of needing to catheterize with Botox and that this usually resolves in a few months; however can persist for longer periods of time.  Typically Botox injections would need to be repeated every 3-12 months since this is not a permanent therapy.   We discussed the role of sacral neuromodulation and how it works. It requires a test phase, and documentation of bladder function via diary. After a successful test period, a permanent wire and generator are placed in the OR. The battery lasts 5 years on average and would need to be replaced surgically.  The goal of this therapy is at least a 50% improvement in symptoms. It is NOT realistic to expect a 100% cure.  We reviewed the fact that about 30% of patients fail the test phase and are not candidates for permanent generator placement.  We discussed the risk of infection and that the patient would have to switch to MRI compatible mode during imaging once the device is placed. There are two companies that provide this therapy: [employer] and [employer] that both offer rechargeable or non-rechargeable options. For all procedures, we discussed risks of bleeding, infection, damage to surrounding organs including bowel, bladder, blood  vessels, ureters and nerves, need for further surgery, risk of postoperative urinary incontinence or retention with need to catheterize, recurrent prolapse, numbness and weakness at any body  site, buttock pain, and the rarer risks of blood clot, heart attack, pneumonia, death.    We also discussed the role of percutaneous tibial nerve stimulation and how it works.  She understands it requires 12 weekly visits for temporary neuromodulation of the sacral nerve roots via the tibial nerve and that she may then require continued tapered treatment.  - declines botox injections, desires to proceed with PNE. Completed perioperative risk assessment with cardiology and pulm, tobacco cessation - pending anesthesia preop risk assessment and GI workup prior to procedure.    Incontinence of feces, unspecified fecal incontinence type Assessment & Plan: - h/o chronic pancreatitis with Creon  use, EtOH use, metformin  and opioid use, h/o SBO - unintended weight loss, blood in stool, tobacco use - Bristol VI stool - Treatment options include anti-diarrhea medication (loperamide/ Imodium OTC or prescription lomotil), fiber supplements, physical therapy, and possible sacral neuromodulation or surgery.   - encouraged fiber supplementation and follow-up with GI Dr. Dianna in 3 month for reassessment of blood in stool and possible repeat colonoscopy if persistent symptoms - trial of Trospium  without relief - encouraged Kegel exercises - pt desire to proceed with PNE, will require preoperative risk assessment with anesthesia prior to scheduling procedure    Tobacco use Assessment & Plan: - 1/2 pack/day - encouraged tobacco cessation due to CHF, h/o CABG - discussed association with cough and stress urinary leakage - We recommend tobacco cessation for 4-6 weeks prior to surgery to reduce surgical site infection, heart/lung complications, and risk of delayed wound healing. - pt reports that she will start tobacco  cessation - reviewed 13.3% risk of postoperative pulm complications, recommended CPAP or Bipap in the PACU per Dr. Kara    Other chronic pain Assessment & Plan: - PRN Norco use #30/month for chronic pancreatitis, reports 1-2 tabs/day - discussed risk of sub-optimal perioperative pain control that may require adjustment of baseline pain medication  - will proceed with ERAS protocol for multimodal pain mgmt   Alcohol use Assessment & Plan: - 1-2 glasses of wine/day - encouraged reduction of EtOH use prior to surgery due to increased risk of perioperative complications such as infections, cardio/pulm issues, bleeding, delayed healing, and postoperative delirium.  - recommended avoiding alcohol use for 2 weeks prior to procedure and encouraged to reduce intake   CAD in native artery Assessment & Plan: - underwent cardiology preoperative risk assessment - recommended holding Plavix  for 5 days prior to procedure - encouraged tobacco cessation due to increased risk of VTE and CV/pulm complications - CBC Preop    Alcohol-induced chronic pancreatitis (HCC) Assessment & Plan: - 1-2 glasses of wine/day - LFTs preop - takes Norco 1-2 tabs/day for pain    Discussed with patient need to assess perioperative risks prior to scheduling procedure. Completed evaluation by Dr. Barbaraann (cardiology) and Dr. Kara (pulm). Will require anesthesia consult if she is a surgical candidate. Assess LFTs and CBC.  Time spent: I spent 22 minutes dedicated to the care of this patient on the date of this encounter to include pre-visit review of records, face-to-face time with the patient discussing mixed urinary incontinence, fecal incontinence, vaginal atrophy, nocturia, alcohol use, chronic pain, abnormal urinalysis, and post visit documentation and ordering medication/ testing.   Lianne ONEIDA Gillis, MD

## 2024-01-24 ENCOUNTER — Ambulatory Visit: Admitting: Obstetrics

## 2024-01-24 ENCOUNTER — Encounter: Payer: Self-pay | Admitting: Obstetrics

## 2024-01-24 VITALS — BP 131/85 | HR 106

## 2024-01-24 DIAGNOSIS — R159 Full incontinence of feces: Secondary | ICD-10-CM

## 2024-01-24 DIAGNOSIS — F1090 Alcohol use, unspecified, uncomplicated: Secondary | ICD-10-CM

## 2024-01-24 DIAGNOSIS — N3946 Mixed incontinence: Secondary | ICD-10-CM

## 2024-01-24 DIAGNOSIS — I2510 Atherosclerotic heart disease of native coronary artery without angina pectoris: Secondary | ICD-10-CM

## 2024-01-24 DIAGNOSIS — G8929 Other chronic pain: Secondary | ICD-10-CM

## 2024-01-24 DIAGNOSIS — K860 Alcohol-induced chronic pancreatitis: Secondary | ICD-10-CM

## 2024-01-24 DIAGNOSIS — Z720 Tobacco use: Secondary | ICD-10-CM

## 2024-01-24 NOTE — Patient Instructions (Signed)
 Please reduce your tobacco use and alcohol use prior to percutaneous nerve evaluation.   We will contact you regarding scheduling of nerve testing.

## 2024-01-25 NOTE — Assessment & Plan Note (Addendum)
-   1-2 glasses of wine/day - LFTs preop - takes Norco 1-2 tabs/day for pain

## 2024-01-25 NOTE — Assessment & Plan Note (Signed)
-   1-2 glasses of wine/day - encouraged reduction of EtOH use prior to surgery due to increased risk of perioperative complications such as infections, cardio/pulm issues, bleeding, delayed healing, and postoperative delirium.  - recommended avoiding alcohol use for 2 weeks prior to procedure and encouraged to reduce intake

## 2024-01-25 NOTE — Assessment & Plan Note (Signed)
 - prior bladder tack with resolution of urinary leakage > 15 years ago by Dr. Eveline - We discussed the symptoms of overactive bladder (OAB), which include urinary urgency, urinary frequency, nocturia, with or without urge incontinence.  While we do not know the exact etiology of OAB, several treatment options exist. We discussed management including behavioral therapy (decreasing bladder irritants, urge suppression strategies, timed voids, bladder retraining), physical therapy, medication; for refractory cases posterior tibial nerve stimulation, sacral neuromodulation, and intravesical botulinum toxin injection.  For anticholinergic medications, we discussed the potential side effects of anticholinergics including dry eyes, dry mouth, constipation, cognitive impairment and urinary retention. For Beta-3 agonist medication, we discussed the potential side effect of elevated blood pressure which is more likely to occur in individuals with uncontrolled hypertension. - trial of gemtesa  with samples provided. Rx changed to Trospium  due to cost without relief - discouraged Kegel exercises - For treatment of stress urinary incontinence,  non-surgical options include expectant management, weight loss, physical therapy, as well as a pessary.  Surgical options include a midurethral sling, Burch urethropexy, and transurethral injection of a bulking agent. - trial of low dose vaginal estrogen cost prohibitive, Rx premarin and provided instructions for [facility] - urodynamics due to history of bladder tack showed reduced MCC , no SUI, DOI, pressure flow with 25mL PVR. Pdet at Qmax was 65 cm of water.  Qmax was 9 mL/sec.  Interrupted pattern For refractory OAB we reviewed the procedure for intravesical Botox injection with cystoscopy in the office and reviewed the risks, benefits and alternatives of treatment including but not limited to infection, need for self-catheterization and need for repeat therapy.  We  discussed that there is a 5-15% chance of needing to catheterize with Botox and that this usually resolves in a few months; however can persist for longer periods of time.  Typically Botox injections would need to be repeated every 3-12 months since this is not a permanent therapy.   We discussed the role of sacral neuromodulation and how it works. It requires a test phase, and documentation of bladder function via diary. After a successful test period, a permanent wire and generator are placed in the OR. The battery lasts 5 years on average and would need to be replaced surgically.  The goal of this therapy is at least a 50% improvement in symptoms. It is NOT realistic to expect a 100% cure.  We reviewed the fact that about 30% of patients fail the test phase and are not candidates for permanent generator placement.  We discussed the risk of infection and that the patient would have to switch to MRI compatible mode during imaging once the device is placed. There are two companies that provide this therapy: [employer] and [employer] that both offer rechargeable or non-rechargeable options. For all procedures, we discussed risks of bleeding, infection, damage to surrounding organs including bowel, bladder, blood vessels, ureters and nerves, need for further surgery, risk of postoperative urinary incontinence or retention with need to catheterize, recurrent prolapse, numbness and weakness at any body site, buttock pain, and the rarer risks of blood clot, heart attack, pneumonia, death.    We also discussed the role of percutaneous tibial nerve stimulation and how it works.  She understands it requires 12 weekly visits for temporary neuromodulation of the sacral nerve roots via the tibial nerve and that she may then require continued tapered treatment.  - declines botox injections, desires to proceed with PNE. Completed perioperative risk assessment with cardiology and pulm, tobacco  cessation - pending anesthesia  preop risk assessment and GI workup prior to procedure.

## 2024-01-25 NOTE — Assessment & Plan Note (Signed)
-   PRN Norco use #30/month for chronic pancreatitis, reports 1-2 tabs/day - discussed risk of sub-optimal perioperative pain control that may require adjustment of baseline pain medication  - will proceed with ERAS protocol for multimodal pain mgmt

## 2024-01-25 NOTE — Assessment & Plan Note (Signed)
-   1/2 pack/day - encouraged tobacco cessation due to CHF, h/o CABG - discussed association with cough and stress urinary leakage - We recommend tobacco cessation for 4-6 weeks prior to surgery to reduce surgical site infection, heart/lung complications, and risk of delayed wound healing. - pt reports that she will start tobacco cessation - reviewed 13.3% risk of postoperative pulm complications, recommended CPAP or Bipap in the PACU per Dr. Kara

## 2024-01-25 NOTE — Assessment & Plan Note (Signed)
-   h/o chronic pancreatitis with Creon  use, EtOH use, metformin  and opioid use, h/o SBO - unintended weight loss, blood in stool, tobacco use - Bristol VI stool - Treatment options include anti-diarrhea medication (loperamide/ Imodium OTC or prescription lomotil), fiber supplements, physical therapy, and possible sacral neuromodulation or surgery.   - encouraged fiber supplementation and follow-up with GI Dr. Dianna in 3 month for reassessment of blood in stool and possible repeat colonoscopy if persistent symptoms - trial of Trospium  without relief - encouraged Kegel exercises - pt desire to proceed with PNE, will require preoperative risk assessment with anesthesia prior to scheduling procedure

## 2024-01-25 NOTE — Assessment & Plan Note (Addendum)
-   underwent cardiology preoperative risk assessment - recommended holding Plavix  for 5 days prior to procedure - encouraged tobacco cessation due to increased risk of VTE and CV/pulm complications - CBC Preop

## 2024-02-12 ENCOUNTER — Other Ambulatory Visit: Payer: Self-pay

## 2024-02-13 ENCOUNTER — Other Ambulatory Visit: Payer: Self-pay

## 2024-02-13 MED ORDER — HYDROCODONE-ACETAMINOPHEN 5-325 MG PO TABS
1.0000 | ORAL_TABLET | Freq: Four times a day (QID) | ORAL | 0 refills | Status: DC | PRN
  Filled 2024-02-13 (×2): qty 56, 14d supply, fill #0

## 2024-02-24 ENCOUNTER — Other Ambulatory Visit: Payer: Self-pay

## 2024-02-24 MED ORDER — ISOSORBIDE DINITRATE 30 MG PO TABS
30.0000 mg | ORAL_TABLET | Freq: Two times a day (BID) | ORAL | 1 refills | Status: DC
  Filled 2024-02-24: qty 60, 30d supply, fill #0

## 2024-02-24 MED ORDER — METFORMIN HCL 500 MG PO TABS
500.0000 mg | ORAL_TABLET | Freq: Every day | ORAL | 1 refills | Status: AC
  Filled 2024-02-24: qty 30, 30d supply, fill #0

## 2024-02-24 MED ORDER — HYDROCHLOROTHIAZIDE 25 MG PO TABS
25.0000 mg | ORAL_TABLET | Freq: Every day | ORAL | 1 refills | Status: DC
  Filled 2024-02-24: qty 30, 30d supply, fill #0

## 2024-02-24 MED ORDER — CLOPIDOGREL BISULFATE 75 MG PO TABS
75.0000 mg | ORAL_TABLET | Freq: Every day | ORAL | 1 refills | Status: AC
  Filled 2024-02-24: qty 30, 30d supply, fill #0

## 2024-02-24 MED ORDER — METOPROLOL TARTRATE 50 MG PO TABS
50.0000 mg | ORAL_TABLET | Freq: Every day | ORAL | 1 refills | Status: DC
  Filled 2024-02-24: qty 30, 30d supply, fill #0

## 2024-02-24 MED ORDER — ATORVASTATIN CALCIUM 40 MG PO TABS
40.0000 mg | ORAL_TABLET | Freq: Every day | ORAL | 1 refills | Status: AC
  Filled 2024-02-24: qty 30, 30d supply, fill #0

## 2024-02-24 MED ORDER — LOSARTAN POTASSIUM 50 MG PO TABS
50.0000 mg | ORAL_TABLET | Freq: Every day | ORAL | 1 refills | Status: AC
  Filled 2024-02-24: qty 30, 30d supply, fill #0

## 2024-02-24 MED ORDER — HYDROCODONE-ACETAMINOPHEN 5-325 MG PO TABS
1.0000 | ORAL_TABLET | Freq: Every day | ORAL | 0 refills | Status: DC
  Filled 2024-03-12: qty 30, 30d supply, fill #0

## 2024-02-24 MED ORDER — HYDROCODONE-ACETAMINOPHEN 5-325 MG PO TABS: 1.0000 | ORAL_TABLET | Freq: Every day | ORAL | 0 refills | Status: DC

## 2024-02-24 MED ORDER — HYDROCODONE-ACETAMINOPHEN 5-325 MG PO TABS
1.0000 | ORAL_TABLET | Freq: Every day | ORAL | 0 refills | Status: AC
  Filled 2024-04-09: qty 30, 30d supply, fill #0

## 2024-02-28 ENCOUNTER — Other Ambulatory Visit: Payer: Self-pay

## 2024-03-04 NOTE — Progress Notes (Unsigned)
 " Cardiology Office Note:  .   Date:  03/13/2024  ID:  Marie [name], DOB Jul 24, 1965, MRN [id number] PCP: Freddrick Johns  [facility] Providers Cardiologist:  Darryle ONEIDA Decent, MD   History of Present Illness: .    Chief Complaint  Patient presents with   Follow-up    Marie [name] is a 58 y.o. female with below history who presents for follow-up.   History of Present Illness   Marie [name] is a 58 year old female with coronary artery disease and severe COPD who presents for follow-up.  She has a history of coronary artery disease with a CABG in 2000 and PCI to the circumflex in 2011. She experiences chest tightness with overexertion, such as during a recent trip where she had to slow her pace while walking. Her last stress test was two years ago and was reported as good. She is currently on Lipitor 40 mg and Plavix , with satisfactory cholesterol levels.  She has severe COPD and sometimes gets out of breath while talking on the phone. She continues to smoke despite her significant COPD. Her oxygen  levels are typically low, which she associates with her COPD. She has transitioned from using a CPAP to a DPAP, which she finds more effective. She is under the care of Dr. Kara for her lung condition.  She mentions a slight sore throat and symptoms of a mild cold, including a cough and congestion, which she attributes to the cold. She denies chest pain at rest but does report chest tightness with exertion and sometimes gets out of breath while talking on the phone. Her blood pressure was noted to be low at 110/70, and she has not taken her medications yet today. Her blood pressure readings at home have been normal, around 132/80, although she has not checked it this month. She is currently taking metoprolol  and hydrochlorothiazide , among other medications.  She has diabetes with an A1c of 7.0. No specific symptoms related to her diabetes were reported during this visit.            Problem List CAD s/p CABG -CABG x 3 09/1998 @ [facility]  -PCI LCX 07/2009 -NM stress 11/06/2021 nml 2. HTN 3. DM -A1c 7.0 4. HLD -T chol 119, LDL 59, HDL 58, TG 46 5. Pulmonary sarcoidosis  6. Etoh  7. Chronic pancreatitis  8. COPD/pHTN -moderate obstructive  -severe diffusion defect  9. OSA     ROS: All other ROS reviewed and negative. Pertinent positives noted in the HPI.     Studies Reviewed: SABRA   EKG Interpretation Date/Time:  Friday March 13 2024 09:14:46 EST Ventricular Rate:  108 PR Interval:  148 QRS Duration:  78 QT Interval:  402 QTC Calculation: 538 R Axis:   127  Text Interpretation: Sinus tachycardia Possible Left atrial enlargement Right axis deviation Septal infarct (cited on or before 13-Nov-2022) T wave abnormality, consider inferior ischemia T wave abnormality, consider anterolateral ischemia Prolonged QT Confirmed by Decent Darryle [phone]) on 03/13/2024 9:22:04 AM    NM Stress 11/06/2021   Nuclear stress EF: 68 %. The left ventricular ejection fraction is hyperdynamic (>65%).   Normal sinus rhythm with no electrocardiographic changes.   There is normal radiotracer uptake at both rest and stress.  No ischemia, no infarction.   Low risk nuclear stress test.   Physical Exam:   VS:  BP 110/70   Pulse (!) 108   Ht 5' 2 (1.575 m)   Wt 101 lb  12.8 oz (46.2 kg)   SpO2 92%   BMI 18.62 kg/m    Wt Readings from Last 3 Encounters:  03/13/24 101 lb 12.8 oz (46.2 kg)  08/01/23 103 lb (46.7 kg)  07/02/23 100 lb 3.2 oz (45.5 kg)    GEN: Well nourished, well developed in no acute distress NECK: No JVD; No carotid bruits CARDIAC: RRR, no murmurs, rubs, gallops RESPIRATORY:  Clear to auscultation without rales, wheezing or rhonchi  ABDOMEN: Soft, non-tender, non-distended EXTREMITIES:  No edema; No deformity  ASSESSMENT AND PLAN: .   Assessment and Plan    Coronary artery disease status post CABG and PCI Coronary artery disease with history of CABG and PCI.  No current chest pain, but exertional chest tightness. EKG shows diffuse T wave inversions. Symptoms may be COPD-related. LDL well-controlled. - Echo ordered given change in EKG. No symptoms reported.  - Continue Lipitor 40 mg. - Continue Plavix .  Severe chronic obstructive pulmonary disease with hypoxemia Severe COPD with hypoxemia. Oxygen  saturation improved from mid-80s to 92%. Shortness of breath and chest tightness likely COPD-related. Continues smoking, exacerbating symptoms. - Referred to pulmonologist Dr. Kara.  Primary hypertension Blood pressure 110/70 mmHg, slightly low. Home readings normal. Discontinuing losartan , Imdur , and HCTZ due to low blood pressure. Continuing metoprolol . - Discontinued losartan , Imdur , and HCTZ. - Continue metoprolol  50 mg BID. - Instructed to check blood pressure daily.  Hyperlipidemia Well-controlled with LDL at 59 mg/dL on Lipitor 40 mg. - Continue Lipitor 40 mg.  Type 2 diabetes mellitus A1c is 7.0%.  Tobacco use disorder Continues smoking, contributing to COPD exacerbation and health risks.                Follow-up: Return in about 3 months (around 06/11/2024).  Signed, Darryle DASEN. Barbaraann, MD, [facility]  [facility]  [address] [place], [place] [address] [phone]  9:38 AM   "

## 2024-03-05 ENCOUNTER — Other Ambulatory Visit: Payer: Self-pay

## 2024-03-12 ENCOUNTER — Other Ambulatory Visit: Payer: Self-pay

## 2024-03-13 ENCOUNTER — Telehealth: Payer: Self-pay

## 2024-03-13 ENCOUNTER — Ambulatory Visit: Attending: Cardiovascular Disease | Admitting: Cardiovascular Disease

## 2024-03-13 ENCOUNTER — Encounter: Payer: Self-pay | Admitting: Cardiovascular Disease

## 2024-03-13 VITALS — BP 110/70 | HR 108 | Ht 62.0 in | Wt 101.8 lb

## 2024-03-13 DIAGNOSIS — I10 Essential (primary) hypertension: Secondary | ICD-10-CM

## 2024-03-13 DIAGNOSIS — Z720 Tobacco use: Secondary | ICD-10-CM

## 2024-03-13 DIAGNOSIS — E785 Hyperlipidemia, unspecified: Secondary | ICD-10-CM | POA: Diagnosis not present

## 2024-03-13 DIAGNOSIS — I25810 Atherosclerosis of coronary artery bypass graft(s) without angina pectoris: Secondary | ICD-10-CM | POA: Diagnosis not present

## 2024-03-13 DIAGNOSIS — R9431 Abnormal electrocardiogram [ECG] [EKG]: Secondary | ICD-10-CM | POA: Diagnosis not present

## 2024-03-13 DIAGNOSIS — R0602 Shortness of breath: Secondary | ICD-10-CM | POA: Diagnosis not present

## 2024-03-13 NOTE — Telephone Encounter (Signed)
 Spoke with patient have her scheduled

## 2024-03-13 NOTE — Telephone Encounter (Unsigned)
 Copied from CRM #[id number]. Topic: Appointments - Scheduling Inquiry for Clinic >> Mar 13, 2024 11:56 AM Isabell A wrote: Reason for CRM: Patient returning phone call from [facility] to schedule for renewal of her O2 - attempt to schedule no availability until February, previous encounter stated there may be availability sooner.    Callback number: [phone]

## 2024-03-13 NOTE — Telephone Encounter (Signed)
 X1  LM/ VM to return call (noted that it is to renew O2 to schedule).

## 2024-03-13 NOTE — Telephone Encounter (Signed)
 Tried to reach out to patient we need to schedule an appointment to renew her O2 - this week  can us  sameday appointment if available

## 2024-03-13 NOTE — Patient Instructions (Signed)
 Medication Instructions:   STOP TAKING:  LOSARTAN   IMDUR  HCTZ  *If you need a refill on your cardiac medications before your next appointment, please call your pharmacy*   Lab Work: NONE ORDERED  TODAY     If you have labs (blood work) drawn today and your tests are completely normal, you will receive your results only by: MyChart Message (if you have MyChart) OR A paper copy in the mail If you have any lab test that is abnormal or we need to change your treatment, we will call you to review the results.    Testing/Procedures:  Your physician has requested that you have an echocardiogram. Echocardiography is a painless test that uses sound waves to create images of your heart. It provides your doctor with information about the size and shape of your heart and how well your hearts chambers and valves are working. This procedure takes approximately one hour. There are no restrictions for this procedure. Please do NOT wear cologne, perfume, aftershave, or lotions (deodorant is allowed). Please arrive 15 minutes prior to your appointment time.  Please note: We ask at that you not bring children with you during ultrasound (echo/ vascular) testing. Due to room size and safety concerns, children are not allowed in the ultrasound rooms during exams. Our front office staff cannot provide observation of children in our lobby area while testing is being conducted. An adult accompanying a patient to their appointment will only be allowed in the ultrasound room at the discretion of the ultrasound technician under special circumstances. We apologize for any inconvenience.     Follow-Up: At [facility], you and your health needs are our priority.  As part of our continuing mission to provide you with exceptional heart care, our providers are all part of one team.  This team includes your primary Cardiologist (physician) and Advanced Practice Providers or APPs (Physician Assistants and  Nurse Practitioners) who all work together to provide you with the care you need, when you need it.  Your next appointment:   3 month(s)  Provider:   Darryle ONEIDA Decent, MD    We recommend signing up for the patient portal called MyChart.  Sign up information is provided on this After Visit Summary.  MyChart is used to connect with patients for Virtual Visits (Telemedicine).  Patients are able to view lab/test results, encounter notes, upcoming appointments, etc.  Non-urgent messages can be sent to your provider as well.   To learn more about what you can do with MyChart, go to [email or web address].   Other Instructions

## 2024-03-16 ENCOUNTER — Telehealth: Payer: Self-pay

## 2024-03-16 NOTE — Telephone Encounter (Signed)
 Patient has been scheduled. NFN

## 2024-03-16 NOTE — Telephone Encounter (Signed)
-----   Message from Dorn Chill, MD sent at 03/13/2024 11:42 AM EST ----- Regarding: RE: Low O2 Thanks Wes, will get her in.   Front Office Team and [place], patient is due for follow up with us  and will need evaluation for supplemental O2.  Thanks, JD ----- Message ----- From: Barbaraann Darryle Ned, MD Sent: 03/13/2024   9:39 AM EST To: Dorn KATHEE Chill, MD Subject: Low O2                                         Jon:  She came in for routine visit with sats ~84%. Improved at rest to 92%. I suspect she may need O2 at home. She just has exertional SOB. Can you guys get her back in for follow-up?  -Wes

## 2024-03-24 ENCOUNTER — Ambulatory Visit: Admitting: Obstetrics

## 2024-03-24 ENCOUNTER — Encounter: Payer: Self-pay | Admitting: Obstetrics

## 2024-03-24 VITALS — BP 114/76 | HR 79

## 2024-03-24 DIAGNOSIS — N3946 Mixed incontinence: Secondary | ICD-10-CM

## 2024-03-24 DIAGNOSIS — N3281 Overactive bladder: Secondary | ICD-10-CM | POA: Diagnosis not present

## 2024-03-24 MED ORDER — LIDOCAINE-EPINEPHRINE 1 %-1:100000 IJ SOLN: 20.0000 mL | Freq: Once | INTRAMUSCULAR | Status: AC

## 2024-03-24 MED ORDER — SODIUM BICARBONATE 8.4 % IV SOLN: 2.0000 mL | Freq: Once | INTRAVENOUS | Status: AC

## 2024-03-24 NOTE — Patient Instructions (Addendum)
 Taking Care of Yourself after Percutaneous Nerve Evaluation    For the first 1-2 days after the procedure, you may have some discomfort or discharge from lead placement. Excessive bleeding or abnormal discharge is NOT normal. Call the nurse line if this happens or go to the nearest Emergency Room if the bleeding is heavy or if there are signs and symptoms of infection.    You may return to normal daily activities such as work, school, driving, exercising and housework on the day of the procedure. Please do not shower or bath while lead is in place.   Please return with completed bladder diary within 7 days for follow-up.   Resume plavix  if you do not experience excessive bleeding tomorrow.   You will have to hold 5 days prior to your next schedule procedure.

## 2024-03-24 NOTE — Addendum Note (Signed)
 Addended byBETHA GUADLUPE DULL T on: 03/24/2024 03:33 PM   Modules accepted: Orders

## 2024-03-24 NOTE — Progress Notes (Signed)
 Interstim Basic Evaluation (PNE) Procedure   Procedure: Sacral Neuromodulator percutaneous sacral stimulation test using bony landmarks. Procedure repeated contralaterally for bilateral test lead placement.    Indication: 58yo @ F with diagnosis of refractory overactive bladder. Informed consent has been obtained.   Held Plavix  for 5 days Has not started tobacco cessation, reports 2 packs/day with cardiology visit  Reports using Norco 1-2 tabs/day.  Reports 1-2 glasses of wine/day.    Per Dr. Kara HARDING Score for Postoperative Pulmonary Complications patient is:  Intermediate risk  13.3% risk of in-hospital post-op pulmonary complications (composite including respiratory failure, respiratory infection, pleural effusion, atelectasis, pneumothorax, bronchospasm, aspiration pneumonitis)  Recommend using CPAP or Bipap in the PACU if needed as she wakes from anesthesia.   Prior visit: Gemtesa  cost prohibitive  Did not start low dose vaginal estrogen due to cost ($174/tube) Trospium  without relief Leaks 4-5 time(s) per days with activity, 2x/day since last visit Leaks 3x/week with urgency, now 1-2x/day Pad use: 3-4 pads/day.   Bowel leakage 2x/week, blood in stool evaluated by Jacqueline Thigpen, NP (GI) 12/06/23 and recommended Anusol , sitz baths, fiber supplementation, increase water intake and activity. Recommended follow-up in 3 months and possible repeat colonoscopy if persistent symptoms.  Pulmonary sarcoidosis with tobacco use Plavix  for HF with h/o CABG Chronic PRN Norco use by Dr. Auston for chronic pancreatitis and Creon  use. Tobacco use 1/2 pack/day   Description:  Patient was properly identified and placed in the prone position. Patient was positioned to allow the toes to dangle freely. A grounding pad was placed on the bottom of the patient's foot and the cable was connected  to the grounding pad and external stimulation box. The patient was then prepped and draped in the  usual sterile fashion. 9cm was measured from the tip of the coccyx superiorly in the midline, then 2cm superiorly and 2cm laterally to mark the foramen bilaterally. Local injection of 20ml 1% lidocaine  with epinephrine  with 2ml of sodium bicarbonate was injected at the planned entry site bilaterally.    The foramen needle was placed at a 60 degree angle into the right S3 foramen without difficulty. The proper S3 position was confirmed by the patient's sensation to stimulation utilizing the external test stimulator. This was repeated on the left side. The needle stylet was removed and the percutaneous lead was inserted. Using a push and pull technique, the needle was taken out over the lead. This was then repeated on the opposite side.    The patient cables were connected to the leads and grounding pads. The patient was cleaned off and the entire region was covered with tegaderms to secure the leads. The estimated blood loss was negligible. The patient tolerated the procedure well with no complications. Using the external test stimulator, the patient was programmed to the optimum sensation via the lead, and given instructions. Urinary diary will be kept until the return appointment. She was given post-procedure instructions and precautions to call the office with any concerns.    Return 1 week for follow up and lead removal.     Marie ONEIDA Gillis, MD

## 2024-03-30 ENCOUNTER — Ambulatory Visit (INDEPENDENT_AMBULATORY_CARE_PROVIDER_SITE_OTHER): Admitting: Obstetrics

## 2024-03-30 ENCOUNTER — Encounter: Payer: Self-pay | Admitting: Obstetrics

## 2024-03-30 VITALS — BP 130/86 | HR 89

## 2024-03-30 DIAGNOSIS — Z720 Tobacco use: Secondary | ICD-10-CM | POA: Diagnosis not present

## 2024-03-30 DIAGNOSIS — R351 Nocturia: Secondary | ICD-10-CM | POA: Diagnosis not present

## 2024-03-30 DIAGNOSIS — N3946 Mixed incontinence: Secondary | ICD-10-CM | POA: Diagnosis not present

## 2024-03-30 DIAGNOSIS — R159 Full incontinence of feces: Secondary | ICD-10-CM | POA: Diagnosis not present

## 2024-03-30 NOTE — Patient Instructions (Signed)
 We will proceed with combined Stage I and II interstim on 04/18/23

## 2024-03-30 NOTE — Progress Notes (Signed)
 [name] Return Visit  SUBJECTIVE  History of Present Illness: Marie [name] is a 59 y.o. female seen in follow-up for mixed urinary incontinence, nocturia, tobacco use, abnormal urinalysis, vaginal atrophy, fecal incontinence . Plan at last visit was continue CPAP, switch hydrochlorothiazide  to 2pm, low dose vaginal estrogen from [employer].   Patient reports around 60% improvement since PNE on 03/24/24 Baseline 7 voids/day, 3 voids/night, degree or urgency 4, leaks 2/day, moderate leak amount 2, 3 pads/day Baseline 4 voids/day, 2 voids/night, degree or urgency 3, leaks 1/day, moderate leak amount n/a, 1 pads/day  Hold Plavix  for 5 days per cardiology prior to procedure.  Has not started tobacco cessation, reports 2 packs/day with cardiology visit  Reports using Norco 1-2 tabs/day.  Reports 1-2 glasses of wine/day.  Pad use: 3-4 pads/day down to 1 pad/day with >50% subjective improvement Bowel leakage 3x/week, compared to 2x/week prior to PNE  Per Dr. Kara [name] Score for Postoperative Pulmonary Complications patient is:  Intermediate risk  13.3% risk of in-hospital post-op pulmonary complications (composite including respiratory failure, respiratory infection, pleural effusion, atelectasis, pneumothorax, bronchospasm, aspiration pneumonitis)  Recommend using CPAP or Bipap in the PACU if needed as she wakes from anesthesia.  Prior visit: Gemtesa  cost prohibitive  Did not start low dose vaginal estrogen due to cost ($174/tube) Trospium  without relief Leaks 4-5 time(s) per days with activity, 2x/day since last visit Leaks 3x/week with urgency, now 1-2x/day Pad use: 3-4 pads/day.   Bowel leakage 2x/week, blood in stool evaluated by Marie Thigpen, NP (GI) 12/06/23 and recommended Anusol , sitz baths, fiber supplementation, increase water intake and activity. Recommended follow-up in 3 months and possible repeat colonoscopy if persistent symptoms.  Pulmonary  sarcoidosis with tobacco use Plavix  for HF with h/o CABG Chronic PRN Norco use by Dr. Auston for chronic pancreatitis and Creon  use. Tobacco use 1/2 [name]/day  Urodynamic Impression 12/25/23:  1. Sensation was normal; capacity was reduced at 2. Stress Incontinence was not demonstrated at normal pressures with low volume; 3. 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 Compliance  7.52mL/cmH20  4. Emptying was dysfunctional with a normal PVR 25mL, a sustained detrusor contraction present,  abdominal straining not present, normal urethral sphincter activity on EMG.  Pdet at Qmax was 65 cm of water.  Qmax was 9 mL/sec.  It was a interrupted pattern   HbA1C 6.9 per documentation in 02/28/23   Past Medical History: Patient  has a past medical history of (HFpEF) heart failure with preserved ejection fraction (HCC), Chronic pancreatitis (HCC), Coronary atherosclerosis of unspecified type of vessel, native or graft, Family history of pancreatic cancer, Headache(784.0), Hyperlipidemia LDL goal <70, Hypertension, Leiomyoma of uterus, unspecified, Loss of weight, Obesity, unspecified, OSA (obstructive sleep apnea), Pulmonary sarcoidosis, Pure hypercholesterolemia, Shortness of breath, and Type II diabetes mellitus (HCC).   Past Surgical History: She  has a past surgical history that includes Coronary artery bypass graft (~ 2000); Tubal ligation (1997); Hysteroscopy with novasure (N/A, 09/04/2012); Robotic assisted laparoscopic cholecystectomy-multi site (N/A, 01/14/2018); Coronary angioplasty with stent (09/2009); LEEP (N/A, 12/25/2018); Breast cyst aspiration; and Breast excisional biopsy.   Medications: She has a current medication list which includes the following prescription(s): atorvastatin , clopidogrel , conjugated estrogens , trelegy ellipta , hydrocodone -acetaminophen , hydrocortisone , losartan , metformin , and trospium  chloride, and the following Facility-Administered Medications:  lidocaine -epinephrine  and sodium bicarbonate .   Allergies: Patient is allergic to penicillins and lisinopril .   Social History: Patient  reports that she has been smoking cigarettes. She has a 16 [name]-year smoking history. She has  never used smokeless tobacco. She reports current alcohol use of about 8.0 standard drinks of alcohol per week. She reports that she does not use drugs.     OBJECTIVE     Physical Exam: Vitals:   03/30/24 1245  BP: 130/86  Pulse: 89   Gen: No apparent distress, A&O x 3.  Detailed Urogynecologic Evaluation:  Deferred.   Tegaderm removed. No drainage or blood on dressing. PNE leads removed bilaterally without difficulty. No active bleeding, erythema, abnormal discharge, or abnormal findings noted at insertion site. Patient tolerated procedure well.      ASSESSMENT AND PLAN    Marie [name] is a 59 y.o. with:  1. Incontinence of feces, unspecified fecal incontinence type   2. Tobacco use   3. Urinary incontinence, mixed   4. Nocturia    Incontinence of feces, unspecified fecal incontinence type Assessment & Plan: - h/o chronic pancreatitis with Creon  use, EtOH use, metformin  and opioid use, h/o SBO - unintended weight loss, blood in stool, tobacco use - Bristol VI stool - Treatment options include anti-diarrhea medication (loperamide/ Imodium OTC or prescription lomotil), fiber supplements, physical therapy, and possible sacral neuromodulation or surgery.   - encouraged fiber supplementation and follow-up with GI Dr. Dianna in 3 month for reassessment of blood in stool and colonoscopy ordered 12/06/23 - trial of Trospium  without relief - encouraged Kegel exercises - no change with PNE, discussed possible need for additional treatments after interstim placement and encouraged pelvic floor PT   Tobacco use Assessment & Plan: - We recommend tobacco cessation for 4-6 weeks prior to surgery to reduce surgical site infection, heart/lung complications,  and risk of delayed wound healing. - reviewed 13.3% risk of in-hospital post-op pulmonary complications  - using CPAP or Bipap in the PACU per Dr. Kara    Urinary incontinence, mixed Assessment & Plan: - prior bladder tack with resolution of urinary leakage > 15 years ago by Dr. Eveline - We discussed the symptoms of overactive bladder (OAB), which include urinary urgency, urinary frequency, nocturia, with or without urge incontinence.  While we do not know the exact etiology of OAB, several treatment options exist. We discussed management including behavioral therapy (decreasing bladder irritants, urge suppression strategies, timed voids, bladder retraining), physical therapy, medication; for refractory cases posterior tibial nerve stimulation, sacral neuromodulation, and intravesical botulinum toxin injection.  For anticholinergic medications, we discussed the potential side effects of anticholinergics including dry eyes, dry mouth, constipation, cognitive impairment and urinary retention. For Beta-3 agonist medication, we discussed the potential side effect of elevated blood pressure which is more likely to occur in individuals with uncontrolled hypertension. - trial of gemtesa  with samples provided. Rx changed to Trospium  due to cost without relief - discouraged Kegel exercises - For treatment of stress urinary incontinence,  non-surgical options include expectant management, weight loss, physical therapy, as well as a pessary.  Surgical options include a midurethral sling, Burch urethropexy, and transurethral injection of a bulking agent. - trial of low dose vaginal estrogen cost prohibitive, Rx premarin  and provided instructions for [facility] - urodynamics due to history of bladder tack showed reduced MCC , no SUI, DOI, pressure flow with 25mL PVR. Pdet at Qmax was 65 cm of water.  Qmax was 9 mL/sec.  Interrupted pattern - For refractory OAB we reviewed the procedure for intravesical  Botox injection with cystoscopy in the office and reviewed the risks, benefits and alternatives of treatment including but not limited to infection, need for self-catheterization and need for repeat  therapy.  We discussed that there is a 5-15% chance of needing to catheterize with Botox and that this usually resolves in a few months; however can persist for longer periods of time.  Typically Botox injections would need to be repeated every 3-12 months since this is not a permanent therapy.   We discussed the role of sacral neuromodulation and how it works. It requires a test phase, and documentation of bladder function via diary. After a successful test period, a permanent wire and generator are placed in the OR. The battery lasts 5 years on average and would need to be replaced surgically.  The goal of this therapy is at least a 50% improvement in symptoms. It is NOT realistic to expect a 100% cure.  We reviewed the fact that about 30% of patients fail the test phase and are not candidates for permanent generator placement.  We discussed the risk of infection and that the patient would have to switch to MRI compatible mode during imaging once the device is placed. There are two companies that provide this therapy: [employer] and [employer] that both offer rechargeable or non-rechargeable options. For all procedures, we discussed risks of bleeding, infection, damage to surrounding organs including bowel, bladder, blood vessels, ureters and nerves, need for further surgery, risk of postoperative urinary incontinence or retention with need to catheterize, recurrent prolapse, numbness and weakness at any body site, buttock pain, and the rarer risks of blood clot, heart attack, pneumonia, death.    We also discussed the role of percutaneous tibial nerve stimulation and how it works.  She understands it requires 12 weekly visits for temporary neuromodulation of the sacral nerve roots via the tibial nerve and that she may  then require continued tapered treatment.  - declines botox injections, PNE with > 50% improvement due to reduction of pad use. Pt desires to proceed with combined stage I and II interstim placement - Completed perioperative risk assessment with cardiology and pulm, reviewed tobacco cessation - pending anesthesia preop risk assessment    Nocturia Assessment & Plan: - avoid fluid intake 3 hours before bedtime - switch diuretic (e.g. HCTZ) dosing to 2pm - continue CPAP for sleep apnea - possibly due to CHF - no relief with Trospium  with minimal improvement after PNE - explained possible need for additional treatment after interstim placement. Patient desires to proceed.    The patient is a 59yo year old scheduled to undergo combined stage I and II interstim. Verbal consent was obtained for these procedures.  Plan: General Surgical Consent: The patient has previously been counseled on alternative treatments, and the decision by the patient and provider was to proceed with the procedure listed above.  For all procedures, there are risks of bleeding, infection, damage to surrounding organs including but not limited to bowel, bladder, blood vessels, ureters and nerves, and need for further surgery if an injury were to occur. These risks are all low with minimally invasive surgery.   There are risks of numbness and weakness at any body site or buttock/rectal pain.  It is possible that baseline pain can be worsened by surgery, either with or without mesh. Very rare risks include blood transfusion, blood clot, heart attack, pneumonia, or death.   There is also a risk of worsening of overactive bladder.   We discussed consent for blood products. Risks for blood transfusion include allergic reactions, other reactions that can affect different body organs and managed accordingly, transmission of infectious diseases such as HIV or Hepatitis. However,  the blood is screened. Patient consents for blood  products  Pre-operative instructions:  She was instructed to not take Aspirin /NSAIDs x 7days prior to surgery. She may continue her 81mg  ASA. Hold Plavix  5 days prior to procedure. Antibiotic prophylaxis was ordered as indicated.  Catheter use: n/a  Post-operative instructions:  She was provided with specific post-operative instructions, including precautions and signs/symptoms for which we would recommend contacting us , in addition to daytime and after-hours contact phone numbers. This was provided on a handout.   Post-operative medications: Prescriptions for tylenol , miralax , and oxycodone  will be sent to her pharmacy if LFTs are WNL. Discussed using tylenol  on a schedule to limit use of narcotics. Takes Norco 1-2 tabs/day.  Laboratory testing:  We will check labs: CBC, LFTs. Day of surgery UPT n/a  Preoperative clearance:  She does require surgical clearance.  Completed evaluation by Dr. Barbaraann (cardiology) and Dr. Kara (pulm). Will require anesthesia consult for risk assessment for MAC in prone position due to co-morbidities. Assess LFTs and CBC.  Post-operative follow-up:  A post-operative appointment will be made for 6 weeks from the date of surgery.   Patient will call the clinic or use MyChart should anything change or any new issues arise.   Time spent: I spent 23 minutes dedicated to the care of this patient on the date of this encounter to include pre-visit review of records, face-to-face time with the patient discussing mixed urinary incontinence, fecal incontinence, nocturia, alcohol use, tobacco use, and post visit documentation and ordering medication/ testing.   Lianne ONEIDA Gillis, MD

## 2024-03-30 NOTE — Assessment & Plan Note (Addendum)
-   We recommend tobacco cessation for 4-6 weeks prior to surgery to reduce surgical site infection, heart/lung complications, and risk of delayed wound healing. - reviewed 13.3% risk of in-hospital post-op pulmonary complications  - using CPAP or Bipap in the PACU per Dr. Kara

## 2024-03-30 NOTE — Assessment & Plan Note (Signed)
-   h/o chronic pancreatitis with Creon  use, EtOH use, metformin  and opioid use, h/o SBO - unintended weight loss, blood in stool, tobacco use - Bristol VI stool - Treatment options include anti-diarrhea medication (loperamide/ Imodium OTC or prescription lomotil), fiber supplements, physical therapy, and possible sacral neuromodulation or surgery.   - encouraged fiber supplementation and follow-up with GI Dr. Dianna in 3 month for reassessment of blood in stool and colonoscopy ordered 12/06/23 - trial of Trospium  without relief - encouraged Kegel exercises - no change with PNE, discussed possible need for additional treatments after interstim placement and encouraged pelvic floor PT

## 2024-03-30 NOTE — Assessment & Plan Note (Signed)
-   avoid fluid intake 3 hours before bedtime - switch diuretic (e.g. HCTZ) dosing to 2pm - continue CPAP for sleep apnea - possibly due to CHF - no relief with Trospium  with minimal improvement after PNE - explained possible need for additional treatment after interstim placement. Patient desires to proceed.

## 2024-03-30 NOTE — H&P (View-Only) (Signed)
 Marie [name] Return Visit  SUBJECTIVE  History of Present Illness: Marie [name] is a 59 y.o. female seen in follow-up for mixed urinary incontinence, nocturia, tobacco use, abnormal urinalysis, vaginal atrophy, fecal incontinence . Plan at last visit was continue CPAP, switch hydrochlorothiazide  to 2pm, low dose vaginal estrogen from [facility].   Patient reports around 60% improvement since PNE on 03/24/24 Baseline 7 voids/day, 3 voids/night, degree or urgency 4, leaks 2/day, moderate leak amount 2, 3 pads/day Baseline 4 voids/day, 2 voids/night, degree or urgency 3, leaks 1/day, moderate leak amount n/a, 1 pads/day  Hold Plavix  for 5 days per cardiology prior to procedure.  Has not started tobacco cessation, reports 2 packs/day with cardiology visit  Reports using Norco 1-2 tabs/day.  Reports 1-2 glasses of wine/day.  Pad use: 3-4 pads/day down to 1 pad/day with >50% subjective improvement Bowel leakage 3x/week, compared to 2x/week prior to PNE  Per Dr. Kara [name] Score for Postoperative Pulmonary Complications patient is:  Intermediate risk  13.3% risk of in-hospital post-op pulmonary complications (composite including respiratory failure, respiratory infection, pleural effusion, atelectasis, pneumothorax, bronchospasm, aspiration pneumonitis)  Recommend using CPAP or Bipap in the PACU if needed as she wakes from anesthesia.  Prior visit: Gemtesa  cost prohibitive  Did not start low dose vaginal estrogen due to cost ($174/tube) Trospium  without relief Leaks 4-5 time(s) per days with activity, 2x/day since last visit Leaks 3x/week with urgency, now 1-2x/day Pad use: 3-4 pads/day.   Bowel leakage 2x/week, blood in stool evaluated by Marie Thigpen, NP (GI) 12/06/23 and recommended Anusol , sitz baths, fiber supplementation, increase water intake and activity. Recommended follow-up in 3 months and possible repeat colonoscopy if persistent symptoms.  Pulmonary  sarcoidosis with tobacco use Plavix  for HF with h/o CABG Chronic PRN Norco use by Marie [name] for chronic pancreatitis and Creon  use. Tobacco use 1/2 [name]/day  Urodynamic Impression 12/25/23:  1. Sensation was normal; capacity was reduced at 2. Stress Incontinence was not demonstrated at normal pressures with low volume; 3. 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 Compliance  7.52mL/cmH20  4. Emptying was dysfunctional with a normal PVR 25mL, a sustained detrusor contraction present,  abdominal straining not present, normal urethral sphincter activity on EMG.  Pdet at Qmax was 65 cm of water.  Qmax was 9 mL/sec.  It was a interrupted pattern   HbA1C 6.9 per documentation in 02/28/23   Past Medical History: Patient  has a past medical history of (HFpEF) heart failure with preserved ejection fraction (HCC), Chronic pancreatitis (HCC), Coronary atherosclerosis of unspecified type of vessel, native or graft, Family history of pancreatic cancer, Headache(784.0), Hyperlipidemia LDL goal <70, Hypertension, Leiomyoma of uterus, unspecified, Loss of weight, Obesity, unspecified, OSA (obstructive sleep apnea), Pulmonary sarcoidosis, Pure hypercholesterolemia, Shortness of breath, and Type II diabetes mellitus (HCC).   Past Surgical History: She  has a past surgical history that includes Coronary artery bypass graft (~ 2000); Tubal ligation (1997); Hysteroscopy with novasure (N/A, 09/04/2012); Robotic assisted laparoscopic cholecystectomy-multi site (N/A, 01/14/2018); Coronary angioplasty with stent (09/2009); LEEP (N/A, 12/25/2018); Breast cyst aspiration; and Breast excisional biopsy.   Medications: She has a current medication list which includes the following prescription(s): atorvastatin , clopidogrel , conjugated estrogens , trelegy ellipta , hydrocodone -acetaminophen , hydrocortisone , losartan , metformin , and trospium  chloride, and the following Facility-Administered Medications:  lidocaine -epinephrine  and sodium bicarbonate .   Allergies: Patient is allergic to penicillins and lisinopril .   Social History: Patient  reports that she has been smoking cigarettes. She has a 16 [name]-year smoking history. She has  never used smokeless tobacco. She reports current alcohol use of about 8.0 standard drinks of alcohol per week. She reports that she does not use drugs.     OBJECTIVE     Physical Exam: Vitals:   03/30/24 1245  BP: 130/86  Pulse: 89   Gen: No apparent distress, A&O x 3.  Detailed Urogynecologic Evaluation:  Deferred.   Tegaderm removed. No drainage or blood on dressing. PNE leads removed bilaterally without difficulty. No active bleeding, erythema, abnormal discharge, or abnormal findings noted at insertion site. Patient tolerated procedure well.      ASSESSMENT AND PLAN    Marie [name] is a 59 y.o. with:  1. Incontinence of feces, unspecified fecal incontinence type   2. Tobacco use   3. Urinary incontinence, mixed   4. Nocturia    Incontinence of feces, unspecified fecal incontinence type Assessment & Plan: - h/o chronic pancreatitis with Creon  use, EtOH use, metformin  and opioid use, h/o SBO - unintended weight loss, blood in stool, tobacco use - [place] VI stool - Treatment options include anti-diarrhea medication (loperamide/ Imodium OTC or prescription lomotil), fiber supplements, physical therapy, and possible sacral neuromodulation or surgery.   - encouraged fiber supplementation and follow-up with GI Dr. Dianna in 3 month for reassessment of blood in stool and colonoscopy ordered 12/06/23 - trial of Trospium  without relief - encouraged Kegel exercises - no change with PNE, discussed possible need for additional treatments after interstim placement and encouraged pelvic floor PT   Tobacco use Assessment & Plan: - We recommend tobacco cessation for 4-6 weeks prior to surgery to reduce surgical site infection, heart/lung complications,  and risk of delayed wound healing. - reviewed 13.3% risk of in-hospital post-op pulmonary complications  - using CPAP or Bipap in the PACU per Dr. Kara    Urinary incontinence, mixed Assessment & Plan: - prior bladder tack with resolution of urinary leakage > 15 years ago by Dr. Eveline - We discussed the symptoms of overactive bladder (OAB), which include urinary urgency, urinary frequency, nocturia, with or without urge incontinence.  While we do not know the exact etiology of OAB, several treatment options exist. We discussed management including behavioral therapy (decreasing bladder irritants, urge suppression strategies, timed voids, bladder retraining), physical therapy, medication; for refractory cases posterior tibial nerve stimulation, sacral neuromodulation, and intravesical botulinum toxin injection.  For anticholinergic medications, we discussed the potential side effects of anticholinergics including dry eyes, dry mouth, constipation, cognitive impairment and urinary retention. For Beta-3 agonist medication, we discussed the potential side effect of elevated blood pressure which is more likely to occur in individuals with uncontrolled hypertension. - trial of gemtesa  with samples provided. Rx changed to Trospium  due to cost without relief - discouraged Kegel exercises - For treatment of stress urinary incontinence,  non-surgical options include expectant management, weight loss, physical therapy, as well as a pessary.  Surgical options include a midurethral sling, Burch urethropexy, and transurethral injection of a bulking agent. - trial of low dose vaginal estrogen cost prohibitive, Rx premarin  and provided instructions for [facility] - urodynamics due to history of bladder tack showed reduced MCC , no SUI, DOI, pressure flow with 25mL PVR. Pdet at Qmax was 65 cm of water.  Qmax was 9 mL/sec.  Interrupted pattern - For refractory OAB we reviewed the procedure for intravesical  Botox injection with cystoscopy in the office and reviewed the risks, benefits and alternatives of treatment including but not limited to infection, need for self-catheterization and need for repeat  therapy.  We discussed that there is a 5-15% chance of needing to catheterize with Botox and that this usually resolves in a few months; however can persist for longer periods of time.  Typically Botox injections would need to be repeated every 3-12 months since this is not a permanent therapy.   We discussed the role of sacral neuromodulation and how it works. It requires a test phase, and documentation of bladder function via diary. After a successful test period, a permanent wire and generator are placed in the OR. The battery lasts 5 years on average and would need to be replaced surgically.  The goal of this therapy is at least a 50% improvement in symptoms. It is NOT realistic to expect a 100% cure.  We reviewed the fact that about 30% of patients fail the test phase and are not candidates for permanent generator placement.  We discussed the risk of infection and that the patient would have to switch to MRI compatible mode during imaging once the device is placed. There are two companies that provide this therapy: [employer] and [employer] that both offer rechargeable or non-rechargeable options. For all procedures, we discussed risks of bleeding, infection, damage to surrounding organs including bowel, bladder, blood vessels, ureters and nerves, need for further surgery, risk of postoperative urinary incontinence or retention with need to catheterize, recurrent prolapse, numbness and weakness at any body site, buttock pain, and the rarer risks of blood clot, heart attack, pneumonia, death.    We also discussed the role of percutaneous tibial nerve stimulation and how it works.  She understands it requires 12 weekly visits for temporary neuromodulation of the sacral nerve roots via the tibial nerve and that she may  then require continued tapered treatment.  - declines botox injections, PNE with > 50% improvement due to reduction of pad use. Pt desires to proceed with combined stage I and II interstim placement - Completed perioperative risk assessment with cardiology and pulm, reviewed tobacco cessation - pending anesthesia preop risk assessment    Nocturia Assessment & Plan: - avoid fluid intake 3 hours before bedtime - switch diuretic (e.g. HCTZ) dosing to 2pm - continue CPAP for sleep apnea - possibly due to CHF - no relief with Trospium  with minimal improvement after PNE - explained possible need for additional treatment after interstim placement. Patient desires to proceed.    The patient is a 59yo year old scheduled to undergo combined stage I and II interstim. Verbal consent was obtained for these procedures.  Plan: General Surgical Consent: The patient has previously been counseled on alternative treatments, and the decision by the patient and provider was to proceed with the procedure listed above.  For all procedures, there are risks of bleeding, infection, damage to surrounding organs including but not limited to bowel, bladder, blood vessels, ureters and nerves, and need for further surgery if an injury were to occur. These risks are all low with minimally invasive surgery.   There are risks of numbness and weakness at any body site or buttock/rectal pain.  It is possible that baseline pain can be worsened by surgery, either with or without mesh. Very rare risks include blood transfusion, blood clot, heart attack, pneumonia, or death.   There is also a risk of worsening of overactive bladder.   We discussed consent for blood products. Risks for blood transfusion include allergic reactions, other reactions that can affect different body organs and managed accordingly, transmission of infectious diseases such as HIV or Hepatitis. However,  the blood is screened. Patient consents for blood  products  Pre-operative instructions:  She was instructed to not take Aspirin /NSAIDs x 7days prior to surgery. She may continue her 81mg  ASA. Hold Plavix  5 days prior to procedure. Antibiotic prophylaxis was ordered as indicated.  Catheter use: n/a  Post-operative instructions:  She was provided with specific post-operative instructions, including precautions and signs/symptoms for which we would recommend contacting us , in addition to daytime and after-hours contact phone numbers. This was provided on a handout.   Post-operative medications: Prescriptions for tylenol , miralax , and oxycodone  will be sent to her pharmacy if LFTs are WNL. Discussed using tylenol  on a schedule to limit use of narcotics. Takes Norco 1-2 tabs/day.  Laboratory testing:  We will check labs: CBC, LFTs. Day of surgery UPT n/a  Preoperative clearance:  She does require surgical clearance.  Completed evaluation by Dr. Barbaraann (cardiology) and Dr. Kara (pulm). Will require anesthesia consult for risk assessment for MAC in prone position due to co-morbidities. Assess LFTs and CBC.  Post-operative follow-up:  A post-operative appointment will be made for 6 weeks from the date of surgery.   Patient will call the clinic or use MyChart should anything change or any new issues arise.   Time spent: I spent 23 minutes dedicated to the care of this patient on the date of this encounter to include pre-visit review of records, face-to-face time with the patient discussing mixed urinary incontinence, fecal incontinence, nocturia, alcohol use, tobacco use, and post visit documentation and ordering medication/ testing.   Lianne ONEIDA Gillis, MD

## 2024-03-30 NOTE — Assessment & Plan Note (Addendum)
 - prior bladder tack with resolution of urinary leakage > 15 years ago by Dr. Eveline - We discussed the symptoms of overactive bladder (OAB), which include urinary urgency, urinary frequency, nocturia, with or without urge incontinence.  While we do not know the exact etiology of OAB, several treatment options exist. We discussed management including behavioral therapy (decreasing bladder irritants, urge suppression strategies, timed voids, bladder retraining), physical therapy, medication; for refractory cases posterior tibial nerve stimulation, sacral neuromodulation, and intravesical botulinum toxin injection.  For anticholinergic medications, we discussed the potential side effects of anticholinergics including dry eyes, dry mouth, constipation, cognitive impairment and urinary retention. For Beta-3 agonist medication, we discussed the potential side effect of elevated blood pressure which is more likely to occur in individuals with uncontrolled hypertension. - trial of gemtesa  with samples provided. Rx changed to Trospium  due to cost without relief - discouraged Kegel exercises - For treatment of stress urinary incontinence,  non-surgical options include expectant management, weight loss, physical therapy, as well as a pessary.  Surgical options include a midurethral sling, Burch urethropexy, and transurethral injection of a bulking agent. - trial of low dose vaginal estrogen cost prohibitive, Rx premarin  and provided instructions for [facility] - urodynamics due to history of bladder tack showed reduced MCC , no SUI, DOI, pressure flow with 25mL PVR. Pdet at Qmax was 65 cm of water.  Qmax was 9 mL/sec.  Interrupted pattern - For refractory OAB we reviewed the procedure for intravesical Botox injection with cystoscopy in the office and reviewed the risks, benefits and alternatives of treatment including but not limited to infection, need for self-catheterization and need for repeat therapy.  We  discussed that there is a 5-15% chance of needing to catheterize with Botox and that this usually resolves in a few months; however can persist for longer periods of time.  Typically Botox injections would need to be repeated every 3-12 months since this is not a permanent therapy.   We discussed the role of sacral neuromodulation and how it works. It requires a test phase, and documentation of bladder function via diary. After a successful test period, a permanent wire and generator are placed in the OR. The battery lasts 5 years on average and would need to be replaced surgically.  The goal of this therapy is at least a 50% improvement in symptoms. It is NOT realistic to expect a 100% cure.  We reviewed the fact that about 30% of patients fail the test phase and are not candidates for permanent generator placement.  We discussed the risk of infection and that the patient would have to switch to MRI compatible mode during imaging once the device is placed. There are two companies that provide this therapy: [employer] and [employer] that both offer rechargeable or non-rechargeable options. For all procedures, we discussed risks of bleeding, infection, damage to surrounding organs including bowel, bladder, blood vessels, ureters and nerves, need for further surgery, risk of postoperative urinary incontinence or retention with need to catheterize, recurrent prolapse, numbness and weakness at any body site, buttock pain, and the rarer risks of blood clot, heart attack, pneumonia, death.    We also discussed the role of percutaneous tibial nerve stimulation and how it works.  She understands it requires 12 weekly visits for temporary neuromodulation of the sacral nerve roots via the tibial nerve and that she may then require continued tapered treatment.  - declines botox injections, PNE with > 50% improvement due to reduction of pad use. Pt desires  to proceed with combined stage I and II interstim placement -  Completed perioperative risk assessment with cardiology and pulm, reviewed tobacco cessation - pending anesthesia preop risk assessment

## 2024-04-06 ENCOUNTER — Other Ambulatory Visit: Payer: Self-pay

## 2024-04-06 ENCOUNTER — Ambulatory Visit: Admitting: Pulmonary Disease

## 2024-04-06 ENCOUNTER — Encounter: Payer: Self-pay | Admitting: Pulmonary Disease

## 2024-04-06 ENCOUNTER — Encounter: Payer: Self-pay | Admitting: *Deleted

## 2024-04-06 VITALS — BP 120/86 | HR 104 | Ht 62.0 in | Wt 97.4 lb

## 2024-04-06 DIAGNOSIS — J432 Centrilobular emphysema: Secondary | ICD-10-CM

## 2024-04-06 DIAGNOSIS — J9611 Chronic respiratory failure with hypoxia: Secondary | ICD-10-CM

## 2024-04-06 DIAGNOSIS — F17210 Nicotine dependence, cigarettes, uncomplicated: Secondary | ICD-10-CM

## 2024-04-06 MED ORDER — ALBUTEROL SULFATE HFA 108 (90 BASE) MCG/ACT IN AERS
2.0000 | INHALATION_SPRAY | Freq: Four times a day (QID) | RESPIRATORY_TRACT | 6 refills | Status: AC | PRN
  Filled 2024-04-06: qty 6.7, 30d supply, fill #0

## 2024-04-06 MED ORDER — TRELEGY ELLIPTA 200-62.5-25 MCG/ACT IN AEPB
1.0000 | INHALATION_SPRAY | Freq: Every day | RESPIRATORY_TRACT | 6 refills | Status: AC
  Filled 2024-04-06: qty 60, 30d supply, fill #0

## 2024-04-06 NOTE — Patient Instructions (Signed)
 Continue Bipap nightly, goal is to use it for 4 hours or more per night  Use Trelegy ellipta  200mcg 1 puff daily  Continue to use albuterol  inhaler 1-2 puffs every 4-6 hours  Recommend smoking cessation with nicotine  replacement therapy - use 7mg  nicotine  patches daily - use 2mg  mini nicotine  lozenges as needed  Continue supplemental oxygen  with ambulation  Follow up in 6 months

## 2024-04-06 NOTE — Progress Notes (Signed)
 "  Established Patient Pulmonology Office Visit   Subjective:  Patient ID: Marie [name], female    DOB: 16-Mar-1966  MRN: [id number]  CC:  Chief Complaint  Patient presents with   Medical Management of Chronic Issues    Pt states     Discussed the use of AI scribe software for clinical note transcription with the patient, who gave verbal consent to proceed.  History of Present Illness Marie [name] is a 59 year old female, daily smoker with history of sarcoidosis, HFpEF, OSA not on CPAP, hypertension and DM II who returns to pulmonary clinic for emphysema.   She has been using a BiPAP machine for a couple of months after difficulties with CPAP. There were gaps in use in early December and around Christmas when she did not use the BiPAP. She sometimes wakes up with the full-face mask off. She does not use oxygen  with her device at night.  She uses Trelegy for COPD but not daily due to cost. She uses a nebulizer. She needs an updated prescription for her albuterol  inhaler as her current inhaler is low.  She smokes about a pack every three days, which is less than before. Her cough has decreased with reduced smoking. She is considering nicotine  patches and lozenges to further cut down.  She notes some leg swelling. She does not use albuterol  frequently.        ROS   Current Medications[1]      Objective:  BP 120/86   Pulse (!) 104   Ht 5' 2 (1.575 m) Comment: per pt  Wt 97 lb 6.4 oz (44.2 kg)   SpO2 91%   BMI 17.81 kg/m     Physical Exam Constitutional:      General: She is not in acute distress.    Appearance: Normal appearance.  Eyes:     General: No scleral icterus.    Conjunctiva/sclera: Conjunctivae normal.  Cardiovascular:     Rate and Rhythm: Normal rate and regular rhythm.  Pulmonary:     Breath sounds: No wheezing, rhonchi or rales.  Musculoskeletal:     Right lower leg: No edema.     Left lower leg: No edema.  Skin:    General: Skin is warm  and dry.  Neurological:     General: No focal deficit present.      Diagnostic Review:  Last CBC Lab Results  Component Value Date   WBC 6.0 02/21/2022   HGB 13.9 02/21/2022   HCT 44.1 02/21/2022   MCV 97.4 02/21/2022   MCH 30.7 02/21/2022   RDW 11.8 02/21/2022   PLT 192 02/21/2022   Last metabolic panel Lab Results  Component Value Date   GLUCOSE 396 (H) 02/21/2022   NA 137 02/21/2022   K 3.9 02/21/2022   CL 102 02/21/2022   CO2 27 02/21/2022   BUN 10 02/21/2022   CREATININE 0.74 02/21/2022   GFRNONAA >60 02/21/2022   CALCIUM  8.8 (L) 02/21/2022   PHOS 3.5 02/21/2022   PROT 7.7 02/18/2022   ALBUMIN 3.8 02/18/2022   BILITOT 0.8 02/18/2022   ALKPHOS 82 02/18/2022   AST 88 (H) 02/18/2022   ALT 35 02/18/2022   ANIONGAP 8 02/21/2022   CT Chest LCS 06/07/23 1. Lung-RADS 2, benign appearance or behavior. Continue annual screening with low-dose chest CT without contrast in 12 months. 2. Similar upper lobe predominant architectural distortion and volume loss with concurrent calcified thoracic adenopathy, likely related to sarcoidosis. 3. Chronic calcific pancreatitis. 4. Aortic  Atherosclerosis (ICD10-I70.0) and Emphysema (ICD10-J43.9). 5. Pulmonary artery enlargement suggests pulmonary arterial hypertension.     Assessment & Plan:   Assessment & Plan Centrilobular emphysema (HCC)  Orders:   Fluticasone -Umeclidin-Vilant (TRELEGY ELLIPTA ) 200-62.5-25 MCG/ACT AEPB; Inhale 1 puff into the lungs daily.   albuterol  (VENTOLIN  HFA) 108 (90 Base) MCG/ACT inhaler; Inhale 2 puffs into the lungs every 6 (six) hours as needed for wheezing or shortness of breath.   Ambulatory Referral for DME  Cigarette smoker     Chronic hypoxemic respiratory failure (HCC)  Orders:   Ambulatory Referral for DME   Assessment and Plan Assessment & Plan Centrilobular emphysema with chronic hypoxemic respiratory failure - Continue BiPAP therapy with minimum four hours per night. -  Sent refills for Trelegy and albuterol  inhaler. - Encouraged smoking cessation using nicotine  replacement therapy.  Nicotine  dependence, cigarettes Nicotine  dependence with current smoking of one pack every three days. Interested in reduction. Discussed nicotine  replacement options. - Recommended nicotine  replacement therapy with patches and 2 mg lozenges. - Advised purchasing nicotine  replacement products from pharmacies or online retailers.      Return in about 6 months (around 10/04/2024) for f/u visit Dr. Kara.   Dorn KATHEE Kara, MD     [1]  Current Outpatient Medications:    albuterol  (VENTOLIN  HFA) 108 (90 Base) MCG/ACT inhaler, Inhale 2 puffs into the lungs every 6 (six) hours as needed for wheezing or shortness of breath., Disp: 6.7 g, Rfl: 6   atorvastatin  (LIPITOR) 40 MG tablet, Take 1 tablet (40 mg total) by mouth daily for cholesterol., Disp: 100 tablet, Rfl: 1   clopidogrel  (PLAVIX ) 75 MG tablet, Take 1 tablet (75 mg total) by mouth daily., Disp: 100 tablet, Rfl: 1   conjugated estrogens  (PREMARIN ) vaginal cream, Place 1 Applicatorful vaginally 2 (two) times a week. Place 0.5g nightly for two weeks then twice a week after, Disp: 30 g, Rfl: 1   HYDROcodone -acetaminophen  (NORCO/VICODIN) 5-325 MG tablet, Take 1 tablet by mouth daily., Disp: 30 tablet, Rfl: 0   hydrocortisone  (ANUSOL -HC) 25 MG suppository, Place 1 suppository (25 mg total) rectally daily., Disp: 12 suppository, Rfl: 1   losartan  (COZAAR ) 50 MG tablet, Take 1 tablet (50 mg total) by mouth daily., Disp: 100 tablet, Rfl: 1   metFORMIN  (GLUCOPHAGE ) 500 MG tablet, Take 1 tablet (500 mg total) by mouth daily with breakfast., Disp: 100 tablet, Rfl: 1   Trospium  Chloride 60 MG CP24, Take 1 capsule (60 mg total) by mouth daily., Disp: 30 capsule, Rfl: 2   Fluticasone -Umeclidin-Vilant (TRELEGY ELLIPTA ) 200-62.5-25 MCG/ACT AEPB, Inhale 1 puff into the lungs daily., Disp: 60 each, Rfl: 6  Current Facility-Administered  Medications:    lidocaine -EPINEPHrine  (XYLOCAINE  W/EPI) 1 %-1:100000 (with pres) injection 20 mL, 20 mL, Intradermal, Once, Guadlupe Dull T, MD   sodium bicarbonate  injection 2 mL, 2 mL, Other, Once, Guadlupe Dull DASEN, MD  "

## 2024-04-06 NOTE — Assessment & Plan Note (Addendum)
" °  Orders:   Fluticasone -Umeclidin-Vilant (TRELEGY ELLIPTA ) 200-62.5-25 MCG/ACT AEPB; Inhale 1 puff into the lungs daily.   albuterol  (VENTOLIN  HFA) 108 (90 Base) MCG/ACT inhaler; Inhale 2 puffs into the lungs every 6 (six) hours as needed for wheezing or shortness of breath.   Ambulatory Referral for DME  "

## 2024-04-07 ENCOUNTER — Other Ambulatory Visit: Payer: Self-pay

## 2024-04-08 ENCOUNTER — Other Ambulatory Visit: Payer: Self-pay

## 2024-04-09 ENCOUNTER — Other Ambulatory Visit: Payer: Self-pay

## 2024-04-15 ENCOUNTER — Encounter (HOSPITAL_COMMUNITY): Payer: Self-pay | Admitting: Obstetrics

## 2024-04-15 ENCOUNTER — Other Ambulatory Visit: Payer: Self-pay

## 2024-04-15 NOTE — Progress Notes (Signed)
 Completed PAT phone visit with patient at this time.  Cardiologist - Dr.O'Neal-clearance in [facility] 01/08/2024 PPM/ICD -n/a  EKG -03/13/24  Stress Test - 2023 ECHO -03/13/24   Sleep Study/Sleep Apnea  Bipap-patient will bring with her day of surgery.   Diabetic type 2-patient aware not to take diabetic medication metformin  day of surgery  Last dose of GLP1 agonist-  n/a GLP1 instructions: n/a  Blood Thinner Instructions:Plavix  patient is aware to hold this 5 days prior to surgery.  Aspirin  Instructions:n/a  NPO after MN Patient denies shortness of breath, fever, cough and chest pain at PAT phone call appointment All instructions explained to the patient, with a verbal understanding.

## 2024-04-17 ENCOUNTER — Encounter (HOSPITAL_COMMUNITY)
Admission: RE | Disposition: A | Discharge status: Home / Self Care | Payer: Self-pay | Source: Home / Self Care | Attending: Obstetrics

## 2024-04-17 ENCOUNTER — Encounter (HOSPITAL_COMMUNITY): Payer: Self-pay | Admitting: Obstetrics

## 2024-04-17 ENCOUNTER — Other Ambulatory Visit: Payer: Self-pay

## 2024-04-17 ENCOUNTER — Telehealth: Payer: Self-pay | Admitting: *Deleted

## 2024-04-17 ENCOUNTER — Ambulatory Visit (HOSPITAL_COMMUNITY): Admitting: Anesthesiology

## 2024-04-17 ENCOUNTER — Ambulatory Visit (HOSPITAL_COMMUNITY)

## 2024-04-17 ENCOUNTER — Other Ambulatory Visit: Payer: Self-pay | Admitting: Obstetrics

## 2024-04-17 ENCOUNTER — Ambulatory Visit (HOSPITAL_COMMUNITY)
Admission: RE | Admit: 2024-04-17 | Discharge: 2024-04-17 | Disposition: A | Discharge status: Home / Self Care | Payer: Self-pay | Attending: Obstetrics | Admitting: Obstetrics

## 2024-04-17 DIAGNOSIS — G4733 Obstructive sleep apnea (adult) (pediatric): Secondary | ICD-10-CM | POA: Diagnosis not present

## 2024-04-17 DIAGNOSIS — K219 Gastro-esophageal reflux disease without esophagitis: Secondary | ICD-10-CM | POA: Diagnosis not present

## 2024-04-17 DIAGNOSIS — F17210 Nicotine dependence, cigarettes, uncomplicated: Secondary | ICD-10-CM | POA: Insufficient documentation

## 2024-04-17 DIAGNOSIS — G8918 Other acute postprocedural pain: Secondary | ICD-10-CM

## 2024-04-17 DIAGNOSIS — K861 Other chronic pancreatitis: Secondary | ICD-10-CM | POA: Insufficient documentation

## 2024-04-17 DIAGNOSIS — N3281 Overactive bladder: Secondary | ICD-10-CM | POA: Diagnosis not present

## 2024-04-17 DIAGNOSIS — I2510 Atherosclerotic heart disease of native coronary artery without angina pectoris: Secondary | ICD-10-CM | POA: Diagnosis not present

## 2024-04-17 DIAGNOSIS — R351 Nocturia: Secondary | ICD-10-CM | POA: Diagnosis not present

## 2024-04-17 DIAGNOSIS — R159 Full incontinence of feces: Secondary | ICD-10-CM | POA: Insufficient documentation

## 2024-04-17 DIAGNOSIS — Z79899 Other long term (current) drug therapy: Secondary | ICD-10-CM | POA: Diagnosis not present

## 2024-04-17 DIAGNOSIS — Z7984 Long term (current) use of oral hypoglycemic drugs: Secondary | ICD-10-CM | POA: Insufficient documentation

## 2024-04-17 DIAGNOSIS — Z01818 Encounter for other preprocedural examination: Secondary | ICD-10-CM

## 2024-04-17 DIAGNOSIS — I5032 Chronic diastolic (congestive) heart failure: Secondary | ICD-10-CM | POA: Diagnosis not present

## 2024-04-17 DIAGNOSIS — N3946 Mixed incontinence: Secondary | ICD-10-CM | POA: Diagnosis not present

## 2024-04-17 DIAGNOSIS — E119 Type 2 diabetes mellitus without complications: Secondary | ICD-10-CM | POA: Insufficient documentation

## 2024-04-17 DIAGNOSIS — I110 Hypertensive heart disease with heart failure: Secondary | ICD-10-CM | POA: Insufficient documentation

## 2024-04-17 DIAGNOSIS — D860 Sarcoidosis of lung: Secondary | ICD-10-CM | POA: Diagnosis not present

## 2024-04-17 DIAGNOSIS — I5033 Acute on chronic diastolic (congestive) heart failure: Secondary | ICD-10-CM

## 2024-04-17 DIAGNOSIS — Z951 Presence of aortocoronary bypass graft: Secondary | ICD-10-CM | POA: Diagnosis not present

## 2024-04-17 LAB — BASIC METABOLIC PANEL WITH GFR
Anion gap: 11 (ref 5–15)
BUN: 8 mg/dL (ref 6–20)
CO2: 24 mmol/L (ref 22–32)
Calcium: 9 mg/dL (ref 8.9–10.3)
Chloride: 105 mmol/L (ref 98–111)
Creatinine, Ser: 0.65 mg/dL (ref 0.44–1.00)
GFR, Estimated: >60 mL/min
Glucose, Bld: 71 mg/dL (ref 70–99)
Potassium: 4 mmol/L (ref 3.5–5.1)
Sodium: 139 mmol/L (ref 135–145)

## 2024-04-17 LAB — GLUCOSE, CAPILLARY
Glucose-Capillary: 76 mg/dL (ref 70–99)
Glucose-Capillary: 77 mg/dL (ref 70–99)

## 2024-04-17 MED ORDER — BUPIVACAINE HCL 0.25 % IJ SOLN
INTRAMUSCULAR | Status: DC | PRN
  Administered 2024-04-17: 20 mL

## 2024-04-17 MED ORDER — CHLORHEXIDINE GLUCONATE 0.12 % MT SOLN
OROMUCOSAL | Status: AC
  Filled 2024-04-17: qty 15

## 2024-04-17 MED ORDER — DROPERIDOL 2.5 MG/ML IJ SOLN: 0.6250 mg | Freq: Once | INTRAMUSCULAR | Status: DC | PRN

## 2024-04-17 MED ORDER — HEPARIN SODIUM (PORCINE) 5000 UNIT/ML IJ SOLN
5000.0000 [IU] | INTRAMUSCULAR | Status: AC
  Administered 2024-04-17: 5000 [IU] via SUBCUTANEOUS

## 2024-04-17 MED ORDER — DEXAMETHASONE SOD PHOSPHATE PF 10 MG/ML IJ SOLN
INTRAMUSCULAR | Status: AC
  Filled 2024-04-17: qty 1

## 2024-04-17 MED ORDER — LACTATED RINGERS IV SOLN: INTRAVENOUS | Status: DC

## 2024-04-17 MED ORDER — HEPARIN SODIUM (PORCINE) 5000 UNIT/ML IJ SOLN
INTRAMUSCULAR | Status: AC
  Filled 2024-04-17: qty 1

## 2024-04-17 MED ORDER — LIDOCAINE 2% (20 MG/ML) 5 ML SYRINGE
INTRAMUSCULAR | Status: AC
  Filled 2024-04-17: qty 5

## 2024-04-17 MED ORDER — GABAPENTIN 300 MG PO CAPS
300.0000 mg | ORAL_CAPSULE | ORAL | Status: AC
  Administered 2024-04-17: 300 mg via ORAL

## 2024-04-17 MED ORDER — ACETAMINOPHEN 500 MG PO TABS
1000.0000 mg | ORAL_TABLET | Freq: Once | ORAL | Status: AC
  Administered 2024-04-17: 1000 mg via ORAL

## 2024-04-17 MED ORDER — ACETAMINOPHEN 500 MG PO TABS: 1000.0000 mg | ORAL_TABLET | ORAL | Status: DC

## 2024-04-17 MED ORDER — PROPOFOL 10 MG/ML IV BOLUS
INTRAVENOUS | Status: DC | PRN
  Administered 2024-04-17: 20 mg via INTRAVENOUS

## 2024-04-17 MED ORDER — DEXAMETHASONE SOD PHOSPHATE PF 10 MG/ML IJ SOLN
INTRAMUSCULAR | Status: DC | PRN
  Administered 2024-04-17: 10 mg via INTRAVENOUS

## 2024-04-17 MED ORDER — CHLORHEXIDINE GLUCONATE 0.12 % MT SOLN
15.0000 mL | Freq: Once | OROMUCOSAL | Status: AC
  Administered 2024-04-17: 15 mL via OROMUCOSAL

## 2024-04-17 MED ORDER — FENTANYL CITRATE (PF) 100 MCG/2ML IJ SOLN
INTRAMUSCULAR | Status: DC | PRN
  Administered 2024-04-17: 50 ug via INTRAVENOUS
  Administered 2024-04-17 (×2): 25 ug via INTRAVENOUS

## 2024-04-17 MED ORDER — GABAPENTIN 300 MG PO CAPS
ORAL_CAPSULE | ORAL | Status: AC
  Filled 2024-04-17: qty 1

## 2024-04-17 MED ORDER — CEFAZOLIN SODIUM-DEXTROSE 2-4 GM/100ML-% IV SOLN
2.0000 g | INTRAVENOUS | Status: AC
  Administered 2024-04-17: 2 g via INTRAVENOUS

## 2024-04-17 MED ORDER — CEFAZOLIN SODIUM-DEXTROSE 2-4 GM/100ML-% IV SOLN
INTRAVENOUS | Status: AC
  Filled 2024-04-17: qty 100

## 2024-04-17 MED ORDER — FENTANYL CITRATE (PF) 100 MCG/2ML IJ SOLN: 25.0000 ug | INTRAMUSCULAR | Status: DC | PRN

## 2024-04-17 MED ORDER — FENTANYL CITRATE (PF) 100 MCG/2ML IJ SOLN
INTRAMUSCULAR | Status: AC
  Filled 2024-04-17: qty 2

## 2024-04-17 MED ORDER — ORAL CARE MOUTH RINSE: 15.0000 mL | Freq: Once | OROMUCOSAL | Status: AC

## 2024-04-17 MED ORDER — ACETAMINOPHEN 500 MG PO TABS
ORAL_TABLET | ORAL | Status: AC
  Filled 2024-04-17: qty 2

## 2024-04-17 MED ORDER — PROPOFOL 10 MG/ML IV BOLUS
INTRAVENOUS | Status: AC
  Filled 2024-04-17: qty 20

## 2024-04-17 MED ORDER — POVIDONE-IODINE 10 % EX SWAB: 2.0000 | Freq: Once | CUTANEOUS | Status: DC

## 2024-04-17 MED ORDER — MIDAZOLAM HCL (PF) 2 MG/2ML IJ SOLN
INTRAMUSCULAR | Status: DC | PRN
  Administered 2024-04-17: 2 mg via INTRAVENOUS

## 2024-04-17 MED ORDER — PROPOFOL 500 MG/50ML IV EMUL
INTRAVENOUS | Status: DC | PRN
  Administered 2024-04-17: 50 ug/kg/min via INTRAVENOUS

## 2024-04-17 MED ORDER — OXYCODONE HCL 5 MG PO TABS
5.0000 mg | ORAL_TABLET | ORAL | 0 refills | Status: AC | PRN
  Filled 2024-04-17: qty 10, 2d supply, fill #0

## 2024-04-17 MED ORDER — WATER FOR IRRIGATION, STERILE IR SOLN
Status: DC | PRN
  Administered 2024-04-17: 1000 mL

## 2024-04-17 MED ORDER — ONDANSETRON HCL 4 MG/2ML IJ SOLN
INTRAMUSCULAR | Status: AC
  Filled 2024-04-17: qty 2

## 2024-04-17 MED ORDER — ONDANSETRON HCL 4 MG/2ML IJ SOLN
INTRAMUSCULAR | Status: DC | PRN
  Administered 2024-04-17: 4 mg via INTRAVENOUS

## 2024-04-17 MED ORDER — MIDAZOLAM HCL 2 MG/2ML IJ SOLN
INTRAMUSCULAR | Status: AC
  Filled 2024-04-17: qty 2

## 2024-04-17 MED ORDER — GENTAMICIN SULFATE 40 MG/ML IJ SOLN
5.0000 mg/kg | INTRAVENOUS | Status: AC
  Administered 2024-04-17: 231.6 mg via INTRAVENOUS
  Filled 2024-04-17: qty 5.75

## 2024-04-17 MED ORDER — LIDOCAINE 2% (20 MG/ML) 5 ML SYRINGE
INTRAMUSCULAR | Status: DC | PRN
  Administered 2024-04-17: 60 mg via INTRAVENOUS

## 2024-04-17 NOTE — Op Note (Signed)
 Operative Note  Preoperative Diagnosis: Mixed urinary incontinence, nocturia, fecal incontinence  Postoperative Diagnosis: Mixed urinary incontinence, nocturia, fecal incontinence  Procedures performed:  Stage I and II sacral neuromodulation with fluoroscopy  Implants:  Implant Name Type Inv. Item Serial No. Manufacturer Lot No. LRB No. Used Action  STIMULATOR INTERSTIM 2X1.7X.3 - [id number] H Miscellaneous STIMULATOR INTERSTIM 2X1.7X.3 [id number] H [employer] NEUROMOD PAIN MGMT  N/A 1 Implanted  LEAD INTERSTIM 4.32 28 L - [id number] Lead LEAD INTERSTIM 4.32 28 L  [employer] NEUROMOD PAIN MGMT [id number] N/A 1 Implanted  [address] NEURO MED - [id number] M[employer] NEURO MED  [employer] NEUROMOD PAIN MGMT [id number] N/A 1 Implanted    Attending Surgeon: Lianne Leila Gillis, MD  Assistant Surgeon: n/a  Assistant: Arbutus Suzen HERO   Anesthesia: MAC  Findings: Bellows reflex and dorsiflexion of great toe noted from placement of left S3 lead. Placement into S3 foramen was confirmed on fluoroscopy Set to Program 3 at 0.71mA   Specimens: * No specimens in log *  Estimated blood loss: 5 mL  IV fluids: 900 mL  Urine output: 0 mL  Complications: none  Procedure in Detail: After informed consent was obtained, the patient was taken to the operating room where anesthesia was induced and found to be adequate. Patient was placed in the prone position with adequate padding to flatten the sacrum and under the shins to allow the toes to dangle freely. She was then prepped and draped in the usual sterile fashion. The C-arm was positioned in the AP and lateral positions to provide fluoroscopic mapping of the sacral region. Spinal needles in laid in parallel were used to identify the foramen landmarks.The S3 foramen was identified. Local injection of 0.25% bupivacaine  was injected down each side to the level of the sacrum. The foramen needle was placed at a 60 degree angle into the S3  foramen on the left. Proper needle depth was confirmed with fluoroscopy. This was repeated on the right side.  Position was confirmed by direct observation of bellowing and plantar flexion of the toe utilizing the external test stimulator. Optimal response was noted on the left side, so the right side needle was removed.   The foramen needle stylet was removed and guidewire was placed. An incision was made with an 11-blade scalpel and the introducer was placed over the guidewire. Proper depth was confirmed with fluoroscopy until the opaque mark of the dilator was seen on the anterior rim of the sacrum with fluoroscopy.  The obturator was unlocked and removed and the lead was placed through the introducer sheath to the first white line.  Position was checked fluoroscopically.  The lead was then introduced until three electrodes were visible below the sacrum.  Each electrode was tested for visualization of the bellows and plantar flexion of the great toe.  After satisfactory positioning was confirmed under fluoroscopy, the introducer sheath was retracted deploying the lead ties into the perisacral tissues under live fluoroscopy.    Attention was turned to the patient's right where anesthetic was injected at a point previously marked on the buttocks, below the iliac crest and below the belt line. The skin was injected with 0.25% bupivacaine  and an incision was made with a 15 blade scalpel. Further dissection was made into the subcutaneous tissue, allowing for a sufficient pocket for the generator.  A tunneling tool and tube were placed from the sacral lead subcutaneously to the incised pocket site.  The tunneling tool was removed and the lead was  sent through the tube and pulled out at the pocket site. The pocket was noted to be hemostatic. The generator was connected and placed in the pocket in a tyrx pouch. It was noted to have adequate room but also stayed in place. The incision was inspected and noted to be  hemostatic.  The subcutaneous layer was closed with running 2-0 Vicryl. The skin was closed with a subcuticular stitch of 4-0 monocryl. The incision from the neuroelectrode was closed with interrupted 4-0 monocryl.  Dermabond was applied over the incisions. The patient tolerated the procedure well and was taken to the recovery room in stable condition. Needle and sponge count was correct x2.

## 2024-04-17 NOTE — Discharge Instructions (Signed)
 POST OPERATIVE INSTRUCTIONS  General Instructions Recovery (not bed rest) will last approximately 6 weeks Walking is encouraged, but refrain from strenuous exercise/ housework/ heavy lifting. No lifting >10lbs  Bathing:  Do not submerge in water (NO swimming, bath, hot tub, etc) until after your postop visit. You can shower starting the day after surgery.  No driving until you are not taking narcotic pain medicine and until your pain is well enough controlled that you can slam on the breaks or make sudden movements if needed.   Taking your medications Please take your acetaminophen  and ibuprofen  on a schedule for the first 48 hours. Take 600mg  ibuprofen , then take 500mg  acetaminophen  3 hours later, then continue to alternate ibuprofen  and acetaminophen . That way you are taking each type of medication every 6 hours. Take the prescribed narcotic (oxycodone , tramadol , etc) as needed, with a maximum being every 4 hours.  Take a stool softener daily to keep your stools soft and preventing you from straining. If you have diarrhea, you decrease your stool softener. This is explained more below. We have prescribed you Miralax .  Reasons to Call the Nurse (see last page for phone numbers) Heavy Bleeding (changing your pad every 1-2 hours) Persistent nausea/vomiting Fever (100.4 degrees or more) Incision problems (pus or other fluid coming out, redness, warmth, increased pain)  Things to Expect After Surgery Mild to Moderate pain is normal during the first day or two after surgery. If prescribed, take Ibuprofen  or Tylenol  first and use the stronger medicine for break-through pain. You can overlap these medicines because they work differently.   Constipation   To Prevent Constipation:  Eat a well-balanced diet including protein, grains, fresh fruit and vegetables.  Drink plenty of fluids. Walk regularly.  Depending on specific instructions from your physician: take Miralax  daily and additionally you  can add a stool softener (colace/ docusate) and fiber supplement. Continue as long as you're on pain medications.   To Treat Constipation:  If you do not have a bowel movement in 2 days after surgery, you can take 2 Tbs of Milk of Magnesia 1-2 times a day until you have a bowel movement. If diarrhea occurs, decrease the amount or stop the laxative. If no results with Milk of Magnesia, you can drink a bottle of magnesium  citrate which you can purchase over the counter.  Fatigue:  This is a normal response to surgery and will improve with time.  Plan frequent rest periods throughout the day.  Gas Pain:  This is very common but can also be very painful! Drink warm liquids such as herbal teas, bouillon or soup. Walking will help you pass more gas.  Mylicon or Gas-X can be taken over the counter.  Leaking Urine:  Varying amounts of leakage may occur after surgery.  This should improve with time. Your bladder needs at least 3 months to recover from surgery. If you leak after surgery, be sure to mention this to your doctor at your post-op visit. If you were taking medications for overactive bladder prior to surgery, be sure to restart the medications immediately after surgery.  Incisions: If you have incisions on your abdomen, the skin glue will dissolve on its own over time. It is ok to gently rinse with soap and water over these incisions but do not scrub.  Return to Work  As work demands and recovery times vary widely, it is hard to predict when you will want to return to work. If you have a desk job with no  strenuous physical activity, and if you would like to return sooner than generally recommended, discuss this with your provider or call our office.   Post op concerns  For non-emergent issues, please call the Urogynecology Nurse. Please leave a message and someone will contact you within one business day.  You can also send a message through MyChart.   AFTER HOURS (After 5:00 PM and on weekends):   For urgent matters that cannot wait until the next business day. Call our office [phone] and connect to the doctor on call.  Please reserve this for important issues.   **FOR ANY TRUE EMERGENCY ISSUES CALL 911 OR GO TO THE NEAREST EMERGENCY ROOM.** Please inform our office or the doctor on call of any emergency.     APPOINTMENTS: Call [phone]

## 2024-04-17 NOTE — Transfer of Care (Signed)
 Immediate Anesthesia Transfer of Care Note  Patient: Marie [name]  Procedure(s) Performed: INSERTION, NEUROSTIMULATOR, SACRAL (Back) INSERTION, SACRAL NERVE STIMULATOR, INTERSTIM, STAGE 2 (Back)  Patient Location: PACU  Anesthesia Type:MAC  Level of Consciousness: awake, alert , and oriented  Airway & Oxygen  Therapy: Patient Spontanous Breathing and Patient connected to nasal cannula oxygen   Post-op Assessment: Report given to RN and Post -op Vital signs reviewed and stable  Post vital signs: Reviewed and stable  Last Vitals:  Vitals Value Taken Time  BP 118/83 04/17/24 11:00  Temp 36 C 04/17/24 10:56  Pulse 111 04/17/24 11:00  Resp 19 04/17/24 11:00  SpO2 93 % 04/17/24 11:00  Vitals shown include unfiled device data.  Last Pain:  Vitals:   04/17/24 0713  TempSrc: Oral  PainSc: 0-No pain      Patients Stated Pain Goal: 5 (04/17/24 0713)  Complications: No notable events documented.

## 2024-04-17 NOTE — Telephone Encounter (Signed)
 TC from pt, DOB  verified.  Pt states there was no pain medication sent to the pharmacy. I spoke with Dr Guadlupe. She stated she spoke to the husband who had thought prescription had already been picked up.  The patient stated nothing had been sent in.  Dr Guadlupe sent in a prescription of 10 tabs to [facility].  Pt informed KD CMA

## 2024-04-17 NOTE — Anesthesia Postprocedure Evaluation (Signed)
"   Anesthesia Post Note  Patient: Marie [name]  Procedure(s) Performed: INSERTION, NEUROSTIMULATOR, SACRAL (Back) INSERTION, SACRAL NERVE STIMULATOR, INTERSTIM, STAGE 2 (Back)     Patient location during evaluation: PACU Anesthesia Type: MAC Level of consciousness: awake and alert Pain management: pain level controlled Vital Signs Assessment: post-procedure vital signs reviewed and stable Respiratory status: spontaneous breathing, nonlabored ventilation, respiratory function stable and patient connected to nasal cannula oxygen  Cardiovascular status: stable and blood pressure returned to baseline Postop Assessment: no apparent nausea or vomiting Anesthetic complications: no   No notable events documented.  Last Vitals:  Vitals:   04/17/24 1130 04/17/24 1145  BP: 134/79 119/81  Pulse: 87 91  Resp: 13 17  Temp:  (!) 36.1 C  SpO2: 94% 92%    Last Pain:  Vitals:   04/17/24 1145  TempSrc:   PainSc: 0-No pain                 Cordella P Alden Feagan      "

## 2024-04-17 NOTE — Anesthesia Preprocedure Evaluation (Addendum)
"                                    Anesthesia Evaluation  Patient identified by MRN, date of birth, ID band Patient awake    Reviewed: Allergy & Precautions, NPO status , Patient's Chart, lab work & pertinent test results  Airway Mallampati: II  TM Distance: >3 FB Neck ROM: Full    Dental no notable dental hx. (+) Edentulous Upper, Edentulous Lower   Pulmonary sleep apnea , COPD, Current Smoker and Patient abstained from smoking.   Pulmonary exam normal        Cardiovascular hypertension, Pt. on medications + CAD, + CABG and +CHF   Rhythm:Regular Rate:Normal     Neuro/Psych  Headaches  negative psych ROS   GI/Hepatic Neg liver ROS,GERD  ,,  Endo/Other  diabetes, Type 2, Oral Hypoglycemic Agents    Renal/GU negative Renal ROS   Urinary incontinence     Musculoskeletal negative musculoskeletal ROS (+)    Abdominal Normal abdominal exam  (+)   Peds  Hematology   Anesthesia Other Findings   Reproductive/Obstetrics                              Anesthesia Physical Anesthesia Plan  ASA: 3  Anesthesia Plan: MAC   Post-op Pain Management:    Induction: Intravenous  PONV Risk Score and Plan: 1 and Propofol  infusion, Treatment may vary due to age or medical condition, Ondansetron , Dexamethasone  and Midazolam   Airway Management Planned: Simple Face Mask and Nasal Cannula  Additional Equipment: None  Intra-op Plan:   Post-operative Plan:   Informed Consent: I have reviewed the patients History and Physical, chart, labs and discussed the procedure including the risks, benefits and alternatives for the proposed anesthesia with the patient or authorized representative who has indicated his/her understanding and acceptance.     Dental advisory given  Plan Discussed with:   Anesthesia Plan Comments:          Anesthesia Quick Evaluation  "

## 2024-04-17 NOTE — Interval H&P Note (Signed)
 History and Physical Interval Note:  04/17/2024 7:29 AM  Marie [name]  has presented today for surgery, with the diagnosis of Urinary incontinece mixed.  The various methods of treatment have been discussed with the patient and family. After consideration of risks, benefits and other options for treatment, the patient has consented to  Procedures: INSERTION, NEUROSTIMULATOR, SACRAL (N/A) INSERTION, SACRAL NERVE STIMULATOR, INTERSTIM, STAGE 2 (N/A) as a surgical intervention.  The patient's history has been reviewed, patient examined, no change in status, stable for surgery.  I have reviewed the patient's chart and labs.  Questions were answered to the patient's satisfaction.     [name]

## 2024-04-17 NOTE — Progress Notes (Signed)
 Rx oxycodone  sent to [facility].

## 2024-04-21 ENCOUNTER — Ambulatory Visit (HOSPITAL_COMMUNITY)
Admission: RE | Admit: 2024-04-21 | Discharge: 2024-04-21 | Disposition: A | Discharge status: Home / Self Care | Source: Ambulatory Visit

## 2024-04-21 DIAGNOSIS — I25810 Atherosclerosis of coronary artery bypass graft(s) without angina pectoris: Secondary | ICD-10-CM | POA: Diagnosis present

## 2024-04-21 DIAGNOSIS — I2510 Atherosclerotic heart disease of native coronary artery without angina pectoris: Secondary | ICD-10-CM

## 2024-04-21 LAB — ECHOCARDIOGRAM COMPLETE
Area-P 1/2: 4.89 cm2
S' Lateral: 2.42 cm

## 2024-04-22 ENCOUNTER — Ambulatory Visit (HOSPITAL_COMMUNITY)

## 2024-04-22 ENCOUNTER — Ambulatory Visit: Payer: Self-pay | Admitting: Cardiovascular Disease

## 2024-04-24 ENCOUNTER — Encounter (HOSPITAL_COMMUNITY): Payer: Self-pay | Admitting: Obstetrics

## 2024-05-13 ENCOUNTER — Encounter: Admitting: Obstetrics

## 2024-06-08 ENCOUNTER — Other Ambulatory Visit

## 2024-06-12 ENCOUNTER — Ambulatory Visit: Payer: Self-pay | Admitting: Cardiovascular Disease
